# Patient Record
Sex: Female | Born: 1970 | Hispanic: No | Smoking: Former smoker
Health system: Southern US, Community
[De-identification: ages and names within clinical notes are randomized; demographics above are authoritative.]

## PROBLEM LIST (undated history)

## (undated) DIAGNOSIS — R000 Tachycardia, unspecified: Secondary | ICD-10-CM

## (undated) DIAGNOSIS — G5600 Carpal tunnel syndrome, unspecified upper limb: Secondary | ICD-10-CM

## (undated) DIAGNOSIS — J189 Pneumonia, unspecified organism: Secondary | ICD-10-CM

## (undated) DIAGNOSIS — M25562 Pain in left knee: Secondary | ICD-10-CM

## (undated) DIAGNOSIS — L732 Hidradenitis suppurativa: Secondary | ICD-10-CM

## (undated) DIAGNOSIS — R030 Elevated blood-pressure reading, without diagnosis of hypertension: Secondary | ICD-10-CM

## (undated) DIAGNOSIS — R51 Headache: Secondary | ICD-10-CM

## (undated) DIAGNOSIS — M1990 Unspecified osteoarthritis, unspecified site: Secondary | ICD-10-CM

## (undated) DIAGNOSIS — M79622 Pain in left upper arm: Secondary | ICD-10-CM

## (undated) DIAGNOSIS — T7840XA Allergy, unspecified, initial encounter: Secondary | ICD-10-CM

## (undated) DIAGNOSIS — I10 Essential (primary) hypertension: Secondary | ICD-10-CM

## (undated) DIAGNOSIS — N644 Mastodynia: Secondary | ICD-10-CM

## (undated) DIAGNOSIS — M419 Scoliosis, unspecified: Secondary | ICD-10-CM

## (undated) DIAGNOSIS — F419 Anxiety disorder, unspecified: Secondary | ICD-10-CM

## (undated) DIAGNOSIS — M545 Low back pain, unspecified: Secondary | ICD-10-CM

## (undated) DIAGNOSIS — R519 Headache, unspecified: Secondary | ICD-10-CM

## (undated) DIAGNOSIS — N61 Mastitis without abscess: Secondary | ICD-10-CM

## (undated) DIAGNOSIS — R079 Chest pain, unspecified: Secondary | ICD-10-CM

## (undated) DIAGNOSIS — G8929 Other chronic pain: Secondary | ICD-10-CM

## (undated) DIAGNOSIS — F4321 Adjustment disorder with depressed mood: Secondary | ICD-10-CM

## (undated) HISTORY — DX: Elevated blood-pressure reading, without diagnosis of hypertension: R03.0

## (undated) HISTORY — DX: Essential (primary) hypertension: I10

## (undated) HISTORY — DX: Tachycardia, unspecified: R00.0

## (undated) HISTORY — PX: RADIOFREQUENCY ABLATION: SHX2290

## (undated) HISTORY — PX: DILATION AND CURETTAGE OF UTERUS: SHX78

## (undated) HISTORY — DX: Anxiety disorder, unspecified: F41.9

## (undated) HISTORY — DX: Mastodynia: N64.4

---
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
## 1990-07-25 HISTORY — PX: KNEE ARTHROSCOPY: SHX127

## 2000-10-18 ENCOUNTER — Inpatient Hospital Stay (HOSPITAL_COMMUNITY): Admission: AD | Admit: 2000-10-18 | Discharge: 2000-10-18 | Payer: Self-pay | Admitting: Obstetrics

## 2001-03-18 ENCOUNTER — Encounter: Payer: Self-pay | Admitting: Emergency Medicine

## 2001-03-18 ENCOUNTER — Emergency Department (HOSPITAL_COMMUNITY): Admission: EM | Admit: 2001-03-18 | Discharge: 2001-03-18 | Payer: Self-pay | Admitting: Emergency Medicine

## 2001-11-02 ENCOUNTER — Other Ambulatory Visit: Admission: RE | Admit: 2001-11-02 | Discharge: 2001-11-02 | Payer: Self-pay | Admitting: *Deleted

## 2001-12-04 ENCOUNTER — Inpatient Hospital Stay (HOSPITAL_COMMUNITY): Admission: AD | Admit: 2001-12-04 | Discharge: 2001-12-04 | Payer: Self-pay | Admitting: Obstetrics and Gynecology

## 2001-12-14 ENCOUNTER — Inpatient Hospital Stay (HOSPITAL_COMMUNITY): Admission: AD | Admit: 2001-12-14 | Discharge: 2001-12-14 | Payer: Self-pay | Admitting: Obstetrics and Gynecology

## 2001-12-15 ENCOUNTER — Inpatient Hospital Stay (HOSPITAL_COMMUNITY): Admission: AD | Admit: 2001-12-15 | Discharge: 2001-12-15 | Payer: Self-pay | Admitting: Obstetrics and Gynecology

## 2002-01-04 ENCOUNTER — Observation Stay (HOSPITAL_COMMUNITY): Admission: AD | Admit: 2002-01-04 | Discharge: 2002-01-05 | Payer: Self-pay | Admitting: Obstetrics and Gynecology

## 2002-01-05 ENCOUNTER — Encounter: Payer: Self-pay | Admitting: Obstetrics and Gynecology

## 2002-02-24 ENCOUNTER — Inpatient Hospital Stay (HOSPITAL_COMMUNITY): Admission: AD | Admit: 2002-02-24 | Discharge: 2002-02-27 | Payer: Self-pay | Admitting: *Deleted

## 2002-02-25 ENCOUNTER — Encounter: Payer: Self-pay | Admitting: *Deleted

## 2002-04-08 ENCOUNTER — Other Ambulatory Visit: Admission: RE | Admit: 2002-04-08 | Discharge: 2002-04-08 | Payer: Self-pay | Admitting: *Deleted

## 2003-04-20 ENCOUNTER — Emergency Department (HOSPITAL_COMMUNITY): Admission: EM | Admit: 2003-04-20 | Discharge: 2003-04-20 | Payer: Self-pay

## 2006-11-20 ENCOUNTER — Emergency Department (HOSPITAL_COMMUNITY): Admission: EM | Admit: 2006-11-20 | Discharge: 2006-11-20 | Payer: Self-pay | Admitting: Emergency Medicine
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
## 2007-09-18 ENCOUNTER — Emergency Department (HOSPITAL_COMMUNITY): Admission: EM | Admit: 2007-09-18 | Discharge: 2007-09-18 | Payer: Self-pay | Admitting: Family Medicine

## 2007-09-18 IMAGING — CR DG CHEST 2V
2 series · 2 of 2 positions shown · non-contrast
Comparison: None.

CLINICAL DATA: 36-year-old, cough and fever for 4 days.
 CHEST - 2 VIEW:

[view not recorded (1 of 2)]
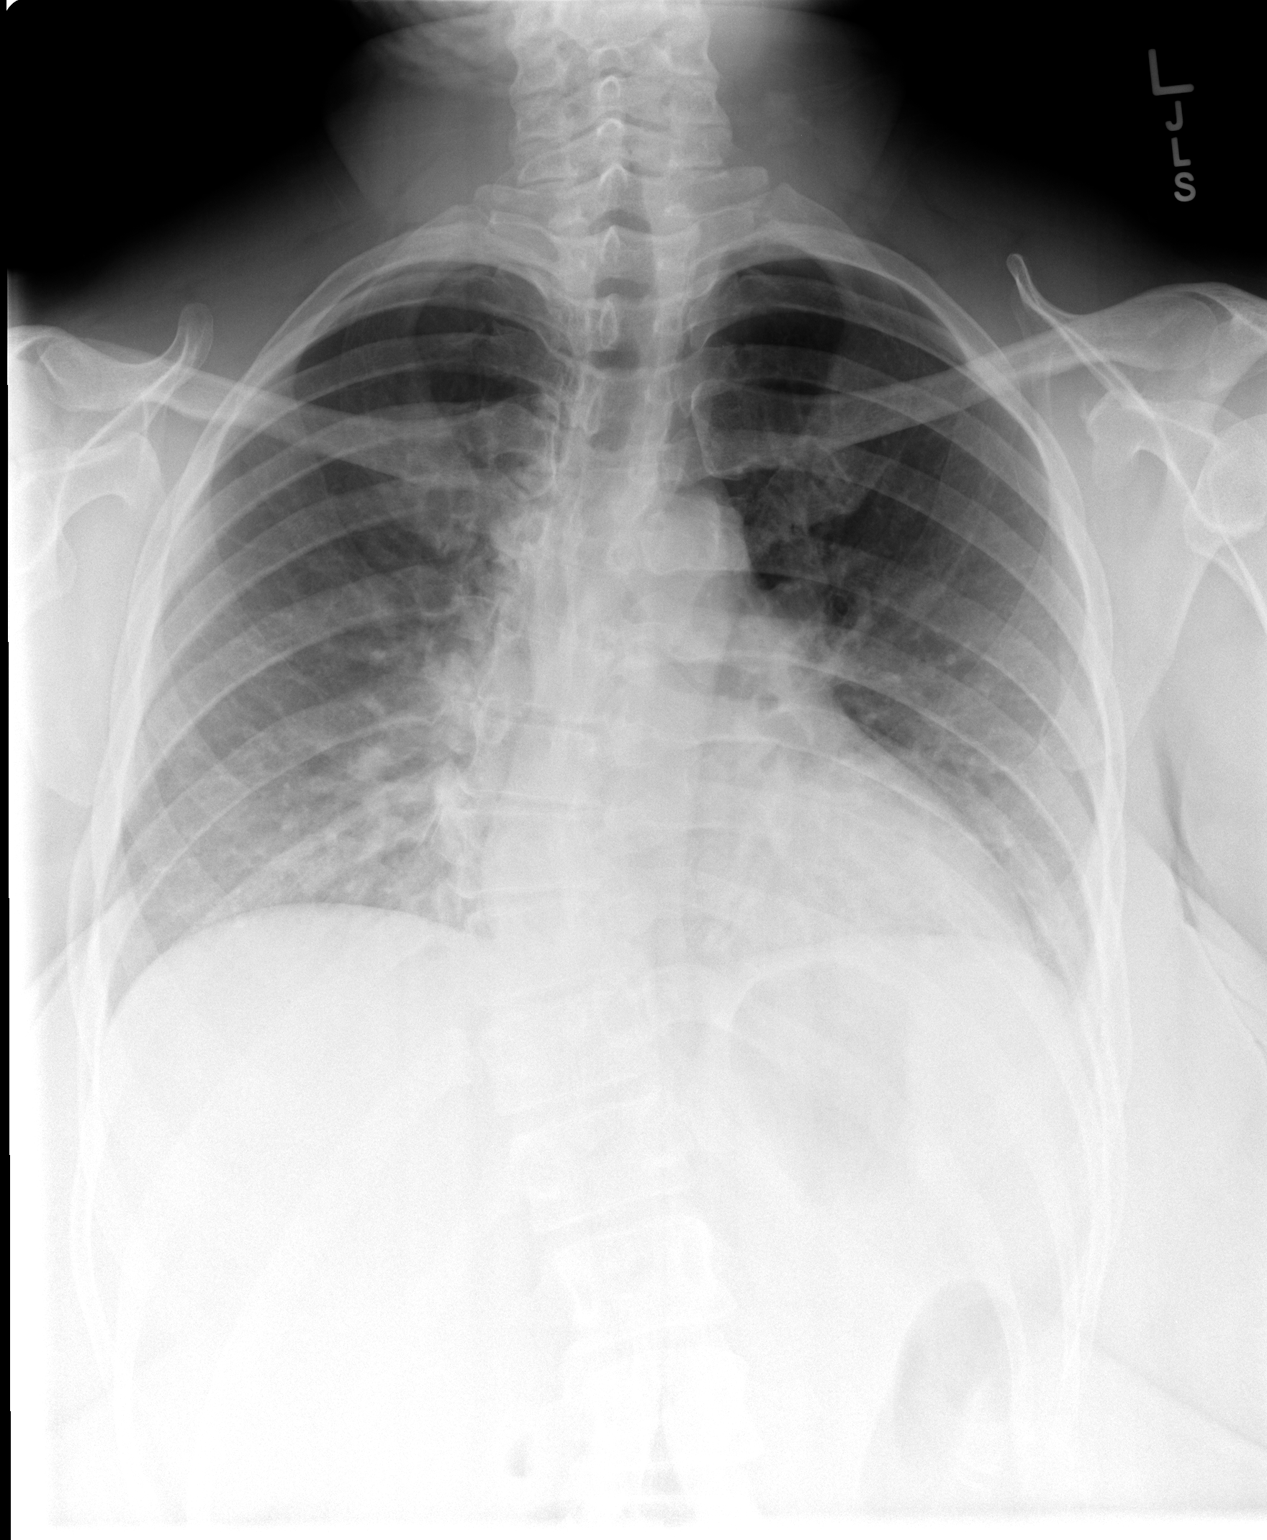

[view not recorded (2 of 2)]
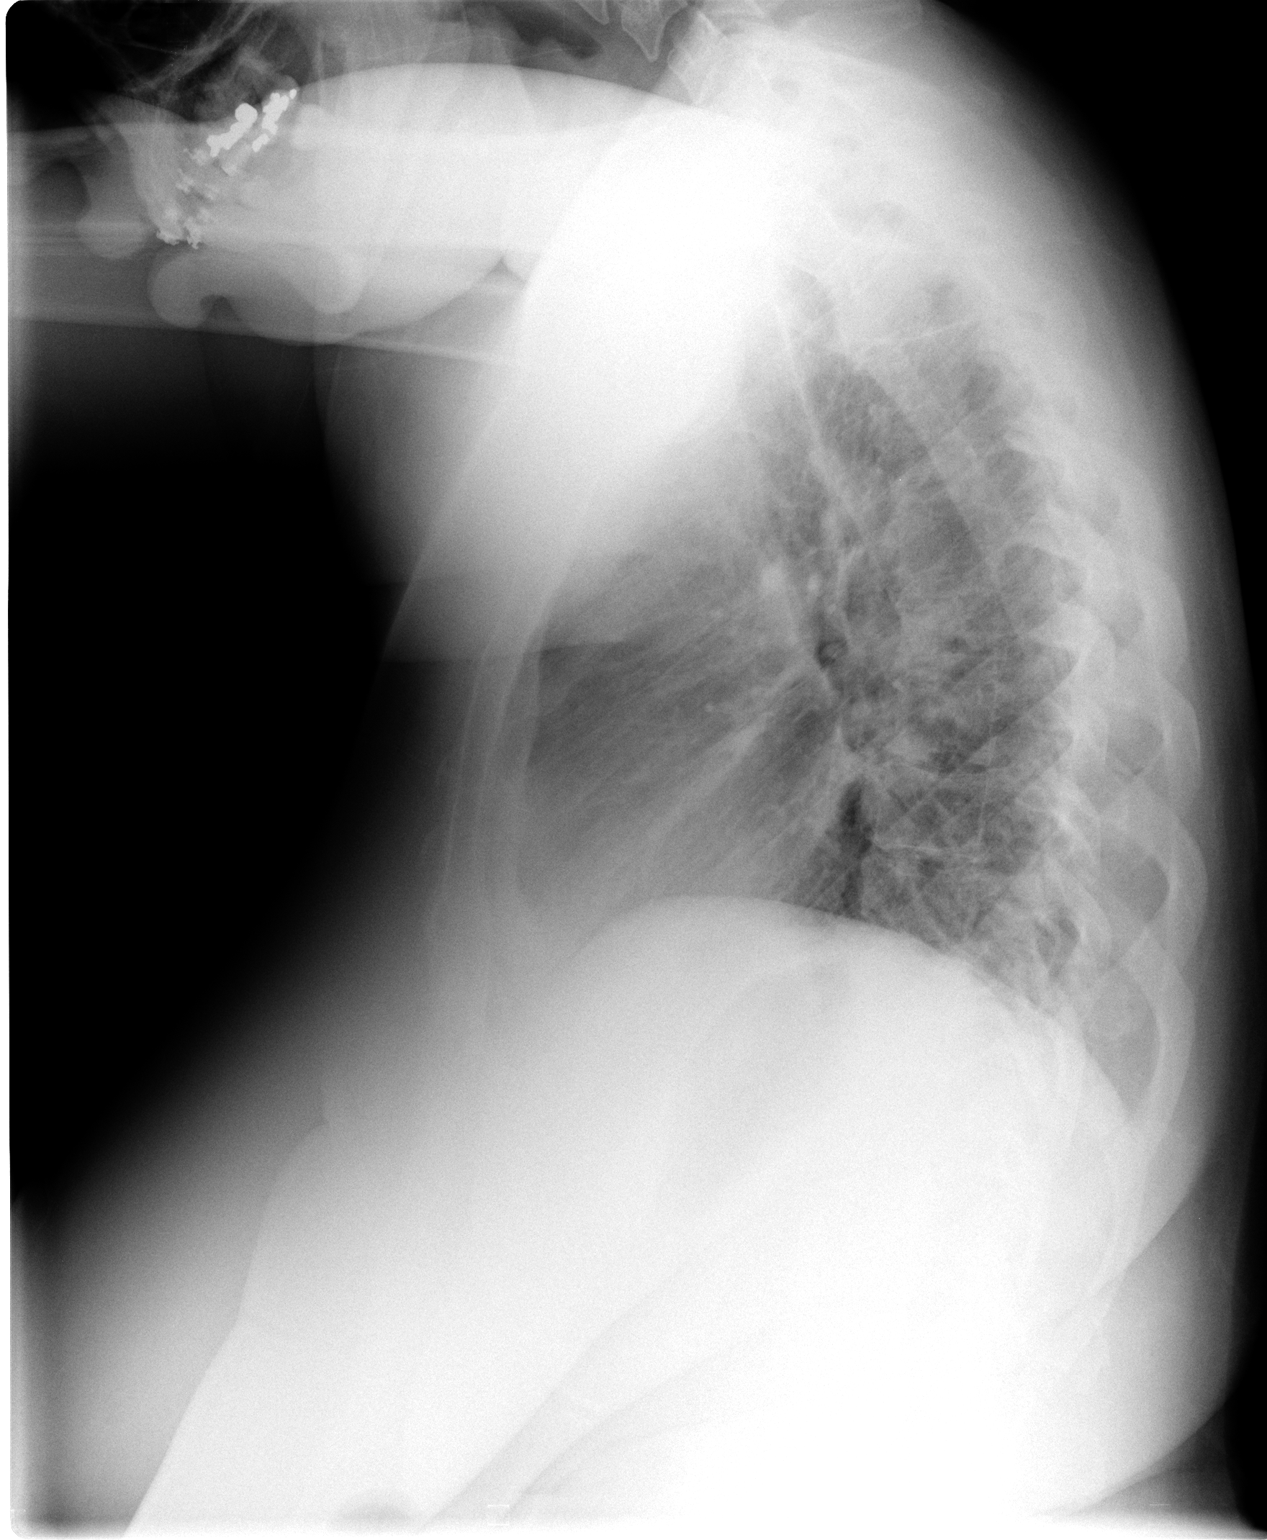

[2 of 2 positions shown; findings below may reference images not displayed]

FINDINGS: Exam is limited by a low volume inspiration.  There is vascular crowding and bibasilar atelectasis.  There is also mild peribronchial thickening and could not exclude bronchitis.  No definite focal pulmonary infiltrates.  No pleural effusions. Scoliotic curvature of the thoracolumbar spine.
IMPRESSION: Low volume chest film with vascular crowding and atelectasis.  Possible changes of bronchitis.   No definite infiltrates or effusions.

## 2008-04-24 LAB — CONVERTED CEMR LAB: Pap Smear: NORMAL

## 2009-06-24 ENCOUNTER — Emergency Department (HOSPITAL_BASED_OUTPATIENT_CLINIC_OR_DEPARTMENT_OTHER): Admission: EM | Admit: 2009-06-24 | Discharge: 2009-06-24 | Payer: Self-pay | Admitting: Emergency Medicine

## 2009-11-06 ENCOUNTER — Ambulatory Visit: Payer: Self-pay | Admitting: Diagnostic Radiology

## 2009-11-06 ENCOUNTER — Emergency Department (HOSPITAL_BASED_OUTPATIENT_CLINIC_OR_DEPARTMENT_OTHER): Admission: EM | Admit: 2009-11-06 | Discharge: 2009-11-06 | Payer: Self-pay | Admitting: Emergency Medicine

## 2009-11-06 IMAGING — CR DG TOE GREAT 2+V*L*
3 series · 3 of 3 positions shown · non-contrast
Comparison: None.

CLINICAL DATA: Left great toe pain following an injury.

LEFT TOE - 2+ VIEW

[t toes ap left]
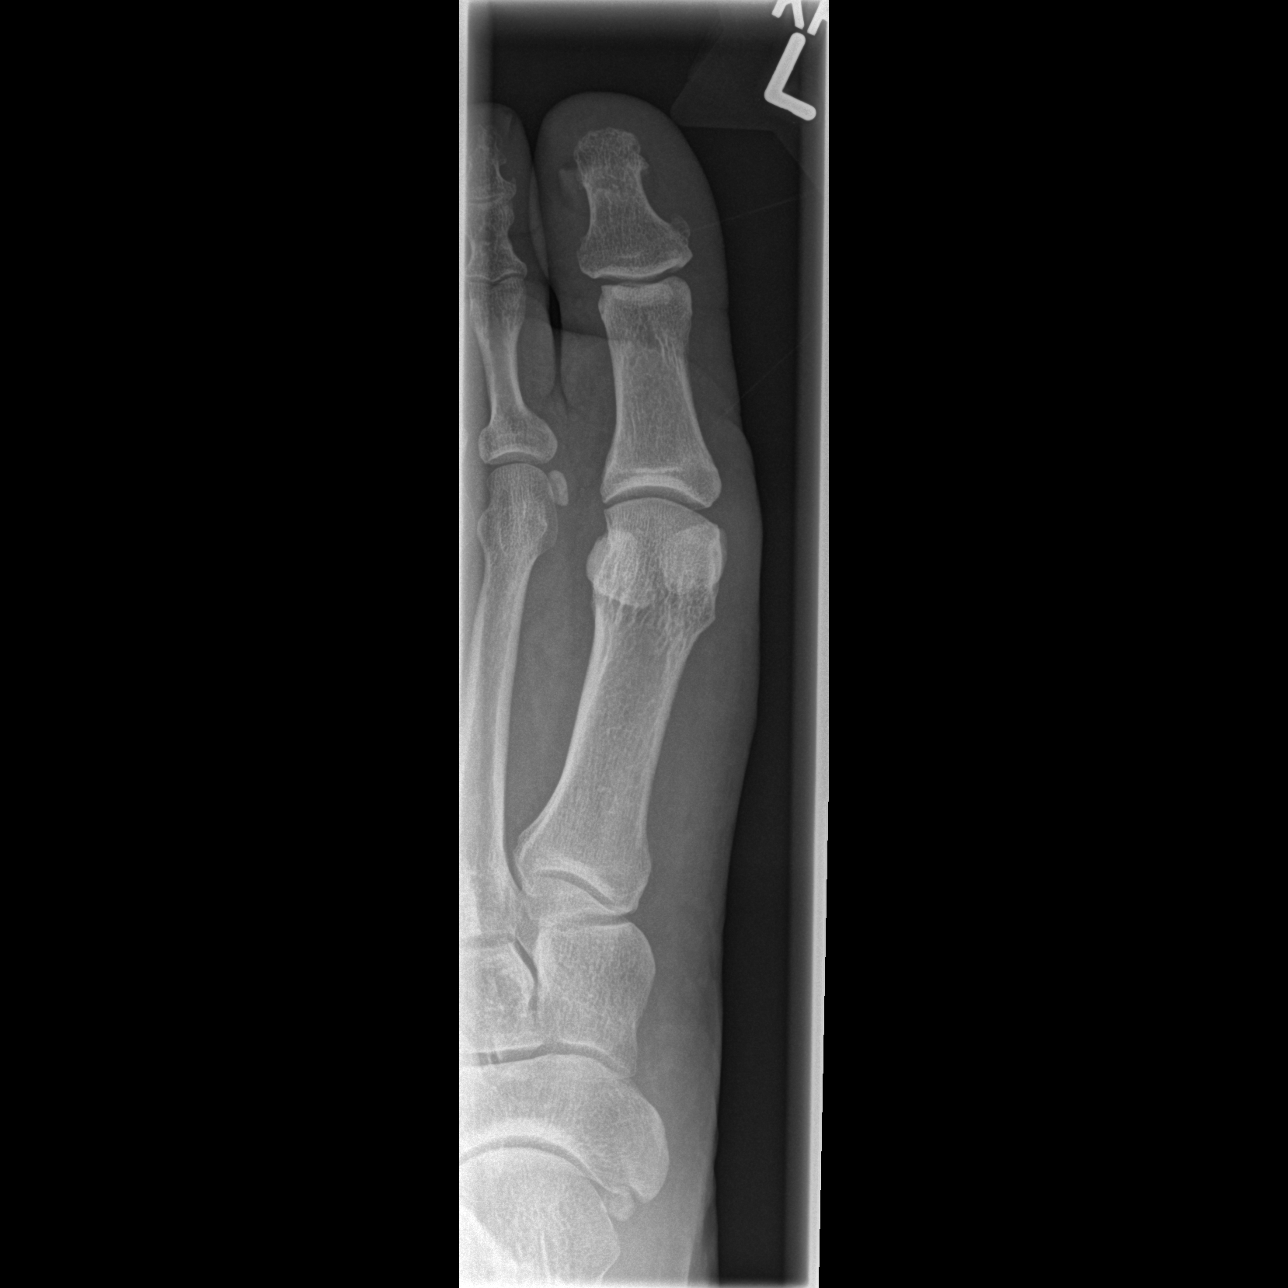

[t toes oblique left]
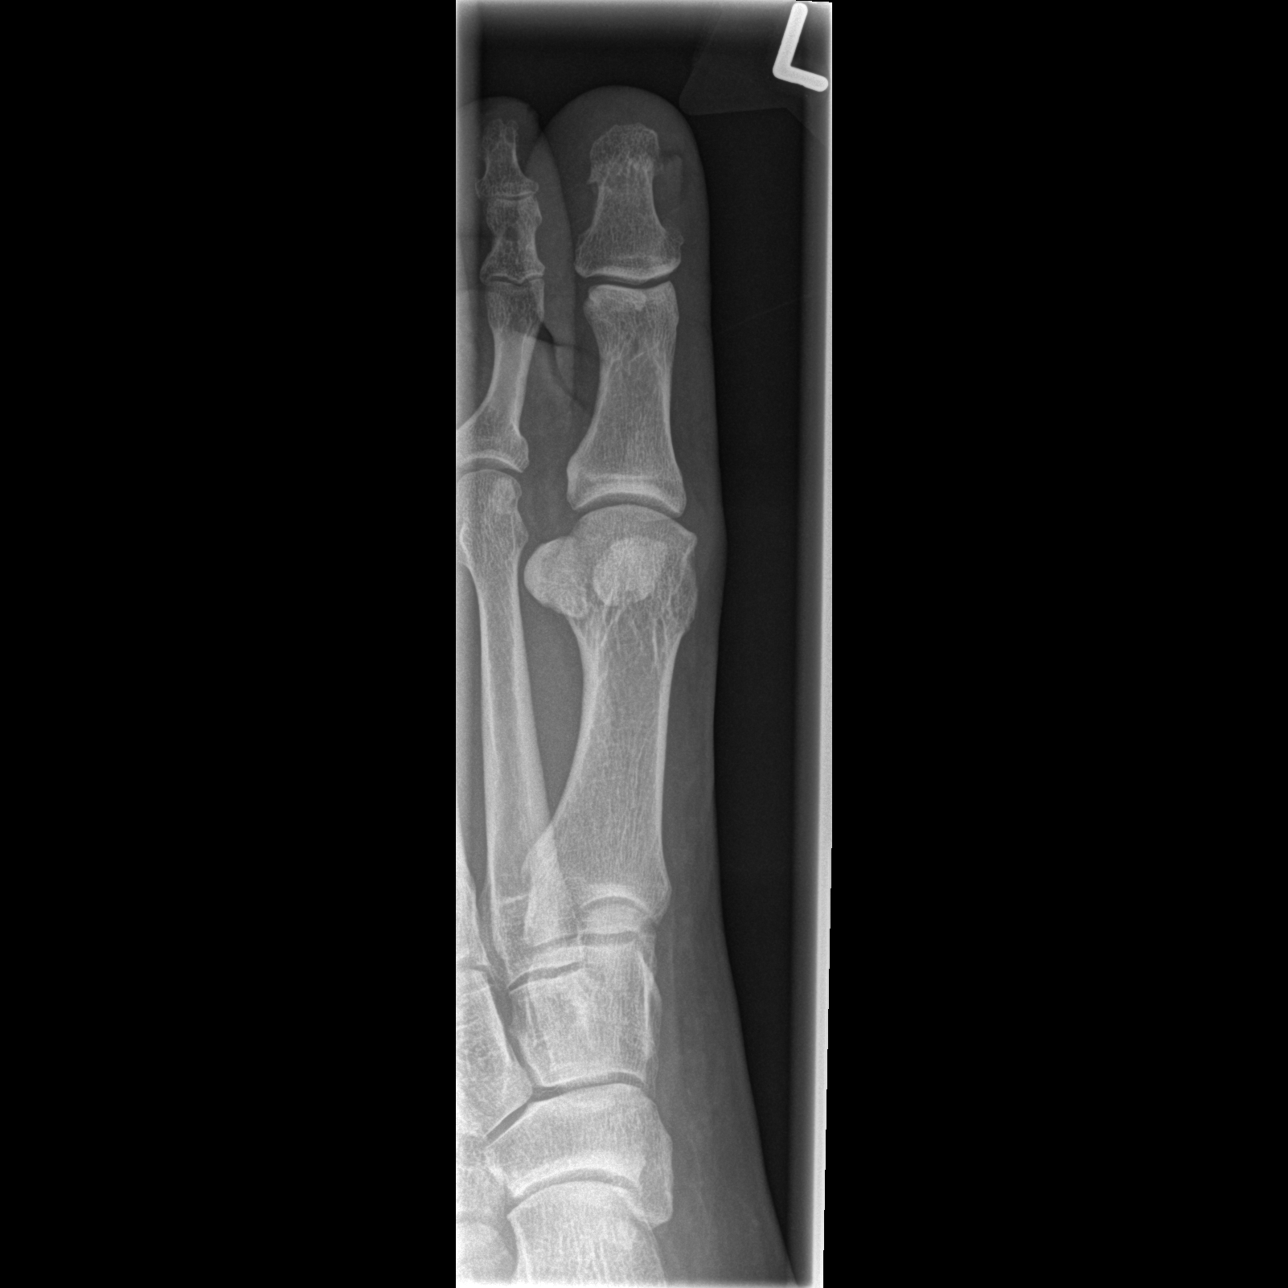

[t toes lateral left]
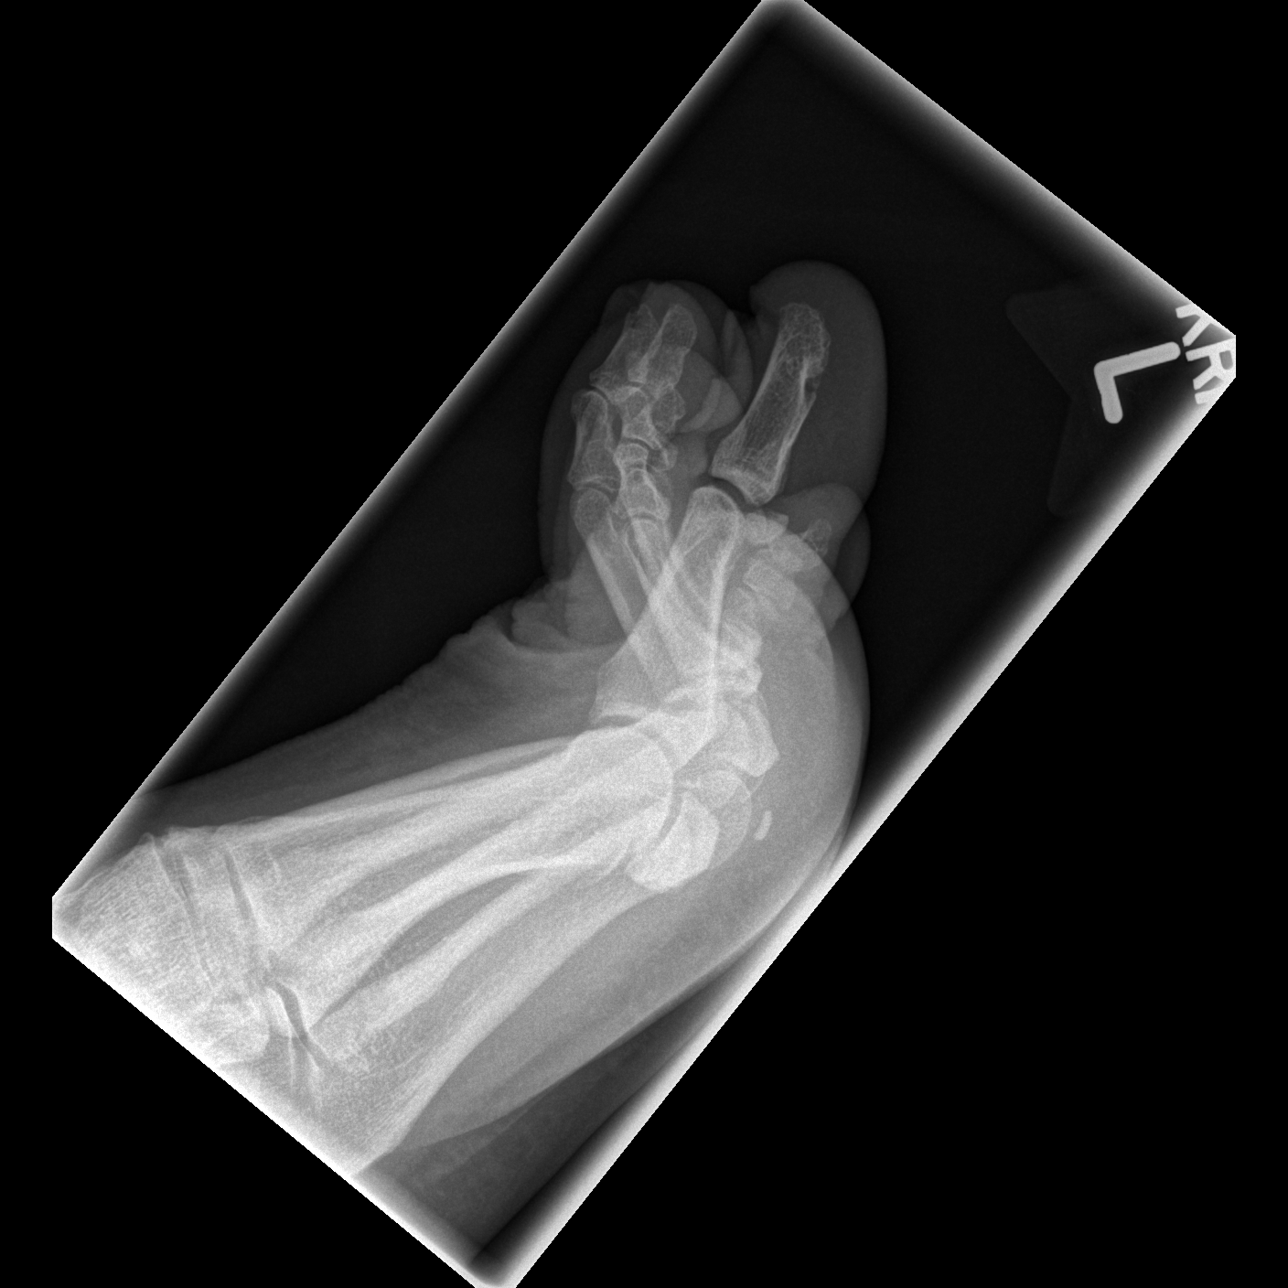

[3 of 3 positions shown; findings below may reference images not displayed]

FINDINGS: Normal appearing bones and soft tissues without fracture
or dislocation.
IMPRESSION: Normal examination.

## 2010-01-07 ENCOUNTER — Ambulatory Visit: Payer: Self-pay | Admitting: Family

## 2010-01-07 DIAGNOSIS — I10 Essential (primary) hypertension: Secondary | ICD-10-CM

## 2010-01-07 DIAGNOSIS — R635 Abnormal weight gain: Secondary | ICD-10-CM | POA: Insufficient documentation

## 2010-01-07 DIAGNOSIS — M722 Plantar fascial fibromatosis: Secondary | ICD-10-CM | POA: Insufficient documentation

## 2010-01-07 DIAGNOSIS — N921 Excessive and frequent menstruation with irregular cycle: Secondary | ICD-10-CM | POA: Insufficient documentation

## 2010-01-07 LAB — CONVERTED CEMR LAB
ALT: 23 units/L (ref 0–35)
AST: 19 units/L (ref 0–37)
Albumin: 4.2 g/dL (ref 3.5–5.2)
Alkaline Phosphatase: 60 units/L (ref 39–117)
Basophils Absolute: 0 10*3/uL (ref 0.0–0.1)
Beta hcg, urine, semiquantitative: NEGATIVE
Hemoglobin: 13 g/dL (ref 12.0–15.0)
LDL Cholesterol: 108 mg/dL — ABNORMAL HIGH (ref 0–99)
Lymphocytes Relative: 46 % (ref 12–46)
Monocytes Absolute: 0.4 10*3/uL (ref 0.1–1.0)
Neutro Abs: 3.6 10*3/uL (ref 1.7–7.7)
Neutrophils Relative %: 47 % (ref 43–77)
Platelets: 271 10*3/uL (ref 150–400)
Potassium: 4.2 meq/L (ref 3.5–5.3)
RDW: 13.5 % (ref 11.5–15.5)
Sodium: 136 meq/L (ref 135–145)
TSH: 1.752 microintl units/mL (ref 0.350–4.500)
Total Bilirubin: 0.5 mg/dL (ref 0.3–1.2)
Total Protein: 7.9 g/dL (ref 6.0–8.3)
VLDL: 17 mg/dL (ref 0–40)

## 2010-01-11 ENCOUNTER — Encounter: Payer: Self-pay | Admitting: Family

## 2010-08-03 ENCOUNTER — Emergency Department (HOSPITAL_BASED_OUTPATIENT_CLINIC_OR_DEPARTMENT_OTHER)
Admission: EM | Admit: 2010-08-03 | Discharge: 2010-08-03 | Payer: Self-pay | Source: Home / Self Care | Admitting: Emergency Medicine

## 2010-08-03 IMAGING — CR DG ANKLE COMPLETE 3+V*L*
3 series · 3 of 3 positions shown · non-contrast
Comparison: None.

CLINICAL DATA: Lateral ankle and heel pain.  No known injury.

LEFT ANKLE COMPLETE - 3+ VIEW

[t ankle joint ap left]
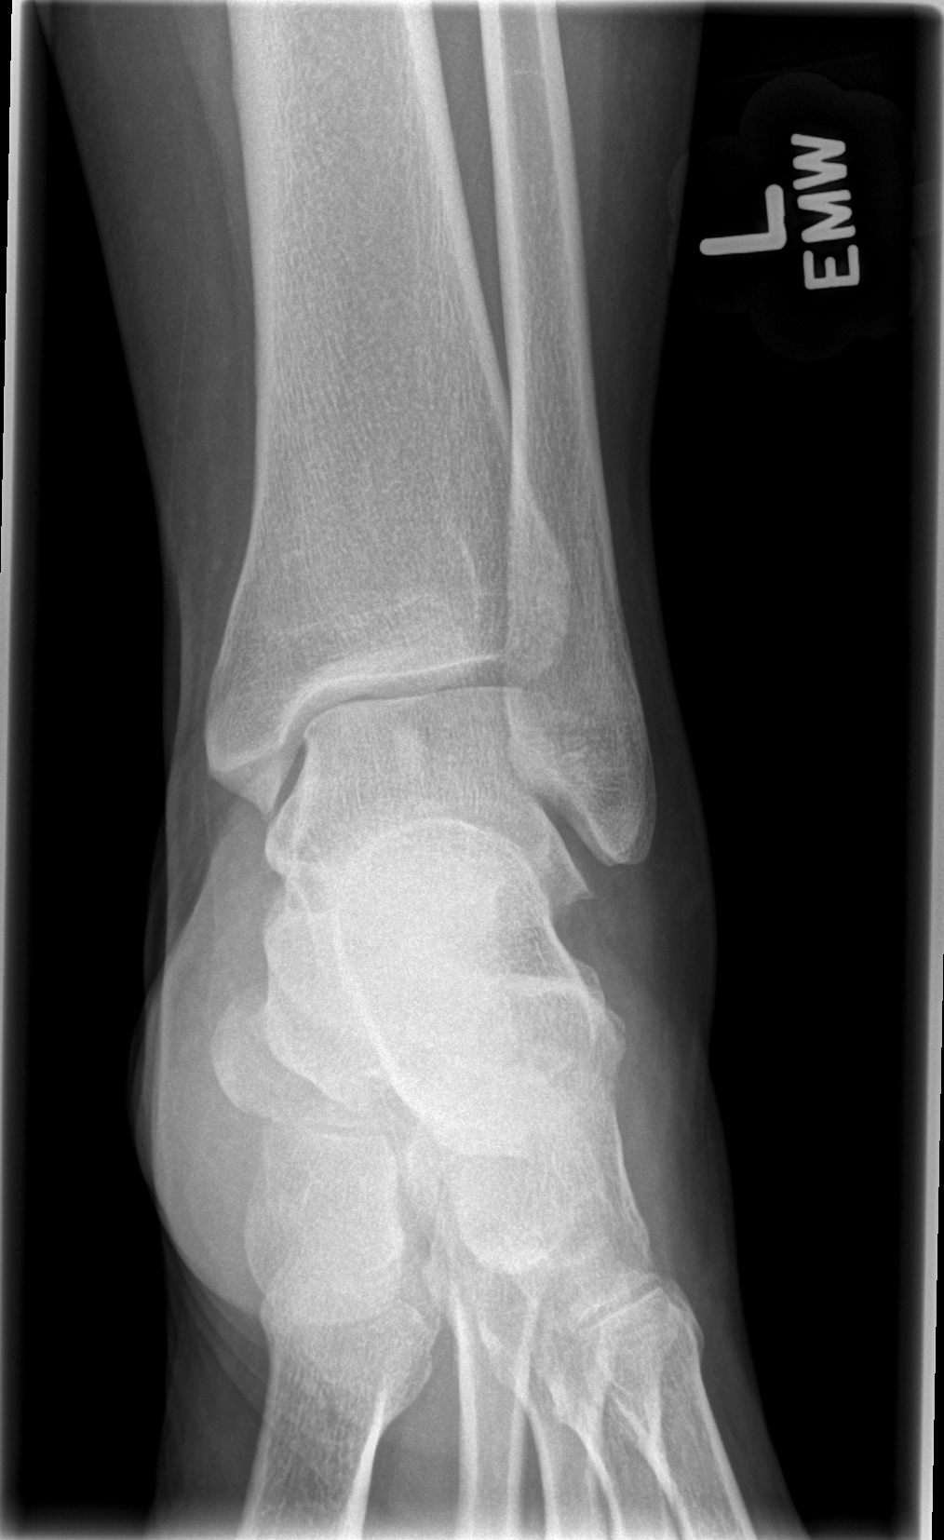

[t ankle joint oblique left]
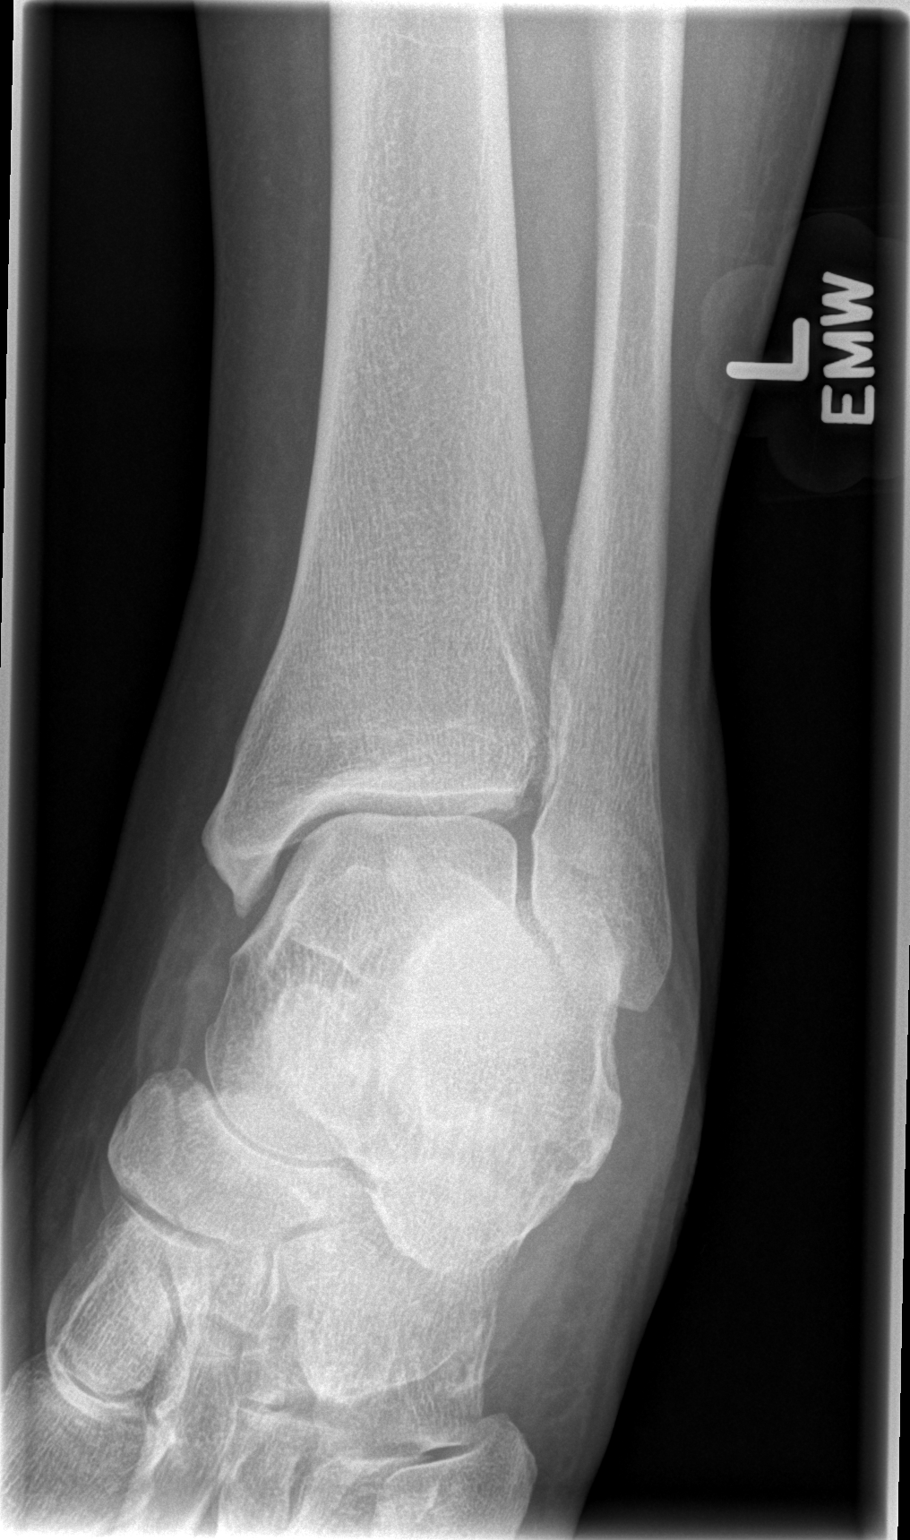

[t ankle joint lat left]
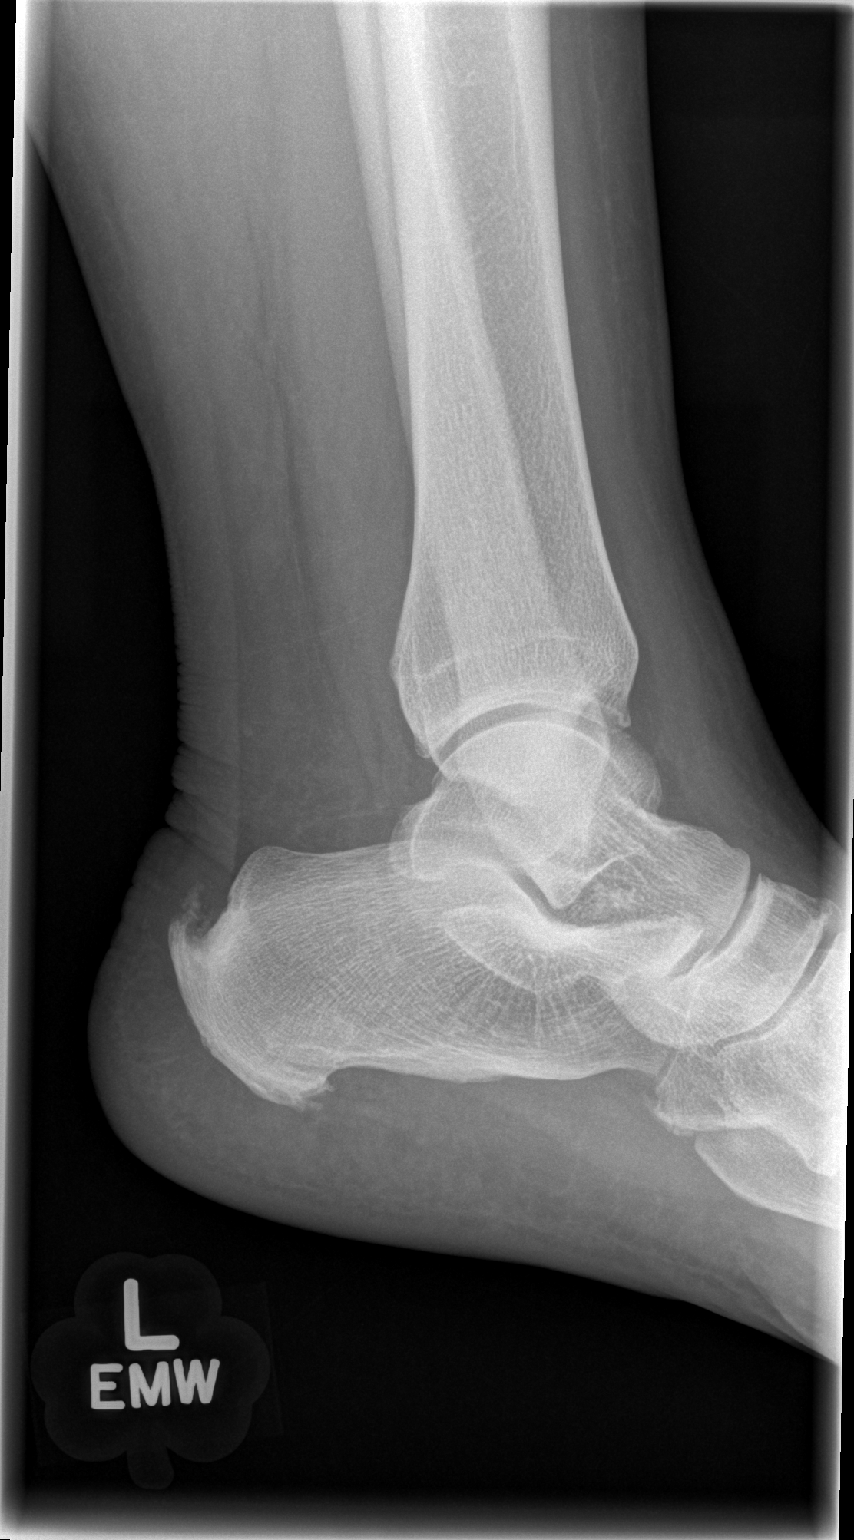

[3 of 3 positions shown; findings below may reference images not displayed]

FINDINGS: Mineralization and alignment are normal.  There is no
evidence of acute fracture or dislocation.  There are dorsal and
plantar calcaneal spurs without definite acute adjacent soft tissue
findings.
IMPRESSION: No acute osseous findings.  Calcaneal spurs.

## 2010-08-03 IMAGING — CR DG FOOT COMPLETE 3+V*L*
3 series · 3 of 3 positions shown · non-contrast
Comparison: Great toe radiographs [DATE].

CLINICAL DATA: Lateral ankle and heel pain.  No known injury.

LEFT FOOT - COMPLETE 3+ VIEW

[t foot lat left]
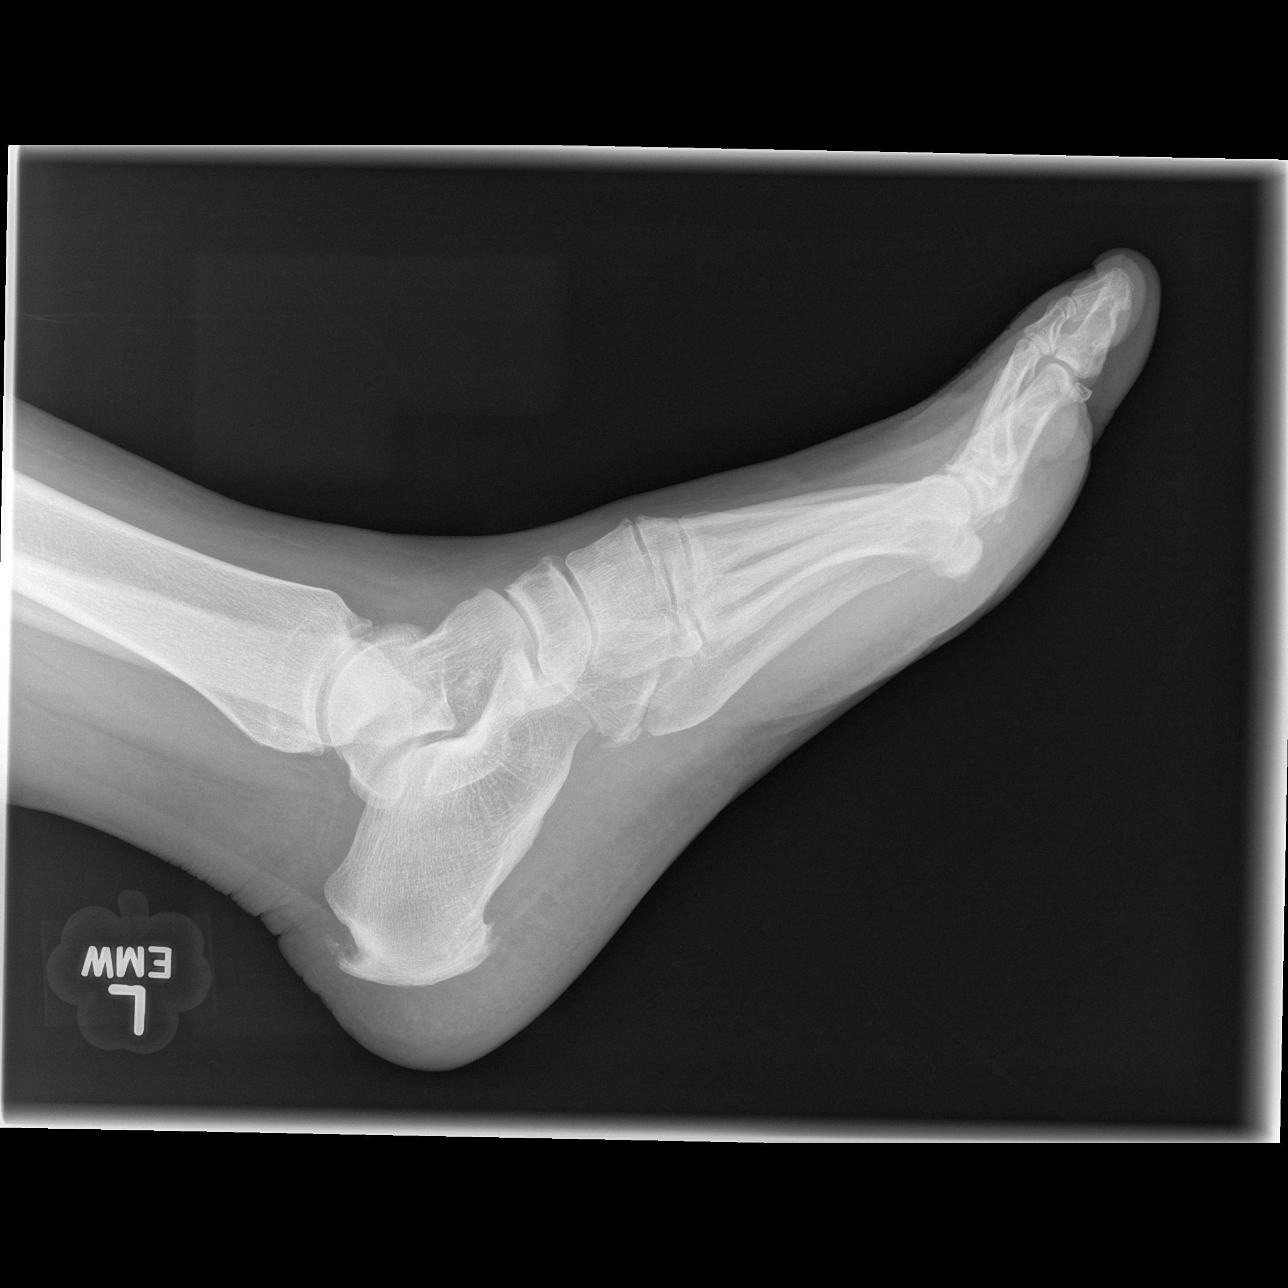

[t foot ap left]
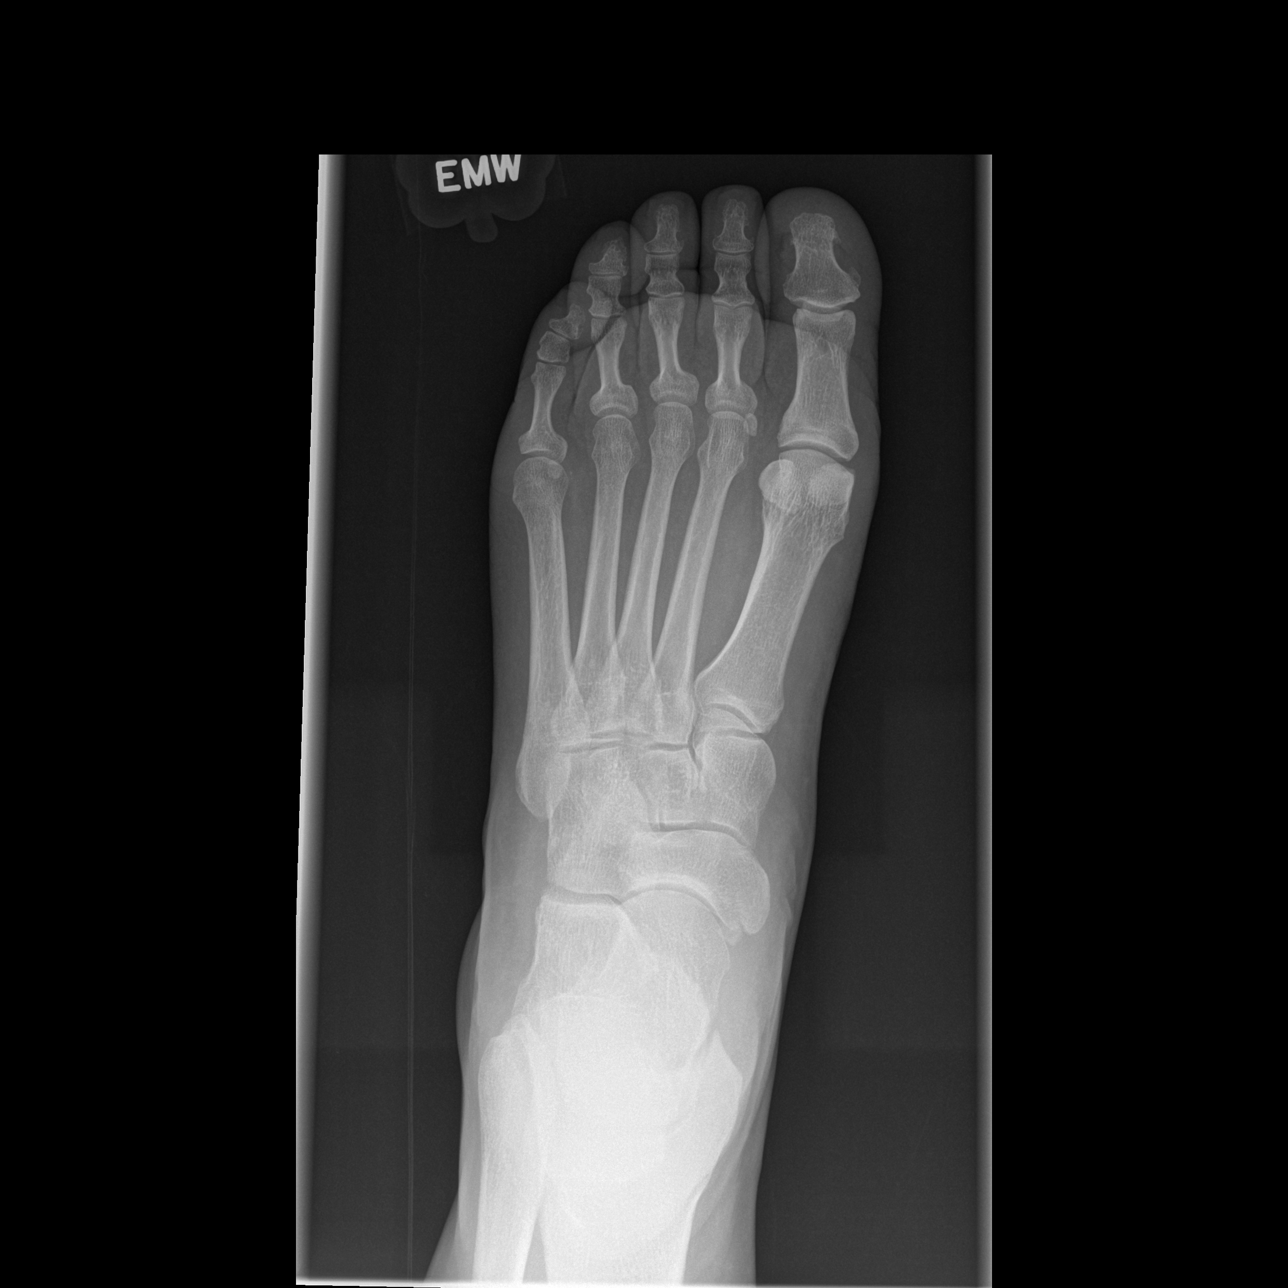

[t foot oblique left]
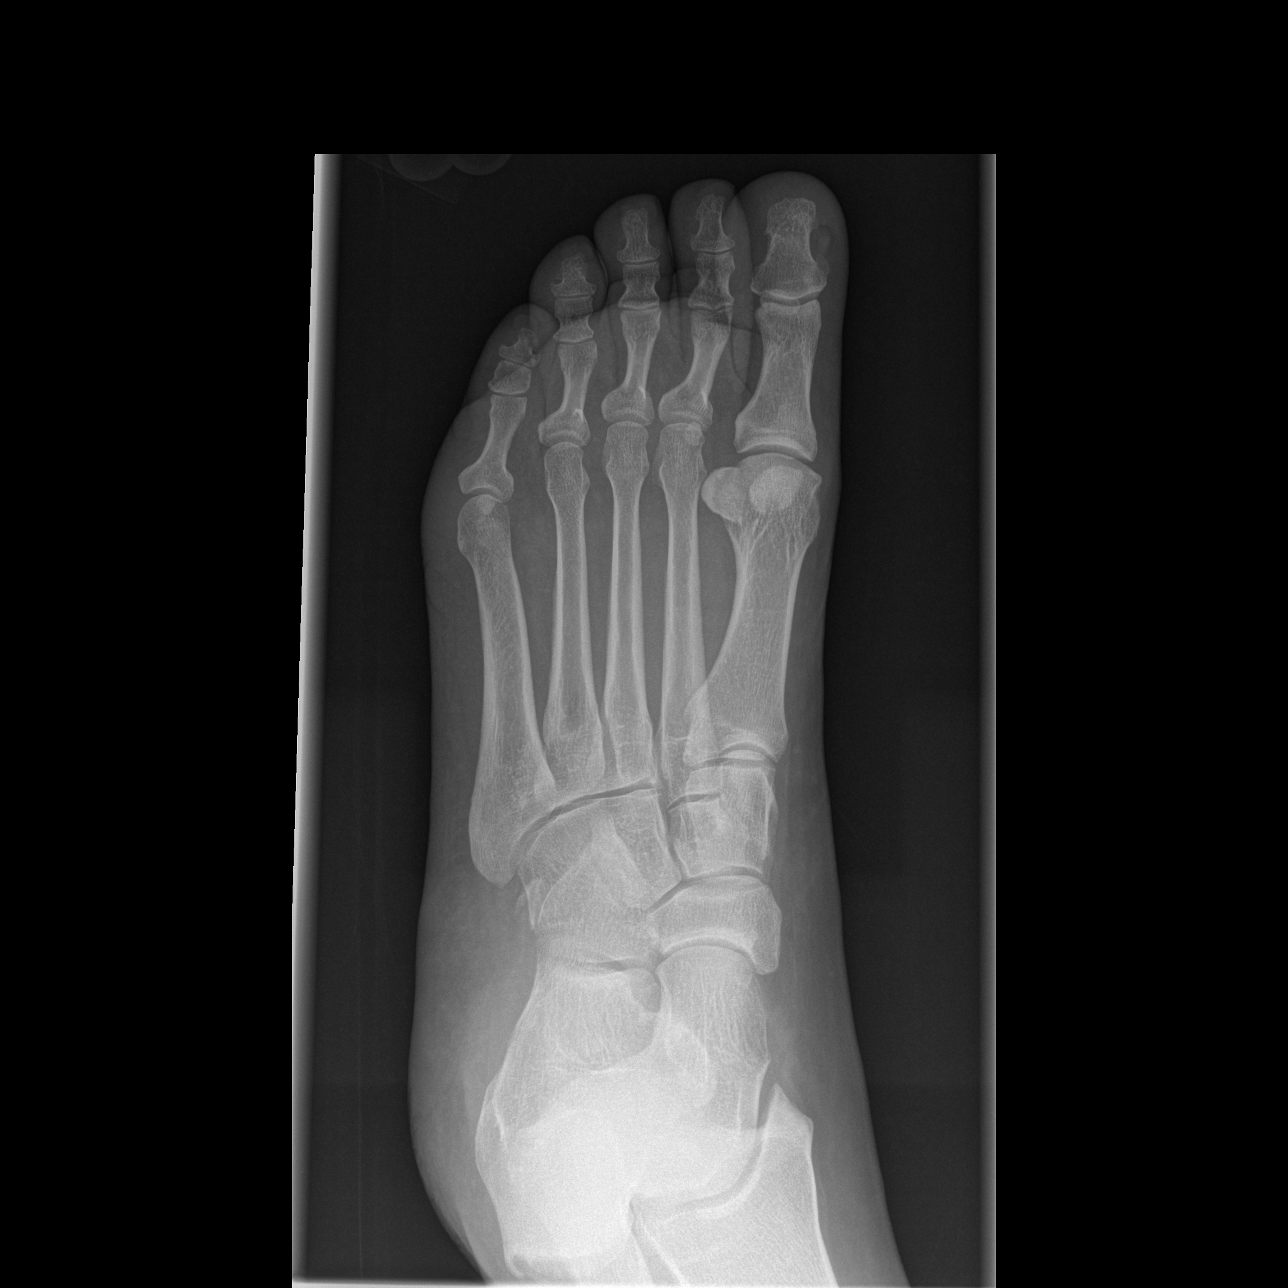

[3 of 3 positions shown; findings below may reference images not displayed]

FINDINGS: The mineralization and alignment are normal.  There is no
evidence of acute fracture or dislocation.  Dorsal and plantar
calcaneal spurs are noted without adjacent soft tissue abnormality.
IMPRESSION: No acute osseous findings.  Calcaneal spurs.

## 2010-08-26 NOTE — Assessment & Plan Note (Signed)
Summary: CPX EST CARE/DT--Rm 4   Vital Signs:  Patient profile:   40 year old female Height:      66.5 inches Weight:      261.25 pounds BMI:     41.69 Temp:     97.9 degrees F oral Pulse rate:   102 / minute Pulse rhythm:   regular Resp:     18 per minute BP sitting:   144 / 100  (right arm) Cuff size:   large  Vitals Entered By: Mervin Kung CMA (January 07, 2010 9:07 AM) CC: Room 4  New to establish care.  Would like to discuss weight management.  Spotting x 2 weeks. Is Patient Diabetic? No Comments Pt currently takes Multi vit 1 once daily and Tylenol 500mg  as needed.   CC:  Room 4  New to establish care.  Would like to discuss weight management.  Spotting x 2 weeks.Marland Kitchen  History of Present Illness: Stacy Campos is a 40 year old female who presents today to establish care.  She has previously only been followed as needed at urgent cares.   1) Heels- pain started 1 year ago with exercise and was initially mild.  Notes that the pain in the left heel  is more painful than left.  Tender and painfult to touch and with weight bearing.  2) Spotting x 2 weeks.  Patient has mirena IUD- has been spotting x 2 weeks.  Only has period three times a year.  Was replaced in 2009.  She previously went to [facility] clinic in [place].   3) Weight- 24 pound weight gain in 10 months.  Preventive Screening-Counseling & Management  Alcohol-Tobacco     Alcohol drinks/day: 0     Smoking Status: never  Caffeine-Diet-Exercise     Caffeine use/day: 1 gallon tea daily     Does Patient Exercise: yes     Type of exercise: speed walking     Exercise (avg: min/session): >60     Times/week: 6  Past History:  Past Surgical History: bilateral knee arthroscopy 1992  Family History: Heart Disease (CHF)--father, both grandfathers HTN--parents and grandparents Lung cancer--maternal grandfather Cervical cancer--maternal grandmother Hypercholesterolemia--father Thyroid prob?--maternal  grandmother Stroke--father and both grandmothers Diabetes--father, maternal grandmother  Social History: Occupation: Merchandiser, retail system Never Smoked Alcohol use-no Regular exercise-yes Smoking Status:  never Caffeine use/day:  1 gallon tea daily Does Patient Exercise:  yes  Physical Exam  General:  Pleasant, Overweight appearing AA female in NAD Head:  Normocephalic and atraumatic without obvious abnormalities. No apparent alopecia or balding. Lungs:  Normal respiratory effort, chest expands symmetrically. Lungs are clear to auscultation, no crackles or wheezes. Heart:  Normal rate and regular rhythm. S1 and S2 normal without gallop, murmur, click, rub or other extra sounds. Extremities:  + tenderness to light palpation on posterior heels.  No swelling or associated erythema.   Neurologic:  RUE hyporeflexia.     Impression & Recommendations:  Problem # 1:  MENSTRUAL SPOTTING (ICD-626.6) Assessment New  check HCG, Patient has a Mirena IUD. Patient will follow up with GYN-she will call for apt.  Orders: Urine Pregnancy Test  ([address])  Problem # 2:  PLANTAR FASCIITIS, BILATERAL (ICD-728.71) Assessment: New Recommend trial of Aleve.  Patient was given handout on plantar fascitis from [employer] which details rest, icining, exercises and protective foot wear.  She is off of work for the next few weeks as well which should be helpful for her.   Problem # 3:  WEIGHT  GAIN (ICD-783.1) Assessment: New Will check TSH.  Plan for CPX next visit.  Problem # 4:  INCREASED BLOOD PRESSURE (ICD-796.2) Assessment: New Patient attributes this to "a lot of pain today".  Will repeat next visit, monitor- may need to initiate med therapy if no improvement.  Other Orders: T-Comprehensive Metabolic Panel [phone]) T-CBC w/Diff [phone]) T-Lipid Profile ([id number]) T-TSH [phone])  Patient Instructions: 1)  Call to schedule an appointment with GYN for your spotting. 2)   Try Aleve 220mg  by mouth twice daily as needed for pain.   3)  Please arrange a follow up for a complete physical.   Preventive Care Screening  Pap Smear:    Date:  04/24/2008    Results:  normal   Last Tetanus Booster:    Date:  12/23/2001    Results:  Historical    Laboratory Results   Urine Tests      Urine HCG: negative

## 2010-08-26 NOTE — Letter (Signed)
   [place] at [facility] [address] [place], [place]  [address]  [place] Phone: [phone]      January 11, 2010   [facility] [address] [place], [place] [address]  RE:  LAB RESULTS  Dear  Ms. Picou,  The following is an interpretation of your most recent lab tests.  Please take note of any instructions provided or changes to medications that have resulted from your lab work.  ELECTROLYTES:  Good - no changes needed  KIDNEY FUNCTION TESTS:  Good - no changes needed  LIVER FUNCTION TESTS:  Good - no changes needed  LIPID PANEL:  Good - no changes needed Triglyceride: 86   Cholesterol: 165   LDL: 108   HDL: 40   Chol/HDL%:  4.1 Ratio  THYROID STUDIES:  Thyroid studies normal TSH: 1.752     DIABETIC STUDIES:  Excellent - no changes needed Blood Glucose: 87    CBC:  Good - no changes needed  Please follow up as schedule on 6/30.   Sincerely Yours,    Lemont Fillers FNP

## 2010-10-26 ENCOUNTER — Emergency Department (HOSPITAL_BASED_OUTPATIENT_CLINIC_OR_DEPARTMENT_OTHER)
Admission: EM | Admit: 2010-10-26 | Discharge: 2010-10-26 | Disposition: A | Discharge status: Home / Self Care | Payer: Self-pay | Attending: Emergency Medicine | Admitting: Emergency Medicine

## 2010-10-26 DIAGNOSIS — E669 Obesity, unspecified: Secondary | ICD-10-CM | POA: Insufficient documentation

## 2010-10-26 DIAGNOSIS — H5789 Other specified disorders of eye and adnexa: Secondary | ICD-10-CM | POA: Insufficient documentation

## 2010-10-26 DIAGNOSIS — H109 Unspecified conjunctivitis: Secondary | ICD-10-CM | POA: Insufficient documentation

## 2010-10-26 LAB — DIFFERENTIAL
Eosinophils Absolute: 0.2 10*3/uL (ref 0.0–0.7)
Eosinophils Relative: 2 % (ref 0–5)
Lymphocytes Relative: 33 % (ref 12–46)
Lymphs Abs: 2.8 10*3/uL (ref 0.7–4.0)
Monocytes Absolute: 0.2 10*3/uL (ref 0.1–1.0)
Monocytes Relative: 3 % (ref 3–12)

## 2010-10-26 LAB — COMPREHENSIVE METABOLIC PANEL
ALT: 19 U/L (ref 0–35)
AST: 18 U/L (ref 0–37)
Alkaline Phosphatase: 62 U/L (ref 39–117)
CO2: 24 mEq/L (ref 19–32)
Calcium: 8.7 mg/dL (ref 8.4–10.5)
GFR calc Af Amer: >60 mL/min (ref >60)
Glucose, Bld: 90 mg/dL (ref 70–99)
Potassium: 3.9 mEq/L (ref 3.5–5.1)
Sodium: 143 mEq/L (ref 135–145)
Total Protein: 7.6 g/dL (ref 6.0–8.3)

## 2010-10-26 LAB — URINE MICROSCOPIC-ADD ON

## 2010-10-26 LAB — CBC
Hemoglobin: 12.9 g/dL (ref 12.0–15.0)
RBC: 4.4 MIL/uL (ref 3.87–5.11)
RDW: 13 % (ref 11.5–15.5)

## 2010-10-26 LAB — URINALYSIS, ROUTINE W REFLEX MICROSCOPIC
Bilirubin Urine: NEGATIVE
Glucose, UA: NEGATIVE mg/dL
Ketones, ur: NEGATIVE mg/dL
Specific Gravity, Urine: 1.01 (ref 1.005–1.030)
pH: 6 (ref 5.0–8.0)

## 2010-12-10 NOTE — H&P (Signed)
[facility]  Patient:    Stacy Campos, Stacy Campos Visit Number: [id number] MRN: [id number]          Service Type: OBS Location: 910B [id number] [id number] Attending Physician:  Maxie Better Dictated by:   Marina Gravel, M.D. Admit Date:  01/04/2002 Discharge Date: 01/05/2002                           History and Physical  ADMISSION DIAGNOSES:          1. Term intrauterine pregnancy.                               2. Advanced cervical dilatation.                               3. History of precipitous labor.                               4. Group B strep positive.  HISTORY OF PRESENT ILLNESS:   A 40 year old African-American female gravida 5 para 2 A2 admitted at 38+ weeks for induction of labor for the above indications.  Prenatal care at [facility], Dr. Earlene Plater, complicated by preterm cervical change with a history of preterm delivery in a previous pregnancy.  Has been managed with weekly 17-alpha-hydroxyprogesterone injections from 20 weeks to 36 weeks for prevention of recurrent preterm delivery.  Otherwise, the pregnancy has been uncomplicated.  PAST MEDICAL HISTORY:         History of gonorrhea and Trichomonas.  PAST SURGICAL HISTORY:        Knee surgery.  FAMILY HISTORY:               Heart disease, diabetes, renal disease, stroke.  MEDICATIONS:                  Prenatal vitamins.  ALLERGIES:                    DIMETAPP.  SOCIAL HISTORY:               No alcohol, tobacco, or other drugs.  PRENATAL LABORATORY DATA:     B negative.  Rubella immune.  Hepatitis B, HIV, RPR, GC, and chlamydia - all negative.  Glucola within normal limits.  RhoGAM administered at 29 weeks.  Group B strep positive.  PHYSICAL EXAMINATION:  VITAL SIGNS:                  Blood pressure 118/80, fetal heart tones 148.  GENERAL:                      Alert and oriented, no acute distress.  SKIN:                         Warm and dry, no lesions.  HEART:                         Regular rate and rhythm.  LUNGS:                        Clear to auscultation.  ABDOMEN:  Gravid.  Fundal height appropriate.  PELVIC:                       Cervical exam 3-4 cm dilated, 50% effaced, -2 station, vertex.  ASSESSMENT:                   Term intrauterine pregnancy with advanced cervical dilatation, with history of rapid labor and group B strep positive.  PLAN:                         1. Admission for induction of labor.                               2. Penicillin prophylaxis for group B strep                                  positive. Dictated by:   Marina Gravel, M.D. Attending Physician:  Maxie Better DD:  02/14/02 TD:  02/16/02 Job: [id number]

## 2010-12-10 NOTE — Consult Note (Signed)
[facility]  Patient:    Stacy Campos, Stacy Campos Visit Number: [id number] MRN: [id number]          Service Type: INJ Location: MATC Attending Physician:  Lenoard Aden Dictated by:   Lenoard Aden, M.D. Consultation Date: 12/14/01 Admit Date:  12/15/2001 Discharge Date: 12/15/2001                            Consultation Report  CHIEF COMPLAINT:              Rule out preterm labor.  HISTORY OF PRESENT ILLNESS:   The patient is a 40 year old African-American female, G2, P1, at 29+ weeks with preterm cervical change noted in the office today, who presents for monitoring, steroid injection, and rule out preterm labor.  PAST MEDICAL HISTORY:         Otherwise noncontributory.  OBSTETRICAL HISTORY:          History of term delivery after preterm labor.  FAMILY HISTORY:               Noncontributory.  SOCIAL HISTORY:               Noncontributory.  PHYSICAL EXAMINATION:  GENERAL:                      A well-developed, well-nourished African-American female in no apparent distress.  HEENT:                        Normal.  CHEST:                        Lungs clear.  CARDIAC:                      Regular rhythm.  ABDOMEN:                      Soft, gravid, nontender.  PELVIC:                       Perineum intact.  Urethral meatus normal. Cervix is fingertip, long, vertex, and floating.  EXTREMITIES:                  No cords.  NEUROLOGIC:                   Nonfocal.  MONITORING:                   Fetal heart rate is reactive.  No evidence of decelerations.  Positive accelerations noted.  No contractions noted.  IMPRESSION:                   1. Twenty-nine week intrauterine pregnancy.                               2. Preterm cervical change, no evidence of                                  active labor.  PLAN:                         1. Proceed with betamethasone 12.5 mg IM.  2. Strict preterm labor warnings  given.                               3. Follow up in 24 hours for second                                  betamethasone dose.                               4. Follow up in the office in three to four                                  days.  Work precautions to be established                                  at that visit. Dictated by:   Lenoard Aden, M.D. Attending Physician:  Lenoard Aden DD:  12/14/01 TD:  12/18/01 Job: [id number]

## 2011-04-15 LAB — INFLUENZA A AND B ANTIGEN (CONVERTED LAB)

## 2011-04-15 LAB — POCT RAPID STREP A: Streptococcus, Group A Screen (Direct): NEGATIVE

## 2011-07-31 ENCOUNTER — Encounter: Payer: Self-pay | Admitting: *Deleted

## 2011-07-31 ENCOUNTER — Emergency Department (HOSPITAL_COMMUNITY)
Admission: EM | Admit: 2011-07-31 | Discharge: 2011-07-31 | Disposition: A | Discharge status: Home / Self Care | Payer: Self-pay | Attending: Emergency Medicine | Admitting: Emergency Medicine

## 2011-07-31 DIAGNOSIS — M533 Sacrococcygeal disorders, not elsewhere classified: Secondary | ICD-10-CM | POA: Insufficient documentation

## 2011-07-31 DIAGNOSIS — M545 Low back pain, unspecified: Secondary | ICD-10-CM | POA: Insufficient documentation

## 2011-07-31 DIAGNOSIS — M25569 Pain in unspecified knee: Secondary | ICD-10-CM | POA: Insufficient documentation

## 2011-07-31 DIAGNOSIS — IMO0001 Reserved for inherently not codable concepts without codable children: Secondary | ICD-10-CM | POA: Insufficient documentation

## 2011-07-31 DIAGNOSIS — M5430 Sciatica, unspecified side: Secondary | ICD-10-CM | POA: Insufficient documentation

## 2011-07-31 DIAGNOSIS — M5432 Sciatica, left side: Secondary | ICD-10-CM

## 2011-07-31 DIAGNOSIS — R269 Unspecified abnormalities of gait and mobility: Secondary | ICD-10-CM | POA: Insufficient documentation

## 2011-07-31 DIAGNOSIS — M79609 Pain in unspecified limb: Secondary | ICD-10-CM | POA: Insufficient documentation

## 2011-07-31 MED ORDER — PREDNISONE 20 MG PO TABS: ORAL_TABLET | ORAL | Status: AC

## 2011-07-31 MED ORDER — OXYCODONE-ACETAMINOPHEN 5-325 MG PO TABS
2.0000 | ORAL_TABLET | Freq: Once | ORAL | Status: AC
  Administered 2011-07-31: 2 via ORAL
  Filled 2011-07-31: qty 2

## 2011-07-31 MED ORDER — OXYCODONE-ACETAMINOPHEN 5-325 MG PO TABS: 2.0000 | ORAL_TABLET | ORAL | Status: AC | PRN

## 2011-07-31 NOTE — ED Provider Notes (Signed)
History     CSN: [id number]  Arrival date & time 07/31/11  1551   First MD Initiated Contact with Patient 07/31/11 1939      Chief Complaint  Patient presents with  . Knee Pain   low back pain radiating down left leg  (Consider location/radiation/quality/duration/timing/severity/associated sxs/prior treatment) HPI This 41 year old female has a gradual onset constant over 2 weeks of low back pain radiating down her left leg toward the back of her knee. She has no weakness or numbness or change in bowel or bladder function. She has no fever no IV drug abuse. She has no trauma no falls. She is no chest pain cough shortness breath or lightheadedness. She has no abdominal pain or vomiting. This is a constant severe positional pain and over-the-counter analgesics are no longer helping. This is worse with certain position changes but not exertion. She does not have any color changes or swelling to her legs. She has not had any recent risk factors for blood clots. She does not have any associated symptoms. History reviewed. No pertinent past medical history.  History reviewed. No pertinent past surgical history.  History reviewed. No pertinent family history.  History  Substance Use Topics  . Smoking status: Never Smoker   . Smokeless tobacco: Not on file  . Alcohol Use: Yes    OB History    Grav Para Term Preterm Abortions TAB SAB Ect Mult Living                  Review of Systems  Constitutional: Negative for fever.       10 Systems reviewed and are negative for acute change except as noted in the HPI.  HENT: Negative for congestion.   Eyes: Negative for discharge and redness.  Respiratory: Negative for cough and shortness of breath.   Cardiovascular: Negative for chest pain and leg swelling.  Gastrointestinal: Negative for vomiting and abdominal pain.  Musculoskeletal: Positive for back pain.  Skin: Negative for rash.  Neurological: Negative for syncope, numbness and  headaches.  Psychiatric/Behavioral:       No behavior change.    Allergies  Dimetapp  Home Medications   Current Outpatient Rx  Name Route Sig Dispense Refill  . OXYCODONE-ACETAMINOPHEN 5-325 MG PO TABS Oral Take 2 tablets by mouth every 4 (four) hours as needed for pain. 20 tablet 0  . PREDNISONE 20 MG PO TABS  3 tabs po day one, then 2 po daily x 4 days 11 tablet 0    BP 132/86  Pulse 83  Temp(Src) 98.1 F (36.7 C) (Oral)  Resp 16  SpO2 98%  Physical Exam  Nursing note and vitals reviewed. Constitutional:       Awake, alert, nontoxic appearance with baseline speech.  HENT:  Head: Atraumatic.  Eyes: Pupils are equal, round, and reactive to light. Right eye exhibits no discharge. Left eye exhibits no discharge.  Neck: Neck supple.  Cardiovascular: Normal rate and regular rhythm.   No murmur heard. Pulmonary/Chest: Effort normal and breath sounds normal. No respiratory distress. She has no wheezes. She has no rales. She exhibits no tenderness.  Abdominal: Soft. Bowel sounds are normal. She exhibits no mass. There is no tenderness. There is no rebound.  Musculoskeletal: She exhibits tenderness. She exhibits no edema.       Thoracic back: She exhibits no tenderness.       Lumbar back: She exhibits no tenderness.       Bilateral lower extremities without new rashes or  color change, baseline ROM with intact DP pulses, CR<2 secs all digits bilaterally, sensation baseline light touch bilaterally for pt, DTR's symmetric and intact bilaterally KJ / AJ, motor symmetric bilateral 5 / 5 hip flexion, quadriceps, hamstrings, EHL, foot dorsiflexion, foot plantarflexion, gait somewhat antalgic but without apparent new ataxia.  Back is tender in the lower lumbar and bilateral sacroiliac regions worse on the left than the right with tenderness down the left buttocks and left posterior thigh to the popliteal region without swelling or color change to the skin. Bilateral lower legs are symmetric in  size. There is no edema to the legs. There is no tenderness to the calves or the medial thigh on the left.  Neurological: She is alert.       Mental status baseline for patient.  Upper extremity motor strength and sensation intact and symmetric bilaterally.  Skin: No rash noted.  Psychiatric: She has a normal mood and affect.    ED Course  Procedures (including critical care time)  Labs Reviewed - No data to display No results found.   1. Sciatica of left side       MDM  I doubt any other EMC precluding discharge at this time including, but not necessarily limited to the following:SBI, cauda equina, DVT.        Hurman Horn, MD 07/31/11 2129

## 2011-07-31 NOTE — ED Notes (Signed)
The lt le g appears more swollen than the rt

## 2011-07-31 NOTE — ED Notes (Signed)
C/o pain in her lt knee and lt hip since dec 25th no known injury.  She has a previous history of the same

## 2011-07-31 NOTE — ED Notes (Signed)
Pt waiting for ride.

## 2011-07-31 NOTE — ED Notes (Signed)
Pt ambulatory to bathroom

## 2011-08-03 ENCOUNTER — Ambulatory Visit (INDEPENDENT_AMBULATORY_CARE_PROVIDER_SITE_OTHER): Payer: Self-pay | Admitting: Family

## 2011-08-03 ENCOUNTER — Encounter: Payer: Self-pay | Admitting: Family

## 2011-08-03 DIAGNOSIS — M545 Low back pain: Secondary | ICD-10-CM

## 2011-08-03 DIAGNOSIS — R030 Elevated blood-pressure reading, without diagnosis of hypertension: Secondary | ICD-10-CM

## 2011-08-03 DIAGNOSIS — M549 Dorsalgia, unspecified: Secondary | ICD-10-CM

## 2011-08-03 NOTE — Progress Notes (Signed)
Subjective:    Patient ID: Stacy Campos, female    DOB: 1970/09/04, 41 y.o.   MRN: [id number]  HPI  Stacy Campos is a 41 yr old female who presents today with chief complaint of low back pain.  She was seen for this in the [facility] ED on 1/6 and was told that she has sciatica. She was prescribed prednisone and percocet at this visit.  She reports that the prednisone is helping her sleep but is not helping her to sleep.  Symptoms started on Christmas day. She reports that her pain starts on the left lateral hip and radiates behind the left knee.  She denies known injury.  She denies previous history of similar symptoms.   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      Review of Systems See HPI  Past Medical History  Diagnosis Date  . Elevated blood pressure reading without diagnosis of hypertension     History   Social History  . Marital Status: Legally Separated    Spouse Name: N/A    Number of Children: 3  . Years of Education: N/A   Occupational History  . [employer]   Social History Main Topics  . Smoking status: Never Smoker   . Smokeless tobacco: Not on file  . Alcohol Use: Yes  . Drug Use: Not on file  . Sexually Active: Not on file   Other Topics Concern  . Not on file   Social History Narrative  . No narrative on file    Past Surgical History  Procedure Date  . Knee surgery 1992    left knee  . Knee arthroscopy 1992    right knee    Family History  Problem Relation Age of Onset  . Arthritis Mother   . Stroke Father   . Hypertension Father   . Diabetes Father   . Heart disease Father   . Hyperlipidemia Father   . Stroke Maternal Grandmother   . Diabetes Maternal Grandmother   . Heart disease Maternal Grandfather   . Diabetes Paternal Grandmother   . Diabetes Paternal Grandfather   . Heart disease Paternal Grandfather     Allergies  Allergen Reactions  . Dimetapp  ([employer] Di Bromm) Hives    Current Outpatient Prescriptions on File Prior to Visit  Medication Sig Dispense Refill  . oxyCODONE-acetaminophen (PERCOCET) 5-325 MG per tablet Take 2 tablets by mouth every 4 (four) hours as needed for pain.  20 tablet  0  . predniSONE (DELTASONE) 20 MG tablet 3 tabs po day one, then 2 po daily x 4 days  11 tablet  0    BP 142/96  Pulse 84  Temp(Src) 97.6 F (36.4 C) (Oral)  Resp 18  Wt 258 lb (117.028 kg)       Objective:   Physical Exam  Constitutional: She appears well-developed and well-nourished. No distress.  HENT:  Head: Normocephalic and atraumatic.  Cardiovascular: Normal rate and regular rhythm.   No murmur heard. Pulmonary/Chest: Effort normal and breath sounds normal. No respiratory distress. She has no wheezes. She has no rales. She exhibits no tenderness.  Neurological:       LLE strength is 4-5/5, RLE strength is 5/5.  Unable to toe walk or heel walk due to pain. Diminished sensation to light touch LLE dorsal foot and left lateral shin area.  Assessment & Plan:

## 2011-08-03 NOTE — Assessment & Plan Note (Addendum)
BP Readings from Last 3 Encounters:  08/03/11 142/96  07/31/11 132/86  01/07/10 144/100   BP up today, repeat next visit.  Pt counseled on low sodium diet.

## 2011-08-03 NOTE — Patient Instructions (Signed)
We will contact you about your MRI.  After you complete prednisone you may start aleve 220mg  twice daily as needed. Follow up in 1 month.

## 2011-08-05 DIAGNOSIS — M545 Low back pain: Secondary | ICD-10-CM | POA: Insufficient documentation

## 2011-08-05 NOTE — Assessment & Plan Note (Signed)
Pt with LLE weakness, numbness and no improvement in pain despite prednisone taper and prn Percocet.  Will refer for MRI of the Lumbar spine for further evaluation.

## 2011-08-08 ENCOUNTER — Telehealth: Payer: Self-pay | Admitting: *Deleted

## 2011-08-08 NOTE — Telephone Encounter (Signed)
Received call from pt requesting correction to work note that was previously provided. Current note has her out through 08/05/11 and returning to work on 08/05/11. Letter should state to return on 08/08/11. Note corrected and faxed to [phone].

## 2011-08-18 ENCOUNTER — Encounter: Payer: Self-pay | Admitting: Family

## 2011-08-18 ENCOUNTER — Telehealth: Payer: Self-pay | Admitting: Family

## 2011-08-18 NOTE — Telephone Encounter (Signed)
Patient insurance changed     Or has no insurance  coverage         Patient has not return call so we can get this scheduled

## 2011-08-25 ENCOUNTER — Telehealth: Payer: Self-pay | Admitting: Family

## 2011-08-25 NOTE — Telephone Encounter (Signed)
This pt has referral request for MRI   ,unable to reach patient by phone and email

## 2011-08-26 ENCOUNTER — Encounter: Payer: Self-pay | Admitting: Family

## 2011-09-02 ENCOUNTER — Ambulatory Visit: Payer: Self-pay | Admitting: Family

## 2011-10-12 ENCOUNTER — Emergency Department (HOSPITAL_BASED_OUTPATIENT_CLINIC_OR_DEPARTMENT_OTHER)
Admission: EM | Admit: 2011-10-12 | Discharge: 2011-10-12 | Disposition: A | Discharge status: Home / Self Care | Payer: Self-pay | Attending: Emergency Medicine | Admitting: Emergency Medicine

## 2011-10-12 ENCOUNTER — Emergency Department (INDEPENDENT_AMBULATORY_CARE_PROVIDER_SITE_OTHER): Payer: Self-pay

## 2011-10-12 ENCOUNTER — Encounter (HOSPITAL_BASED_OUTPATIENT_CLINIC_OR_DEPARTMENT_OTHER): Payer: Self-pay | Admitting: Emergency Medicine

## 2011-10-12 DIAGNOSIS — R05 Cough: Secondary | ICD-10-CM

## 2011-10-12 DIAGNOSIS — R0989 Other specified symptoms and signs involving the circulatory and respiratory systems: Secondary | ICD-10-CM

## 2011-10-12 DIAGNOSIS — R51 Headache: Secondary | ICD-10-CM

## 2011-10-12 DIAGNOSIS — R509 Fever, unspecified: Secondary | ICD-10-CM | POA: Insufficient documentation

## 2011-10-12 DIAGNOSIS — R059 Cough, unspecified: Secondary | ICD-10-CM | POA: Insufficient documentation

## 2011-10-12 DIAGNOSIS — R918 Other nonspecific abnormal finding of lung field: Secondary | ICD-10-CM

## 2011-10-12 IMAGING — CR DG CHEST 2V
2 series · 2 of 2 positions shown · non-contrast
Comparison: [DATE].

CLINICAL DATA: Cough, congestion and headache.

CHEST - 2 VIEW

[w chest pa]
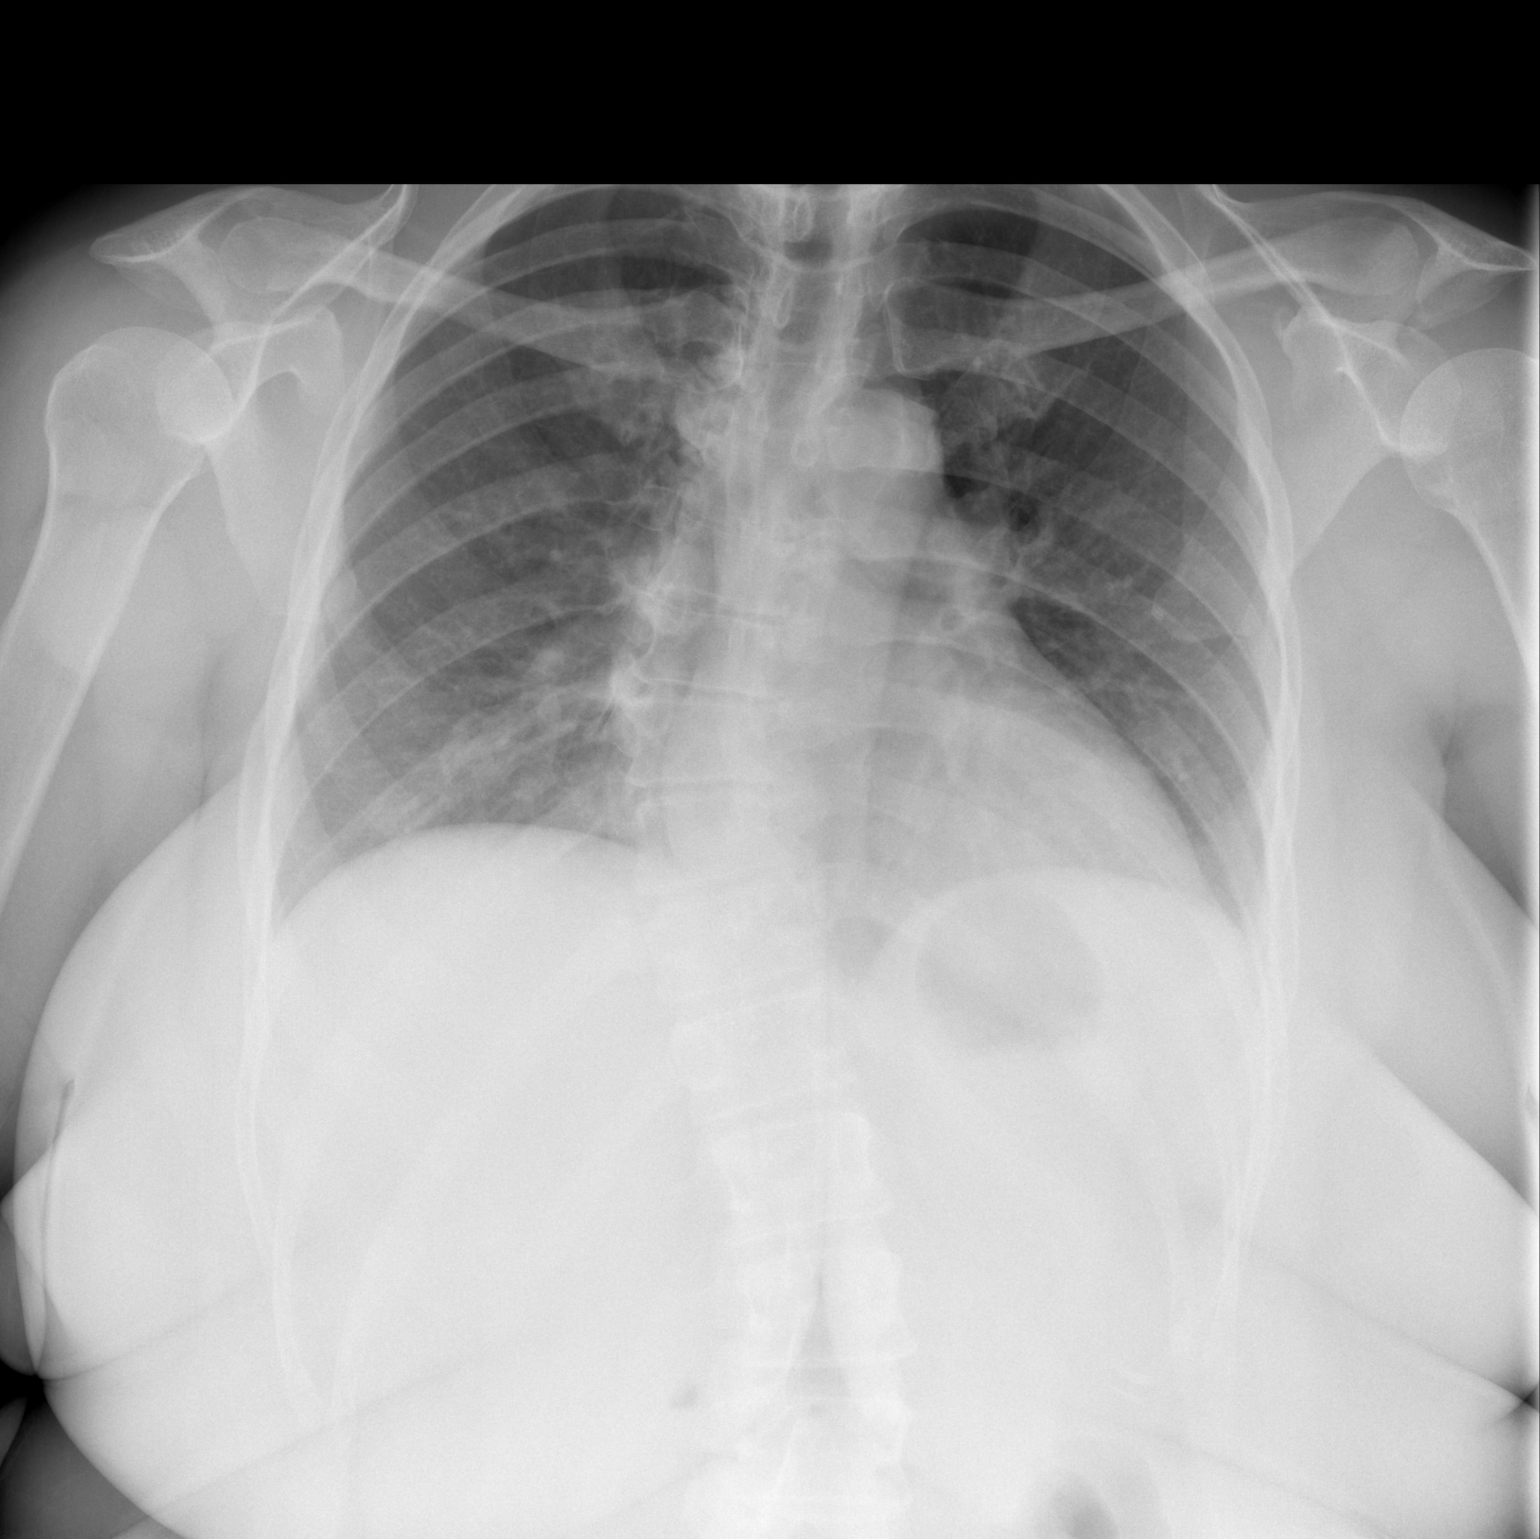

[w chest lat]
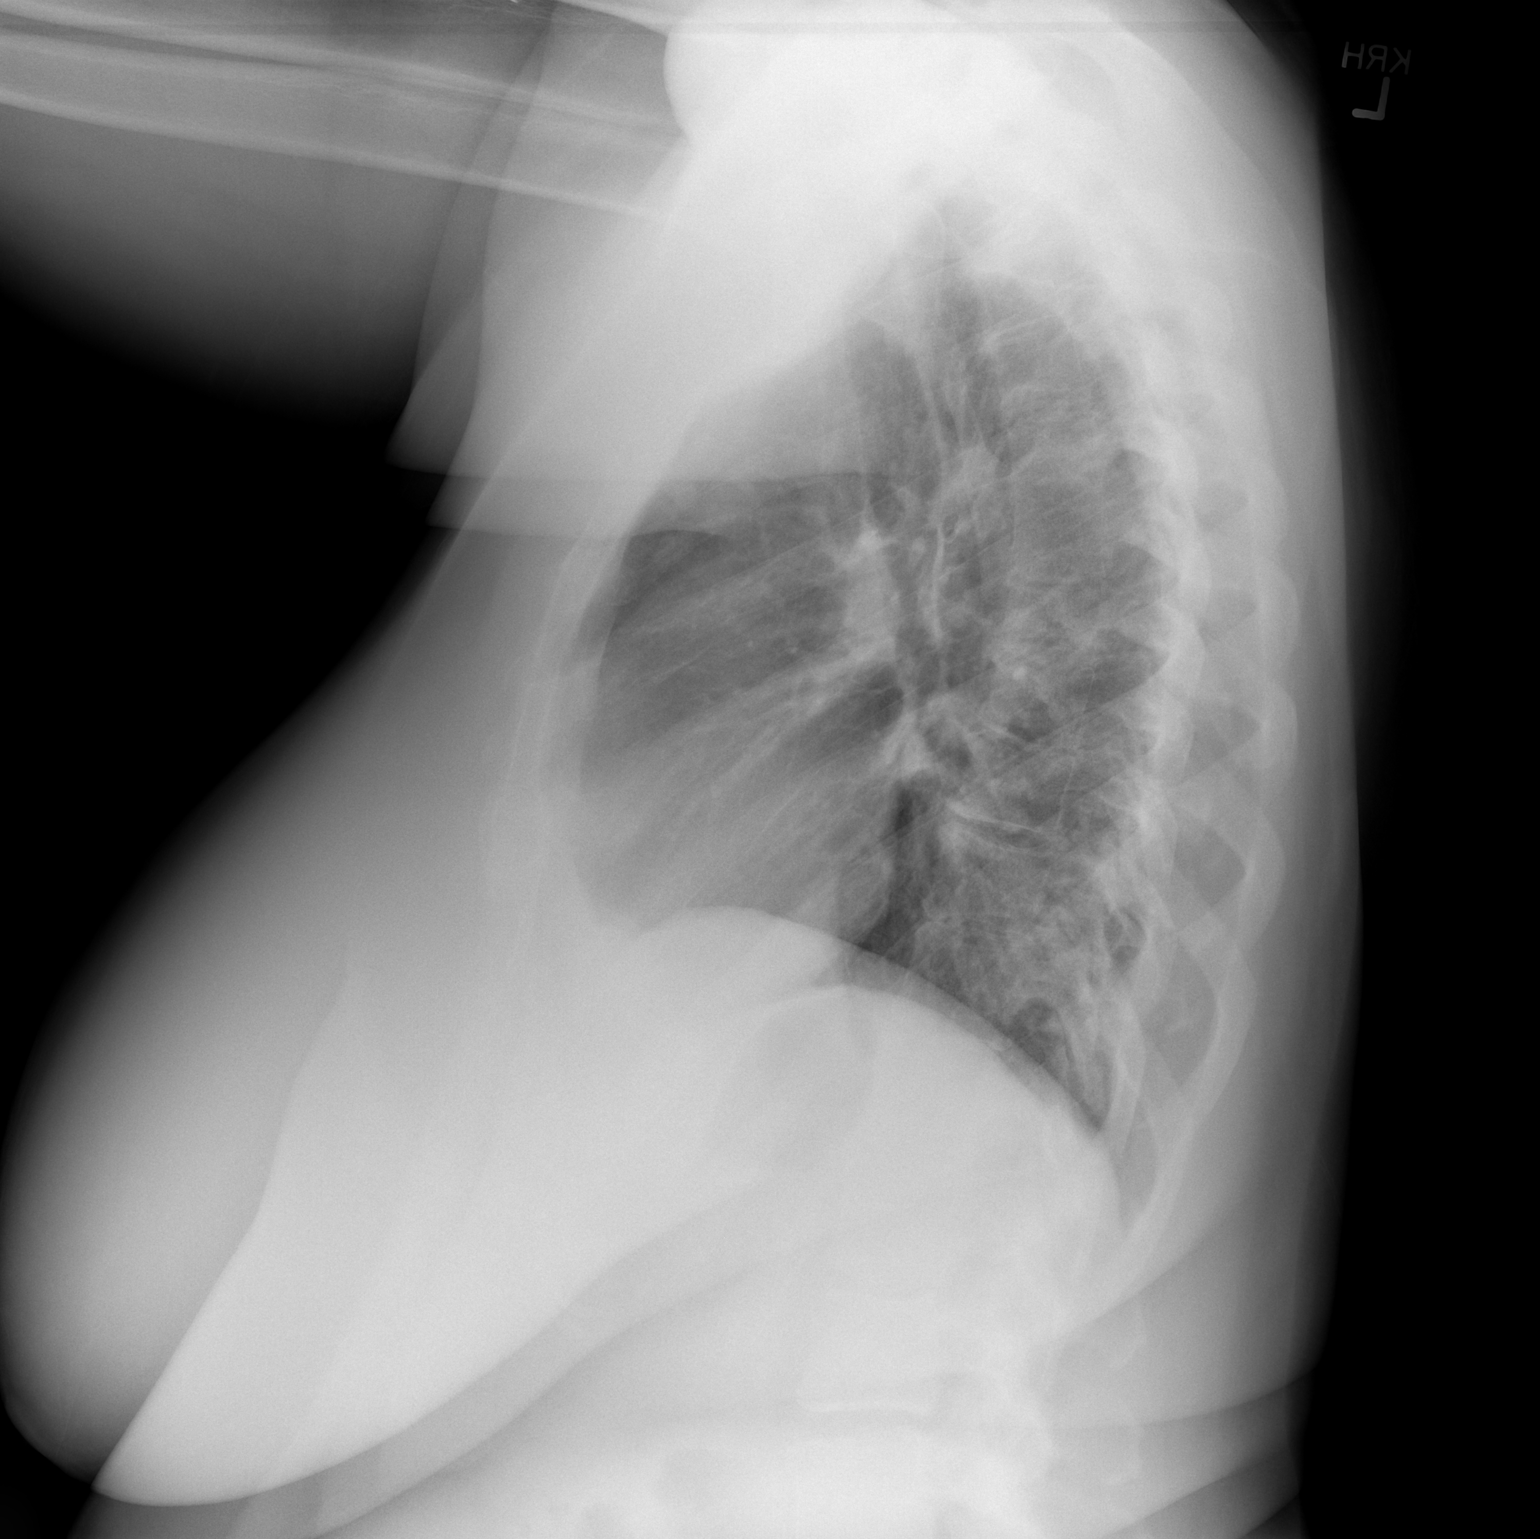

[2 of 2 positions shown; findings below may reference images not displayed]

FINDINGS: Trachea is midline.  Heart size normal.  Lungs are low in
volume with bibasilar air space opacification, similar to
[DATE].  No pleural fluid. S-shaped scoliosis.
IMPRESSION: Bibasilar air space opacification is likely due to atelectasis and
low lung volumes.  Difficult to exclude pneumonia.

## 2011-10-12 MED ORDER — IBUPROFEN 800 MG PO TABS
800.0000 mg | ORAL_TABLET | Freq: Once | ORAL | Status: AC
  Administered 2011-10-12: 800 mg via ORAL
  Filled 2011-10-12: qty 1

## 2011-10-12 MED ORDER — AZITHROMYCIN 250 MG PO TABS: 250.0000 mg | ORAL_TABLET | Freq: Every day | ORAL | Status: AC

## 2011-10-12 NOTE — Discharge Instructions (Signed)
Cough, Adult  A cough is a reflex. It helps you clear your throat and airways. A cough can help heal your body. A cough can last 2 or 3 weeks (acute) or may last more than 8 weeks (chronic). Some common causes of a cough can include an infection, allergy, or a cold. HOME CARE  Only take medicine as told by your doctor.   If given, take your medicines (antibiotics) as told. Finish them even if you start to feel better.   Use a cold steam vaporizer or humidier in your home. This can help loosen thick spit (secretions).   Sleep so you are almost sitting up (semi-upright). Use pillows to do this. This helps reduce coughing.   Rest as needed.   Stop smoking if you smoke.  GET HELP RIGHT AWAY IF:  You have yellowish-white fluid (pus) in your thick spit.   Your cough gets worse.   Your medicine does not reduce coughing, and you are losing sleep.   You cough up blood.   You have trouble breathing.   Your pain gets worse and medicine does not help.   You have a fever.  MAKE SURE YOU:   Understand these instructions.   Will watch your condition.   Will get help right away if you are not doing well or get worse.  Document Released: 03/24/2011 Document Revised: 06/30/2011 Document Reviewed: 03/24/2011 [employer] Patient Information 2012 [employer]. 

## 2011-10-12 NOTE — ED Notes (Signed)
Patient reports not feeling well for several days (starting Saturday).  Headache and Nausea starting today.  Non productive cough.

## 2011-10-12 NOTE — ED Notes (Signed)
Pt c/o of cough, chills, sore throat, fever, and achy body pain that started Saturday.  Some nausea, pt denies vomiting and diarrhea.

## 2011-10-12 NOTE — ED Provider Notes (Signed)
History     CSN: [id number]  Arrival date & time 10/12/11  1300   First MD Initiated Contact with Patient 10/12/11 1412      Chief Complaint  Patient presents with  . URI    (Consider location/radiation/quality/duration/timing/severity/associated sxs/prior treatment) Patient is a 41 y.o. female presenting with URI. The history is provided by the patient. No language interpreter was used.  URI The primary symptoms include fever, headaches, sore throat and cough. Primary symptoms do not include abdominal pain, nausea or vomiting. The current episode started 6 to 7 days ago. This is a new problem. The problem has not changed since onset. Symptoms associated with the illness include congestion.    Past Medical History  Diagnosis Date  . Elevated blood pressure reading without diagnosis of hypertension     Past Surgical History  Procedure Date  . Knee surgery 1992    left knee  . Knee arthroscopy 1992    right knee    Family History  Problem Relation Age of Onset  . Arthritis Mother   . Stroke Father   . Hypertension Father   . Diabetes Father   . Heart disease Father   . Hyperlipidemia Father   . Stroke Maternal Grandmother   . Diabetes Maternal Grandmother   . Heart disease Maternal Grandfather   . Diabetes Paternal Grandmother   . Diabetes Paternal Grandfather   . Heart disease Paternal Grandfather     History  Substance Use Topics  . Smoking status: Never Smoker   . Smokeless tobacco: Not on file  . Alcohol Use: Yes    OB History    Grav Para Term Preterm Abortions TAB SAB Ect Mult Living                  Review of Systems  Constitutional: Positive for fever.  HENT: Positive for congestion and sore throat.   Respiratory: Positive for cough.   Cardiovascular: Negative.   Gastrointestinal: Negative for nausea, vomiting and abdominal pain.  Neurological: Positive for headaches.    Allergies  Dimetapp  Home Medications   Current Outpatient Rx    Name Route Sig Dispense Refill  . ACETAMINOPHEN 500 MG PO TABS Oral Take 1,500 mg by mouth every 6 (six) hours as needed.    . GUAIFENESIN ER 600 MG PO TB12 Oral Take 1,200 mg by mouth 2 (two) times daily.    Marland Kitchen ZICAM COLD REMEDY PO LIQD Oral Take by mouth.      BP 167/98  Pulse 108  Temp(Src) 98 F (36.7 C) (Oral)  Resp 20  Ht 5\' 7"  (1.702 m)  Wt 260 lb (117.935 kg)  BMI 40.72 kg/m2  SpO2 99%  Physical Exam  Nursing note and vitals reviewed. Constitutional: She appears well-developed and well-nourished.  HENT:  Head: Normocephalic.  Right Ear: External ear normal.  Left Ear: External ear normal.  Nose: Rhinorrhea present.  Mouth/Throat: Posterior oropharyngeal erythema present.  Eyes: Conjunctivae and EOM are normal.  Neck: Normal range of motion.  Cardiovascular: Normal rate and regular rhythm.   Pulmonary/Chest: Effort normal and breath sounds normal.  Musculoskeletal: Normal range of motion.  Neurological: She is alert.  Skin: Skin is warm and dry.  Psychiatric: She has a normal mood and affect.    ED Course  Procedures (including critical care time)  Labs Reviewed - No data to display Dg Chest 2 View  10/12/2011  *RADIOLOGY REPORT*  Clinical Data: Cough, congestion and headache.  CHEST - 2 VIEW  Comparison: 09/18/2007.  Findings: Trachea is midline.  Heart size normal.  Lungs are low in volume with bibasilar air space opacification, similar to 09/18/2007.  No pleural fluid. S-shaped scoliosis.  IMPRESSION: Bibasilar air space opacification is likely due to atelectasis and low lung volumes.  Difficult to exclude pneumonia.  Original Report Authenticated By: Reyes Ivan, M.D.     1. Cough       MDM  Will treat with antibiotics as can't exclude pneumonia       Teressa Lower, NP 10/12/11 1528

## 2011-10-13 NOTE — ED Provider Notes (Signed)
Medical screening examination/treatment/procedure(s) were performed by non-physician practitioner and as supervising physician I was immediately available for consultation/collaboration.    Kelley Knoth L Edman Lipsey, MD 10/13/11 0757 

## 2012-06-30 ENCOUNTER — Emergency Department (HOSPITAL_BASED_OUTPATIENT_CLINIC_OR_DEPARTMENT_OTHER)
Admission: EM | Admit: 2012-06-30 | Discharge: 2012-06-30 | Disposition: A | Discharge status: Home / Self Care | Payer: Self-pay | Attending: Emergency Medicine | Admitting: Emergency Medicine

## 2012-06-30 ENCOUNTER — Encounter (HOSPITAL_BASED_OUTPATIENT_CLINIC_OR_DEPARTMENT_OTHER): Payer: Self-pay | Admitting: *Deleted

## 2012-06-30 DIAGNOSIS — L299 Pruritus, unspecified: Secondary | ICD-10-CM | POA: Insufficient documentation

## 2012-06-30 DIAGNOSIS — R030 Elevated blood-pressure reading, without diagnosis of hypertension: Secondary | ICD-10-CM | POA: Insufficient documentation

## 2012-06-30 DIAGNOSIS — L089 Local infection of the skin and subcutaneous tissue, unspecified: Secondary | ICD-10-CM | POA: Insufficient documentation

## 2012-06-30 MED ORDER — CEPHALEXIN 500 MG PO CAPS: 500.0000 mg | ORAL_CAPSULE | Freq: Four times a day (QID) | ORAL | Status: DC

## 2012-06-30 NOTE — ED Notes (Signed)
Rash to perineum and "one spot on right side face" since Thanksgiving. +itching "mostly at night"

## 2012-06-30 NOTE — ED Provider Notes (Signed)
History     CSN: [id number]  Arrival date & time 06/30/12  1827   First MD Initiated Contact with Patient 06/30/12 1856      Chief Complaint  Patient presents with  . Rash    (Consider location/radiation/quality/duration/timing/severity/associated sxs/prior treatment) Patient is a 41 y.o. female presenting with rash. The history is provided by the patient. No language interpreter was used.  Rash  This is a new problem. The problem has been gradually worsening. The problem is associated with nothing. There has been no fever. The rash is present on the face and right upper leg. Associated symptoms include itching. She has tried nothing for the symptoms.  Pt compains of a rash to the side of her face.  Pt she also has rash in right groin area that is itching.   Pt is here with son who has rash that looks like impetigo  Past Medical History  Diagnosis Date  . Elevated blood pressure reading without diagnosis of hypertension     Past Surgical History  Procedure Date  . Knee surgery 1992    left knee  . Knee arthroscopy 1992    right knee    Family History  Problem Relation Age of Onset  . Arthritis Mother   . Stroke Father   . Hypertension Father   . Diabetes Father   . Heart disease Father   . Hyperlipidemia Father   . Stroke Maternal Grandmother   . Diabetes Maternal Grandmother   . Heart disease Maternal Grandfather   . Diabetes Paternal Grandmother   . Diabetes Paternal Grandfather   . Heart disease Paternal Grandfather     History  Substance Use Topics  . Smoking status: Never Smoker   . Smokeless tobacco: Not on file  . Alcohol Use: Yes    OB History    Grav Para Term Preterm Abortions TAB SAB Ect Mult Living                  Review of Systems  Skin: Positive for itching and rash.    Allergies  Dimetapp  Home Medications   Current Outpatient Rx  Name  Route  Sig  Dispense  Refill  . ACETAMINOPHEN 500 MG PO TABS   Oral   Take 1,500 mg by  mouth every 6 (six) hours as needed.         . GUAIFENESIN ER 600 MG PO TB12   Oral   Take 1,200 mg by mouth 2 (two) times daily.         Marland Kitchen ZICAM COLD REMEDY PO LIQD   Oral   Take by mouth.           BP 152/93  Pulse 86  Temp 98.2 F (36.8 C) (Oral)  Resp 20  Ht 5\' 7"  (1.702 m)  Wt 280 lb (127.007 kg)  BMI 43.85 kg/m2  SpO2 100%  Physical Exam  Nursing note and vitals reviewed. Constitutional: She is oriented to person, place, and time. She appears well-developed and well-nourished.  HENT:  Head: Normocephalic.  Eyes: Pupils are equal, round, and reactive to light.  Neck: Normal range of motion.  Cardiovascular: Normal rate.   Pulmonary/Chest: Effort normal.  Musculoskeletal:       Scattered pustules face,   Erythema and darkened skin right groin area  Neurological: She is alert and oriented to person, place, and time. She has normal reflexes.  Skin: Rash noted.  Psychiatric: She has a normal mood and affect.  ED Course  Procedures (including critical care time)  Labs Reviewed - No data to display No results found.   1. Skin infection       MDM  Pt advised lotrimin to rash area around groin.  Pt given rx for keflex.        Lonia Skinner [place], [place] 07/01/12 [phone]

## 2012-07-01 NOTE — ED Provider Notes (Signed)
Medical screening examination/treatment/procedure(s) were performed by non-physician practitioner and as supervising physician I was immediately available for consultation/collaboration.  Rahkeem Senft, MD 07/01/12 0110 

## 2014-08-27 ENCOUNTER — Ambulatory Visit: Payer: Self-pay | Admitting: Family

## 2014-09-01 ENCOUNTER — Ambulatory Visit: Payer: BC Managed Care – PPO | Admitting: Family

## 2014-09-22 ENCOUNTER — Ambulatory Visit: Payer: BC Managed Care – PPO | Admitting: Family

## 2014-09-25 ENCOUNTER — Encounter: Payer: Self-pay | Admitting: Medical

## 2014-09-25 ENCOUNTER — Telehealth: Payer: Self-pay | Admitting: Family

## 2014-09-25 NOTE — Telephone Encounter (Signed)
Pt was no show for appt 09/22/14- has not rescheduled. Letter sent- do you want to charge a no show fee?

## 2014-09-25 NOTE — Telephone Encounter (Signed)
Can charge.

## 2014-09-25 NOTE — Telephone Encounter (Signed)
Charge request sent

## 2014-10-23 ENCOUNTER — Encounter (HOSPITAL_COMMUNITY): Payer: Self-pay | Admitting: Emergency Medicine

## 2014-10-23 ENCOUNTER — Emergency Department (INDEPENDENT_AMBULATORY_CARE_PROVIDER_SITE_OTHER): Payer: Medicaid Other

## 2014-10-23 ENCOUNTER — Emergency Department (HOSPITAL_COMMUNITY)
Admission: EM | Admit: 2014-10-23 | Discharge: 2014-10-23 | Disposition: A | Discharge status: Home / Self Care | Payer: Medicaid Other | Source: Home / Self Care | Attending: Family Medicine | Admitting: Family Medicine

## 2014-10-23 DIAGNOSIS — M791 Myalgia: Secondary | ICD-10-CM | POA: Diagnosis not present

## 2014-10-23 DIAGNOSIS — M7042 Prepatellar bursitis, left knee: Secondary | ICD-10-CM | POA: Diagnosis not present

## 2014-10-23 DIAGNOSIS — M7918 Myalgia, other site: Secondary | ICD-10-CM

## 2014-10-23 IMAGING — DX DG HIP (WITH OR WITHOUT PELVIS) 2-3V*L*
3 series · 3 of 3 positions shown · non-contrast
Comparison: No priors.

CLINICAL DATA: 43-year-old female with history of trauma from a
fall 4 days ago onto the left side complaining of pain in the
posterior aspect of the left hip.

EXAM:
LEFT HIP (WITH PELVIS) 2-3 VIEWS

[pelvis ap]
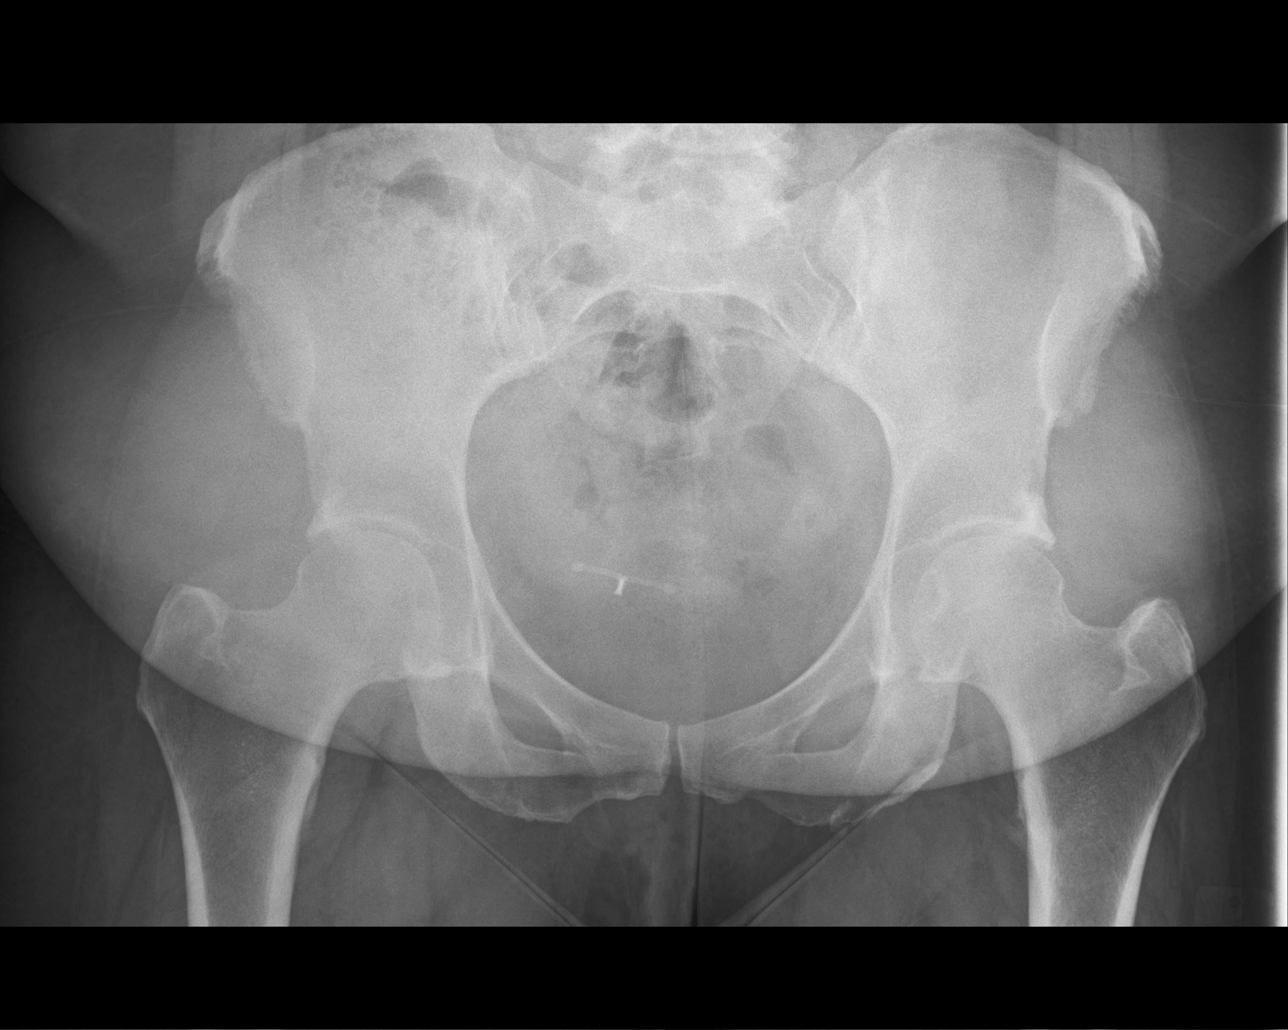

[hip ap]
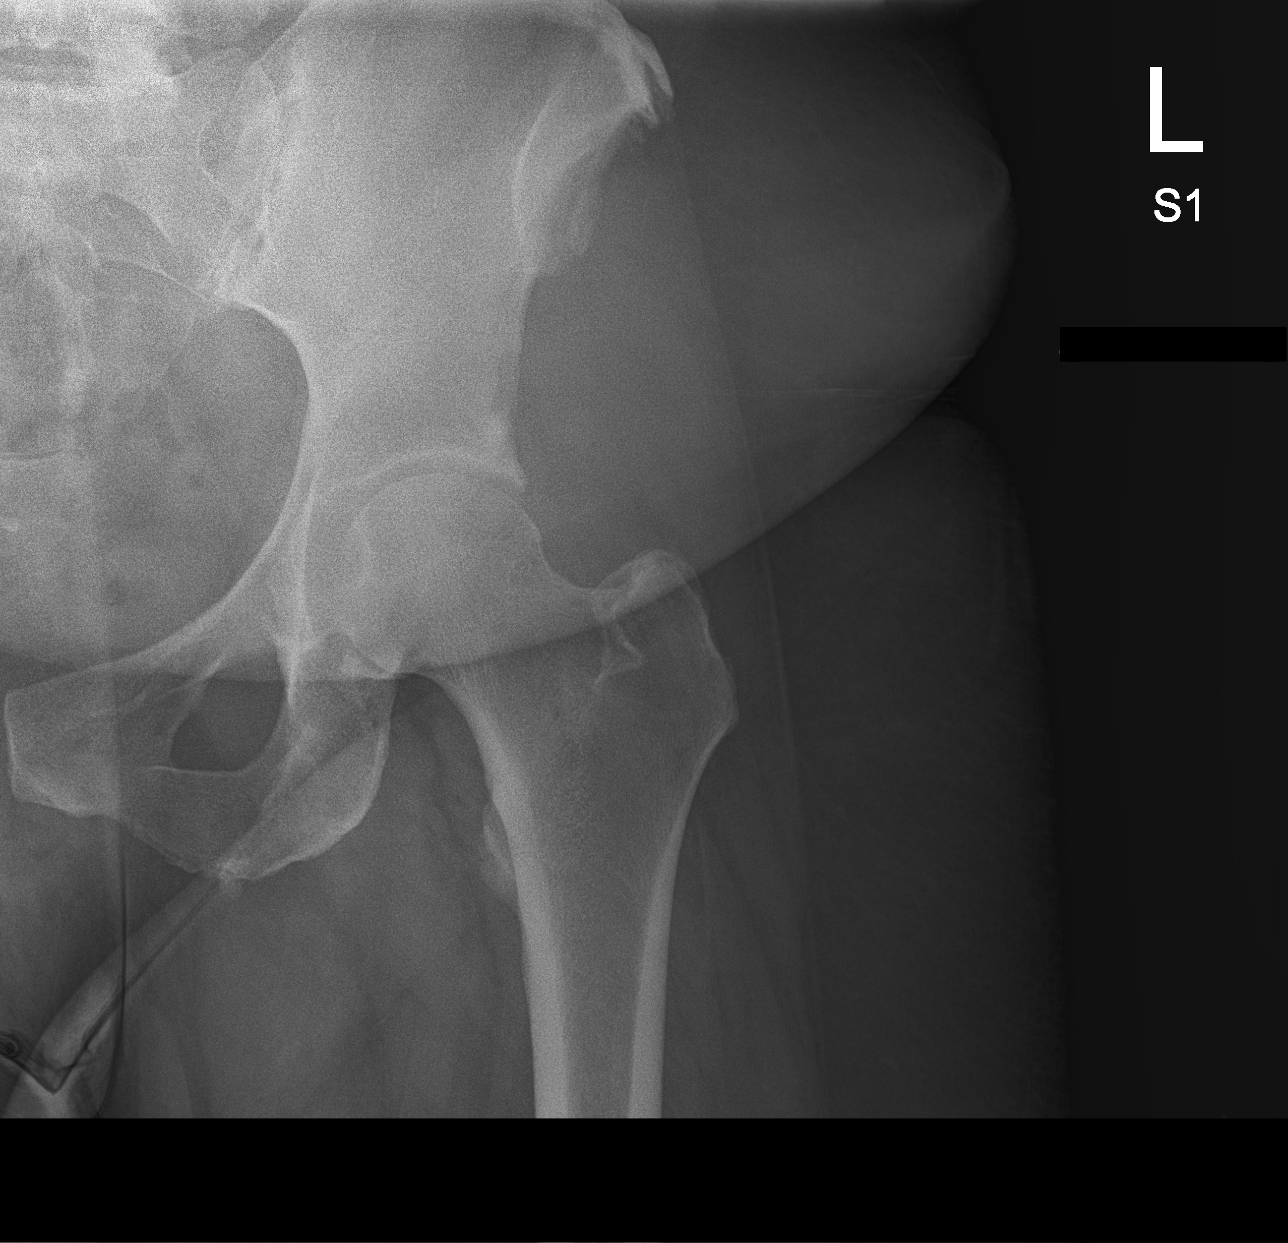

[hip lat]
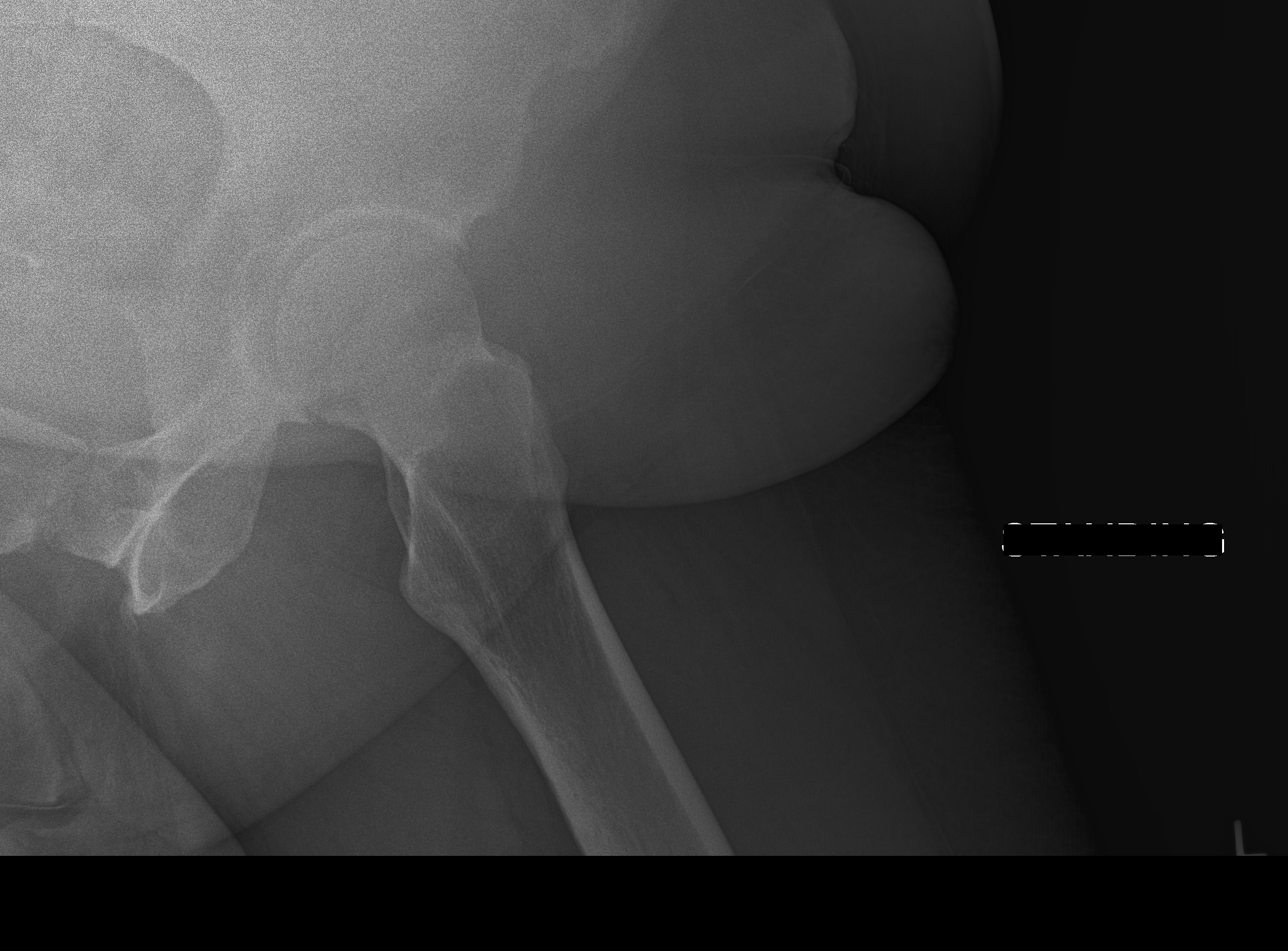

[3 of 3 positions shown; findings below may reference images not displayed]

FINDINGS: Bony pelvic ring is intact. Bilateral proximal femurs as visualized
appear intact, and the left femoral head is properly located. Mild
joint space narrowing, subchondral sclerosis and osteophyte
formation is noted in the hip joints bilaterally, compatible with
mild bilateral hip joint osteoarthritis. An IUD is seen projecting
over the central pelvis.
IMPRESSION: 1. No acute radiographic abnormality of the bony pelvis or left hip.

## 2014-10-23 IMAGING — DX DG KNEE COMPLETE 4+V*L*
4 series · 4 of 4 positions shown · non-contrast
Comparison: None.

CLINICAL DATA: Fall on [REDACTED] landing on left knee, now with left
knee pain. History of left knee surgery multiple years prior.

EXAM:
LEFT KNEE - COMPLETE 4+ VIEW

[knee ap (1 of 4)]
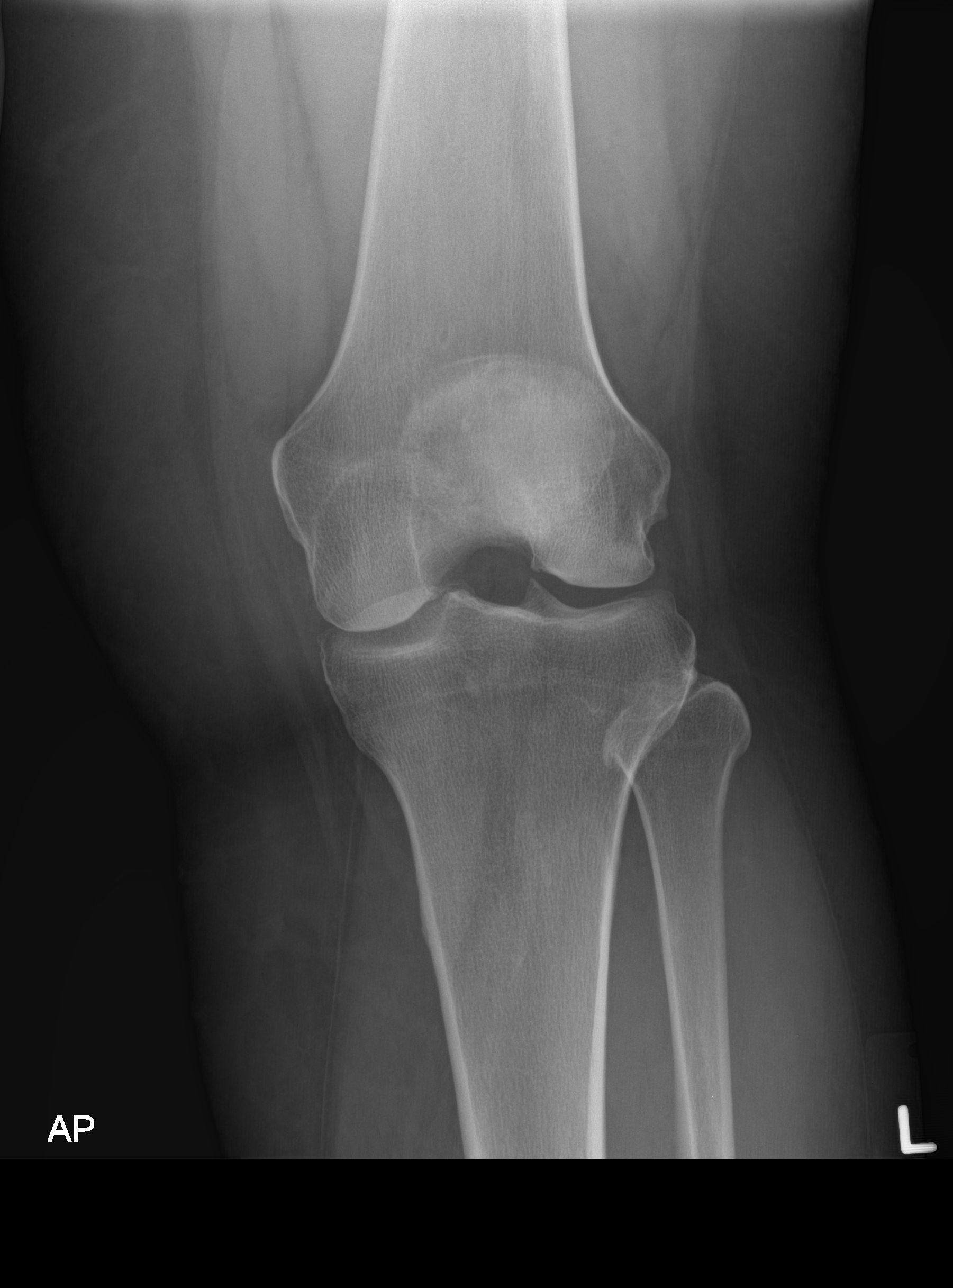

[knee ap (2 of 4)]
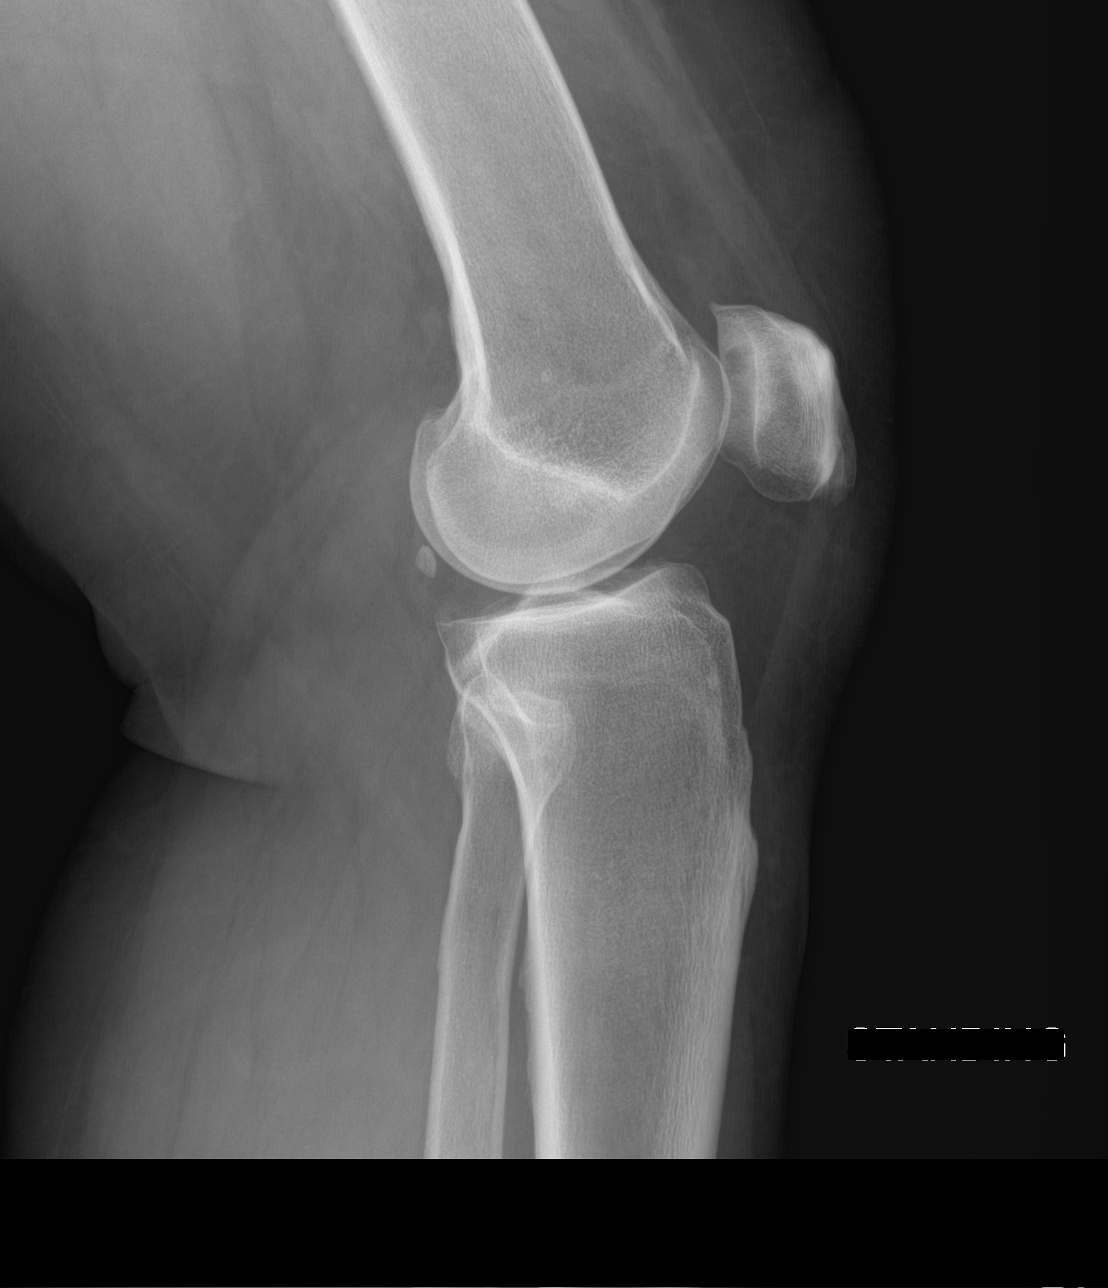

[knee ap (3 of 4)]
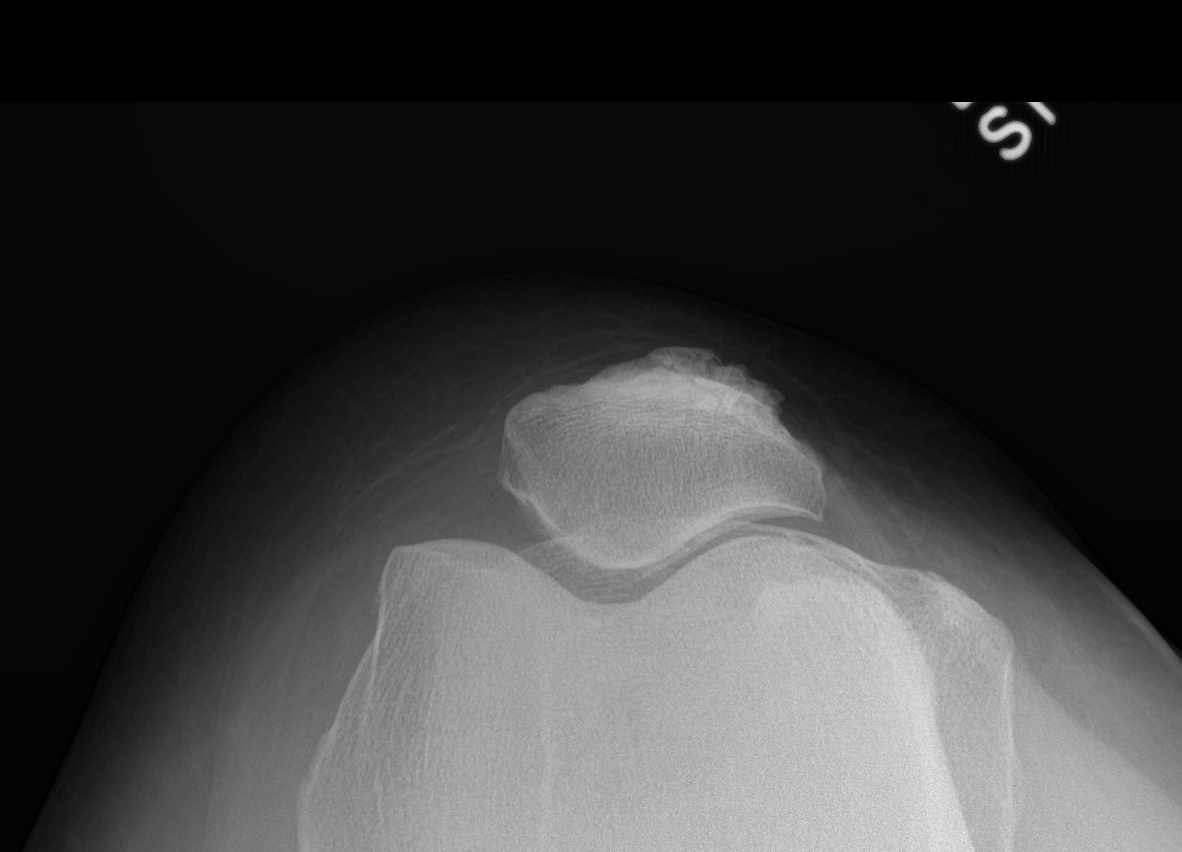

[knee ap (4 of 4)]
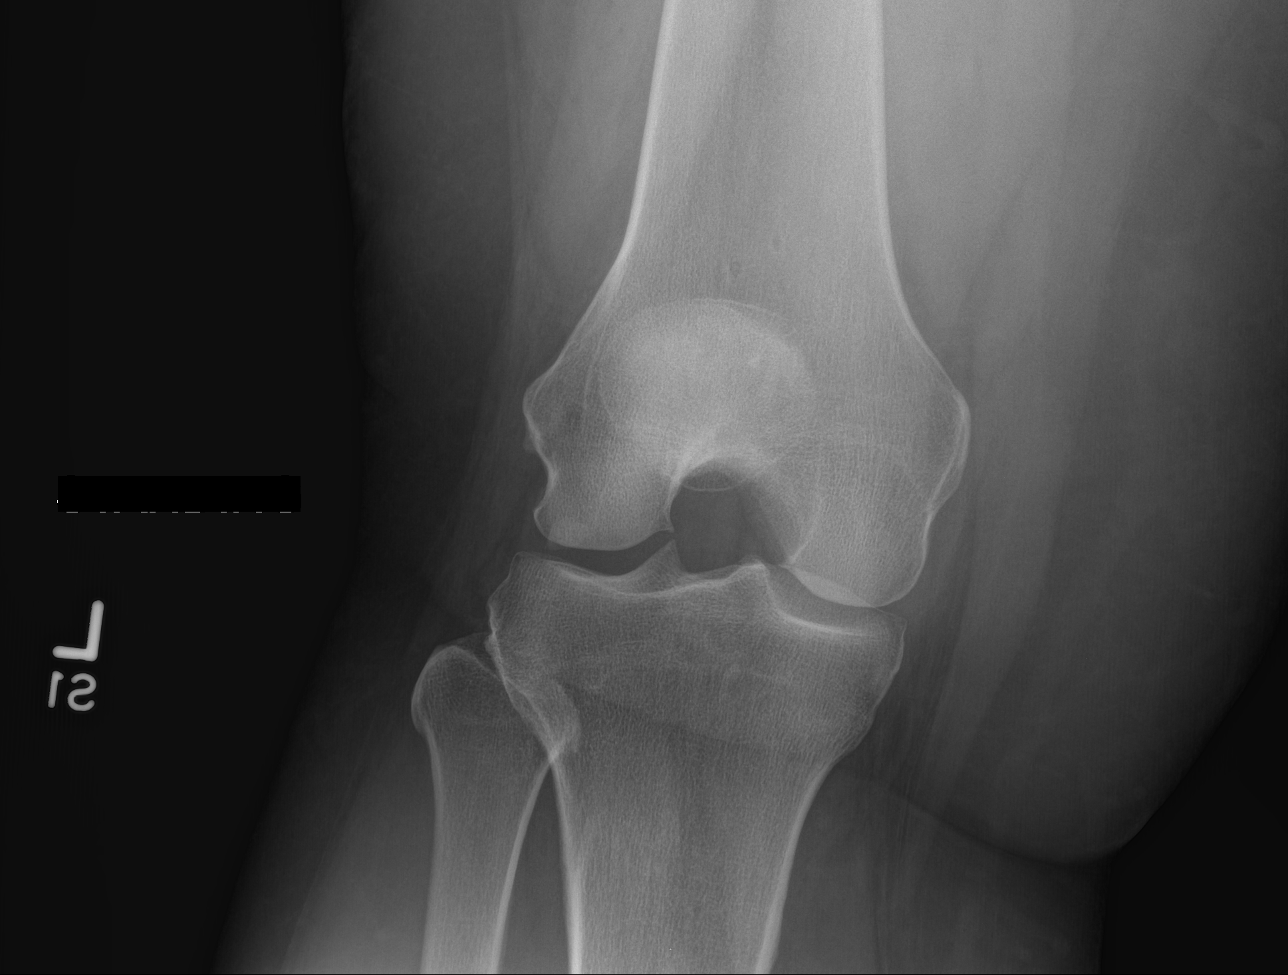

[4 of 4 positions shown; findings below may reference images not displayed]

FINDINGS: No fracture or dislocation. The alignment is maintained. There is
minimal joint space narrowing of the lateral patellofemoral
compartment, and joint spaces are otherwise preserved. Tiny patellar
spurs. A fabella is noted. No joint effusion. Small patellar tendon
enthesophyte.
IMPRESSION: No fracture or dislocation. Mild patellofemoral osteoarthritis
primarily laterally.

## 2014-10-23 MED ORDER — TRAMADOL HCL 50 MG PO TABS: 50.0000 mg | ORAL_TABLET | Freq: Four times a day (QID) | ORAL | Status: DC | PRN

## 2014-10-23 NOTE — ED Provider Notes (Signed)
Stacy Campos is a 44 y.o. female who presents to Urgent Care today for Left hip and left knee pain. Symptoms present for the last few days. Patient tripped and fell while walking up her stairs 4 days ago. She notes pain in the anterior aspect of her left knee and onto the left ear gluteus. Pain is moderate to severe. She has tried ibuprofen which has not helped. No fevers or chills nausea vomiting or diarrhea. No radiating pain weakness or numbness.   Past Medical History  Diagnosis Date  . Elevated blood pressure reading without diagnosis of hypertension    Past Surgical History  Procedure Laterality Date  . Knee surgery  1992    left knee  . Knee arthroscopy  1992    right knee   History  Substance Use Topics  . Smoking status: Never Smoker   . Smokeless tobacco: Not on file  . Alcohol Use: Yes   ROS as above Medications: No current facility-administered medications for this encounter.   Current Outpatient Prescriptions  Medication Sig Dispense Refill  . acetaminophen (TYLENOL) 500 MG tablet Take 1,500 mg by mouth every 6 (six) hours as needed.    . cephALEXin (KEFLEX) 500 MG capsule Take 1 capsule (500 mg total) by mouth 4 (four) times daily. 40 capsule 0  . guaiFENesin (MUCINEX) 600 MG 12 hr tablet Take 1,200 mg by mouth 2 (two) times daily.    . Homeopathic Products (ZICAM COLD REMEDY) LIQD Take by mouth.    . traMADol (ULTRAM) 50 MG tablet Take 1 tablet (50 mg total) by mouth every 6 (six) hours as needed. 15 tablet 0   Allergies  Allergen Reactions  . Dimetapp [[employer] Di Bromm] Hives     Exam:  BP 162/96 mmHg  Pulse 105  Temp(Src) 97.5 F (36.4 C) (Oral)  Resp 20  SpO2 98% Gen: Well NAD HEENT: EOMI,  MMM Lungs: Normal work of breathing. CTABL Heart: RRR no MRG Abd: NABS, Soft. Nondistended, Nontender Exts: Brisk capillary refill, warm and well perfused.  Left knee. No significant effusion. Tender palpation anterior knee. Nontender otherwise. Range of  motion 0-100 with 1+ retropatellar crepitations. Stable ligamentous exam.  Left hip nontender greater trochanter. Normal hip range of motion. Tender palpation left gluteus.  No results found for this or any previous visit (from the past 24 hour(s)). Dg Knee Complete 4 Views Left  10/23/2014   CLINICAL DATA:  Fall on Sunday landing on left knee, now with left knee pain. History of left knee surgery multiple years prior.  EXAM: LEFT KNEE - COMPLETE 4+ VIEW  COMPARISON:  None.  FINDINGS: No fracture or dislocation. The alignment is maintained. There is minimal joint space narrowing of the lateral patellofemoral compartment, and joint spaces are otherwise preserved. Tiny patellar spurs. A fabella is noted. No joint effusion. Small patellar tendon enthesophyte.  IMPRESSION: No fracture or dislocation. Mild patellofemoral osteoarthritis primarily laterally.   Electronically Signed   By: Jeb Levering M.D.   On: 10/23/2014 17:28   Dg Hip Unilat With Pelvis 2-3 Views Left  10/23/2014   CLINICAL DATA:  44 year old female with history of trauma from a fall 4 days ago onto the left side complaining of pain in the posterior aspect of the left hip.  EXAM: LEFT HIP (WITH PELVIS) 2-3 VIEWS  COMPARISON:  No priors.  FINDINGS: Bony pelvic ring is intact. Bilateral proximal femurs as visualized appear intact, and the left femoral head is properly located. 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 An IUD is seen projecting over the central pelvis.  IMPRESSION: 1. No acute radiographic abnormality of the bony pelvis or left hip.   Electronically Signed   By: Vinnie Langton M.D.   On: 10/23/2014 17:14    Assessment and Plan: 44 y.o. female with contusion to the left knee with probable left knee prepatellar bursitis. Additionally she has pain in her left gluteus that is possibly contusion  versus strain. She is already taking copious amounts of NSAIDs. We'll augment pain control with tramadol and encourage patient to follow-up with primary care provider versus orthopedics.  Discussed warning signs or symptoms. Please see discharge instructions. Patient expresses understanding.     Gregor Hams, MD 10/23/14 [phone]

## 2014-10-23 NOTE — Discharge Instructions (Signed)
Thank you for coming in today. Come back or go to the emergency room if you notice new weakness new numbness problems walking or bowel or bladder problems.   Bursitis Bursitis is inflammation of a bursa. A bursa is a soft, fluid-filled sac. It cushions the soft tissue around a bone. Bursitis often occurs in the bursas near the shoulders, elbows, knees, pelvis, hips, heel, and Achilles tendon.  SYMPTOMS   Pain and tenderness in the affected area. Sometimes, pain radiates into surrounding areas. Specifically, pain with movement.  Limited range of motion of the affected joint.  Sometimes, painless swelling of the bursa.  Fever (when infected). CAUSES   Injury to a joint or bursa.  Overuse or strenuous exercise of a joint.  Gout (disease with inflamed joints).  Prolonged pressure on a joint containing bursas (resting on an elbow or kneeling).  Arthritis.  Acute or chronic infection.  Calcium deposits in shoulder tendons, with degeneration of the tendon. RISK INCREASES WITH:  Vigorous, repeated, or sudden increase in athletic training or activity level.  Failure to warm up properly.  Overstretching.  Improper exercise technique.  Playing sports on [place]. PREVENTION  Avoid injuries or overuse of muscles.  Warm up and cool down properly. Do this before and after physical activity.  Maintain proper conditioning:  Joint flexibility.  Muscle strength and endurance.  Cardiovascular fitness.  Learn and use proper technique.  Wear protective equipment. PROGNOSIS  With proper treatment, symptoms often go away within 7 to 14 days.  RELATED COMPLICATIONS   Frequent recurrence of symptoms. This can result in a chronic, repetitive problem.  Joint stiffness.  Limited joint movement.  Infection of bursa.  Chronic inflammation or scarring of bursa. TREATMENT Treatment first involves protecting and resting the bursa and its joint. You may use ice or an elastic  bandage to reduce inflammation. Anti-inflammatory medicines may help resolve the swelling. If symptoms persist despite treatment, a caregiver may withdraw fluid from the bursa. They might also consider a corticosteroid injection. Sometimes, bursitis will persist in spite of nonsurgical treatment or will become infected. These cases may require removal (surgical excision) of the bursa.  MEDICATION   If pain medicine is needed, nonsteroidal anti-inflammatory medicines, such as aspirin and ibuprofen, or other minor pain relievers, such as acetaminophen, are often recommended.  Do not take pain medicine for 7 days before surgery.  Prescription pain relievers are usually only prescribed after surgery. Use only as directed and only as much as you need.  Ointments applied to the skin may be helpful.  Corticosteroid injections may be given. This is done to reduce inflammation in the bursa. HEAT AND COLD:  Cold treatment (icing) relieves pain and reduces inflammation. Cold treatment should be applied for 10 to 15 minutes every 2 to 3 hours for inflammation and pain, and immediately after any activity that aggravates your symptoms. Use ice packs or an ice massage.  Heat treatment may be used prior to performing the stretching and strengthening activities prescribed by your caregiver, physical therapist, or athletic trainer. Use a heat pack or a warm soak. SEEK MEDICAL CARE IF:   Symptoms get worse or do not improve in 2 weeks, despite treatment.  New, unexplained symptoms develop. (Drugs used in treatment may produce side effects.) Document Released: 07/11/2005 Document Revised: 11/25/2013 Document Reviewed: 10/23/2008 [facility] Patient Information 2015 [place], [place]. This information is not intended to replace advice given to you by your health care provider. Make sure you discuss any questions you have with  your health care provider.

## 2014-10-23 NOTE — ED Notes (Signed)
Reports falling up stairs on Sunday injuring left leg and left knee.  Hx of left leg and left knee pain from prior injury.    Pt taking three 800 mg of ibuprofen at once for pain relief.

## 2014-10-27 ENCOUNTER — Encounter (HOSPITAL_COMMUNITY): Payer: Self-pay | Admitting: Emergency Medicine

## 2014-10-27 ENCOUNTER — Emergency Department (HOSPITAL_COMMUNITY)
Admission: EM | Admit: 2014-10-27 | Discharge: 2014-10-27 | Disposition: A | Discharge status: Home / Self Care | Payer: Medicaid Other | Attending: Emergency Medicine | Admitting: Emergency Medicine

## 2014-10-27 DIAGNOSIS — Z792 Long term (current) use of antibiotics: Secondary | ICD-10-CM | POA: Diagnosis not present

## 2014-10-27 DIAGNOSIS — J029 Acute pharyngitis, unspecified: Secondary | ICD-10-CM | POA: Diagnosis present

## 2014-10-27 DIAGNOSIS — J020 Streptococcal pharyngitis: Secondary | ICD-10-CM | POA: Diagnosis not present

## 2014-10-27 DIAGNOSIS — Z79899 Other long term (current) drug therapy: Secondary | ICD-10-CM | POA: Insufficient documentation

## 2014-10-27 LAB — RAPID STREP SCREEN (MED CTR MEBANE ONLY): Streptococcus, Group A Screen (Direct): POSITIVE — AB

## 2014-10-27 MED ORDER — HYDROCODONE-HOMATROPINE 5-1.5 MG/5ML PO SYRP: 5.0000 mL | ORAL_SOLUTION | Freq: Four times a day (QID) | ORAL | Status: DC | PRN

## 2014-10-27 MED ORDER — DEXAMETHASONE 1 MG/ML PO CONC: 10.0000 mg | Freq: Once | ORAL | Status: DC

## 2014-10-27 MED ORDER — HYDROCODONE-HOMATROPINE 5-1.5 MG/5ML PO SYRP
5.0000 mL | ORAL_SOLUTION | Freq: Once | ORAL | Status: AC
  Administered 2014-10-27: 5 mL via ORAL
  Filled 2014-10-27: qty 5

## 2014-10-27 MED ORDER — DEXAMETHASONE SODIUM PHOSPHATE 10 MG/ML IJ SOLN
10.0000 mg | Freq: Once | INTRAMUSCULAR | Status: AC
  Administered 2014-10-27: 10 mg
  Filled 2014-10-27: qty 1

## 2014-10-27 MED ORDER — PENICILLIN G BENZATHINE 1200000 UNIT/2ML IM SUSP
1.2000 10*6.[IU] | Freq: Once | INTRAMUSCULAR | Status: AC
  Administered 2014-10-27: 1.2 10*6.[IU] via INTRAMUSCULAR
  Filled 2014-10-27: qty 2

## 2014-10-27 NOTE — Discharge Instructions (Signed)
Please follow up with your primary care physician in 1-2 days. If you do not have one please call the [facility] number listed above. Please take pain medication and/or muscle relaxants as prescribed and as needed for pain. Please do not drive on narcotic pain medication or on muscle relaxants. Please read all discharge instructions and return precautions.    Pharyngitis Pharyngitis is redness, pain, and swelling (inflammation) of your pharynx.  CAUSES  Pharyngitis is usually caused by infection. Most of the time, these infections are from viruses (viral) and are part of a cold. However, sometimes pharyngitis is caused by bacteria (bacterial). Pharyngitis can also be caused by allergies. Viral pharyngitis may be spread from person to person by coughing, sneezing, and personal items or utensils (cups, forks, spoons, toothbrushes). Bacterial pharyngitis may be spread from person to person by more intimate contact, such as kissing.  SIGNS AND SYMPTOMS  Symptoms of pharyngitis include:   Sore throat.   Tiredness (fatigue).   Low-grade fever.   Headache.  Joint pain and muscle aches.  Skin rashes.  Swollen lymph nodes.  Plaque-like film on throat or tonsils (often seen with bacterial pharyngitis). DIAGNOSIS  Your health care provider will ask you questions about your illness and your symptoms. Your medical history, along with a physical exam, is often all that is needed to diagnose pharyngitis. Sometimes, a rapid strep test is done. Other lab tests may also be done, depending on the suspected cause.  TREATMENT  Viral pharyngitis will usually get better in 3-4 days without the use of medicine. Bacterial pharyngitis is treated with medicines that kill germs (antibiotics).  HOME CARE INSTRUCTIONS   Drink enough water and fluids to keep your urine clear or pale yellow.   Only take over-the-counter or prescription medicines as directed by your health care provider:    If you are prescribed antibiotics, make sure you finish them even if you start to feel better.   Do not take aspirin.   Get lots of rest.   Gargle with 8 oz of salt water ( tsp of salt per 1 qt of water) as often as every 1-2 hours to soothe your throat.   Throat lozenges (if you are not at risk for choking) or sprays may be used to soothe your throat. SEEK MEDICAL CARE IF:   You have large, tender lumps in your neck.  You have a rash.  You cough up green, yellow-brown, or bloody spit. SEEK IMMEDIATE MEDICAL CARE IF:   Your neck becomes stiff.  You drool or are unable to swallow liquids.  You vomit or are unable to keep medicines or liquids down.  You have severe pain that does not go away with the use of recommended medicines.  You have trouble breathing (not caused by a stuffy nose). MAKE SURE YOU:   Understand these instructions.  Will watch your condition.  Will get help right away if you are not doing well or get worse. Document Released: 07/11/2005 Document Revised: 05/01/2013 Document Reviewed: 03/18/2013 [facility] Patient Information 2015 [place], [place]. This information is not intended to replace advice given to you by your health care provider. Make sure you discuss any questions you have with your health care provider.

## 2014-10-27 NOTE — ED Provider Notes (Signed)
CSN: [id number]     Arrival date & time 10/27/14  0104 History   First MD Initiated Contact with Patient 10/27/14 [phone]     Chief Complaint  Patient presents with  . Sore Throat  . Dysphagia     (Consider location/radiation/quality/duration/timing/severity/associated sxs/prior Treatment) HPI Comments: Patient from home with swallowing problems. She states she has a sore throat, states like she is swallowing glass. Patient states that the pain started on Saturday. Patient has been using salt water gargles and honey to help with the sore throat, only helps intermittently.   Patient is a 44 y.o. female presenting with pharyngitis. The history is provided by the patient.  Sore Throat This is a new problem. The current episode started in the past 7 days. The problem occurs constantly. The problem has been gradually worsening. Associated symptoms include congestion and a sore throat. Pertinent negatives include no abdominal pain, anorexia, arthralgias, coughing, diaphoresis, fever, headaches, myalgias, nausea, neck pain, rash, vomiting or weakness. The symptoms are aggravated by eating and drinking. Treatments tried: salt water, honey. The treatment provided mild relief.    Past Medical History  Diagnosis Date  . Elevated blood pressure reading without diagnosis of hypertension    Past Surgical History  Procedure Laterality Date  . Knee surgery  1992    left knee  . Knee arthroscopy  1992    right knee   Family History  Problem Relation Age of Onset  . Arthritis Mother   . Stroke Father   . Hypertension Father   . Diabetes Father   . Heart disease Father   . Hyperlipidemia Father   . Stroke Maternal Grandmother   . Diabetes Maternal Grandmother   . Heart disease Maternal Grandfather   . Diabetes Paternal Grandmother   . Diabetes Paternal Grandfather   . Heart disease Paternal Grandfather    History  Substance Use Topics  . Smoking status: Never Smoker   . Smokeless tobacco:  Not on file  . Alcohol Use: Yes   OB History    No data available     Review of Systems  Constitutional: Negative for fever and diaphoresis.  HENT: Positive for congestion and sore throat.   Respiratory: Negative for cough.   Gastrointestinal: Negative for nausea, vomiting, abdominal pain and anorexia.  Musculoskeletal: Negative for myalgias, arthralgias and neck pain.  Skin: Negative for rash.  Neurological: Negative for weakness and headaches.  All other systems reviewed and are negative.     Allergies  Dimetapp  Home Medications   Prior to Admission medications   Medication Sig Start Date End Date Taking? Authorizing Provider  acetaminophen (TYLENOL) 500 MG tablet Take 1,500 mg by mouth every 6 (six) hours as needed.    Historical Provider, MD  cephALEXin (KEFLEX) 500 MG capsule Take 1 capsule (500 mg total) by mouth 4 (four) times daily. 06/30/12   Fransico Meadow, PA-C  guaiFENesin (MUCINEX) 600 MG 12 hr tablet Take 1,200 mg by mouth 2 (two) times daily.    Historical Provider, MD  Homeopathic Products [facility] COLD REMEDY) LIQD Take by mouth.    Historical Provider, MD  traMADol (ULTRAM) 50 MG tablet Take 1 tablet (50 mg total) by mouth every 6 (six) hours as needed. 10/23/14   Gregor Hams, MD   BP 152/100 mmHg  Pulse 105  Temp(Src) 98.7 F (37.1 C) (Oral)  Resp 16  Ht 5\' 7"  (1.702 m)  Wt 276 lb 1.6 oz (125.238 kg)  BMI 43.23 kg/m2  SpO2 96% Physical Exam  Constitutional: She is oriented to person, place, and time. She appears well-developed and well-nourished. No distress.  HENT:  Head: Normocephalic and atraumatic.  Right Ear: External ear normal.  Left Ear: External ear normal.  Nose: Nose normal.  Mouth/Throat: Uvula is midline and mucous membranes are normal. No trismus in the jaw. No uvula swelling. Oropharyngeal exudate and posterior oropharyngeal erythema present. No posterior oropharyngeal edema or tonsillar abscesses.  Eyes: Conjunctivae are normal.   Neck: Normal range of motion. Neck supple.  No nuchal rigidity.   Cardiovascular: Normal rate, regular rhythm and normal heart sounds.   Pulmonary/Chest: Effort normal and breath sounds normal. No respiratory distress.  Abdominal: Soft.  Musculoskeletal: Normal range of motion.  Lymphadenopathy:    She has cervical adenopathy.  Neurological: She is alert and oriented to person, place, and time.  Skin: Skin is warm and dry. She is not diaphoretic.  Psychiatric: She has a normal mood and affect.  Nursing note and vitals reviewed.   ED Course  Procedures (including critical care time) Labs Review Labs Reviewed  RAPID STREP SCREEN - Abnormal; Notable for the following:    Streptococcus, Group A Screen (Direct) POSITIVE (*)    All other components within normal limits    Imaging Review No results found.   EKG Interpretation None      MDM   Final diagnoses:  Strep pharyngitis    Filed Vitals:   10/27/14 0109  BP: 152/100  Pulse: 105  Temp: 98.7 F (37.1 C)  Resp: 16   Pt with tonsillar exudate, cervical lymphadenopathy, & dysphagia; diagnosis of strep. Treated in the Ed with steroids, NSAIDs, Pain medication and PCN IM.  Pt appears mildly dehydrated, discussed importance of water rehydration. Presentation non concerning for PTA or infxn spread to soft tissue. No trismus or uvula deviation. Specific return precautions discussed. Pt able to drink water in ED without difficulty with intact air way. Recommended PCP follow up. Patient is stable at time of discharge     Baron Sane, PA-C 10/27/14 [place], MD 10/29/14 [phone]

## 2014-10-27 NOTE — ED Notes (Signed)
Patient from home with swallowing problems.  She states she has a sore throat, states like she is swallowing glass.  Patient states that the pain started on Saturday.  Patient has been using salt water gargles and honey to help with the sore throat, only helps intermittently.

## 2015-07-29 ENCOUNTER — Emergency Department (INDEPENDENT_AMBULATORY_CARE_PROVIDER_SITE_OTHER)
Admission: EM | Admit: 2015-07-29 | Discharge: 2015-07-29 | Disposition: A | Discharge status: Home / Self Care | Payer: Medicaid Other | Source: Home / Self Care

## 2015-07-29 ENCOUNTER — Encounter (HOSPITAL_COMMUNITY): Payer: Self-pay | Admitting: *Deleted

## 2015-07-29 DIAGNOSIS — J020 Streptococcal pharyngitis: Secondary | ICD-10-CM | POA: Diagnosis not present

## 2015-07-29 LAB — POCT RAPID STREP A: STREPTOCOCCUS, GROUP A SCREEN (DIRECT): NEGATIVE

## 2015-07-29 MED ORDER — AMOXICILLIN 500 MG PO CAPS: 500.0000 mg | ORAL_CAPSULE | Freq: Three times a day (TID) | ORAL | Status: DC

## 2015-07-29 NOTE — ED Notes (Signed)
Pt  Reports  Symptoms  Of  sorethroat      Fever    Pain   When  She   Swallows      Recent   Travel  From [place]                Symptoms  Not  releived  By  [facility]  meds

## 2015-07-29 NOTE — ED Provider Notes (Signed)
CSN: [id number]     Arrival date & time 07/29/15  1516 History   None    Chief Complaint  Patient presents with  . Sore Throat   (Consider location/radiation/quality/duration/timing/severity/associated sxs/prior Treatment) Patient is a 45 y.o. female presenting with pharyngitis. The history is provided by the patient and the spouse.  Sore Throat This is a new problem. The current episode started more than 2 days ago (sick in [place], just flew home this am.). The problem has been gradually worsening. Pertinent negatives include no chest pain, no abdominal pain and no shortness of breath. The symptoms are aggravated by swallowing.    Past Medical History  Diagnosis Date  . Elevated blood pressure reading without diagnosis of hypertension    Past Surgical History  Procedure Laterality Date  . Knee surgery  1992    left knee  . Knee arthroscopy  1992    right knee   Family History  Problem Relation Age of Onset  . Arthritis Mother   . Stroke Father   . Hypertension Father   . Diabetes Father   . Heart disease Father   . Hyperlipidemia Father   . Stroke Maternal Grandmother   . Diabetes Maternal Grandmother   . Heart disease Maternal Grandfather   . Diabetes Paternal Grandmother   . Diabetes Paternal Grandfather   . Heart disease Paternal Grandfather    Social History  Substance Use Topics  . Smoking status: Never Smoker   . Smokeless tobacco: None  . Alcohol Use: Yes   OB History    No data available     Review of Systems  Constitutional: Positive for fever and chills.  HENT: Positive for congestion, postnasal drip and sore throat.   Respiratory: Negative.  Negative for shortness of breath.   Cardiovascular: Negative.  Negative for chest pain.  Gastrointestinal: Negative.  Negative for abdominal pain.  Genitourinary: Negative.     Allergies  Dimetapp  Home Medications   Prior to Admission medications   Medication Sig Start Date End Date Taking? Authorizing  Provider  acetaminophen (TYLENOL) 500 MG tablet Take 1,500 mg by mouth every 6 (six) hours as needed for mild pain.     Historical Provider, MD  amoxicillin (AMOXIL) 500 MG capsule Take 1 capsule (500 mg total) by mouth 3 (three) times daily. 07/29/15   Billy Fischer, MD  cephALEXin (KEFLEX) 500 MG capsule Take 1 capsule (500 mg total) by mouth 4 (four) times daily. Patient not taking: Reported on 10/27/2014 06/30/12   Fransico Meadow, PA-C  guaiFENesin (MUCINEX) 600 MG 12 hr tablet Take 1,200 mg by mouth 2 (two) times daily.    Historical Provider, MD  Homeopathic Products [facility] COLD REMEDY) LIQD Take by mouth.    Historical Provider, MD  HYDROcodone-homatropine (HYCODAN) 5-1.5 MG/5ML syrup Take 5 mLs by mouth every 6 (six) hours as needed for cough. 10/27/14   Jennifer Piepenbrink, PA-C  traMADol (ULTRAM) 50 MG tablet Take 1 tablet (50 mg total) by mouth every 6 (six) hours as needed. Patient taking differently: Take 50 mg by mouth every 6 (six) hours as needed for moderate pain.  10/23/14   Gregor Hams, MD   Meds Ordered and Administered this Visit  Medications - No data to display  BP 164/111 mmHg  Pulse 104  Temp(Src) 99 F (37.2 C) (Oral)  Resp 20  SpO2 99% No data found.   Physical Exam  Constitutional: She is oriented to person, place, and time. She appears well-developed  and well-nourished. She appears distressed.  HENT:  Right Ear: External ear normal.  Left Ear: External ear normal.  Mouth/Throat: Uvula is midline and mucous membranes are normal. Posterior oropharyngeal erythema present. No oropharyngeal exudate.  Neck: Normal range of motion. Neck supple.  Cardiovascular: Normal heart sounds.   Pulmonary/Chest: Effort normal and breath sounds normal.  Lymphadenopathy:    She has cervical adenopathy.  Neurological: She is alert and oriented to person, place, and time.  Skin: Skin is warm and dry.  Nursing note and vitals reviewed.   ED Course  Procedures (including  critical care time)  Labs Review Labs Reviewed  POCT RAPID STREP A   Strep neg . Imaging Review No results found.   Visual Acuity Review  Right Eye Distance:   Left Eye Distance:   Bilateral Distance:    Right Eye Near:   Left Eye Near:    Bilateral Near:         MDM   1. Strep throat    Will treat as strep based on physical.    Billy Fischer, MD 07/29/15 [phone]

## 2015-07-31 LAB — CULTURE, GROUP A STREP

## 2015-09-04 ENCOUNTER — Encounter (HOSPITAL_COMMUNITY): Payer: Self-pay | Admitting: Emergency Medicine

## 2015-09-04 ENCOUNTER — Emergency Department (HOSPITAL_COMMUNITY)
Admission: EM | Admit: 2015-09-04 | Discharge: 2015-09-04 | Disposition: A | Discharge status: Home / Self Care | Payer: Medicaid Other | Source: Home / Self Care | Attending: Emergency Medicine | Admitting: Emergency Medicine

## 2015-09-04 DIAGNOSIS — S91209A Unspecified open wound of unspecified toe(s) with damage to nail, initial encounter: Secondary | ICD-10-CM

## 2015-09-04 MED ORDER — LIDOCAINE HCL 2 % IJ SOLN
INTRAMUSCULAR | Status: AC
  Filled 2015-09-04: qty 20

## 2015-09-04 NOTE — ED Notes (Signed)
The patient presented to the [facility] with a complaint of pain to her left great toe. The patient denied any trauma and stated that it started hurting today when she was walking.

## 2015-09-04 NOTE — ED Provider Notes (Signed)
CSN: [id number]     Arrival date & time 09/04/15  1841 History   First MD Initiated Contact with Patient 09/04/15 1907     Chief Complaint  Patient presents with  . Toe Pain   (Consider location/radiation/quality/duration/timing/severity/associated sxs/prior Treatment) HPI  She is a 45 year old woman here for evaluation of left great toe pain. She states she suddenly developed severe pain of her left great toe. Pain is located in the distal aspect of the nail. No known injury or trauma. When she removed her sock she noticed some drainage from the area. She did get a pedicure 2 weeks ago at a new place and has had some mild discomfort since then. She has tried Epsom salts soaks without improvement.  Past Medical History  Diagnosis Date  . Elevated blood pressure reading without diagnosis of hypertension    Past Surgical History  Procedure Laterality Date  . Knee surgery  1992    left knee  . Knee arthroscopy  1992    right knee   Family History  Problem Relation Age of Onset  . Arthritis Mother   . Stroke Father   . Hypertension Father   . Diabetes Father   . Heart disease Father   . Hyperlipidemia Father   . Stroke Maternal Grandmother   . Diabetes Maternal Grandmother   . Heart disease Maternal Grandfather   . Diabetes Paternal Grandmother   . Diabetes Paternal Grandfather   . Heart disease Paternal Grandfather    Social History  Substance Use Topics  . Smoking status: Never Smoker   . Smokeless tobacco: None  . Alcohol Use: Yes   OB History    No data available     Review of Systems As in history of present illness Allergies  Dimetapp  Home Medications   Prior to Admission medications   Medication Sig Start Date End Date Taking? Authorizing Provider  acetaminophen (TYLENOL) 500 MG tablet Take 1,500 mg by mouth every 6 (six) hours as needed for mild pain.     Historical Provider, MD  amoxicillin (AMOXIL) 500 MG capsule Take 1 capsule (500 mg total) by mouth 3  (three) times daily. 07/29/15   Billy Fischer, MD  guaiFENesin (MUCINEX) 600 MG 12 hr tablet Take 1,200 mg by mouth 2 (two) times daily.    Historical Provider, MD  Homeopathic Products [facility] COLD REMEDY) LIQD Take by mouth.    Historical Provider, MD  HYDROcodone-homatropine (HYCODAN) 5-1.5 MG/5ML syrup Take 5 mLs by mouth every 6 (six) hours as needed for cough. 10/27/14   Jennifer Piepenbrink, PA-C  traMADol (ULTRAM) 50 MG tablet Take 1 tablet (50 mg total) by mouth every 6 (six) hours as needed. Patient taking differently: Take 50 mg by mouth every 6 (six) hours as needed for moderate pain.  10/23/14   Gregor Hams, MD   Meds Ordered and Administered this Visit  Medications - No data to display  BP 160/91 mmHg  Pulse 96  Temp(Src) 98.4 F (36.9 C) (Oral)  Resp 16  SpO2 97% No data found.   Physical Exam  Constitutional: She is oriented to person, place, and time. She appears well-developed and well-nourished. No distress.  Cardiovascular: Normal rate.   Pulmonary/Chest: Effort normal.  Neurological: She is alert and oriented to person, place, and time.  Skin:  Left great toe: No erythema or edema. The distal nail is raised with what appears to be a clot underneath it. She is quite tender.    ED Course  .  Nail Removal Date/Time: 09/04/2015 8:05 PM Performed by: Melony Overly Authorized by: Melony Overly Consent: Verbal consent obtained. Risks and benefits: risks, benefits and alternatives were discussed Consent given by: patient Patient understanding: patient states understanding of the procedure being performed Patient identity confirmed: verbally with patient Time out: Immediately prior to procedure a "time out" was called to verify the correct patient, procedure, equipment, support staff and site/side marked as required. Location: left foot Location details: left big toe Anesthesia: digital block Local anesthetic: lidocaine 2% without epinephrine Anesthetic total: 7  ml Preparation: skin prepped with Betadine Amount removed: complete Dressing: 4x4 and antibiotic ointment Patient tolerance: Patient tolerated the procedure well with no immediate complications   (including critical care time)  Labs Review Labs Reviewed - No data to display  Imaging Review No results found.    MDM   1. Toenail avulsion, initial encounter    I removed the rest of the toenail. Wound care discussed. Follow-up as needed.    Melony Overly, MD 09/04/15 2006

## 2015-09-04 NOTE — Discharge Instructions (Signed)
Somehow you partially tore your toenail off. We removed the toenail today. This should help significantly with the pain. Soak the toe in soap and water for 10 minutes twice a day. Apply Neosporin for the next 2 days. You will likely want to cover it with a bandage, but you'll be able to wear shoes on Monday. Follow-up as needed.

## 2015-12-07 ENCOUNTER — Encounter: Payer: Self-pay | Admitting: Family Medicine

## 2015-12-07 ENCOUNTER — Ambulatory Visit (INDEPENDENT_AMBULATORY_CARE_PROVIDER_SITE_OTHER): Payer: 59 | Admitting: Family Medicine

## 2015-12-07 VITALS — BP 151/98 | HR 99 | Temp 98.3°F | Ht 67.0 in | Wt 291.7 lb

## 2015-12-07 DIAGNOSIS — I10 Essential (primary) hypertension: Secondary | ICD-10-CM

## 2015-12-07 DIAGNOSIS — L732 Hidradenitis suppurativa: Secondary | ICD-10-CM

## 2015-12-07 LAB — LIPID PANEL
CHOL/HDL RATIO: 3.9 ratio (ref <=5.0)
Cholesterol: 157 mg/dL (ref 125–200)
HDL: 40 mg/dL — AB (ref >=46)
LDL CALC: 100 mg/dL (ref <130)
Triglycerides: 86 mg/dL (ref <150)
VLDL: 17 mg/dL (ref <30)

## 2015-12-07 LAB — COMPLETE METABOLIC PANEL WITH GFR
ALT: 17 U/L (ref 6–29)
AST: 16 U/L (ref 10–35)
Albumin: 4 g/dL (ref 3.6–5.1)
Alkaline Phosphatase: 58 U/L (ref 33–115)
BUN: 9 mg/dL (ref 7–25)
CALCIUM: 8.9 mg/dL (ref 8.6–10.2)
CHLORIDE: 102 mmol/L (ref 98–110)
CO2: 25 mmol/L (ref 20–31)
CREATININE: 0.72 mg/dL (ref 0.50–1.10)
Glucose, Bld: 90 mg/dL (ref 65–99)
POTASSIUM: 4 mmol/L (ref 3.5–5.3)
Sodium: 139 mmol/L (ref 135–146)
Total Bilirubin: 0.4 mg/dL (ref 0.2–1.2)
Total Protein: 7.4 g/dL (ref 6.1–8.1)

## 2015-12-07 LAB — POCT GLYCOSYLATED HEMOGLOBIN (HGB A1C): HEMOGLOBIN A1C: 5.8

## 2015-12-07 MED ORDER — AMLODIPINE BESYLATE 5 MG PO TABS: 5.0000 mg | ORAL_TABLET | Freq: Every day | ORAL | Status: DC

## 2015-12-07 MED ORDER — CEPHALEXIN 500 MG PO CAPS: 500.0000 mg | ORAL_CAPSULE | Freq: Two times a day (BID) | ORAL | Status: DC

## 2015-12-07 NOTE — Assessment & Plan Note (Signed)
Likely boils represent hydradenitis No drainable collection of fluid Keflex BID x10 day F/u in 1 wk to ensure improvement

## 2015-12-07 NOTE — Progress Notes (Signed)
   Subjective:   Stacy Campos is a 45 y.o. female with a history of elevated BP without dx of HTN here for establishing care.  Elevated BP - Medications: never been on any medications, does take tumeric - Checking BP at home: occasionally, was [id number] and A999333 systolic this weekend - Denies any SOB, CP, vision changes, LE edema, medication SEs, or symptoms of hypotension - positive for headaches intermittently - Diet: "sucks", doesn't eat regularly - Exercise: walks up and down stairs regularly, 3 mile walk on saturdays - stress levels very high currently with boils, sister and mother on hospice, niece died in 05-Oct-2022  IUD - placed >48yrs ago - end of last year getting knots of abd that she thinks is related to IUD - self resolve - not sexually active - does not want contraception after removal  Boils - under L arm - not draining - get really big - tried boil ease and fat back - never had them before - started in last 6 months - these present x1wk  Review of Systems:  Per HPI.   Social History: never smoker  Objective:  BP 151/98 mmHg  Pulse 99  Temp(Src) 98.3 F (36.8 C) (Oral)  Ht 5\' 7"  (1.702 m)  Wt 291 lb 11.2 oz (132.314 kg)  BMI 45.68 kg/m2  SpO2 100%  Gen:  45 y.o. female in NAD HEENT: NCAT, MMM, EOMI, PERRL, anicteric sclerae CV: RRR, no MRG Resp: Non-labored, CTAB, no wheezes noted Abd: Soft, NTND, BS present, no guarding or organomegaly Ext: WWP, no edema MSK: Small indurated areas under L axilla, no fluctuance or drainable fluid collections Neuro: Alert and oriented, speech normal     Assessment & Plan:     Stacy Campos is a 45 y.o. female here for  HTN (hypertension) BP consistently elevated for years Start amlodipine 5mg  daily CMET, lipids, A1c today F/u in 1 month  Hydradenitis Likely boils represent hydradenitis No drainable collection of fluid Keflex BID x10 day F/u in 1 wk to ensure improvement      CPE with IUD removal and BP f/u  in 1 month   [name], MD MPH PGY-2,  [facility] 12/07/2015  12:04 PM

## 2015-12-07 NOTE — Patient Instructions (Signed)
Hypertension Hypertension, commonly called high blood pressure, is when the force of blood pumping through your arteries is too strong. Your arteries are the blood vessels that carry blood from your heart throughout your body. A blood pressure reading consists of a higher number over a lower number, such as 110/72. The higher number (systolic) is the pressure inside your arteries when your heart pumps. The lower number (diastolic) is the pressure inside your arteries when your heart relaxes. Ideally you want your blood pressure below 120/80. Hypertension forces your heart to work harder to pump blood. Your arteries may become narrow or stiff. Having untreated or uncontrolled hypertension can cause heart attack, stroke, kidney disease, and other problems. RISK FACTORS Some risk factors for high blood pressure are controllable. Others are not.  Risk factors you cannot control include:   Race. You may be at higher risk if you are African American.  Age. Risk increases with age.  Gender. Men are at higher risk than women before age 45 years. After age 65, women are at higher risk than men. Risk factors you can control include:  Not getting enough exercise or physical activity.  Being overweight.  Getting too much fat, sugar, calories, or salt in your diet.  Drinking too much alcohol. SIGNS AND SYMPTOMS Hypertension does not usually cause signs or symptoms. Extremely high blood pressure (hypertensive crisis) may cause headache, anxiety, shortness of breath, and nosebleed. DIAGNOSIS To check if you have hypertension, your health care provider will measure your blood pressure while you are seated, with your arm held at the level of your heart. It should be measured at least twice using the same arm. Certain conditions can cause a difference in blood pressure between your right and left arms. A blood pressure reading that is higher than normal on one occasion does not mean that you need treatment. If  it is not clear whether you have high blood pressure, you may be asked to return on a different day to have your blood pressure checked again. Or, you may be asked to monitor your blood pressure at home for 1 or more weeks. TREATMENT Treating high blood pressure includes making lifestyle changes and possibly taking medicine. Living a healthy lifestyle can help lower high blood pressure. You may need to change some of your habits. Lifestyle changes may include:  Following the DASH diet. This diet is high in fruits, vegetables, and whole grains. It is low in salt, red meat, and added sugars.  Keep your sodium intake below 2,300 mg per day.  Getting at least 30-45 minutes of aerobic exercise at least 4 times per week.  Losing weight if necessary.  Not smoking.  Limiting alcoholic beverages.  Learning ways to reduce stress. Your health care provider may prescribe medicine if lifestyle changes are not enough to get your blood pressure under control, and if one of the following is true:  You are 18-59 years of age and your systolic blood pressure is above 140.  You are 60 years of age or older, and your systolic blood pressure is above 150.  Your diastolic blood pressure is above 90.  You have diabetes, and your systolic blood pressure is over 140 or your diastolic blood pressure is over 90.  You have kidney disease and your blood pressure is above 140/90.  You have heart disease and your blood pressure is above 140/90. Your personal target blood pressure may vary depending on your medical conditions, your age, and other factors. HOME CARE INSTRUCTIONS    Have your blood pressure rechecked as directed by your health care provider.   Take medicines only as directed by your health care provider. Follow the directions carefully. Blood pressure medicines must be taken as prescribed. The medicine does not work as well when you skip doses. Skipping doses also puts you at risk for  problems.  Do not smoke.   Monitor your blood pressure at home as directed by your health care provider. SEEK MEDICAL CARE IF:   You think you are having a reaction to medicines taken.  You have recurrent headaches or feel dizzy.  You have swelling in your ankles.  You have trouble with your vision. SEEK IMMEDIATE MEDICAL CARE IF:  You develop a severe headache or confusion.  You have unusual weakness, numbness, or feel faint.  You have severe chest or abdominal pain.  You vomit repeatedly.  You have trouble breathing. MAKE SURE YOU:   Understand these instructions.  Will watch your condition.  Will get help right away if you are not doing well or get worse.   This information is not intended to replace advice given to you by your health care provider. Make sure you discuss any questions you have with your health care provider.   Document Released: 07/11/2005 Document Revised: 11/25/2014 Document Reviewed: 05/03/2013 Elsevier Interactive Patient Education 2016 [employer]  

## 2015-12-07 NOTE — Assessment & Plan Note (Signed)
BP consistently elevated for years Start amlodipine 5mg  daily CMET, lipids, A1c today F/u in 1 month

## 2015-12-09 ENCOUNTER — Encounter: Payer: Self-pay | Admitting: Family Medicine

## 2015-12-14 ENCOUNTER — Ambulatory Visit (INDEPENDENT_AMBULATORY_CARE_PROVIDER_SITE_OTHER): Payer: 59 | Admitting: Family Medicine

## 2015-12-14 ENCOUNTER — Ambulatory Visit: Payer: Medicaid Other | Admitting: Family Medicine

## 2015-12-14 VITALS — BP 140/90 | HR 99 | Temp 98.5°F | Ht 67.0 in | Wt 292.6 lb

## 2015-12-14 DIAGNOSIS — I10 Essential (primary) hypertension: Secondary | ICD-10-CM

## 2015-12-14 DIAGNOSIS — L732 Hidradenitis suppurativa: Secondary | ICD-10-CM | POA: Diagnosis not present

## 2015-12-14 DIAGNOSIS — R350 Frequency of micturition: Secondary | ICD-10-CM | POA: Diagnosis not present

## 2015-12-14 LAB — POCT URINALYSIS DIPSTICK
BILIRUBIN UA: NEGATIVE
Blood, UA: NEGATIVE
GLUCOSE UA: NEGATIVE
LEUKOCYTES UA: NEGATIVE
Nitrite, UA: NEGATIVE
Protein, UA: NEGATIVE
Spec Grav, UA: 1.025
Urobilinogen, UA: 0.2
pH, UA: 5.5

## 2015-12-14 MED ORDER — CLINDAMYCIN PHOSPHATE 1 % EX GEL: Freq: Two times a day (BID) | CUTANEOUS | Status: DC

## 2015-12-14 NOTE — Patient Instructions (Addendum)
Thank you so much for coming to visit with me today! Please continue to take your Amlodipine daily. Please check your blood pressure daily after sitting for at least 53minutes and write your numbers down in a notebook. Please bring your notebook to your appointment with Dr. Jacinto Reap.  I'm glad your boils are improving. Please continue your antibiotics. I am also sending an antibiotic gel to the pharmacy for you to use twice daily as needed for flares.   Thanks again! Dr. Gerlean Ren  Hidradenitis Suppurativa Hidradenitis suppurativa is a long-term (chronic) skin disease that starts with blocked sweat glands or hair follicles. Bacteria may grow in these blocked openings of your skin. Hidradenitis suppurativa is like a severe form of acne that develops in areas of your body where acne would be unusual. It is most likely to affect the areas of your body where skin rubs against skin and becomes moist. This includes your:  Underarms.  Groin.  Genital areas.  Buttocks.  Upper thighs.  Breasts. Hidradenitis suppurativa may start out with small pimples. The pimples can develop into deep sores that break open (rupture) and drain pus. Over time your skin may thicken and become scarred. Hidradenitis suppurativa cannot be passed from person to person.  CAUSES  The exact cause of hidradenitis suppurativa is not known. This condition may be due to:  Female and female hormones. The condition is rare before and after puberty.  An overactive body defense system (immune system). Your immune system may overreact to the blocked hair follicles or sweat glands and cause swelling and pus-filled sores. RISK FACTORS You may have a higher risk of hidradenitis suppurativa if you:  Are a woman.  Are between ages 36 and 16.  Have a family history of hidradenitis suppurativa.  Have a personal history of acne.  Are overweight.  Smoke.  Take the drug lithium. SIGNS AND SYMPTOMS  The first signs of an outbreak are  usually painful skin bumps that look like pimples. As the condition progresses:  Skin bumps may get bigger and grow deeper into the skin.  Bumps under the skin may rupture and drain smelly pus.  Skin may become itchy and infected.  Skin may thicken and scar.  Drainage may continue through tunnels under the skin (fistulas).  Walking and moving your arms can become painful. DIAGNOSIS  Your health care provider may diagnose hidradenitis suppurativa based on your medical history and your signs and symptoms. A physical exam will also be done. You may need to see a health care provider who specializes in skin diseases (dermatologist). You may also have tests done to confirm the diagnosis. These can include:  Swabbing a sample of pus or drainage from your skin so it can be sent to the lab and tested for infection.  Blood tests to check for infection. TREATMENT  The same treatment will not work for everybody with hidradenitis suppurativa. Your treatment will depend on how severe your symptoms are. You may need to try several treatments to find what works best for you. Part of your treatment may include cleaning and bandaging (dressing) your wounds. You may also have to take medicines, such as the following:  Antibiotics.  Acne medicines.  Medicines to block or suppress the immune system.  A diabetes medicine (metformin) is sometimes used to treat this condition.  For women, birth control pills can sometimes help relieve symptoms. You may need surgery if you have a severe case of hidradenitis suppurativa that does not respond to medicine. Surgery  may involve:   Using a laser to clear the skin and remove hair follicles.  Opening and draining deep sores.  Removing the areas of skin that are diseased and scarred. HOME CARE INSTRUCTIONS  Learn as much as you can about your disease, and work closely with your health care providers.  Take medicines only as directed by your health care  provider.  If you were prescribed an antibiotic medicine, finish it all even if you start to feel better.  If you are overweight, losing weight may be very helpful. Try to reach and maintain a healthy weight.  Do not use any tobacco products, including cigarettes, chewing tobacco, or electronic cigarettes. If you need help quitting, ask your health care provider.  Do not shave the areas where you get hidradenitis suppurativa.  Do not wear deodorant.  Wear loose-fitting clothes.  Try not to overheat and get sweaty.  Take a daily bleach bath as directed by your health care provider.  Fill your bathtub halfway with water.  Pour in  cup of unscented household bleach.  Soak for 5-10 minutes.  Cover sore areas with a warm, clean washcloth (compress) for 5-10 minutes. SEEK MEDICAL CARE IF:   You have a flare-up of hidradenitis suppurativa.  You have chills or a fever.  You are having trouble controlling your symptoms at home.   This information is not intended to replace advice given to you by your health care provider. Make sure you discuss any questions you have with your health care provider.   Document Released: 02/23/2004 Document Revised: 08/01/2014 Document Reviewed: 10/11/2013 Elsevier Interactive Patient Education [employer].

## 2015-12-17 ENCOUNTER — Telehealth: Payer: Self-pay | Admitting: *Deleted

## 2015-12-17 ENCOUNTER — Encounter: Payer: Self-pay | Admitting: Student

## 2015-12-17 ENCOUNTER — Ambulatory Visit (INDEPENDENT_AMBULATORY_CARE_PROVIDER_SITE_OTHER): Payer: 59 | Admitting: Student

## 2015-12-17 VITALS — BP 149/96 | HR 92 | Temp 98.2°F | Ht 67.0 in | Wt 295.0 lb

## 2015-12-17 DIAGNOSIS — L732 Hidradenitis suppurativa: Secondary | ICD-10-CM | POA: Diagnosis not present

## 2015-12-17 MED ORDER — DOXYCYCLINE HYCLATE 50 MG PO CAPS: 100.0000 mg | ORAL_CAPSULE | Freq: Two times a day (BID) | ORAL | Status: DC

## 2015-12-17 MED ORDER — TRAMADOL HCL 50 MG PO TABS: 50.0000 mg | ORAL_TABLET | Freq: Three times a day (TID) | ORAL | Status: DC | PRN

## 2015-12-17 NOTE — Patient Instructions (Addendum)
You had an incision and drainage today. If you start to have fevers, worsening pain, call the office or go to the ED for evaluation. Your antibiotic will be changed to Doxycycline. Please take this twice a day. Follow up tomorrow for wound check   Hidradenitis Suppurativa Hidradenitis suppurativa is a long-term (chronic) skin disease that starts with blocked sweat glands or hair follicles. Bacteria may grow in these blocked openings of your skin. Hidradenitis suppurativa is like a severe form of acne that develops in areas of your body where acne would be unusual. It is most likely to affect the areas of your body where skin rubs against skin and becomes moist. This includes your:  Underarms.  Groin.  Genital areas.  Buttocks.  Upper thighs.  Breasts. Hidradenitis suppurativa may start out with small pimples. The pimples can develop into deep sores that break open (rupture) and drain pus. Over time your skin may thicken and become scarred. Hidradenitis suppurativa cannot be passed from person to person.  CAUSES  The exact cause of hidradenitis suppurativa is not known. This condition may be due to:  Female and female hormones. The condition is rare before and after puberty.  An overactive body defense system (immune system). Your immune system may overreact to the blocked hair follicles or sweat glands and cause swelling and pus-filled sores. RISK FACTORS You may have a higher risk of hidradenitis suppurativa if you:  Are a woman.  Are between ages 66 and 71.  Have a family history of hidradenitis suppurativa.  Have a personal history of acne.  Are overweight.  Smoke.  Take the drug lithium. SIGNS AND SYMPTOMS  The first signs of an outbreak are usually painful skin bumps that look like pimples. As the condition progresses:  Skin bumps may get bigger and grow deeper into the skin.  Bumps under the skin may rupture and drain smelly pus.  Skin may become itchy and  infected.  Skin may thicken and scar.  Drainage may continue through tunnels under the skin (fistulas).  Walking and moving your arms can become painful. DIAGNOSIS  Your health care provider may diagnose hidradenitis suppurativa based on your medical history and your signs and symptoms. A physical exam will also be done. You may need to see a health care provider who specializes in skin diseases (dermatologist). You may also have tests done to confirm the diagnosis. These can include:  Swabbing a sample of pus or drainage from your skin so it can be sent to the lab and tested for infection.  Blood tests to check for infection. TREATMENT  The same treatment will not work for everybody with hidradenitis suppurativa. Your treatment will depend on how severe your symptoms are. You may need to try several treatments to find what works best for you. Part of your treatment may include cleaning and bandaging (dressing) your wounds. You may also have to take medicines, such as the following:  Antibiotics.  Acne medicines.  Medicines to block or suppress the immune system.  A diabetes medicine (metformin) is sometimes used to treat this condition.  For women, birth control pills can sometimes help relieve symptoms. You may need surgery if you have a severe case of hidradenitis suppurativa that does not respond to medicine. Surgery may involve:   Using a laser to clear the skin and remove hair follicles.  Opening and draining deep sores.  Removing the areas of skin that are diseased and scarred. HOME CARE INSTRUCTIONS  Learn as much as  you can about your disease, and work closely with your health care providers.  Take medicines only as directed by your health care provider.  If you were prescribed an antibiotic medicine, finish it all even if you start to feel better.  If you are overweight, losing weight may be very helpful. Try to reach and maintain a healthy weight.  Do not use any  tobacco products, including cigarettes, chewing tobacco, or electronic cigarettes. If you need help quitting, ask your health care provider.  Do not shave the areas where you get hidradenitis suppurativa.  Do not wear deodorant.  Wear loose-fitting clothes.  Try not to overheat and get sweaty.  Take a daily bleach bath as directed by your health care provider.  Fill your bathtub halfway with water.  Pour in  cup of unscented household bleach.  Soak for 5-10 minutes.  Cover sore areas with a warm, clean washcloth (compress) for 5-10 minutes. SEEK MEDICAL CARE IF:   You have a flare-up of hidradenitis suppurativa.  You have chills or a fever.  You are having trouble controlling your symptoms at home.   This information is not intended to replace advice given to you by your health care provider. Make sure you discuss any questions you have with your health care provider.   Document Released: 02/23/2004 Document Revised: 08/01/2014 Document Reviewed: 10/11/2013 Elsevier Interactive Patient Education [employer].

## 2015-12-17 NOTE — Assessment & Plan Note (Signed)
-   Elevated to 156/101 - Continue Amlodipine as prescribed. Did not titrate up today given increased urinary frequency, which can occur in <1% of those on Amlodipine. Wishes to continue Amlodipine and not transition to another medication. - To check BP at home once daily and record in log. To bring log to next visit with PCP. - Will obtain urinalysis to further evaluate urinary frequency - Follow up with PCP as scheduled.

## 2015-12-17 NOTE — Progress Notes (Signed)
   Subjective:    Patient ID: Stacy Campos, female    DOB: 03-Jul-1971, 45 y.o.   MRN: [id number]   CC: Boils of left arm pit  HPI:  45 y/o F presenting for follow up of left arm boils  Left arm boils - Seen three days ago when she was given clindamycin gel in addition to keflex as prescribed on 5/15 - reports compliance with her medication regimen but her underarm infection is worse to her and more painful - feels she developed larger boils yesterday 5/24 - denies fever, nausea or vomiting  Smoking status reviewed  Review of Systems Denies chest pain , SOB    Objective:  BP 149/96 mmHg  Pulse 92  Temp(Src) 98.2 F (36.8 C) (Oral)  Ht 5\' 7"  (1.702 m)  Wt 295 lb (133.811 kg)  BMI 46.19 kg/m2  SpO2 97% Vitals and nursing note reviewed  General: NAD Cardiac: RRR, normal heart sounds, Respiratory: CTAB, normal effort MSK: two indurated areas, one approx 3 cm in greatest diameter with 1 cm area of superficial fluctuance, 1 cm area of induration with questionable fluctuance Neuro: alert and oriented   Assessment & Plan:    Hydradenitis Concern for worsening hydradenitis with focal areas of abscess. Abscess I&D in office ( see procedure note). Given minimal improvement with keflex she will be transitioned to doxycycline which has been shown to be efficatious in the treatment of HS (hydradenitis suppurativa),[ The Ring et al. Microbiology of Hidradenitis Suppurativa.Dermatol Clin. 2016 Jan;34(1):29-35. doi: 10.1016/j.det.2015.08.010.] - doxycycline 100 BID x10 days - follow up tomorrow for wound check and packing removal or change as needed - will need teaching on packing changes if goes home with packing over the weekend - ED precautions discussed       Stacy Campos A. Lincoln Brigham MD, [facility] Resident PGY-2 Pager [phone]

## 2015-12-17 NOTE — Telephone Encounter (Signed)
Patient states she is now in "excruciating pain" with left axilla boil. States size of boil has increased since last evening. Same day appt given today at 1:30. Velora Heckler, RN

## 2015-12-17 NOTE — Progress Notes (Signed)
PROCEDURE NOTE: Written consent obtained. Risks and benefits of the procedure were explained to the patient. Patient made an informed decision to proceed with the procedure. Betadine prep per usual protocol. Local anesthesia obtained with 2% lidocaine with epi 5 cc.  1cm incision made with 11 blade along the largest abscess Mild purulence expressed. Lesion explored revealing no loculations. Packed with 10 cm plain packing.  The smaller abscess was incised with a 0.5 cm incision with minimal purulence drained Both wounds were dressed. Wound care instructions including precautions with patient. Patient tolerated the procedure well. Recheck in 24 hours.  Ahliya Glatt A. Lincoln Brigham MD, [facility] Resident PGY-2 Pager [phone]

## 2015-12-17 NOTE — Assessment & Plan Note (Signed)
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 - Prescription for Topical Clindamycin given - Handout given

## 2015-12-17 NOTE — Assessment & Plan Note (Signed)
Concern for worsening hydradenitis with focal areas of abscess. Abscess I&D in office ( see procedure note). Given minimal improvement with keflex she will be transitioned to doxycycline which has been shown to be efficatious in the treatment of HS (hydradenitis suppurativa),[ The Ring et al. Microbiology of Hidradenitis Suppurativa.Dermatol Clin. 2016 Jan;34(1):29-35. doi: 10.1016/j.det.2015.08.010.] - doxycycline 100 BID x10 days - follow up tomorrow for wound check and packing removal or change as needed - will need teaching on packing changes if goes home with packing over the weekend - ED precautions discussed

## 2015-12-17 NOTE — Progress Notes (Signed)
Subjective:     Patient ID: Stacy Campos, female   DOB: 12-25-1970, 45 y.o.   MRN: [id number]  HPI Stacy Campos isa  45yo female presenting for follow up of Hydradenitis. - Was seen on 5/15 and diagnosed with Hydradenitis in the left axilla. Prescribed Keflex for 10 day course. - Notes significant improvement in size and tenderness of boils in left axilla with antibiotics - Has been taking antibiotics as prescribed. Last dose scheduled for 5/25. - Denies drainage of boils, redness, fever  # Hypertension: - Was seen on 5/15 and prescribed Amlodipine 5mg  - Has been taking Amlodipine as prescribed - Reports increased urine frequency since starting Amlodipine - Wishes to continue Amlodipine despite adverse effects - Denies dysuria, abdominal pain, urinary urgency - Denies shortness of breath, chest pain, blurred vision, headache - Checks her mothers blood pressure once daily, so she has access to blood pressure cuff  Review of Systems Per HPI. Other systems negative.    Objective:   Physical Exam  Constitutional: She appears well-developed and well-nourished. No distress.  HENT:  Head: Normocephalic and atraumatic.  Cardiovascular: Normal rate and regular rhythm.   Pulmonary/Chest: Effort normal. No respiratory distress. She has no wheezes.  Skin:  Tenderness of left axilla noted; small boil with tenderness in left axilla, no fluctuance or drainage noted.  Psychiatric: She has a normal mood and affect. Her behavior is normal.       Assessment and Plan:     Hydradenitis - Suspect Hurley Stage I - Continue does of Keflex as prescribed. - Prescription for Topical Clindamycin given - Handout given  HTN (hypertension) - Elevated to 156/101 - Continue Amlodipine as prescribed. Did not titrate up today given increased urinary frequency, which can occur in <1% of those on Amlodipine. Wishes to continue Amlodipine and not transition to another medication. - To check BP at home once  daily and record in log. To bring log to next visit with PCP. - Will obtain urinalysis to further evaluate urinary frequency - Follow up with PCP as scheduled.

## 2015-12-18 ENCOUNTER — Encounter: Payer: Self-pay | Admitting: Family Medicine

## 2015-12-18 ENCOUNTER — Ambulatory Visit (INDEPENDENT_AMBULATORY_CARE_PROVIDER_SITE_OTHER): Payer: Medicaid Other | Admitting: Family Medicine

## 2015-12-18 VITALS — BP 151/96 | HR 101 | Temp 98.3°F | Ht 67.0 in | Wt 294.0 lb

## 2015-12-18 DIAGNOSIS — L732 Hidradenitis suppurativa: Secondary | ICD-10-CM

## 2015-12-18 NOTE — Progress Notes (Signed)
Patient ID: Stacy Campos, female   DOB: 1971/02/06, 45 y.o.   MRN: [id number]   [facility] Aquilla Hacker, MD Phone: [phone]  Subjective:   # Hydradenitis Left Arm - seen yesterday.  - Incised with exploration and packed with antibiotic packing.  - Started on doxycycline with clindamycin ointment.  - symptoms are improving, pain is much better.  - Some drainage with antibiotic packing.  - Otherwise no fever, chills.  - Still somewhat tender to touch.   All relevant systems were reviewed and were negative unless otherwise noted in the HPI  Past Medical History Reviewed problem list.  Medications- reviewed and updated Current Outpatient Prescriptions  Medication Sig Dispense Refill  . acetaminophen (TYLENOL) 500 MG tablet Take 1,500 mg by mouth every 6 (six) hours as needed for mild pain.     Marland Kitchen amLODipine (NORVASC) 5 MG tablet Take 1 tablet (5 mg total) by mouth daily. 30 tablet 1  . cholecalciferol (VITAMIN D) 1000 units tablet Take 1,000 Units by mouth daily.    . clindamycin (CLINDAGEL) 1 % gel Apply topically 2 (two) times daily. 30 g 1  . doxycycline (VIBRAMYCIN) 50 MG capsule Take 2 capsules (100 mg total) by mouth 2 (two) times daily. 20 capsule 0  . Multiple Vitamin (MULTIVITAMIN) capsule Take 1 capsule by mouth daily.    . traMADol (ULTRAM) 50 MG tablet Take 1 tablet (50 mg total) by mouth every 6 (six) hours as needed. (Patient taking differently: Take 50 mg by mouth every 6 (six) hours as needed for moderate pain. ) 15 tablet 0  . traMADol (ULTRAM) 50 MG tablet Take 1 tablet (50 mg total) by mouth every 8 (eight) hours as needed. 15 tablet 0   No current facility-administered medications for this visit.   Chief complaint-noted No additions to family history Social history- patient is a non smoker  Objective: There were no vitals taken for this visit. Gen: NAD, alert, cooperative with exam HEENT: NCAT, EOMI, PERRL Neck: FROM,  supple CV: RRR, good S1/S2, no murmur Resp: CTABL, no wheezes, non-labored Abd: SNTND, BS present, no guarding or organomegaly Ext: No edema, warm, normal tone, moves UE/LE spontaneously LU Axilla - Tender / fluctuant areas x 3 with largest around 2x3 cm. Antibiotic packing nearly out with purulent drainage on it. Minimal erythema Neuro: Alert and oriented, No gross deficits Skin: no rashes no lesions, as above  Assessment/Plan:  # Hydradenitis - improving after I&D and with abx - Continue doxycycline. - removed abx packing today.  - Return precautions reviewed.  - F/U with pcp as previously scheduled.  - tramadol / ibuprofen for pain.

## 2015-12-18 NOTE — Patient Instructions (Signed)
Thanks for coming in today.   Let the infection continue to drain.   Take the entire amount of doxycycline.   Follow up with your PCP as previously scheduled.  If your infection gets worse, or if you have worsening pain, fever, or chills, over the weekend then call the after hours line or come to the ED.   Thanks for letting us take care of you.  Sincerely, Paula Compton, [facility] -PGY 2

## 2016-01-19 ENCOUNTER — Other Ambulatory Visit (HOSPITAL_COMMUNITY)
Admission: RE | Admit: 2016-01-19 | Discharge: 2016-01-19 | Disposition: A | Discharge status: Home / Self Care | Payer: 59 | Source: Ambulatory Visit | Attending: Family Medicine | Admitting: Family Medicine

## 2016-01-19 ENCOUNTER — Ambulatory Visit (INDEPENDENT_AMBULATORY_CARE_PROVIDER_SITE_OTHER): Payer: 59 | Admitting: Family Medicine

## 2016-01-19 ENCOUNTER — Encounter: Payer: Self-pay | Admitting: Family Medicine

## 2016-01-19 VITALS — BP 169/93 | HR 102 | Temp 98.7°F | Ht 67.0 in | Wt 295.6 lb

## 2016-01-19 DIAGNOSIS — Z01419 Encounter for gynecological examination (general) (routine) without abnormal findings: Secondary | ICD-10-CM | POA: Insufficient documentation

## 2016-01-19 DIAGNOSIS — T8332XA Displacement of intrauterine contraceptive device, initial encounter: Secondary | ICD-10-CM

## 2016-01-19 DIAGNOSIS — Z1151 Encounter for screening for human papillomavirus (HPV): Secondary | ICD-10-CM | POA: Insufficient documentation

## 2016-01-19 DIAGNOSIS — I10 Essential (primary) hypertension: Secondary | ICD-10-CM

## 2016-01-19 DIAGNOSIS — L732 Hidradenitis suppurativa: Secondary | ICD-10-CM | POA: Diagnosis not present

## 2016-01-19 DIAGNOSIS — Z124 Encounter for screening for malignant neoplasm of cervix: Secondary | ICD-10-CM

## 2016-01-19 DIAGNOSIS — Z30432 Encounter for removal of intrauterine contraceptive device: Secondary | ICD-10-CM | POA: Diagnosis not present

## 2016-01-19 DIAGNOSIS — T8389XA Other specified complication of genitourinary prosthetic devices, implants and grafts, initial encounter: Secondary | ICD-10-CM

## 2016-01-19 NOTE — Progress Notes (Signed)
   Subjective:   Stacy Campos is a 45 y.o. female with a history of Hidradenitis here for hidradenitis follow-up in IUD removal  IUD - placed >7yrs ago - end of last year getting knots of abd that she thinks is related to IUD - self resolved - not sexually active - does not want contraception after removal - last pap smear 2009 - only report in system is "normal"  Hydradenitis - things resolved after I&D and doxycycline - still has hole that is closing up - no drainage or fevers - no redness or tenderness  Review of Systems:  Per HPI.   Social History: Never smoker  Objective:  BP 169/93 mmHg  Pulse 102  Temp(Src) 98.7 F (37.1 C) (Oral)  Ht 5\' 7"  (1.702 m)  Wt 295 lb 9.6 oz (134.083 kg)  BMI 46.29 kg/m2  Gen:  45 y.o. female in NAD HEENT: NCAT, MMM, EOMI, PERRL, anicteric sclerae CV: RRR, no MRG Resp: Non-labored, CTAB, no wheezes noted Abd: Soft, NTND, BS present, no guarding or organomegaly GYN:  External genitalia within normal limits.  Vaginal mucosa pink, moist, normal rugae.  Nonfriable cervix without lesions, no discharge or bleeding noted on speculum exam.  Unable to locate IUD string, even with pap brush. Bimanual exam revealed normal, nongravid uterus.  No cervical motion tenderness. No adnexal masses bilaterally.   Ext: WWP, no edema, left axilla without abscesses, redness, tenderness to palpation, swelling Neuro: Alert and oriented, speech normal     Assessment & Plan:     Stacy Campos is a 45 y.o. female here for   HTN (hypertension) Elevated BP noted after patient left Plan to call patient in next 2 days to discuss Pap smear result. I will discuss blood pressure at that visit and have patient come back for nurse blood pressure check in one week  Hydradenitis Improving well Follow-up as needed  Encounter for IUD removal Unable to locate IUD strings Pelvic and transvaginal ultrasounds to attempt to locate IUD If no IUD located on this exam,  consider abdominal x-ray or the IUD may have fallen out previously If IUD located inside uterus, referral to gynecology indicated Pap smear completed today    [name], MD MPH PGY-2,  [facility] 01/19/2016  5:07 PM

## 2016-01-19 NOTE — Patient Instructions (Signed)
Nice to see you again today. We will call you or send you a letter about your Pap smear results when they're available. As I was unable to find the strings for your IUD today, we will get an ultrasound to try to locate the IUD. I'll let you know the results of this when it's available.  Take care, Dr. Jacinto Reap

## 2016-01-19 NOTE — Assessment & Plan Note (Signed)
Unable to locate IUD strings Pelvic and transvaginal ultrasounds to attempt to locate IUD If no IUD located on this exam, consider abdominal x-ray or the IUD may have fallen out previously If IUD located inside uterus, referral to gynecology indicated Pap smear completed today

## 2016-01-19 NOTE — Assessment & Plan Note (Signed)
Improving well Follow-up as needed

## 2016-01-19 NOTE — Assessment & Plan Note (Signed)
Elevated BP noted after patient left Plan to call patient in next 2 days to discuss Pap smear result. I will discuss blood pressure at that visit and have patient come back for nurse blood pressure check in one week

## 2016-01-21 LAB — CYTOLOGY - PAP

## 2016-01-22 ENCOUNTER — Encounter: Payer: Self-pay | Admitting: Family Medicine

## 2016-02-02 ENCOUNTER — Ambulatory Visit (HOSPITAL_COMMUNITY)
Admission: RE | Admit: 2016-02-02 | Discharge: 2016-02-02 | Disposition: A | Discharge status: Home / Self Care | Payer: 59 | Source: Ambulatory Visit | Attending: Family Medicine | Admitting: Family Medicine

## 2016-02-02 DIAGNOSIS — Z30431 Encounter for routine checking of intrauterine contraceptive device: Secondary | ICD-10-CM | POA: Insufficient documentation

## 2016-02-02 DIAGNOSIS — T8389XA Other specified complication of genitourinary prosthetic devices, implants and grafts, initial encounter: Secondary | ICD-10-CM

## 2016-02-02 DIAGNOSIS — T8332XA Displacement of intrauterine contraceptive device, initial encounter: Secondary | ICD-10-CM

## 2016-02-02 IMAGING — US US PELVIS COMPLETE
1 series · 15 of 25 positions shown · non-contrast
Comparison: No recent pelvic ultrasounds for review.

CLINICAL DATA: Possible migration of IUD ; the retrieval string
cannot be visualized. IUD was placed 5 years ago



[Series 1: us pelvis complete · 15 of 66 slices shown]
[im 1/66]
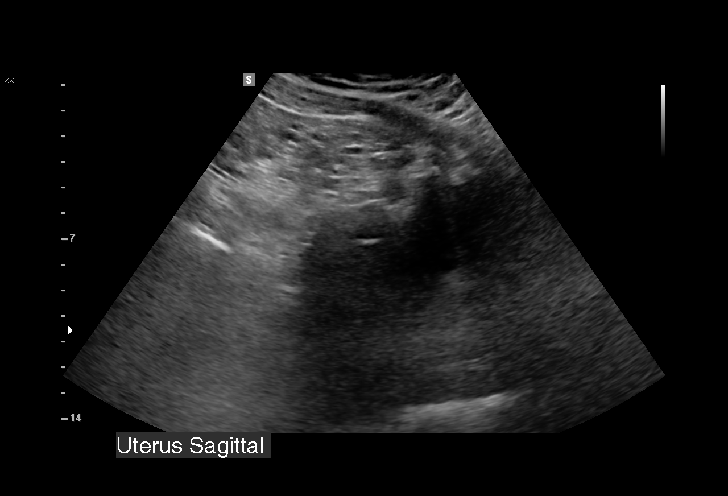
[im 6/66]
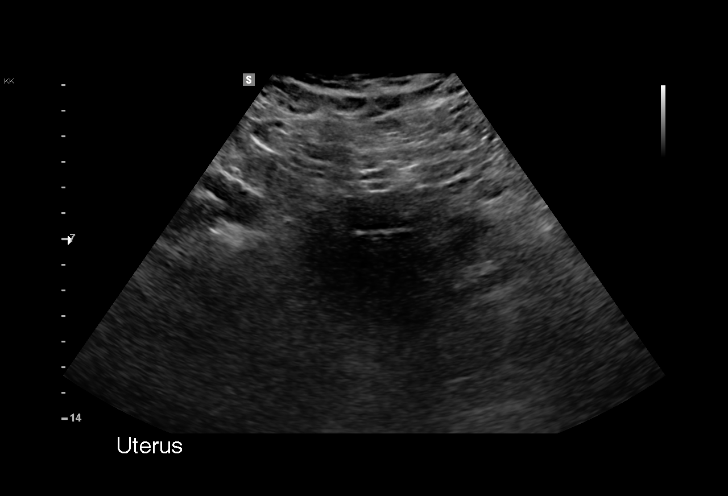
[im 11/66]
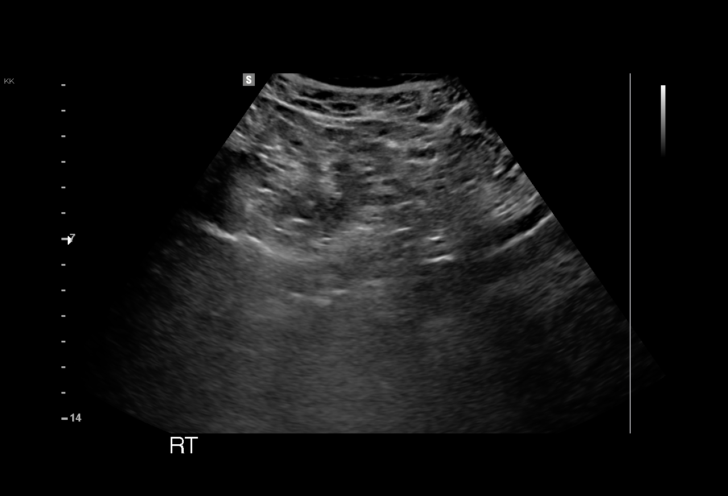
[im 14/66]
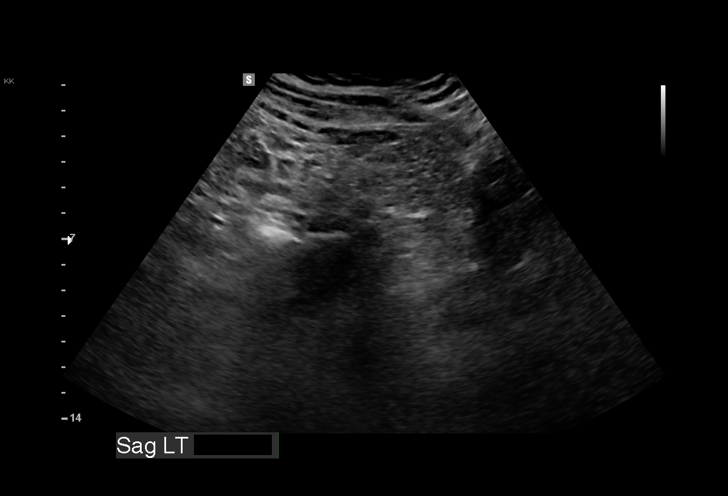
[im 19/66]
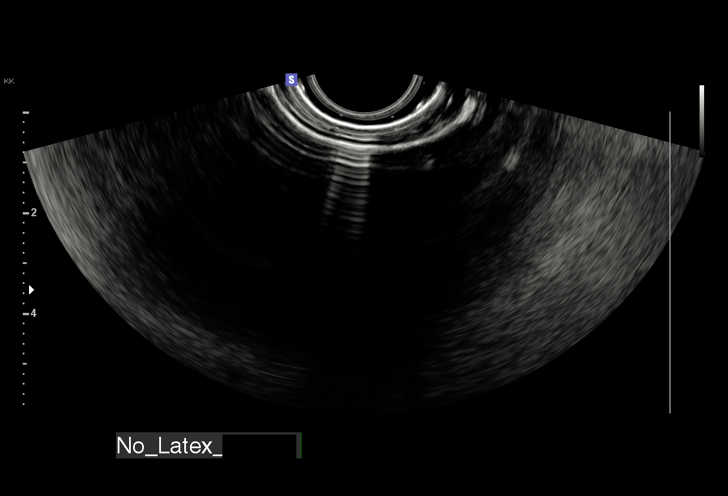
[im 25/66]
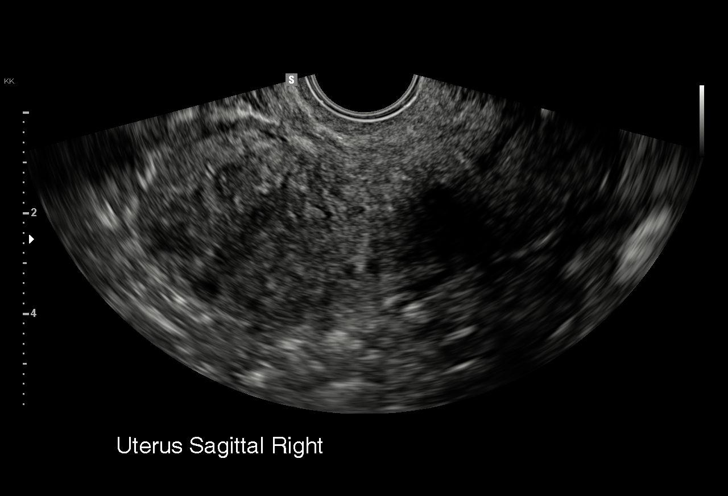
[im 28/66]
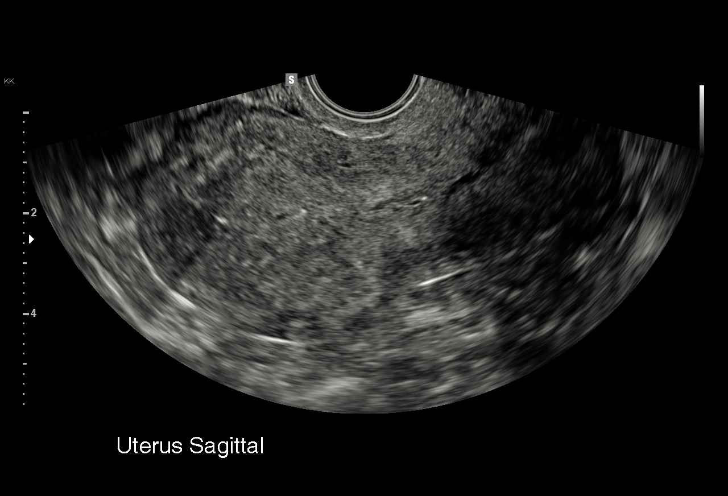
[im 33/66]
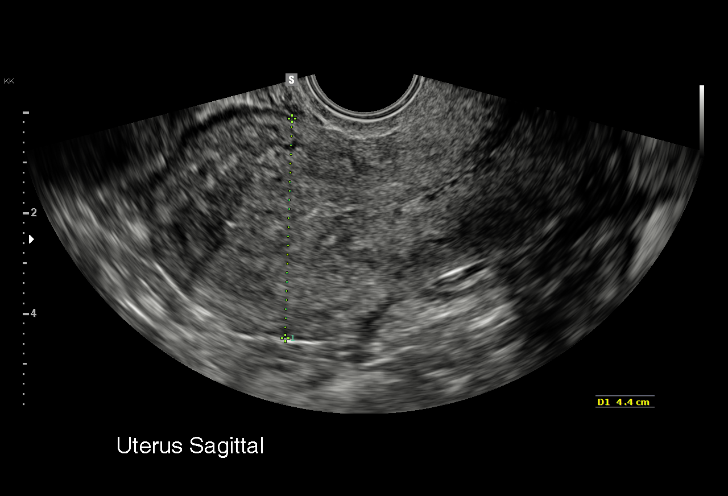
[im 38/66]
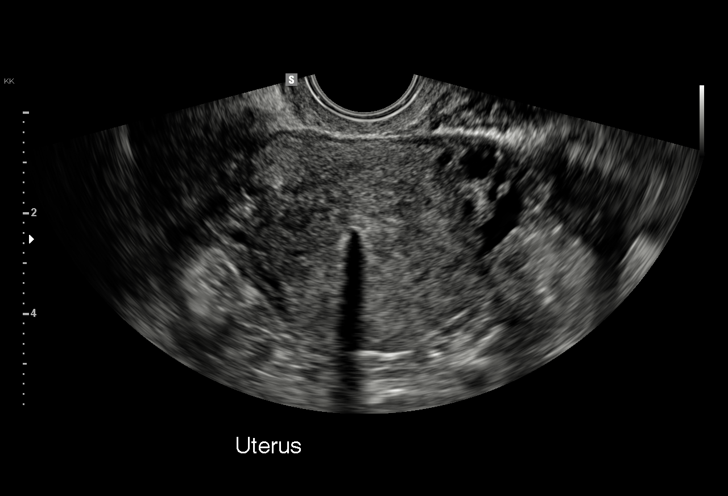
[im 41/66]
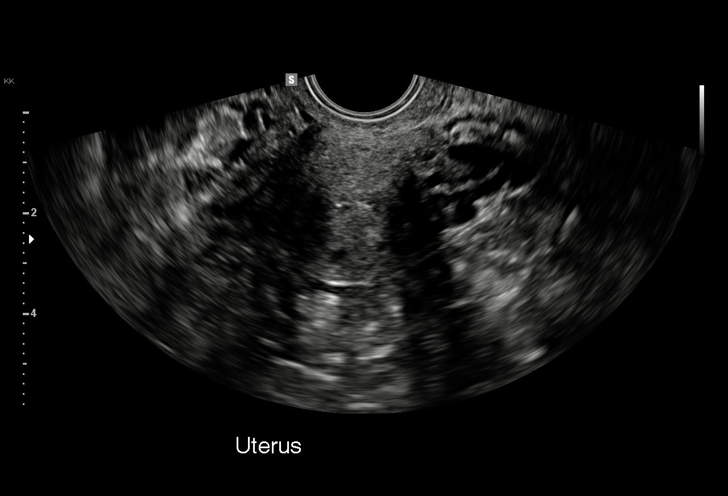
[im 47/66]
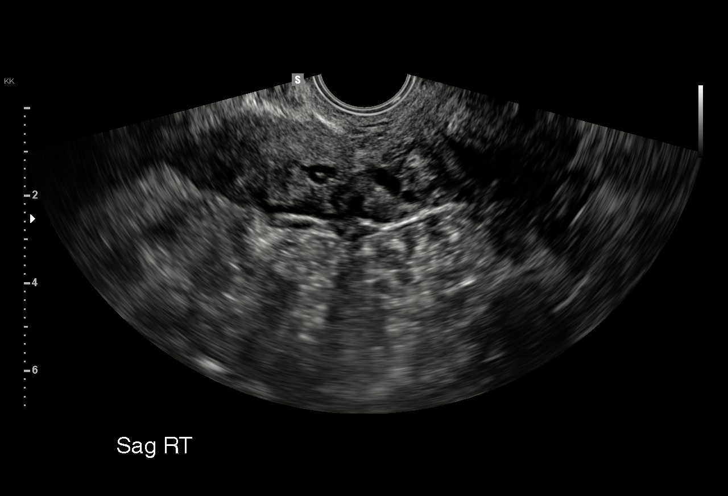
[im 52/66]
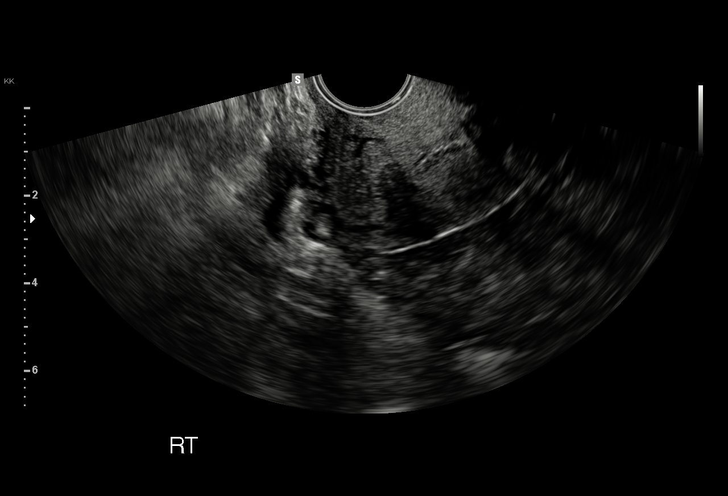
[im 55/66]
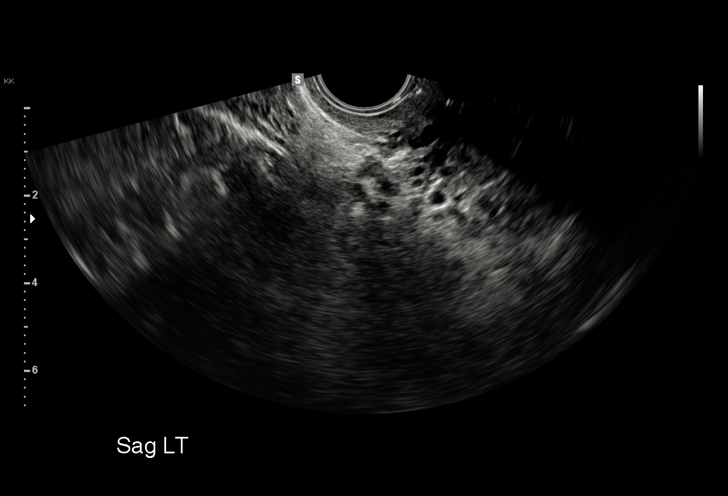
[im 60/66]
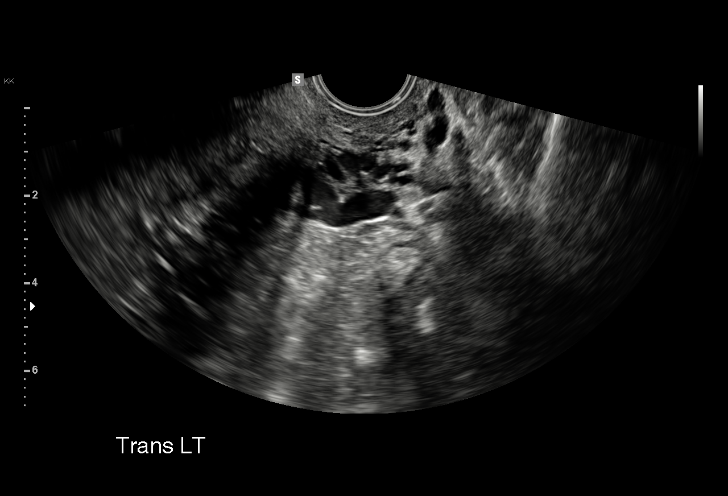
[im 66/66]
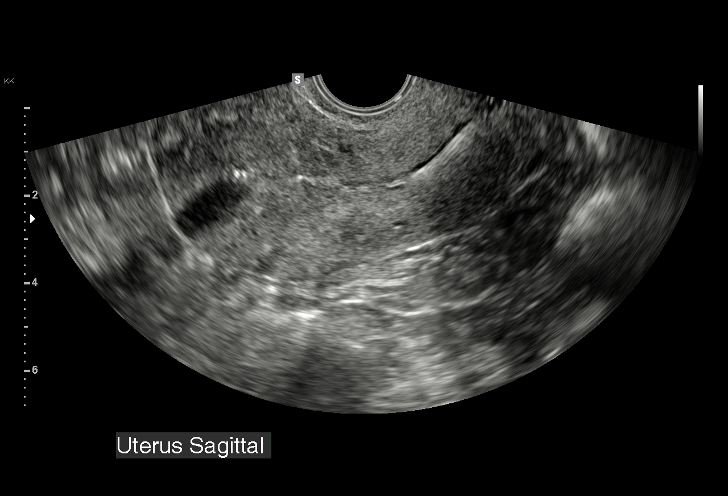

[15 of 25 positions shown; findings below may reference images not displayed]

FINDINGS: Uterus

Measurements: 8.9 x 4.4 x 4.8 cm. No fibroids or other mass
visualized.

Endometrium

The IUD is visualized within the endometrium. The endometrial stripe
does not appear abnormally thickened. A precise measurement was not
obtained but the endometrial stripe appears be clearly less than 5
mm in thickness. There are no abnormal endometrial fluid collections
observed..

Right ovary

Not visualized.

Left ovary

Not visualized.

Other findings

No abnormal free fluid.
IMPRESSION: The IUD appears appropriately positioned within the endometrium. The
uterus is otherwise normal.

The ovaries could not be visualized.  There is no free pelvic fluid.

## 2016-02-04 ENCOUNTER — Telehealth: Payer: Self-pay | Admitting: Family Medicine

## 2016-02-04 DIAGNOSIS — Z30432 Encounter for removal of intrauterine contraceptive device: Secondary | ICD-10-CM

## 2016-02-04 NOTE — Telephone Encounter (Signed)
Attempted to call patient about [place] results.  No answer.  IUD is in the uterus. Because we are unable to see the strings on external exam of the cervix, she will need to be referred to gynecology. I have put in this referral today. Please let patient know about the results.  [name], MD, MPH PGY-3,  [facility] 02/04/2016 2:02 PM

## 2016-02-05 NOTE — Telephone Encounter (Signed)
Patient informed, expressed understanding. 

## 2016-02-10 ENCOUNTER — Encounter: Payer: Self-pay | Admitting: Student in an Organized Health Care Education/Training Program

## 2016-02-10 ENCOUNTER — Ambulatory Visit (INDEPENDENT_AMBULATORY_CARE_PROVIDER_SITE_OTHER): Payer: 59 | Admitting: Student in an Organized Health Care Education/Training Program

## 2016-02-10 VITALS — BP 157/96 | HR 94 | Temp 98.7°F | Wt 297.2 lb

## 2016-02-10 DIAGNOSIS — M545 Low back pain, unspecified: Secondary | ICD-10-CM

## 2016-02-10 DIAGNOSIS — I10 Essential (primary) hypertension: Secondary | ICD-10-CM

## 2016-02-10 MED ORDER — IBUPROFEN 200 MG PO TABS: 400.0000 mg | ORAL_TABLET | Freq: Four times a day (QID) | ORAL | Status: DC | PRN

## 2016-02-10 MED ORDER — CYCLOBENZAPRINE HCL 5 MG PO TABS: 5.0000 mg | ORAL_TABLET | Freq: Three times a day (TID) | ORAL | Status: DC | PRN

## 2016-02-10 NOTE — Assessment & Plan Note (Signed)
-   Patient's pressures elevated today, no headaches or vision changes - patient admits to missing doses of Norvasc, takes about 5/7 pills per week - recommended follow up with PCP for blood pressure management in a separate visit

## 2016-02-10 NOTE — Assessment & Plan Note (Signed)
Most likely etiology is muscle spasm.  Symptoms are less suggestive of radiculopathy or spinal stenosis.  - flexeril, ibuprofen - patient informed that flexeril cannot be taken with the leftover tramadol she has at home due to inc risk of sedation/respiratory depression when these two medications are taken together - pt will follow up in one month if symptoms have not improved or resolved, consider physical therapy at that time.

## 2016-02-10 NOTE — Patient Instructions (Addendum)
It was a pleasure seeing you today in our clinic. Today we discussed your back pain, which is most likely due to muscle spasm. Here is the treatment plan we have discussed and agreed upon together:  - flexeril and ibuprofen for pain - follow up in 1 month if your symptoms have not improved or resolved - if you experience worsened urinary incontinence, or fecal incontinence, or numbness bilaterally in your legs go to the emergency room immediately. - follow up with PCP regarding blood pressure management  -as we discussed, do not take flexeril with tramadol at home, as these medications together are unsafe and can cause sedation and decrease the rate of your breathing.  Our clinic's number is [phone]. Feel free to call with any questions or concerns about what we discussed today.   - Dr. Burr Medico

## 2016-02-10 NOTE — Progress Notes (Signed)
    CC: back pain  HPI: Stacy Campos is a 45 y.o. female who presents to [facility] today with left lower back pain that is described as "pulling" discomfort for the last month.  The pain does not radiate, came on gradually and has worsened now that she moved to a 3rd floor apartment with no elevator.  Pain is palliated with bending forward and stretching out her back. She reports that sometimes she pretends to tie her shoe during the day at work as an excuse to stretch. The patient has difficulty sleeping due to pain and feels this has affected her ability to concentrate at work.  She does report that she took two leftover 50 mg tramadol last night to help with sleep and this relieved her pain.   Of note, patient has a history of scoliosis for which she was in a back brace from ages [age], and she endorses subsequent complcations including chronic urinary incontinence and episodic left lower leg numbness/weakness.  These symptoms have been stable with her current back pain episode.  Red Flags Fecal/urinary incontinence:stable urinary incontinence, no new symptoms Numbness/Weakness: no Fever/chills/sweats: no Night pain: yes  Unexplained weight loss: no No relief with bedrest: no  h/o cancer/immunosuppression: no  IV drug use: no  PMH of osteoporosis or chronic steroid use: no    Review of Symptoms: See HPI for ROS.    CC, SH/smoking status, and VS noted.  Objective: BP 157/96 mmHg  Pulse 94  Temp(Src) 98.7 F (37.1 C) (Oral)  Wt 297 lb 3.2 oz (134.809 kg)  SpO2 97% GEN: NAD, alert, cooperative, and pleasant EYE: no conjunctival injection, pupils equally round and reactive to light NECK: full ROM GI: soft, non-tender, non-distended, normoactive bowel sounds, no hepatosplenomegaly SKIN: warm and dry, no rashes or lesions NEURO: II-XII grossly intact, normal gait, peripheral sensation intact, +tightness latissimus dorsi PSYCH: AAOx3, appropriate affect  Assessment and plan:  HTN  (hypertension) - Patient's pressures elevated today, no headaches or vision changes - patient admits to missing doses of Norvasc, takes about 5/7 pills per week - recommended follow up with PCP for blood pressure management in a separate visit  Left-sided low back pain without sciatica Most likely etiology is muscle spasm.  Symptoms are less suggestive of radiculopathy or spinal stenosis.  - flexeril, ibuprofen - patient informed that flexeril cannot be taken with the leftover tramadol she has at home due to inc risk of sedation/respiratory depression when these two medications are taken together - pt will follow up in one month if symptoms have not improved or resolved, consider physical therapy at that time.    No orders of the defined types were placed in this encounter.    Meds ordered this encounter  Medications  . ibuprofen (ADVIL) 200 MG tablet    Sig: Take 2 tablets (400 mg total) by mouth every 6 (six) hours as needed for moderate pain.    Dispense:  30 tablet    Refill:  0  . cyclobenzaprine (FLEXERIL) 5 MG tablet    Sig: Take 1 tablet (5 mg total) by mouth 3 (three) times daily as needed for muscle spasms.    Dispense:  30 tablet    Refill:  1     Everrett Coombe, MD,MS,  PGY1 02/10/2016 9:22 PM

## 2016-02-14 ENCOUNTER — Emergency Department (HOSPITAL_COMMUNITY)
Admission: EM | Admit: 2016-02-14 | Discharge: 2016-02-14 | Disposition: A | Discharge status: Home / Self Care | Payer: 59 | Attending: Emergency Medicine | Admitting: Emergency Medicine

## 2016-02-14 ENCOUNTER — Emergency Department (HOSPITAL_COMMUNITY): Payer: 59

## 2016-02-14 ENCOUNTER — Encounter (HOSPITAL_COMMUNITY): Payer: Self-pay | Admitting: Emergency Medicine

## 2016-02-14 ENCOUNTER — Ambulatory Visit (HOSPITAL_COMMUNITY)
Admission: EM | Admit: 2016-02-14 | Discharge: 2016-02-14 | Disposition: A | Discharge status: Home / Self Care | Payer: 59 | Attending: Physician Assistant | Admitting: Physician Assistant

## 2016-02-14 ENCOUNTER — Encounter (HOSPITAL_COMMUNITY): Payer: Self-pay

## 2016-02-14 DIAGNOSIS — M545 Low back pain, unspecified: Secondary | ICD-10-CM

## 2016-02-14 DIAGNOSIS — Z79899 Other long term (current) drug therapy: Secondary | ICD-10-CM | POA: Insufficient documentation

## 2016-02-14 DIAGNOSIS — I10 Essential (primary) hypertension: Secondary | ICD-10-CM | POA: Diagnosis not present

## 2016-02-14 DIAGNOSIS — M5136 Other intervertebral disc degeneration, lumbar region: Secondary | ICD-10-CM | POA: Diagnosis not present

## 2016-02-14 HISTORY — DX: Scoliosis, unspecified: M41.9

## 2016-02-14 IMAGING — CR DG LUMBAR SPINE COMPLETE 4+V
5 series · 5 of 5 positions shown · non-contrast
Comparison: None.

CLINICAL DATA: Low back pain for the past 3 weeks. No known injury.

EXAM:
LUMBAR SPINE - COMPLETE 4+ VIEW

[l-spine ap]
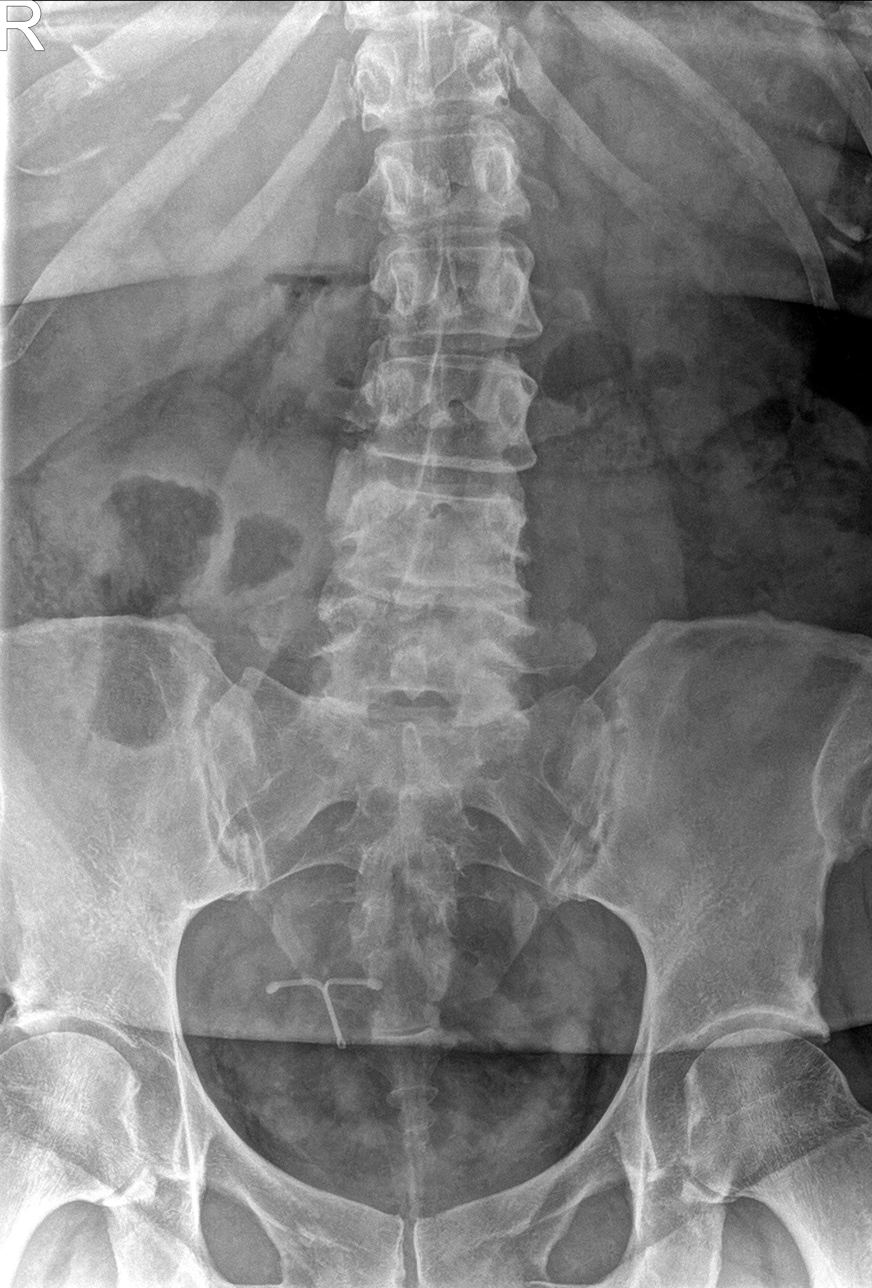

[l-spine obl (1 of 2)]
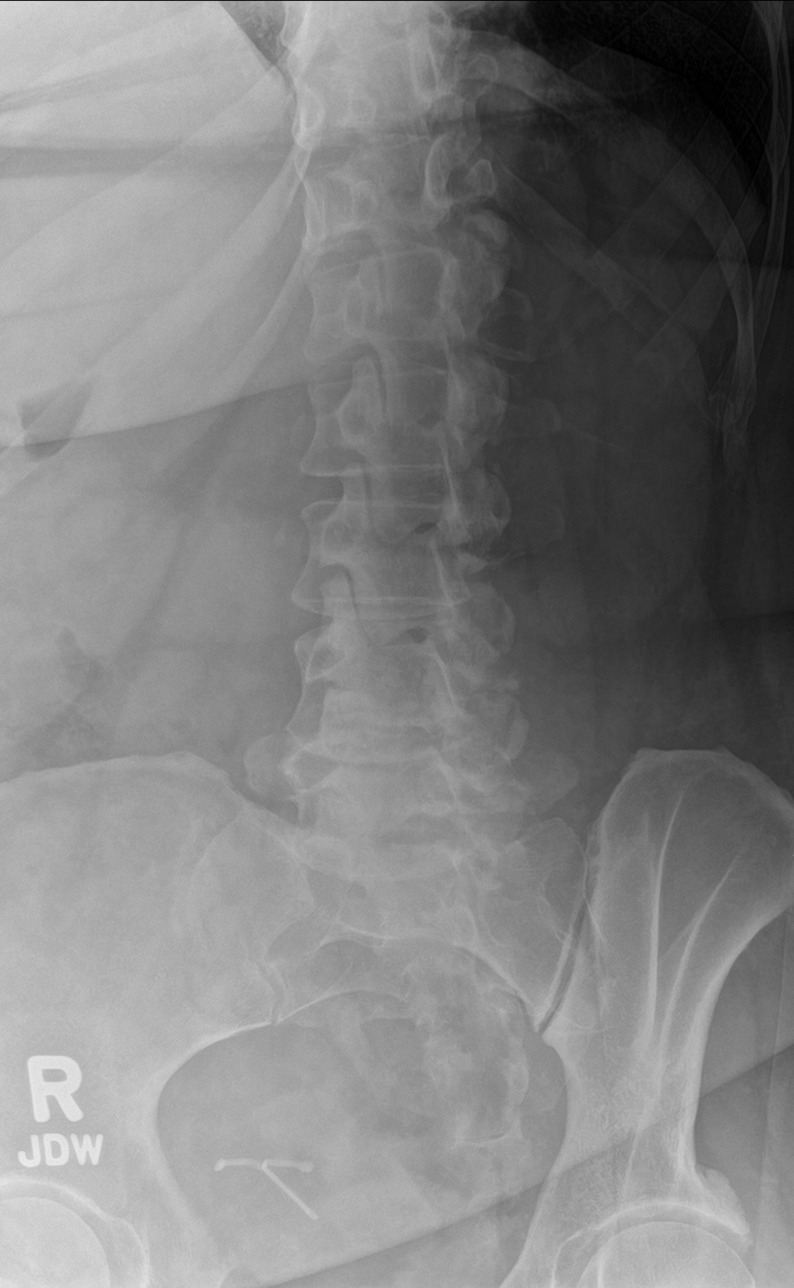

[l-spine obl (2 of 2)]
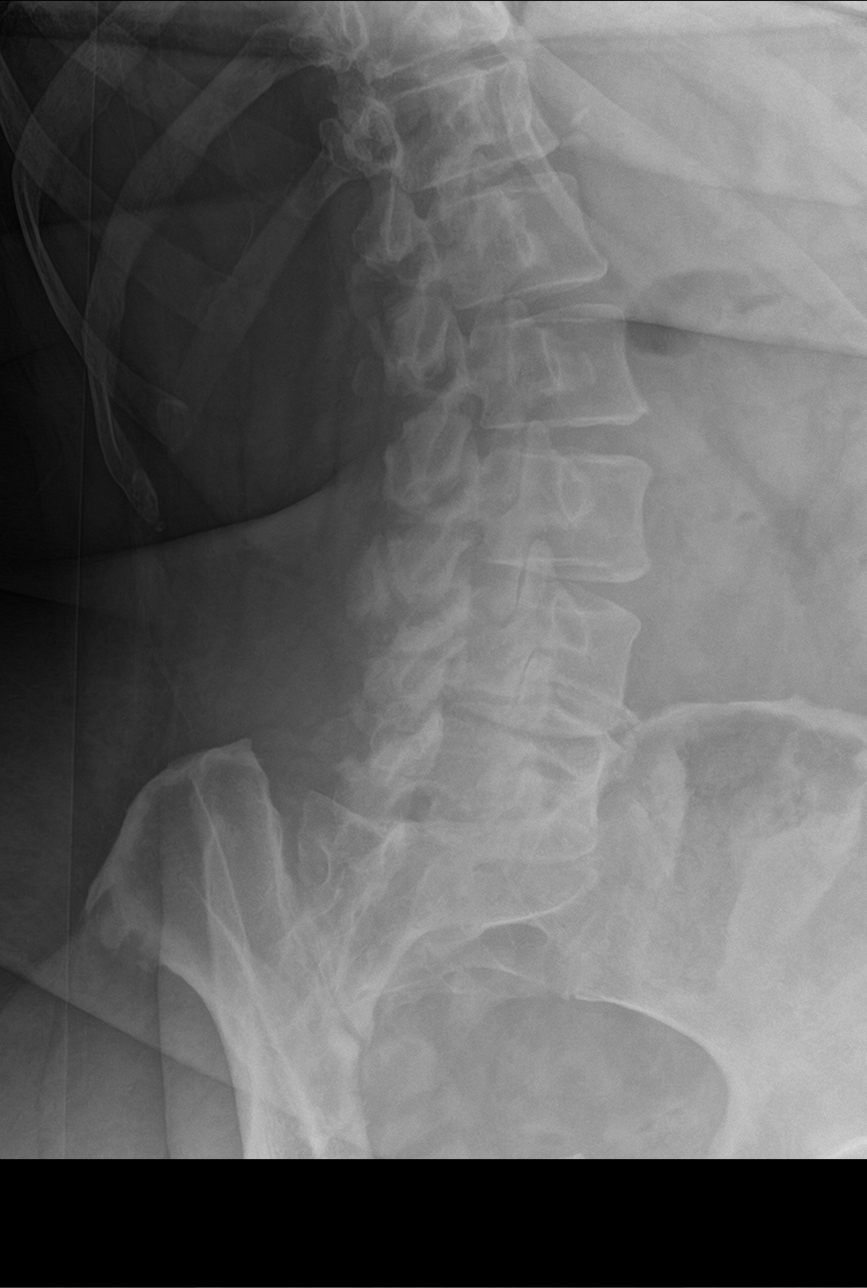

[l-spine lat]
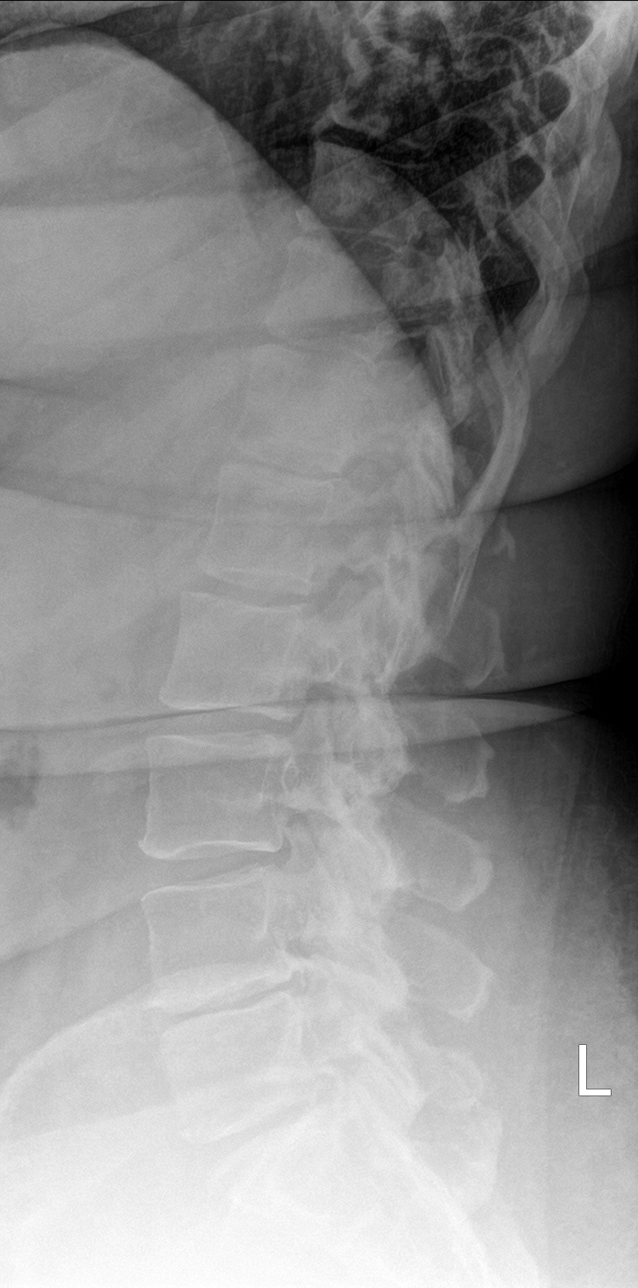

[l-spine spot]
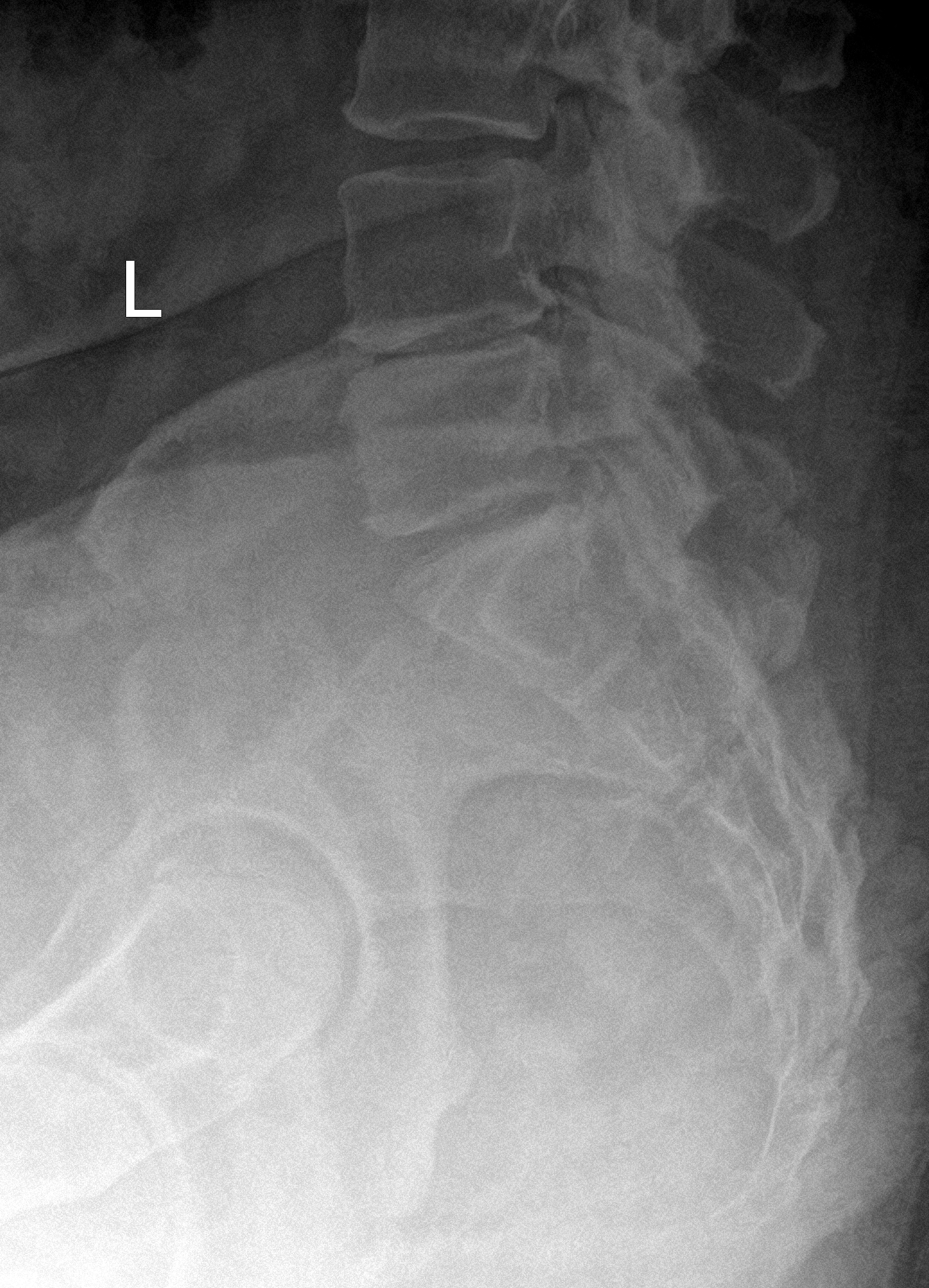

[5 of 5 positions shown; findings below may reference images not displayed]

FINDINGS: There are 5 non rib-bearing lumbar type vertebral bodies.

Mild scoliotic curvature of the thoracolumbar spine with dominant
caudal component convex the left measuring approximately 13 degrees
(as measured from the superior endplate of T12 to the inferior
endplate of L4). No anterolisthesis or retrolisthesis

Lumbar vertebral body heights are preserved.

Mild to moderate multilevel lumbar spine DDD, worse at L4-L5, and to
a lesser extent, L5-S1, with disc space height loss, endplate
irregularity and sclerosis.

Limited visualization the bilateral SI joints and hips is normal.

An intrauterine device overlies the lower pelvis.
IMPRESSION: 1. No acute findings.
2. Mild scoliotic curvature of the thoracolumbar spine with
associated mild to moderate multilevel lumbar spine DDD, likely
worse at L4-L5.

## 2016-02-14 MED ORDER — KETOROLAC TROMETHAMINE 60 MG/2ML IM SOLN
60.0000 mg | Freq: Once | INTRAMUSCULAR | Status: AC
  Administered 2016-02-14: 60 mg via INTRAMUSCULAR
  Filled 2016-02-14: qty 2

## 2016-02-14 MED ORDER — PREDNISONE 20 MG PO TABS: ORAL_TABLET | ORAL | 0 refills | Status: DC

## 2016-02-14 MED ORDER — HYDROCODONE-ACETAMINOPHEN 5-325 MG PO TABS: 2.0000 | ORAL_TABLET | Freq: Four times a day (QID) | ORAL | 0 refills | Status: DC | PRN

## 2016-02-14 MED ORDER — TRAMADOL HCL 50 MG PO TABS: 50.0000 mg | ORAL_TABLET | Freq: Four times a day (QID) | ORAL | 0 refills | Status: DC | PRN

## 2016-02-14 NOTE — ED Triage Notes (Addendum)
Patient here with 3 weeks of back pain radiating around to left side with movement. Has been evaluated by MD and again today at [facility] and here for further evaluation. Stats that the muscle relaxer makes her sleepy and aleve with no relief. Denies trauma

## 2016-02-14 NOTE — ED Provider Notes (Signed)
[facility] DEPT Provider Note   CSN: [id number] Arrival date & time: 02/14/16  1948  First Provider Contact:  None     By signing my name below, I, [facility], attest that this documentation has been prepared under the direction and in the presence of Domenic Moras, PA-C. Electronically Signed: Virgel Bouquet, ED Scribe. 02/14/16. 9:47 PM.   History   Chief Complaint No chief complaint on file.   HPI Stacy Campos is a 45 y.o. female with a hx of scoliosis who presents to the Emergency Department complaining of constant, moderate, gradually worsening lower back pain that radiates to her left abdomen onset 3 weeks ago. From medical records, pt has been seen multiples times for the same complaint in the past week including 3 days ago by her PCP where she was diagnosed with left-sided low back pain without sciatica and prescribed ibuprofen and cyclobenzaprine and at [facility] shortly PTA today where she was prescribed tramadol. Pt states that her symptoms began with mild pain in her lower back that was initially relieved with Tylenol but has since increased in severity. She reports associated sleep disturbance secondary to pain. Pain is worse with movement. She has taken Tylenol (initially relieved but ceased providing relief), Advil, tramadol (temporary relief), and muscle relaxants without relief. Per pt, she recently moved to a third floor apartment and has to walk up three flights of steps multiple times a day. She reports that she has a hx of scoliosis from when she was a child but has been asymptomatic in the past 10 years. Denies recent heavy lifting, increases in physical activity, or other injuries. Denies receiving imaging of her back recently. Denies having an orthopedist currently. Denies fever, lightheadedness, dizziness, dysuria, or bowel or bladder incontinence.  The history is provided by the patient. No language interpreter was used.    Past Medical History:  Diagnosis  Date  . Elevated blood pressure reading without diagnosis of hypertension   . Scoliosis     Patient Active Problem List   Diagnosis Date Noted  . Low back pain 02/10/2016  . Encounter for IUD removal 01/19/2016  . Hydradenitis 12/07/2015  . HTN (hypertension) 01/07/2010    Past Surgical History:  Procedure Laterality Date  . KNEE ARTHROSCOPY  1992   right knee  . KNEE SURGERY  1992   left knee    OB History    Gravida Para Term Preterm AB Living   5 3 1 2 2 3    SAB TAB Ectopic Multiple Live Births   2               Home Medications    Prior to Admission medications   Medication Sig Start Date End Date Taking? Authorizing Provider  acetaminophen (TYLENOL) 500 MG tablet Take 1,500 mg by mouth every 6 (six) hours as needed for mild pain.     Historical Provider, MD  amLODipine (NORVASC) 5 MG tablet Take 1 tablet (5 mg total) by mouth daily. 12/07/15   [name], MD  cholecalciferol (VITAMIN D) 1000 units tablet Take 1,000 Units by mouth daily.    Historical Provider, MD  clindamycin (CLINDAGEL) 1 % gel Apply topically 2 (two) times daily. 12/14/15   [place] N Rumley, DO  cyclobenzaprine (FLEXERIL) 5 MG tablet Take 1 tablet (5 mg total) by mouth 3 (three) times daily as needed for muscle spasms. 02/10/16   Everrett Coombe, MD  ibuprofen (ADVIL) 200 MG tablet Take 2 tablets (400 mg total) by mouth every  6 (six) hours as needed for moderate pain. 02/10/16   Everrett Coombe, MD  Multiple Vitamin (MULTIVITAMIN) capsule Take 1 capsule by mouth daily.    Historical Provider, MD  traMADol (ULTRAM) 50 MG tablet Take 1 tablet (50 mg total) by mouth every 6 (six) hours as needed. Patient taking differently: Take 50 mg by mouth every 6 (six) hours as needed for moderate pain.  10/23/14   Gregor Hams, MD  traMADol (ULTRAM) 50 MG tablet Take 1 tablet (50 mg total) by mouth every 8 (eight) hours as needed. 12/17/15   Veatrice Bourbon, MD  traMADol (ULTRAM) 50 MG tablet Take 1 tablet (50 mg  total) by mouth every 6 (six) hours as needed. 02/14/16   Konrad Felix, PA    Family History Family History  Problem Relation Age of Onset  . Arthritis Mother   . Rheum arthritis Mother   . Lung disease Mother     interstitial 2/2 RA, rheumatic heart disease  . Stroke Father   . Hypertension Father   . Diabetes Father   . Heart disease Father   . Hyperlipidemia Father   . Stroke Maternal Grandmother   . Diabetes Maternal Grandmother   . Heart disease Maternal Grandfather   . Diabetes Paternal Grandmother   . Diabetes Paternal Grandfather   . Heart disease Paternal Grandfather   . Lung cancer Sister     Social History Social History  Substance Use Topics  . Smoking status: Never Smoker  . Smokeless tobacco: Never Used  . Alcohol use 0.0 oz/week     Comment: 3 times per year     Allergies   Dimetapp [albertsons di bromm]   Review of Systems Review of Systems  Constitutional: Negative for fever.  Genitourinary: Negative for dysuria.  Musculoskeletal: Positive for back pain.  Neurological: Negative for dizziness and light-headedness.  Psychiatric/Behavioral: Positive for sleep disturbance.     Physical Exam Updated Vital Signs BP (!) 163/103 (BP Location: Left Arm)   Pulse 92   Temp 98.5 F (36.9 C) (Oral)   Resp 18   SpO2 95%   Physical Exam  Constitutional: She is oriented to person, place, and time. She appears well-developed and well-nourished. No distress.  Moderately obese AAF, in NAD, nontoxic in appearance.   HENT:  Head: Normocephalic and atraumatic.  Eyes: Conjunctivae are normal.  Neck: Normal range of motion.  Cardiovascular: Normal rate.   Pulmonary/Chest: Effort normal. No respiratory distress.  Musculoskeletal: Normal range of motion. She exhibits tenderness.  Tenderness extending from L-2 to S-1. No creptius or step-offs. Tenderness noted to left paraspinal muscles. No overlying skin changes.  Neurological: She is alert and oriented to  person, place, and time.  5/5 strength to BLE with decreased patellar tendon reflex bilaterally. No foot drops. DP pulses palpable.  Skin: Skin is warm and dry.  Psychiatric: She has a normal mood and affect. Her behavior is normal.  Nursing note and vitals reviewed.    ED Treatments / Results   DIAGNOSTIC STUDIES: Oxygen Saturation is 95% on RA, adequate by my interpretation.    COORDINATION OF CARE: 8:30 PM Will order L-spine x-ray and IM Toradol. Discussed treatment plan with pt at bedside and pt agreed to plan.  9:46 PM Returned to discuss results of back imaging with pt. Will discharge pt with a referral to an orthopedist. Advised pt to follow-up with orthopedist. No red flags.  Able to ambulate.    Radiology Dg Lumbar Spine Complete  Result Date: 02/14/2016 CLINICAL DATA:  Low back pain for the past 3 weeks. No known injury. EXAM: LUMBAR SPINE - COMPLETE 4+ VIEW COMPARISON:  None. FINDINGS: There are 5 non rib-bearing lumbar type vertebral bodies. Mild scoliotic curvature of the thoracolumbar spine with dominant caudal component convex the left measuring approximately 13 degrees (as measured from the superior endplate of 624THL to the inferior endplate of L4). No anterolisthesis or retrolisthesis Lumbar vertebral body heights are preserved. Mild to moderate multilevel lumbar spine DDD, worse at L4-L5, and to a lesser extent, L5-S1, with disc space height loss, endplate irregularity and sclerosis. Limited visualization the bilateral SI joints and hips is normal. An intrauterine device overlies the lower pelvis. IMPRESSION: 1. No acute findings. 2. Mild scoliotic curvature of the thoracolumbar spine with associated mild to moderate multilevel lumbar spine DDD, likely worse at L4-L5. Electronically Signed   By: Sandi Mariscal M.D.   On: 02/14/2016 21:40   Procedures Procedures  Medications Ordered in ED Medications  ketorolac (TORADOL) injection 60 mg (not administered)     Initial  Impression / Assessment and Plan / ED Course  I have reviewed the triage vital signs and the nursing notes.  Pertinent labs & imaging results that were available during my care of the patient were reviewed by me and considered in my medical decision making (see chart for details).  Clinical Course    BP 151/97 (BP Location: Left Arm)   Pulse 91   Temp 97.6 F (36.4 C) (Oral)   Resp 18   SpO2 95%    Final Clinical Impressions(s) / ED Diagnoses   Final diagnoses:  Midline low back pain without sciatica  DDD (degenerative disc disease), lumbar    New Prescriptions New Prescriptions   HYDROCODONE-ACETAMINOPHEN (NORCO/VICODIN) 5-325 MG TABLET    Take 2 tablets by mouth every 6 (six) hours as needed for moderate pain or severe pain.   PREDNISONE (DELTASONE) 20 MG TABLET    2 tabs po daily x 4 days   I personally performed the services described in this documentation, which was scribed in my presence. The recorded information has been reviewed and is accurate.     8:46 PM Pt with progressive worsening non radicular low back pain x 3 weeks.  Having difficulty walking 2/2 pain.  Has been seen several times both in the ER and at Urgent Care for same.  Pain not improved with NSAIDs and Muscle relaxant.  Pain is reproducible on exam.  No red flags.  Will obtain Lspine xray, toradol given.     Domenic Moras, PA-C 02/14/16 2152    Harvel Quale, MD 02/15/16 [id number]

## 2016-02-14 NOTE — ED Triage Notes (Signed)
The patient presented to the [facility] with a complaint of mid to lower back pain x 2 weeks. The patient denied any known injury. The patient stated that she has a hx of scoliosis.

## 2016-02-14 NOTE — Discharge Instructions (Signed)
You should continue your symptomatic care   Call your doctor for sooner follow up as you may need to be scheduled for MRI.

## 2016-02-14 NOTE — Discharge Instructions (Signed)
Please take steroid and pain medication as prescribed.  Follow up with orthopedist for further management of your back pain, which is due to degenerative disk disease and scoliosis.

## 2016-02-19 NOTE — ED Provider Notes (Signed)
CSN: [id number]     Arrival date & time 02/14/16  1948 History   First MD Initiated Contact with Patient 02/15/16 0012     No chief complaint on file.  (Consider location/radiation/quality/duration/timing/severity/associated sxs/prior Treatment) HPI 45 Y/O FEMALE WITH ONGOING BACK PAIN FOR THE LAST FEW WEEKS. HAS BEEN SEEN IN ER AND NOW HERE IN UC. NO KNOWN INJURY. RELATED TO ARTHRITIS. HAS NOT SEEN BACK SPECIALIST. NO CHANGE IN DAILY LIVING ACTIVITIES. NO BOWEL OR BLADDER DYSFUNCTION. PAIN SCORE IS 3 AND IS WORSE WITH MOVEMENT Past Medical History:  Diagnosis Date  . Elevated blood pressure reading without diagnosis of hypertension   . Scoliosis    Past Surgical History:  Procedure Laterality Date  . KNEE ARTHROSCOPY  1992   right knee  . KNEE SURGERY  1992   left knee   Family History  Problem Relation Age of Onset  . Arthritis Mother   . Rheum arthritis Mother   . Lung disease Mother     interstitial 2/2 RA, rheumatic heart disease  . Stroke Father   . Hypertension Father   . Diabetes Father   . Heart disease Father   . Hyperlipidemia Father   . Stroke Maternal Grandmother   . Diabetes Maternal Grandmother   . Heart disease Maternal Grandfather   . Diabetes Paternal Grandmother   . Diabetes Paternal Grandfather   . Heart disease Paternal Grandfather   . Lung cancer Sister    Social History  Substance Use Topics  . Smoking status: Never Smoker  . Smokeless tobacco: Never Used  . Alcohol use 0.0 oz/week     Comment: 3 times per year   OB History    Gravida Para Term Preterm AB Living   5 3 1 2 2 3    SAB TAB Ectopic Multiple Live Births   2             Review of Systems  Denies: HEADACHE, NAUSEA, ABDOMINAL PAIN, CHEST PAIN, CONGESTION, DYSURIA, SHORTNESS OF BREATH  Allergies  Dimetapp [albertsons di bromm]  Home Medications   Prior to Admission medications   Medication Sig Start Date End Date Taking? Authorizing Provider  acetaminophen (TYLENOL) 500 MG  tablet Take 1,500 mg by mouth every 6 (six) hours as needed for mild pain.     Historical Provider, MD  amLODipine (NORVASC) 5 MG tablet Take 1 tablet (5 mg total) by mouth daily. 12/07/15   [name], MD  cholecalciferol (VITAMIN D) 1000 units tablet Take 1,000 Units by mouth daily.    Historical Provider, MD  clindamycin (CLINDAGEL) 1 % gel Apply topically 2 (two) times daily. 12/14/15   [place] N Rumley, DO  cyclobenzaprine (FLEXERIL) 5 MG tablet Take 1 tablet (5 mg total) by mouth 3 (three) times daily as needed for muscle spasms. 02/10/16   Everrett Coombe, MD  HYDROcodone-acetaminophen (NORCO/VICODIN) 5-325 MG tablet Take 2 tablets by mouth every 6 (six) hours as needed for moderate pain or severe pain. 02/14/16   Domenic Moras, PA-C  ibuprofen (ADVIL) 200 MG tablet Take 2 tablets (400 mg total) by mouth every 6 (six) hours as needed for moderate pain. 02/10/16   Everrett Coombe, MD  Multiple Vitamin (MULTIVITAMIN) capsule Take 1 capsule by mouth daily.    Historical Provider, MD  predniSONE (DELTASONE) 20 MG tablet 2 tabs po daily x 4 days 02/14/16   Domenic Moras, PA-C   Meds Ordered and Administered this Visit   Medications  ketorolac (TORADOL) injection 60 mg (60 mg  Intramuscular Given 02/14/16 2045)    BP 151/97 (BP Location: Left Arm)   Pulse 91   Temp 97.6 F (36.4 C) (Oral)   Resp 18   SpO2 95%  No data found.   Physical Exam NURSES NOTES AND VITAL SIGNS REVIEWED. CONSTITUTIONAL: Well developed, well nourished, no acute distress HEENT: normocephalic, atraumatic EYES: Conjunctiva normal NECK:normal ROM, supple, no adenopathy PULMONARY:No respiratory distress, normal effort ABDOMINAL: Soft, ND, NT BS+, No CVAT MUSCULOSKELETAL: Normal ROM of all extremities, LUMBAR SPINE, NO SPASM, OR MIDLINE TENDERNESS. THERE IS SOME DISCOMFORT TO PALPATION. SLR NEG. SKIN: warm and dry without rash PSYCHIATRIC: Mood and affect, behavior are normal  Urgent Care Course   Clinical Course     Procedures (including critical care time)  Labs Review Labs Reviewed - No data to display  Imaging Review No results found.   Visual Acuity Review  Right Eye Distance:   Left Eye Distance:   Bilateral Distance:    Right Eye Near:   Left Eye Near:    Bilateral Near:       PT ASKS FOR PREDNISONE. STATES THAT HELPS THE MOST.  MDM   1. Midline low back pain without sciatica   2. DDD (degenerative disc disease), lumbar     Patient is reassured that there are no issues that require transfer to higher level of care at this time or additional tests. Patient is advised to continue home symptomatic treatment. Patient is advised that if there are new or worsening symptoms to attend the emergency department, contact primary care provider, or return to UC. Instructions of care provided discharged home in stable condition.    THIS NOTE WAS GENERATED USING A VOICE RECOGNITION SOFTWARE PROGRAM. ALL REASONABLE EFFORTS  WERE MADE TO PROOFREAD THIS DOCUMENT FOR ACCURACY.  I have verbally reviewed the discharge instructions with the patient. A printed AVS was given to the patient.  All questions were answered prior to discharge.      Konrad Felix, [place] 02/19/16 1121

## 2016-03-01 ENCOUNTER — Encounter: Payer: Self-pay | Admitting: Gynecology

## 2016-03-01 ENCOUNTER — Ambulatory Visit (INDEPENDENT_AMBULATORY_CARE_PROVIDER_SITE_OTHER): Payer: 59 | Admitting: Gynecology

## 2016-03-01 ENCOUNTER — Ambulatory Visit: Payer: 59 | Admitting: Family Medicine

## 2016-03-01 VITALS — BP 146/90 | Ht 67.0 in | Wt 299.0 lb

## 2016-03-01 DIAGNOSIS — Z30432 Encounter for removal of intrauterine contraceptive device: Secondary | ICD-10-CM | POA: Diagnosis not present

## 2016-03-01 DIAGNOSIS — I10 Essential (primary) hypertension: Secondary | ICD-10-CM

## 2016-03-01 NOTE — Progress Notes (Signed)
   Patient is a 45 year old gravida 5 para 3 Ab1 who was referred to our practice from the [facility] to have her Mirena IUD removed. Patient stated was placed in 2015 but January this year her husband had a vasectomy. She had a complete exam this year and her Pap smear is up-to-date with the exception of her mammogram. She was otherwise asymptomatic.  Exam: Blood pressure 146/90  weight 299 pounds Abdomen: Soft nontender no rebound or guarding Pelvic: Bartholin urethra Skene was within normal limits Vagina: No lesions or discharge 1 cervix: IUD string was visualized  The cervix was cleansed with Betadine solution. A [place] clamp was used to grasp the IUD string which was retrieved shown to the patient and discarded.  Bimanual exam and rectal exam was not done.  Assessment/plan: 1. patient here to remove Mirena IUD which was removed and discarded husband had a vasectomy January this year.. 2. Patient with history of essential hypertension did not take her Norvasc today she was instructed to do so soon as she got home. 3. Patient needs annual exam next year.

## 2016-03-02 ENCOUNTER — Ambulatory Visit: Payer: 59 | Attending: Orthopaedic Surgery | Admitting: Physical Therapy

## 2016-03-02 DIAGNOSIS — M6281 Muscle weakness (generalized): Secondary | ICD-10-CM

## 2016-03-02 DIAGNOSIS — M545 Low back pain, unspecified: Secondary | ICD-10-CM

## 2016-03-02 DIAGNOSIS — R262 Difficulty in walking, not elsewhere classified: Secondary | ICD-10-CM | POA: Diagnosis present

## 2016-03-02 NOTE — Patient Instructions (Signed)
Access Code: [email or web address]  URL: [email or web address]  Date: 03/02/2016  Prepared by: Selinda Eon   Exercises  Seated Pelvic Tilt  Standing Lumbar Spine Flexion Stretch Counter - 1 reps - 1 sets - 10 hold - 3x daily - 7x weekly

## 2016-03-02 NOTE — Therapy (Addendum)
[place], [place], [address] Phone: [phone]   Fax:  [phone]  Physical Therapy Evaluation/Discharge Summary  Patient Details  Name: Stacy Campos MRN: [id number] Date of Birth: Oct 24, 1970 Referring Provider: Jean Rosenthal MD  Encounter Date: 03/02/2016      PT End of Session - 03/02/16 1742    Visit Number 1   Number of Visits 9   Date for PT Re-Evaluation 04/01/16   Authorization Type [employer]   PT Start Time 1632   PT Stop Time 1730   PT Time Calculation (min) 58 min   Activity Tolerance Patient limited by pain;Patient tolerated treatment well   Behavior During Therapy [facility], [place] for tasks assessed/performed      Past Medical History:  Diagnosis Date  . Elevated blood pressure reading without diagnosis of hypertension   . Hypertension   . Scoliosis     Past Surgical History:  Procedure Laterality Date  . KNEE ARTHROSCOPY  1992   right knee  . KNEE SURGERY  1992   left knee    There were no vitals filed for this visit.       Subjective Assessment - 03/02/16 1636    Subjective pt reports having back pain for years. In the last 6 weeks pain has progressively increased. Recently moved and is now living on 3rd floor. Occasionally has to sit and rest to decrease pain. Feels like something is pulling from spine to abdomen. Wears R shoes out before L; wore brace as child for scoliosis.    How long can you sit comfortably? 1 hour   How long can you stand comfortably? 10 min   How long can you walk comfortably? <10 min   Diagnostic tests scoliotic curve convex L   Patient Stated Goals sleep, bend forward to tie shoes, wash back using R upper extremity, be able to walk 1 mile at lease without taking a rest break   Currently in Pain? Yes   Pain Score 7   10/10 at worst in last week   Pain Location Back   Pain Orientation Lower;Left   Pain Descriptors / Indicators --  pulling, grabbing   Pain  Type Chronic pain   Aggravating Factors  stairs, "doing too much", worsens as week progresses   Pain Relieving Factors rest, medications (takes minimally due to feelings on them)            [facility] PT Assessment - 03/02/16 0001      Assessment   Medical Diagnosis LBP, DDD   Referring Provider Jean Rosenthal MD   Hand Dominance Right;Left   Next MD Visit 8/28   Prior Therapy no     Precautions   Precautions None     Restrictions   Weight Bearing Restrictions No     Balance Screen   Has the patient fallen in the past 6 months No     [place] residence   Type of [place] to enter   Entrance Stairs-Number of Steps 3rd floor     Prior Function   Level of Independence Independent   Vocation Full time employment   Vocation Requirements seated most of the day     Cognition   Overall Cognitive Status Within Functional Limits for tasks assessed     Posture/Postural Control   Posture Comments notable lumbar scoliotic curve to R; resting R sidebend in trunk; L UE held further away from body  ROM / Strength   AROM / PROM / Strength Strength     Strength   Overall Strength Comments unable to further test at eval due to pain   Strength Assessment Site Hip;Knee   Right/Left Knee Left   Left Knee Flexion 3+/5  pain   Left Knee Extension 4/5     Palpation   Palpation comment TTP bilateral SIJ (L>R)                   [facility] Adult PT Treatment/Exercise - 03/02/16 0001      Exercises   Exercises Lumbar     Lumbar Exercises: Stretches   Prone Mid Back Stretch Limitations standing with pull off from sink     Lumbar Exercises: Seated   Other Seated Lumbar Exercises seated pelvic tilts; LLE on 2" step     Modalities   Modalities Moist Heat     Moist Heat Therapy   Number Minutes Moist Heat 10 Minutes   Moist Heat Location Lumbar Spine     Manual Therapy   Manual therapy comments  educated on use of tennis ball and theracane                PT Education - 03/02/16 1724    Education provided Yes   Education Details anatomy of condition, POC, HEP, QL tension with curvature   Person(s) Educated Patient   Methods Explanation;Demonstration;Tactile cues;Verbal cues;Handout   Comprehension Verbalized understanding;Returned demonstration;Verbal cues required;Tactile cues required;Need further instruction          PT Short Term Goals - 03/02/16 1752      PT SHORT TERM GOAL #1   Title Pt will verbalize ability to utilize core musculature while seated at work to decrease LBP by 8/25   Baseline began educating at eval   Time 2   Period Weeks   Status New     PT SHORT TERM GOAL #2   Title Pt wil verbalize independence with HEP as it has been established   Baseline began at eval   Time 2   Period Weeks   Status New           PT Long Term Goals - 03/02/16 1753      PT LONG TERM GOAL #1   Title Pt will be able to walk at least 1 mile without taking a rest break in order to improve community ambulation ability by 9/22   Baseline unable at eval   Time 6   Period Weeks   Status New     PT LONG TERM GOAL #2   Title Pt will be able to bend forward to tie her shoes with minimal low back discomfort   Baseline unable at eval   Time 6   Period Weeks   Status New     PT LONG TERM GOAL #3   Title Pt will verbalize average pain <=5/10   Baseline 7/10 at rest at eval   Time 6   Period Weeks   Status New     PT LONG TERM GOAL #4   Title Pt will be able to climb stairs to her apartment independently and without rest break   Baseline has help from fiance and takes rest breaks.   Time 6   Period Weeks   Status New               Plan - 03/02/16 1743    Clinical Impression Statement Pt presents to PT with complaints of  LBP that is mosly to her Left side. Pt has lumbar curve convex to the R. Notable tightness and concordant TTP in L QL. Pt was  educated on improtance of stretching by lengthening the spine rather than bending and twisting due to anatomical changes in spinal segments. Pt will benefit from skilled PT in order to improve abdominal recruitment to provide support to spine and decrease overuse of low back muscualture.    Rehab Potential Fair   Clinical Impairments Affecting Rehab Potential Chronic LBP and scoliosis   PT Frequency 2x / week   PT Duration 4 weeks   PT Treatment/Interventions ADLs/Self Care Home Management;Electrical Stimulation;Iontophoresis 75m/ml Dexamethasone;Stair training;Gait training;Traction;Moist Heat;Therapeutic activities;Ultrasound;Therapeutic exercise;Balance training;Neuromuscular re-education;Patient/family education;Passive range of motion;Manual techniques;Dry needling;Taping   PT Next Visit Plan stretch via lengthening, abdominal recruitment, decrease L QL tightness; FOTO   PT Home Exercise Plan post pelvic tilt in chair, stretch spine via holding on to sink, seated with L foot on elevated surface   Consulted and Agree with Plan of Care Patient      Patient will benefit from skilled therapeutic intervention in order to improve the following deficits and impairments:  Pain, Improper body mechanics, Postural dysfunction, Decreased strength, Decreased activity tolerance, Increased muscle spasms, Difficulty walking  Visit Diagnosis: Left-sided low back pain without sciatica - Plan: PT plan of care cert/re-cert  Difficulty in walking, not elsewhere classified - Plan: PT plan of care cert/re-cert  Muscle weakness (generalized) - Plan: PT plan of care cert/re-cert     Problem List Patient Active Problem List   Diagnosis Date Noted  . Low back pain 02/10/2016  . Encounter for IUD removal 01/19/2016  . Hydradenitis 12/07/2015  . HTN (hypertension) 01/07/2010    Tiffaney Heimann C. Franceen Erisman PT, DPT 03/02/16 6:00 PM   [facility]GS[address] [place] [address]Phone: [phone]  Fax:  [phone] Name: TTzivia OneilMRN: [id number] of Birth: [birth year] PHYSICAL THERAPY DISCHARGE SUMMARY  Visits from Start of Care: 1  Current functional level related to goals / functional outcomes: See above   Remaining deficits: See above   Education / Equipment: Anatomy of condition, POC, HEP, exercise form/rationale  Plan: Patient agrees to discharge.  Patient goals were not met. Patient is being discharged due to not returning since the last visit.  ?????   Romain Erion C. Vika Buske PT, DPT 04/26/16 1:02 PM

## 2016-03-08 ENCOUNTER — Ambulatory Visit (INDEPENDENT_AMBULATORY_CARE_PROVIDER_SITE_OTHER): Payer: 59 | Admitting: Family Medicine

## 2016-03-08 ENCOUNTER — Encounter: Payer: Self-pay | Admitting: Family Medicine

## 2016-03-08 DIAGNOSIS — L732 Hidradenitis suppurativa: Secondary | ICD-10-CM

## 2016-03-08 DIAGNOSIS — I10 Essential (primary) hypertension: Secondary | ICD-10-CM | POA: Diagnosis not present

## 2016-03-08 MED ORDER — HYDROCHLOROTHIAZIDE 12.5 MG PO TABS: 12.5000 mg | ORAL_TABLET | Freq: Every day | ORAL | 3 refills | Status: DC

## 2016-03-08 MED ORDER — DOXYCYCLINE HYCLATE 100 MG PO TABS
100.0000 mg | ORAL_TABLET | Freq: Two times a day (BID) | ORAL | 0 refills | Status: DC
Start: 2016-03-08 — End: 2016-05-17

## 2016-03-08 NOTE — Progress Notes (Signed)
Subjective: ID:[id number] refill HPI: Patient is a 45 y.o. female presenting to clinic today for medication refill.  Hypertension Blood pressure at home: 140/80s  Meds: amlodipine, hasn't taken medication today.  Side effects: patient notes significant swelling in her LEs bilaterally. She notes b/c of this. She has to urinate more frequently during the night as her legs are elevated.  ROS: Denies headache, dizziness, visual changes, nausea, vomiting, chest pain, abdominal pain or shortness of breath.  "Boil" Started on Thursday under the L axilla. Boil has gotten longer in the last 3 days. The area is very "hard" and uncomfortable. Simply putting a shirt on bothers the area. She's noted some warmth about the area but no fevers or chills. No myalgias. She notes with her previous abscess, she got significant relief with I&D and antibiotics. She's tried warm compresses without relief. No drainage.   Social History: never smoker   ROS: All other systems reviewed and are negative.  Past Medical History Patient Active Problem List   Diagnosis Date Noted  . Low back pain 02/10/2016  . Encounter for IUD removal 01/19/2016  . Hydradenitis 12/07/2015  . HTN (hypertension) 01/07/2010    Medications- reviewed and updated Current Outpatient Prescriptions  Medication Sig Dispense Refill  . acetaminophen (TYLENOL) 500 MG tablet Take 1,500 mg by mouth every 6 (six) hours as needed for mild pain.     . cholecalciferol (VITAMIN D) 1000 units tablet Take 1,000 Units by mouth daily.    . clindamycin (CLINDAGEL) 1 % gel Apply topically 2 (two) times daily. (Patient not taking: Reported on 03/01/2016) 30 g 1  . cyclobenzaprine (FLEXERIL) 5 MG tablet Take 1 tablet (5 mg total) by mouth 3 (three) times daily as needed for muscle spasms. (Patient not taking: Reported on 03/01/2016) 30 tablet 1  . doxycycline (VIBRA-TABS) 100 MG tablet Take 1 tablet (100 mg total) by mouth 2 (two) times daily. 20 tablet 0   . hydrochlorothiazide (HYDRODIURIL) 12.5 MG tablet Take 1 tablet (12.5 mg total) by mouth daily. 90 tablet 3  . HYDROcodone-acetaminophen (NORCO/VICODIN) 5-325 MG tablet Take 2 tablets by mouth every 6 (six) hours as needed for moderate pain or severe pain. (Patient not taking: Reported on 03/01/2016) 8 tablet 0  . ibuprofen (ADVIL) 200 MG tablet Take 2 tablets (400 mg total) by mouth every 6 (six) hours as needed for moderate pain. (Patient not taking: Reported on 03/01/2016) 30 tablet 0  . Multiple Vitamin (MULTIVITAMIN) capsule Take 1 capsule by mouth daily.    . predniSONE (DELTASONE) 20 MG tablet 2 tabs po daily x 4 days 8 tablet 0   No current facility-administered medications for this visit.     Objective: Office vital signs reviewed. BP (!) 163/94   Pulse 98   Temp 98.2 F (36.8 C) (Oral)   Ht 5\' 7"  (1.702 m)   Wt 300 lb (136.1 kg)   BMI 46.99 kg/m    Physical Examination:  General: Awake, alert, well- nourished, NAD. Some pain with pulling her shirt up and down for the examination.  Cardio: RRR, no m/r/g noted.  Pulm: No increased WOB.  CTAB, without wheezes, rhonchi or crackles noted.  Ext: LE swollen, but only trace pitting edema.  Skin:  4.5x0.5cm erythematous indurated area under the left axilla with 1cm region of fluctuance. No drainage noted. Mild warmth to palpation. Small flesh colored area of induration just superior to that measuring 1x1cm without fluctuance.    Written consent obtained. Risks and benefits of  the procedure were explained to the patient. Patient made an informed decision to proceed with the procedure. Betadine prep per usual protocol. Local anesthesia obtained with 2% lidocaine with epi 2 cc.  25mmm incision made with 11 blade along abscess Mild purulence expressed. Lesion explored with hemostats revealing no loculations. Packed with 10 cm plain packing.  Hemostasis achieved.  Wound was dressed. Wound care instructions including return  precautions with patient. Patient tolerated the procedure well. Recheck in 48 hours.  Assessment/Plan: HTN (hypertension) BP not at goal. No symptoms of elevated BP. Pt seems significantly bothered by her LE swelling. I suspect this is secondary to her CCB. - will d/c amlodipine - start HCTZ- advised this may cause increased urination (pt has urinary incontinence at baseline). - pt to f/u in a few days for a BP check.   Hydradenitis Worsening hydradenitis with abscess. Abscess I&D'd in office today ( see procedure note). It seems as though the pt has done better with doxycycline than Keflex in the past. - doxycycline 100 BID x10 days - follow up in 48hrs for wound check - given the small size, I do not think she'll need repeat packing - ED precautions discussed   No orders of the defined types were placed in this encounter.   Meds ordered this encounter  Medications  . hydrochlorothiazide (HYDRODIURIL) 12.5 MG tablet    Sig: Take 1 tablet (12.5 mg total) by mouth daily.    Dispense:  90 tablet    Refill:  3  . doxycycline (VIBRA-TABS) 100 MG tablet    Sig: Take 1 tablet (100 mg total) by mouth 2 (two) times daily.    Dispense:  20 tablet    Refill:  [place] PGY-3, [place]

## 2016-03-08 NOTE — Patient Instructions (Signed)
Start taking hydrochlorothiazide  Start taking doxycyline. Follow up in clinic in 3 days for a wound check and BP check.  Incision and Drainage, Care After Refer to this sheet in the next few weeks. These instructions provide you with information on caring for yourself after your procedure. Your caregiver may also give you more specific instructions. Your treatment has been planned according to current medical practices, but problems sometimes occur. Call your caregiver if you have any problems or questions after your procedure. HOME CARE INSTRUCTIONS   If antibiotic medicine is given, take it as directed. Finish it even if you start to feel better.  Only take over-the-counter or prescription medicines for pain, discomfort, or fever as directed by your caregiver.  Keep all follow-up appointments as directed by your caregiver.  Change any bandages (dressings) as directed by your caregiver. Replace old dressings with clean dressings.  Wash your hands before and after caring for your wound. You will receive specific instructions for cleansing and caring for your wound.  SEEK MEDICAL CARE IF:   You have increased pain, swelling, or redness around the wound.  You have increased drainage, smell, or bleeding from the wound.  You have muscle aches, chills, or you feel generally sick.  You have a fever. MAKE SURE YOU:   Understand these instructions.  Will watch your condition.  Will get help right away if you are not doing well or get worse.   This information is not intended to replace advice given to you by your health care provider. Make sure you discuss any questions you have with your health care provider.   Document Released: 10/03/2011 Document Revised: 08/01/2014 Document Reviewed: 10/03/2011 Elsevier Interactive Patient Education [employer].

## 2016-03-08 NOTE — Progress Notes (Deleted)
    Subjective: CC:*** HPI: Patient is a 45 y.o. female with a past medical history of *** presenting to clinic today for ***.    Social History: ***  Health Maintenance: ***  ROS: All other systems reviewed and are negative.  Past Medical History Patient Active Problem List   Diagnosis Date Noted  . Low back pain 02/10/2016  . Encounter for IUD removal 01/19/2016  . Hydradenitis 12/07/2015  . HTN (hypertension) 01/07/2010    Medications- reviewed and updated Current Outpatient Prescriptions  Medication Sig Dispense Refill  . acetaminophen (TYLENOL) 500 MG tablet Take 1,500 mg by mouth every 6 (six) hours as needed for mild pain.     Marland Kitchen amLODipine (NORVASC) 5 MG tablet Take 1 tablet (5 mg total) by mouth daily. (Patient not taking: Reported on 03/01/2016) 30 tablet 1  . cholecalciferol (VITAMIN D) 1000 units tablet Take 1,000 Units by mouth daily.    . clindamycin (CLINDAGEL) 1 % gel Apply topically 2 (two) times daily. (Patient not taking: Reported on 03/01/2016) 30 g 1  . cyclobenzaprine (FLEXERIL) 5 MG tablet Take 1 tablet (5 mg total) by mouth 3 (three) times daily as needed for muscle spasms. (Patient not taking: Reported on 03/01/2016) 30 tablet 1  . HYDROcodone-acetaminophen (NORCO/VICODIN) 5-325 MG tablet Take 2 tablets by mouth every 6 (six) hours as needed for moderate pain or severe pain. (Patient not taking: Reported on 03/01/2016) 8 tablet 0  . ibuprofen (ADVIL) 200 MG tablet Take 2 tablets (400 mg total) by mouth every 6 (six) hours as needed for moderate pain. (Patient not taking: Reported on 03/01/2016) 30 tablet 0  . Multiple Vitamin (MULTIVITAMIN) capsule Take 1 capsule by mouth daily.    . predniSONE (DELTASONE) 20 MG tablet 2 tabs po daily x 4 days 8 tablet 0   No current facility-administered medications for this visit.     Objective: Office vital signs reviewed. BP (!) 163/94   Pulse 98   Temp 98.2 F (36.8 C) (Oral)   Wt 300 lb (136.1 kg)   BMI 46.99 kg/m      Physical Examination:  General: Awake, alert, *** nourished, NAD ENMT:  TMs intact, normal light reflex, no erythema, no bulging. Nasal turbinates moist. MMM, Oropharynx clear without erythema or tonsillar exudate/hypertrophy Eyes: Conjunctiva non-injected. PERRL.  Cardio: RRR, no m/r/g noted.  Pulm: No increased WOB.  CTAB, without wheezes, rhonchi or crackles noted.  GI: soft, NT/ND,+BS x4, no hepatomegaly, no splenomegaly GU: deffered Extremities: WWP, No edema, cyanosis or clubbing; +*** pulses bilaterally MSK: Normal gait and station Skin: dry, intact, no rashes or lesions Neuro: Strength and sensation grossly intact, DTRs ***/4  Assessment/Plan: No problem-specific Assessment & Plan notes found for this encounter.   No orders of the defined types were placed in this encounter.   No orders of the defined types were placed in this encounter.   Archie Patten PGY-2, [place]

## 2016-03-09 ENCOUNTER — Ambulatory Visit: Payer: 59 | Admitting: Physical Therapy

## 2016-03-09 NOTE — Assessment & Plan Note (Addendum)
Worsening hydradenitis with abscess. Abscess I&D'd in office today ( see procedure note). It seems as though the pt has done better with doxycycline than Keflex in the past. - doxycycline 100 BID x10 days - follow up in 48hrs for wound check - given the small size, I do not think she'll need repeat packing - ED precautions discussed

## 2016-03-09 NOTE — Assessment & Plan Note (Addendum)
BP not at goal. No symptoms of elevated BP. Pt seems significantly bothered by her LE swelling. I suspect this is secondary to her CCB. - will d/c amlodipine - start HCTZ- advised this may cause increased urination (pt has urinary incontinence at baseline). - pt to f/u in a few days for a BP check.

## 2016-03-17 ENCOUNTER — Ambulatory Visit: Payer: 59 | Admitting: Physical Therapy

## 2016-03-21 ENCOUNTER — Ambulatory Visit: Payer: 59 | Admitting: Physical Therapy

## 2016-03-23 ENCOUNTER — Ambulatory Visit: Payer: 59 | Admitting: Physical Therapy

## 2016-05-17 ENCOUNTER — Encounter: Payer: Self-pay | Admitting: Family Medicine

## 2016-05-17 ENCOUNTER — Ambulatory Visit (INDEPENDENT_AMBULATORY_CARE_PROVIDER_SITE_OTHER): Payer: 59 | Admitting: Family Medicine

## 2016-05-17 VITALS — BP 167/105 | HR 94 | Temp 98.4°F | Ht 67.0 in | Wt 296.6 lb

## 2016-05-17 DIAGNOSIS — G5601 Carpal tunnel syndrome, right upper limb: Secondary | ICD-10-CM

## 2016-05-17 DIAGNOSIS — I10 Essential (primary) hypertension: Secondary | ICD-10-CM

## 2016-05-17 DIAGNOSIS — G5600 Carpal tunnel syndrome, unspecified upper limb: Secondary | ICD-10-CM | POA: Insufficient documentation

## 2016-05-17 DIAGNOSIS — L732 Hidradenitis suppurativa: Secondary | ICD-10-CM

## 2016-05-17 HISTORY — DX: Carpal tunnel syndrome, unspecified upper limb: G56.00

## 2016-05-17 MED ORDER — CHLORHEXIDINE GLUCONATE 4 % EX LIQD: Freq: Every day | CUTANEOUS | 2 refills | Status: DC | PRN

## 2016-05-17 MED ORDER — HYDROCHLOROTHIAZIDE 25 MG PO TABS: 25.0000 mg | ORAL_TABLET | Freq: Every day | ORAL | 3 refills | Status: DC

## 2016-05-17 MED ORDER — DOXYCYCLINE HYCLATE 100 MG PO TABS: 100.0000 mg | ORAL_TABLET | Freq: Two times a day (BID) | ORAL | 0 refills | Status: DC

## 2016-05-17 NOTE — Assessment & Plan Note (Signed)
Poorly controlled Not consistently taking antihypertensives Increase HCTZ to 25 mg daily and encourage daily use Follow-up in 2-4 weeks

## 2016-05-17 NOTE — Assessment & Plan Note (Signed)
Exam consistent with carpal tunnel syndrome of right wrist Encouraged ergonomic desk positioners Referral to orthopedics - will consider hand splinting prior to surgical options

## 2016-05-17 NOTE — Assessment & Plan Note (Signed)
Hidradenitis flare without drainable abscess today Doxycycline 100 mg twice a day 10 days Chlorhexidine wash daily Return precautions discussed

## 2016-05-17 NOTE — Patient Instructions (Signed)
Nice to see you again today. Take the doxycycline antibiotic twice daily for the next 10 days for hidradenitis. Use the chlorhexidine soap in the areas of your hidradenitis daily to help keep the bacteria out.  Your hand numbness is related to carpal tunnel. I refer you to orthopedics. If you do not hear about this referral within the next 2 weeks please call our office and ask about it.  Increasing her HCTZ for your blood pressure to 25 mg daily. I'm sending a prescription for this. I like see back in 2-4 weeks to see how your blood pressure is doing.  Take care, Dr. B   Carpal Tunnel Syndrome Carpal tunnel syndrome is a condition that causes pain in your hand and arm. The carpal tunnel is a narrow area located on the palm side of your wrist. Repeated wrist motion or certain diseases may cause swelling within the tunnel. This swelling pinches the main nerve in the wrist (median nerve). CAUSES  This condition may be caused by:   Repeated wrist motions.  Wrist injuries.  Arthritis.  A cyst or tumor in the carpal tunnel.  Fluid buildup during pregnancy. Sometimes the cause of this condition is not known.  RISK FACTORS This condition is more likely to develop in:   People who have jobs that cause them to repeatedly move their wrists in the same motion, such as butchers and cashiers.  Women.  People with certain conditions, such as:  Diabetes.  Obesity.  An underactive thyroid (hypothyroidism).  Kidney failure. SYMPTOMS  Symptoms of this condition include:   A tingling feeling in your fingers, especially in your thumb, index, and middle fingers.  Tingling or numbness in your hand.  An aching feeling in your entire arm, especially when your wrist and elbow are bent for long periods of time.  Wrist pain that goes up your arm to your shoulder.  Pain that goes down into your palm or fingers.  A weak feeling in your hands. You may have trouble grabbing and holding  items. Your symptoms may feel worse during the night.  DIAGNOSIS  This condition is diagnosed with a medical history and physical exam. You may also have tests, including:   An electromyogram (EMG). This test measures electrical signals sent by your nerves into the muscles.  X-rays. TREATMENT  Treatment for this condition includes:  Lifestyle changes. It is important to stop doing or modify the activity that caused your condition.  Physical or occupational therapy.  Medicines for pain and inflammation. This may include medicine that is injected into your wrist.  A wrist splint.  Surgery. HOME CARE INSTRUCTIONS  If You Have a Splint:  Wear it as told by your health care provider. Remove it only as told by your health care provider.  Loosen the splint if your fingers become numb and tingle, or if they turn cold and blue.  Keep the splint clean and dry. General Instructions  Take over-the-counter and prescription medicines only as told by your health care provider.  Rest your wrist from any activity that may be causing your pain. If your condition is work related, talk to your employer about changes that can be made, such as getting a wrist pad to use while typing.  If directed, apply ice to the painful area:  Put ice in a plastic bag.  Place a towel between your skin and the bag.  Leave the ice on for 20 minutes, 2-3 times per day.  Keep all follow-up visits  as told by your health care provider. This is important.  Do any exercises as told by your health care provider, physical therapist, or occupational therapist. [place] IF:   You have new symptoms.  Your pain is not controlled with medicines.  Your symptoms get worse.   This information is not intended to replace advice given to you by your health care provider. Make sure you discuss any questions you have with your health care provider.   Document Released: 07/08/2000 Document Revised: 04/01/2015  Document Reviewed: 11/26/2014 Elsevier Interactive Patient Education [employer].

## 2016-05-17 NOTE — Progress Notes (Signed)
   Subjective:   Stacy Campos is a 45 y.o. female with a history of HTN, hydradenitis here for boils  Hidradenitis - Seen 03/08/16 for hidradenitis flare, I&D performed and treated with doxycycline - Patient reports worsening over last several weeks - affecting job performance - smell is bad - R axilla boils opened and will not close - using medical grade cleaning wipes - was using clinda gel before they opened up - using naproxen or robaxin prn for pain  r hand tingling - occurs while typing - does a lot of typing for job - feels very cold - occurring over last few weeks  HTN: - Medications: switched from amlodipine to HCTZ 12.5mg  daily on 8/17 - Compliance: not taking daily - does not want to take with robaxin  - uses ~4 days/wk, did take this AM - Checking BP at home: no - Denies any SOB, CP, vision changes, LE edema, medication SEs, or symptoms of hypotension - Diet: working with Dr Jenne Campus  Review of Systems:  Per HPI.   Social History: never smoker  Objective:  BP (!) 167/105   Pulse 94   Temp 98.4 F (36.9 C) (Oral)   Ht 5\' 7"  (1.702 m)   Wt 296 lb 9.6 oz (134.5 kg)   BMI 46.45 kg/m   Gen:  45 y.o. female in NAD HEENT: NCAT, MMM, EOMI, PERRL, anicteric sclerae CV: RRR, no MRG Resp: Non-labored, CTAB, no wheezes noted Ext: WWP, 1+ edema MSK: R wrist: Full ROM, no deformities, erythema, swelling, no TTP, +Tinels and Phalens Skin: R axilla with multiple draining areas, no abscesses currently, redness diffusely, very TTP, hurley stage 2 Neuro: Alert and oriented, speech normal      Chemistry      Component Value Date/Time   NA 139 12/07/2015 1203   K 4.0 12/07/2015 1203   CL 102 12/07/2015 1203   CO2 25 12/07/2015 1203   BUN 9 12/07/2015 1203   CREATININE 0.72 12/07/2015 1203      Component Value Date/Time   CALCIUM 8.9 12/07/2015 1203   ALKPHOS 58 12/07/2015 1203   AST 16 12/07/2015 1203   ALT 17 12/07/2015 1203   BILITOT 0.4 12/07/2015 1203       Lab Results  Component Value Date   WBC 7.6 01/07/2010   HGB 13.0 01/07/2010   HCT 40.5 01/07/2010   MCV 84.4 01/07/2010   PLT 271 01/07/2010   Lab Results  Component Value Date   TSH 1.752 01/07/2010   Lab Results  Component Value Date   HGBA1C 5.8 12/07/2015   Assessment & Plan:     Stacy Campos is a 45 y.o. female here for   HTN (hypertension) Poorly controlled Not consistently taking antihypertensives Increase HCTZ to 25 mg daily and encourage daily use Follow-up in 2-4 weeks  Carpal tunnel syndrome Exam consistent with carpal tunnel syndrome of right wrist Encouraged ergonomic desk positioners Referral to orthopedics - will consider hand splinting prior to surgical options  Hydradenitis Hidradenitis flare without drainable abscess today Doxycycline 100 mg twice a day 10 days Chlorhexidine wash daily Return precautions discussed      [name], MD MPH PGY-3,  [facility] 05/17/2016  9:47 AM

## 2016-05-23 ENCOUNTER — Telehealth: Payer: Self-pay | Admitting: Family Medicine

## 2016-05-24 ENCOUNTER — Encounter: Payer: Self-pay | Admitting: Family Medicine

## 2016-05-24 ENCOUNTER — Ambulatory Visit (INDEPENDENT_AMBULATORY_CARE_PROVIDER_SITE_OTHER): Payer: 59 | Admitting: Family Medicine

## 2016-05-24 VITALS — BP 178/108 | HR 110 | Temp 98.3°F | Ht 67.0 in | Wt 294.0 lb

## 2016-05-24 DIAGNOSIS — I10 Essential (primary) hypertension: Secondary | ICD-10-CM

## 2016-05-24 DIAGNOSIS — N912 Amenorrhea, unspecified: Secondary | ICD-10-CM | POA: Diagnosis not present

## 2016-05-24 LAB — TSH: TSH: 1.53 m[IU]/L

## 2016-05-24 LAB — POCT URINE PREGNANCY: PREG TEST UR: NEGATIVE

## 2016-05-24 NOTE — Progress Notes (Signed)
   Subjective:   Stacy Campos is a 45 y.o. female with a history of HTN, hidradenitis here for same day appt for amenorrhea  Amenorrhea  - Reports that her partner had a vasectomy several years ago but never went to follow-up to evaluate sperm count - LMP 03/25/16 - Previously periods were regular  after IUD removal in 01/2016 - Having nightmares like she did when pregnant previously - Had one positive and one negative pregnancy test at home  HTN - Reports that BP is elevated because she is "mad" - has started exercising  Review of Systems:  Per HPI.   Social History: never smoker  Objective:  BP (!) 178/108   Pulse (!) 110   Temp 98.3 F (36.8 C) (Oral)   Ht 5\' 7"  (1.702 m)   Wt 294 lb (133.4 kg)   BMI 46.05 kg/m   Gen:  45 y.o. female in NAD HEENT: NCAT, MMM, EOMI, PERRL, anicteric sclerae CV: RRR, no MRG Resp: Non-labored, CTAB, no wheezes noted Abd: Soft, NTND, BS present, no guarding or organomegaly Ext: WWP, no edema MSK: No obvious deformity, gait intact Neuro: Alert and oriented, speech normal    Assessment & Plan:     Stacy Campos is a 45 y.o. female here for   Amenorrhea UPT negative Check TSH Could be related to per-menopause or recent IUD removal  HTN (hypertension) BP elevated today but patient is under a lot of stress Continue HCTZ F/u as scheduled    [name], MD MPH PGY-3,  [facility] 05/24/2016  10:43 AM

## 2016-05-24 NOTE — Assessment & Plan Note (Signed)
UPT negative Check TSH Could be related to per-menopause or recent IUD removal

## 2016-05-24 NOTE — Assessment & Plan Note (Signed)
BP elevated today but patient is under a lot of stress Continue HCTZ F/u as scheduled

## 2016-05-24 NOTE — Patient Instructions (Addendum)
You are NOT pregnant! This could be irregular periods related to pre-menopause or IUD removal.  We will check your thyroid function to make sure that is not causing this.  Someone will call you or send you a letter with the results when they're available.  Follow-up with me as scheduled for your blood pressure.  Take care, Dr. Jacinto Reap

## 2016-05-25 ENCOUNTER — Encounter: Payer: Self-pay | Admitting: Family Medicine

## 2016-05-26 ENCOUNTER — Telehealth: Payer: Self-pay | Admitting: Family Medicine

## 2016-05-26 NOTE — Telephone Encounter (Signed)
Pt was given a new BP medication and it is causing headaches and diarrhea and pt has been unable to go to work. Pt would like Dr. B to give her a call. Pt is dropping off FMLA paperwork today. Pt uses [facility] on [address]. Please advise. Thanks! ep

## 2016-05-26 NOTE — Telephone Encounter (Signed)
FMLA form dropped off for at front desk for completion.  Verified that patient section of form has been completed.  Last DOS with PCP was 05-24-2016.  Placed form in team folder to be completed by clinical staff.  Rosa A Charlies Constable

## 2016-05-26 NOTE — Telephone Encounter (Signed)
Form placed in PCP box 

## 2016-05-27 ENCOUNTER — Ambulatory Visit (INDEPENDENT_AMBULATORY_CARE_PROVIDER_SITE_OTHER): Payer: 59 | Admitting: Internal Medicine

## 2016-05-27 VITALS — BP 156/96 | HR 104 | Temp 98.3°F | Wt 291.2 lb

## 2016-05-27 DIAGNOSIS — I10 Essential (primary) hypertension: Secondary | ICD-10-CM | POA: Diagnosis not present

## 2016-05-27 DIAGNOSIS — Z23 Encounter for immunization: Secondary | ICD-10-CM | POA: Diagnosis not present

## 2016-05-27 MED ORDER — LISINOPRIL 20 MG PO TABS: 20.0000 mg | ORAL_TABLET | Freq: Every day | ORAL | 2 refills | Status: DC

## 2016-05-27 MED ORDER — LISINOPRIL 10 MG PO TABS: 10.0000 mg | ORAL_TABLET | Freq: Every day | ORAL | 3 refills | Status: DC

## 2016-05-27 NOTE — Telephone Encounter (Signed)
Pt has appt today @ 2:15. Evamaria Detore, Salome Spotted, CMA

## 2016-05-27 NOTE — Progress Notes (Signed)
   [facility] Kerrin Mo, MD Phone: [phone]  Reason For Visit:   CHRONIC HTN: Reports reports headache and diarrhea since the 24th when Dr. B increased patient HCTZ to 25 mg from 12.5 mg  Current Meds - HCTZ 25 mg  Reports good compliance, took meds today. Tolerating well, w/o complaints. Lifestyle -  Continues to try to lose weight  Denies CP, dyspnea, edema, dizziness / lightheadedness Endorses having diarrhea and headaches, though none currently as well as intermittent blurry vision   Patient states that she has not been to work due to blurry vision, diarrhea and mild headaches which she was started on HCTZ 25 mg on October 24th    Past Medical History Reviewed problem list.  Medications- reviewed and updated No additions to family history Social history- patient is a non smoker  Objective: BP (!) 156/96 (BP Location: Left Wrist, Patient Position: Sitting, Cuff Size: Normal)   Pulse (!) 104   Temp 98.3 F (36.8 C) (Oral)   Wt 291 lb 3.2 oz (132.1 kg)   SpO2 99%   BMI 45.61 kg/m  Gen: NAD, alert, cooperative with exam HEENT: Normal    Eyes: PERRLA Cardio: regular rate and rhythm, S1S2 heard, no murmurs appreciated Pulm: clear to auscultation bilaterally, no wheezes, rhonchi or rales  Assessment/Plan: See problem based a/p  HTN (hypertension) Side effects associated with current blood pressure medication. Including headaches, diarrhea and blurry vision. Indicate however that she has been compliant with this medication despite side effects.  - has an opthalmology appointment this afternoon, encouraged patient to follow up with them  - D/C  HCTZ, patient does not want to be on Norvasc has tried this in past. Will start lisinopril 20 mg  - Follow up in 1 week with Dr. B (appointment on the 15th) for blood pressure and will need to check a BMET at that time - Provide return precautions for worsening headaches, elevated blood pressure.

## 2016-05-27 NOTE — Progress Notes (Deleted)
   [facility] Skiler Olden, MD Phone: [phone]  Reason For Visit:   # *** -   Past Medical History Reviewed problem list.  Medications- reviewed and updated No additions to family history Social history- patient is a *** smoker  Objective: There were no vitals taken for this visit. Gen: NAD, alert, cooperative with exam HEENT: Normal    Neck: No masses palpated. No lymphadenopathy    Ears: Tympanic membranes intact, normal light reflex, no erythema, no bulging    Eyes: PERRLA, EOMI    Nose: nasal turbinates moist    Throat: moist mucus membranes, no erythema Cardio: regular rate and rhythm, S1S2 heard, no murmurs appreciated Pulm: clear to auscultation bilaterally, no wheezes, rhonchi or rales GI: soft, non-tender, non-distended, bowel sounds present, no hepatomegaly, no splenomegaly GU: external vaginal tissue ***, cervix ***, *** punctate lesions on cervix appreciated, *** discharge from cervical os, *** bleeding, *** cervical motion tenderness, *** abdominal/ adnexal masses Extremities: warm, well perfused, No edema, cyanosis or clubbing;  MSK: Normal gait and station Skin: dry, intact, no rashes or lesions Neuro: Strength and sensation grossly intact   Assessment/Plan: See problem based a/p  

## 2016-05-27 NOTE — Patient Instructions (Addendum)
I will stop your hydrochlorothiazide and start you on lisinopril 20 mg. I want you to follow up with your primary care physician on the 15th and at that time you will need to get blood work.

## 2016-05-28 NOTE — Assessment & Plan Note (Signed)
Side effects associated with current blood pressure medication. Including headaches, diarrhea and blurry vision. Indicate however that she has been compliant with this medication despite side effects.  - has an opthalmology appointment this afternoon, encouraged patient to follow up with them  - D/C  HCTZ, patient does not want to be on Norvasc has tried this in past. Will start lisinopril 20 mg  - Follow up in 1 week with Dr. B (appointment on the 15th) for blood pressure and will need to check a BMET at that time - Provide return precautions for worsening headaches, elevated blood pressure.

## 2016-05-30 NOTE — Telephone Encounter (Signed)
Form completed and put in Tamika's office.  Need copy scanned into chart.  Needs office stamp.  Please inform patient.  [name], MD, MPH PGY-3,  [facility] 05/30/2016 1:54 PM

## 2016-05-31 NOTE — Telephone Encounter (Signed)
Left voice message for patient that FMLA forms are complete and ready for pickup.  Forms copied for scanning in chart.  Original copy placed up front.  Derl Barrow, RN

## 2016-06-06 ENCOUNTER — Ambulatory Visit (INDEPENDENT_AMBULATORY_CARE_PROVIDER_SITE_OTHER): Payer: 59 | Admitting: Orthopaedic Surgery

## 2016-06-08 ENCOUNTER — Encounter: Payer: Self-pay | Admitting: Family Medicine

## 2016-06-08 ENCOUNTER — Ambulatory Visit (INDEPENDENT_AMBULATORY_CARE_PROVIDER_SITE_OTHER): Payer: 59 | Admitting: Family Medicine

## 2016-06-08 VITALS — BP 150/98 | HR 96 | Temp 97.9°F | Ht 67.0 in | Wt 297.0 lb

## 2016-06-08 DIAGNOSIS — I10 Essential (primary) hypertension: Secondary | ICD-10-CM

## 2016-06-08 DIAGNOSIS — H538 Other visual disturbances: Secondary | ICD-10-CM | POA: Diagnosis not present

## 2016-06-08 LAB — BASIC METABOLIC PANEL WITH GFR
BUN: 7 mg/dL (ref 7–25)
CO2: 25 mmol/L (ref 20–31)
CREATININE: 0.7 mg/dL (ref 0.50–1.10)
Calcium: 8.7 mg/dL (ref 8.6–10.2)
Chloride: 102 mmol/L (ref 98–110)
GFR, Est Non African American: >89 mL/min (ref >=60)
Glucose, Bld: 88 mg/dL (ref 65–99)
POTASSIUM: 3.7 mmol/L (ref 3.5–5.3)
SODIUM: 136 mmol/L (ref 135–146)

## 2016-06-08 MED ORDER — LISINOPRIL 40 MG PO TABS: 40.0000 mg | ORAL_TABLET | Freq: Every day | ORAL | 2 refills | Status: DC

## 2016-06-08 NOTE — Patient Instructions (Addendum)
Nice to see you again today. Your blood pressure is elevated (goal is <140/90).  We will increase your lisinopril to 40 mg daily. Please call to let us know what the other medication you're taking is.  We are getting some labs today and someone will call you or send you a letter with the results when they're available  I am referring you to ophthalmology for your blurry vision. You can try over-the-counter artificial tears for dry eyes in the meantime to see if this helps.  Take care, Dr. Jacinto Reap

## 2016-06-08 NOTE — Progress Notes (Signed)
   Subjective:   Stacy Campos is a 45 y.o. female with a history of HTN, hidradenitis, carpal tunnel syndrome here for HTN f/u  HTN: - Medications: lisinopril 20mg  daily and another medication that is 10mg  daily (unsure if this is amlodipine) - was seen 11/3 and HCTZ d/c'd due to side effects, Lisinopril started - Compliance: good - Checking BP at home: no - Denies any SOB, CP, LE edema, medication SEs, or symptoms of hypotension - urinating more now - Diet: avoiding extra salt, has been eating cake a lot recently - Exercise: walks appt around building daily  Blurry vision - present for ~1 month - no previously as much of a problem - doesn't know if night driving is difficult - worse in AM - eyes are red often - has a lot of hard drainage in corners of eyes   Review of Systems:  Per HPI.   Social History: Never smoker  Objective:  BP (!) 150/98   Pulse 96   Temp 97.9 F (36.6 C) (Oral)   Ht 5\' 7"  (1.702 m)   Wt 297 lb (134.7 kg)   BMI 46.52 kg/m   Gen:  45 y.o. female in NAD HEENT: NCAT, MMM, EOMI, PERRL, anicteric sclerae, no conjunctivitis CV: RRR, no MRG Resp: Non-labored, CTAB, no wheezes noted Abd: Soft, NTND, BS present, no guarding or organomegaly Ext: WWP, no edema MSK: No obvious deformities, gait intact Neuro: Alert and oriented, speech normal       Chemistry      Component Value Date/Time   NA 139 12/07/2015 1203   K 4.0 12/07/2015 1203   CL 102 12/07/2015 1203   CO2 25 12/07/2015 1203   BUN 9 12/07/2015 1203   CREATININE 0.72 12/07/2015 1203      Component Value Date/Time   CALCIUM 8.9 12/07/2015 1203   ALKPHOS 58 12/07/2015 1203   AST 16 12/07/2015 1203   ALT 17 12/07/2015 1203   BILITOT 0.4 12/07/2015 1203      Lab Results  Component Value Date   WBC 7.6 01/07/2010   HGB 13.0 01/07/2010   HCT 40.5 01/07/2010   MCV 84.4 01/07/2010   PLT 271 01/07/2010   Lab Results  Component Value Date   TSH 1.53 05/24/2016   Lab Results    Component Value Date   HGBA1C 5.8 12/07/2015   Assessment & Plan:     Stacy Campos is a 45 y.o. female here for   Blurry vision Could be related to dry eyes Advised artificial tears Referral to ophthalmology for more thorough exam and to check for cataracts as possible cause  HTN (hypertension) Uncontrolled Increase lisinopril 40 mg daily Patient to call and leave message about what her other medication she is taking is Check BMP today Follow-up in 2 weeks     [name], MD MPH PGY-3,  [facility] 06/08/2016  4:14 PM

## 2016-06-08 NOTE — Assessment & Plan Note (Signed)
Uncontrolled Increase lisinopril 40 mg daily Patient to call and leave message about what her other medication she is taking is Check BMP today Follow-up in 2 weeks

## 2016-06-08 NOTE — Assessment & Plan Note (Signed)
Could be related to dry eyes Advised artificial tears Referral to ophthalmology for more thorough exam and to check for cataracts as possible cause

## 2016-06-09 ENCOUNTER — Encounter: Payer: Self-pay | Admitting: Family Medicine

## 2016-06-09 ENCOUNTER — Telehealth: Payer: Self-pay | Admitting: Family Medicine

## 2016-06-09 NOTE — Telephone Encounter (Signed)
Pt already had a prescription for 10 mg of lisonpril and 20 mg of lisonpril.  Dr wanted to know bp meds she had prior to yesterdays visit.

## 2016-06-10 NOTE — Telephone Encounter (Signed)
Noted.  Discussed with patient yesterday that she needs to take only 40mg  of lisinopril daily (new Rx sent yesterday).    [name], MD, MPH PGY-3,  [facility] 06/10/2016 1:33 PM

## 2016-06-22 ENCOUNTER — Ambulatory Visit: Payer: 59 | Admitting: Family Medicine

## 2016-06-23 ENCOUNTER — Encounter: Payer: Self-pay | Admitting: Family Medicine

## 2016-06-23 ENCOUNTER — Ambulatory Visit (INDEPENDENT_AMBULATORY_CARE_PROVIDER_SITE_OTHER): Payer: 59 | Admitting: Family Medicine

## 2016-06-23 VITALS — BP 158/97 | HR 88 | Temp 98.3°F | Ht 67.0 in | Wt 290.8 lb

## 2016-06-23 DIAGNOSIS — I10 Essential (primary) hypertension: Secondary | ICD-10-CM | POA: Diagnosis not present

## 2016-06-23 DIAGNOSIS — M25562 Pain in left knee: Secondary | ICD-10-CM | POA: Insufficient documentation

## 2016-06-23 HISTORY — DX: Pain in left knee: M25.562

## 2016-06-23 NOTE — Assessment & Plan Note (Signed)
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

## 2016-06-23 NOTE — Patient Instructions (Signed)
Go get knee x-rays from [facility] imaging. You do not need an appointment.  Continue taking medications for blood pressure. At your request, we will not increase medications at this time but will follow-up in 2 weeks.  Take care, Dr. Jacinto Reap

## 2016-06-23 NOTE — Assessment & Plan Note (Signed)
Uncontrolled Continue lisinopril 40 mg daily Discussed recommendation to add another medication, but patient is hesitant as she thinks that her stress level will be decreasing and therefore her blood pressure will be decreasing Agreed to follow-up in 2 weeks and discuss medication regimen at that time

## 2016-06-23 NOTE — Progress Notes (Signed)
Subjective:   Stacy Campos is a 45 y.o. female with a history of HTN, hidradenitis, carpal tunnel syndrome here for hypertension follow-up  HTN: - Medications: Lisinopril 40 mg daily - Was seen in 11/3 and HCTZ discontinued due to side effects (diarrhea) and lisinopril started - Seen again 11/15 and lisinopril dose increased from 20 mg to 40 mg daily - was on amlodipine previously (caused LE edema) - Compliance: Good - Checking BP at home: No - Denies any SOB, CP, vision changes, LE edema, medication SEs, or symptoms of hypotension - Diet: Avoiding extra salt - Exercise: Walks around apartment building daily - thinks that BP will be improving since she lost her job (thinks this is a good thing because it was stressful)  Patient also reports left knee pain for the last 5 days. It occurred after she was doing more walking over the Thanksgiving holiday. She's been using Aleve as needed which helps with the pain and intermittent swelling. She reports that pain is anterior and worse with walking. She is tried ice which seems to help some. She did have what sounds like a meniscectomy about 20 years ago in that knee.   Review of Systems:  Per HPI.   Social History: Never smoker  Objective:  BP (!) 158/97   Pulse 88   Temp 98.3 F (36.8 C) (Oral)   Ht 5\' 7"  (1.702 m)   Wt 290 lb 12.8 oz (131.9 kg)   BMI 45.55 kg/m   Gen:  45 y.o. female in NAD HEENT: NCAT, MMM, EOMI, PERRL, anicteric sclerae CV: RRR, no MRG Resp: Non-labored, CTAB, no wheezes noted Ext: WWP, no edema MSK: L Knee: Normal to inspection with no erythema or effusion or obvious bony abnormalities. Palpation normal with no warmth or joint line tenderness or patellar tenderness or condyle tenderness. ROM normal in flexion and extension and lower leg rotation. Ligaments with solid consistent endpoints including ACL, PCL, LCL, MCL. Pain with  Mcmurray's. Non painful patellar compression. Patellar and quadriceps  tendons unremarkable. Hamstring and quadriceps strength is normal.  Neuro: Alert and oriented, speech normal       Chemistry      Component Value Date/Time   NA 136 06/08/2016 1609   K 3.7 06/08/2016 1609   CL 102 06/08/2016 1609   CO2 25 06/08/2016 1609   BUN 7 06/08/2016 1609   CREATININE 0.70 06/08/2016 1609      Component Value Date/Time   CALCIUM 8.7 06/08/2016 1609   ALKPHOS 58 12/07/2015 1203   AST 16 12/07/2015 1203   ALT 17 12/07/2015 1203   BILITOT 0.4 12/07/2015 1203      Lab Results  Component Value Date   WBC 7.6 01/07/2010   HGB 13.0 01/07/2010   HCT 40.5 01/07/2010   MCV 84.4 01/07/2010   PLT 271 01/07/2010   Lab Results  Component Value Date   TSH 1.53 05/24/2016   Lab Results  Component Value Date   HGBA1C 5.8 12/07/2015   Assessment & Plan:     Stacy Campos is a 45 y.o. female here for   HTN (hypertension) Uncontrolled Continue lisinopril 40 mg daily Discussed recommendation to add another medication, but patient is hesitant as she thinks that her stress level will be decreasing and therefore her blood pressure will be decreasing Agreed to follow-up in 2 weeks and discuss medication regimen at that time  Left knee pain Suspect osteoarthritis in the setting of partial meniscectomy 20 years ago Exam mostly unremarkable Offered  corticosteroid injection today, patient declines Continue as needed NSAIDs, ice, elevation     [name], MD MPH PGY-3,  [facility] 06/23/2016  5:22 PM

## 2016-07-08 ENCOUNTER — Ambulatory Visit: Payer: 59 | Admitting: Family Medicine

## 2016-07-08 NOTE — Progress Notes (Deleted)
   Subjective:   Stacy Campos is a 45 y.o. female with a history of HTN, hidradenitis, carpal tunnel syndrome here for hypertension follow-up  HTN: - Medications: Lisinopril 40 mg daily - Was seen in 11/3 and HCTZ discontinued due to side effects (diarrhea) and lisinopril started - Seen again 11/15 and lisinopril dose increased from 20 mg to 40 mg daily - last seen 11/30 - was uncontrolled, but refused to add another medication - was on amlodipine previously (caused LE edema) - Compliance: Good - Checking BP at home: No - Denies any SOB, CP, vision changes, LE edema, medication SEs, or symptoms of hypotension - Diet: Avoiding extra salt - Exercise: Walks around apartment building daily - thinks that BP will be improving since she lost her job (thinks this is a good thing because it was stressful) ***  Patient also reports left knee pain for the last 5 days. It occurred after she was doing more walking over the Thanksgiving holiday. She's been using Aleve as needed which helps with the pain and intermittent swelling. She reports that pain is anterior and worse with walking. She is tried ice which seems to help some. She did have what sounds like a meniscectomy about 20 years ago in that knee.   Review of Systems:  Per HPI.   Social History: Never smoker  Objective:  There were no vitals taken for this visit.  Gen:  45 y.o. female in NAD HEENT: NCAT, MMM, EOMI, PERRL, anicteric sclerae CV: RRR, no MRG Resp: Non-labored, CTAB, no wheezes noted Ext: WWP, no edema MSK: L Knee: Normal to inspection with no erythema or effusion or obvious bony abnormalities. Palpation normal with no warmth or joint line tenderness or patellar tenderness or condyle tenderness. ROM normal in flexion and extension and lower leg rotation. Ligaments with solid consistent endpoints including ACL, PCL, LCL, MCL. Pain with  Mcmurray's. Non painful patellar compression. Patellar and quadriceps tendons  unremarkable. Hamstring and quadriceps strength is normal. ***  Neuro: Alert and oriented, speech normal       Chemistry      Component Value Date/Time   NA 136 06/08/2016 1609   K 3.7 06/08/2016 1609   CL 102 06/08/2016 1609   CO2 25 06/08/2016 1609   BUN 7 06/08/2016 1609   CREATININE 0.70 06/08/2016 1609      Component Value Date/Time   CALCIUM 8.7 06/08/2016 1609   ALKPHOS 58 12/07/2015 1203   AST 16 12/07/2015 1203   ALT 17 12/07/2015 1203   BILITOT 0.4 12/07/2015 1203      Lab Results  Component Value Date   WBC 7.6 01/07/2010   HGB 13.0 01/07/2010   HCT 40.5 01/07/2010   MCV 84.4 01/07/2010   PLT 271 01/07/2010   Lab Results  Component Value Date   TSH 1.53 05/24/2016   Lab Results  Component Value Date   HGBA1C 5.8 12/07/2015   Assessment & Plan:     Stacy Campos is a 45 y.o. female here for   No problem-specific Assessment & Plan notes found for this encounter.     [name], MD MPH PGY-3,  [facility] 07/08/2016  3:29 PM

## 2016-09-19 ENCOUNTER — Encounter: Payer: Self-pay | Admitting: Internal Medicine

## 2016-09-19 ENCOUNTER — Ambulatory Visit (HOSPITAL_COMMUNITY)
Admission: RE | Admit: 2016-09-19 | Discharge: 2016-09-19 | Disposition: A | Discharge status: Home / Self Care | Payer: Medicaid Other | Source: Ambulatory Visit | Attending: Family Medicine | Admitting: Family Medicine

## 2016-09-19 ENCOUNTER — Ambulatory Visit (INDEPENDENT_AMBULATORY_CARE_PROVIDER_SITE_OTHER): Payer: Self-pay | Admitting: Internal Medicine

## 2016-09-19 VITALS — BP 146/94 | HR 85 | Temp 97.8°F | Wt 297.0 lb

## 2016-09-19 DIAGNOSIS — R079 Chest pain, unspecified: Secondary | ICD-10-CM

## 2016-09-19 DIAGNOSIS — I10 Essential (primary) hypertension: Secondary | ICD-10-CM

## 2016-09-19 DIAGNOSIS — N912 Amenorrhea, unspecified: Secondary | ICD-10-CM

## 2016-09-19 DIAGNOSIS — R0789 Other chest pain: Secondary | ICD-10-CM

## 2016-09-19 DIAGNOSIS — N644 Mastodynia: Secondary | ICD-10-CM

## 2016-09-19 MED ORDER — CHLORTHALIDONE 25 MG PO TABS: 25.0000 mg | ORAL_TABLET | Freq: Every day | ORAL | 0 refills | Status: DC

## 2016-09-19 MED ORDER — NAPROXEN 250 MG PO TABS: 250.0000 mg | ORAL_TABLET | Freq: Three times a day (TID) | ORAL | 0 refills | Status: DC | PRN

## 2016-09-19 NOTE — Patient Instructions (Addendum)
Lets start Chlorthalidone 25mg  daily. Continue Lisinopril daily. Please follow up in 1-2 weeks with your PCP.  I made a referral to cardiology. Please come to the lab tomorrow to complete a urine pregnancy test.  Please set up to get a mammogram

## 2016-09-19 NOTE — Progress Notes (Addendum)
[facility] Phone: [phone]   Date of Visit: 09/19/2016   HPI:  Stacy Campos is a 46 y.o. female presenting to clinic today for same day appointment. PCP: Lavon Paganini, MD Concerns today include:  HTN:  - Medication: Lisinopril 40mg  daily - reports that her BP is elevated at home, mainly in 160/90 - denies HA - reports of chronic intermittent blurred vision; was seen by opthalmology and did not find any abnormalities. Not worsening - reports of chest pain for the past month. Pain is "piercing" and located centrally; starts on the left sternal border and radiates to the right. Has some associated shortness of breath. Lasts about 2 minutes. Not worsened with exertion. Not worse after meal. Has some shortness of breath associated with it. No nausea or diaphoresis. Episodes started about 1 month ago and occur about once a week. Last episode was on Saturday. Has family history of heart disease. Reports that her father needed a heart stent at the age of 70 but he declined. He passed away from a stroke at age 1yo.  - never smoker - didn't tolearate Norvasc (LE swelling) or HCTZ ( diarrhea) in the past   Tender Breasts:  - reports of tender nipples and areola bilaterally recently  - no discharge - no new masses/lumps that she has noticed - reports that the RUQ of the right breast has intermittent tingling  - last mammogram was in 2010 which was normal. She was not aware mammograms were every 1-2 years  - reports that since she had her IUD removed in 01/2016, she has been amenorrheic. She is sexually active with her husband. Her husband has a vasectomy.  - reports of hydradenitis in axilla (chronic recurring issues).    ROS: See HPI.  [place]:  PMH: HTN Carpal Tunnel Syndrome Hydradenitis   PHYSICAL EXAM: BP (!) 146/94   Pulse 85   Temp 97.8 F (36.6 C) (Oral)   Wt 297 lb (134.7 kg)   SpO2 96%   BMI 46.52 kg/m  GEN: NAD CV: RRR, no murmurs,  rubs, or gallops Breast Exam: skin unremarkable with normal appearing nipples and areola. On palpation, patient is tender to palpation of the upper quadrants of each breasts but no masses/abnormalities palpated. Axilla: right axilla tender to palpation but to palpable lymph nodes. Left axilla: tender to palpation but to palpable lymph nodes. mild hydradenitis lesion x 2 that are not draining.  PULM: CTAB, normal effort ABD: Soft, nontender, nondistended, NABS, no organomegaly SKIN: No rash or cyanosis; warm and well-perfused PSYCH: Mood and affect euthymic, normal rate and volume of speech NEURO: Awake, alert, no focal deficits grossly, normal speech   ASSESSMENT/PLAN:  Health Maintenance:  - mammogram.   Breast Tenderness:  Unclear in etiology. Breast exam with somewhat diffuse tenderness in the upper quadrants of each breasts. Also tender in the axillary regions bilaterally without any specific palpable lymph nodes. Has not had a mammogram since 2010. Will obtain urine pregnancy tests to ensure symptoms not due to pregnancy although she has not other symptoms to support this. Due to breast tenderness, imaging center requests Diagnostic mammogram with ultrasound (ordered). Then follow up with PCP.   HTN (hypertension) Repeat blood pressure improved but still above goal for patient. Will add Chlorthalidone 25mg  daily. Continue Lisinopril. Chest pain sounds atypical in nature however, she does have RF including HTN, Obesity, and Family History. Will refer to cardiology for work up.  - follow up in 1-2 weeks with PCP for  HTN    Smiley Houseman, MD PGY [place]

## 2016-09-20 ENCOUNTER — Other Ambulatory Visit: Payer: Self-pay | Admitting: Family Medicine

## 2016-09-20 DIAGNOSIS — R5381 Other malaise: Secondary | ICD-10-CM

## 2016-09-20 DIAGNOSIS — Z1231 Encounter for screening mammogram for malignant neoplasm of breast: Secondary | ICD-10-CM

## 2016-09-20 NOTE — Assessment & Plan Note (Signed)
Repeat blood pressure improved but still above goal for patient. Will add Chlorthalidone 25mg  daily. Continue Lisinopril. Chest pain sounds atypical in nature however, she does have RF including HTN, Obesity, and Family History. Will refer to cardiology for work up.  - follow up in 1-2 weeks with PCP for HTN

## 2016-09-20 NOTE — Addendum Note (Signed)
Addended by: Levert Feinstein F on: 09/20/2016 11:23 AM   Modules accepted: Orders

## 2016-09-20 NOTE — Addendum Note (Signed)
Addended by: Smiley Houseman on: 09/20/2016 11:12 AM   Modules accepted: Orders

## 2016-09-23 ENCOUNTER — Ambulatory Visit (INDEPENDENT_AMBULATORY_CARE_PROVIDER_SITE_OTHER): Payer: Medicaid Other | Admitting: Cardiovascular Disease

## 2016-09-23 ENCOUNTER — Ambulatory Visit
Admission: RE | Admit: 2016-09-23 | Discharge: 2016-09-23 | Disposition: A | Discharge status: Home / Self Care | Payer: Medicaid Other | Source: Ambulatory Visit | Attending: Family Medicine | Admitting: Family Medicine

## 2016-09-23 ENCOUNTER — Encounter: Payer: Self-pay | Admitting: Cardiovascular Disease

## 2016-09-23 DIAGNOSIS — R0789 Other chest pain: Secondary | ICD-10-CM

## 2016-09-23 DIAGNOSIS — N644 Mastodynia: Secondary | ICD-10-CM

## 2016-09-23 DIAGNOSIS — R079 Chest pain, unspecified: Secondary | ICD-10-CM | POA: Insufficient documentation

## 2016-09-23 DIAGNOSIS — I10 Essential (primary) hypertension: Secondary | ICD-10-CM

## 2016-09-23 HISTORY — DX: Chest pain, unspecified: R07.9

## 2016-09-23 IMAGING — MG 2D DIGITAL DIAGNOSTIC BILATERAL MAMMOGRAM WITH CAD AND ADJUNCT T
8 of 18 series · 8 of 34 positions shown · non-contrast
Comparison: None.

ACR Breast Density Category a: The breast tissue is almost entirely
fatty.

CLINICAL DATA: Patient describes diffuse bilateral breast pain.
Patient denies palpable lump.

EXAM:
2D DIGITAL DIAGNOSTIC BILATERAL MAMMOGRAM WITH CAD AND ADJUNCT TOMO

[L MLO (1 of 2)]
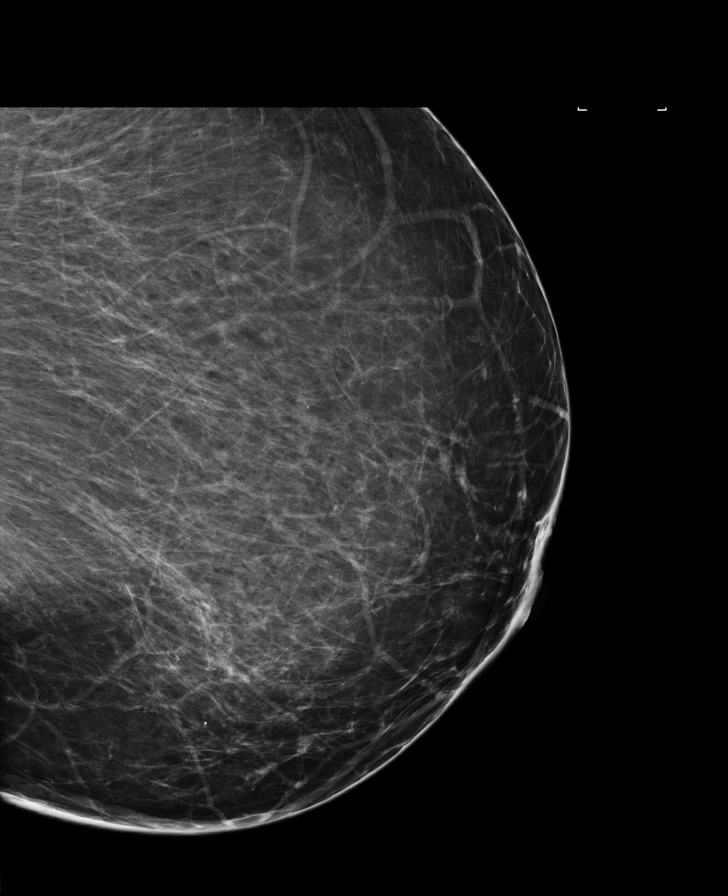

[L CC (1 of 2)]
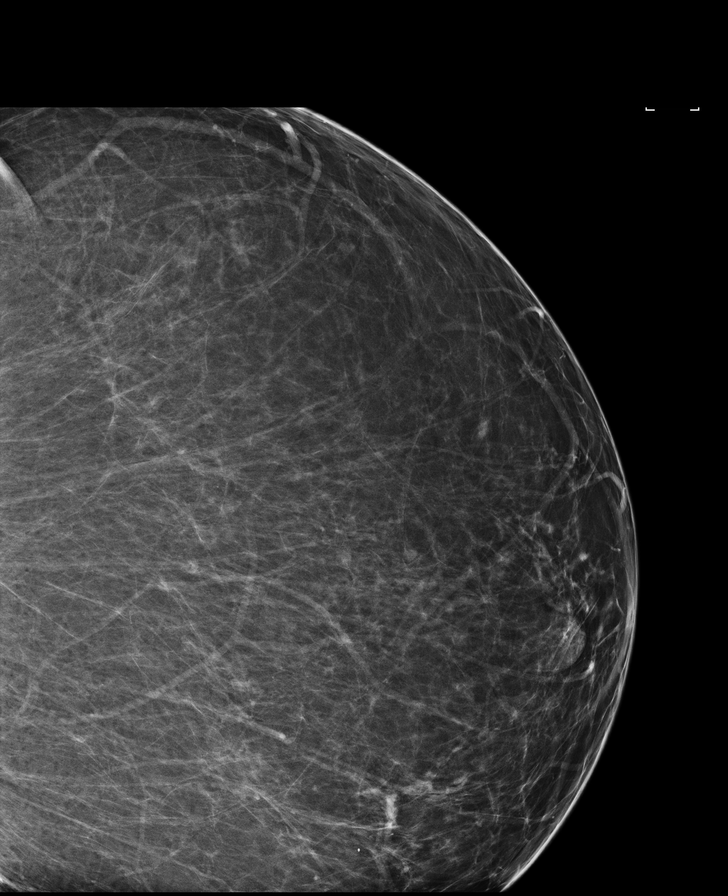

[R CC]
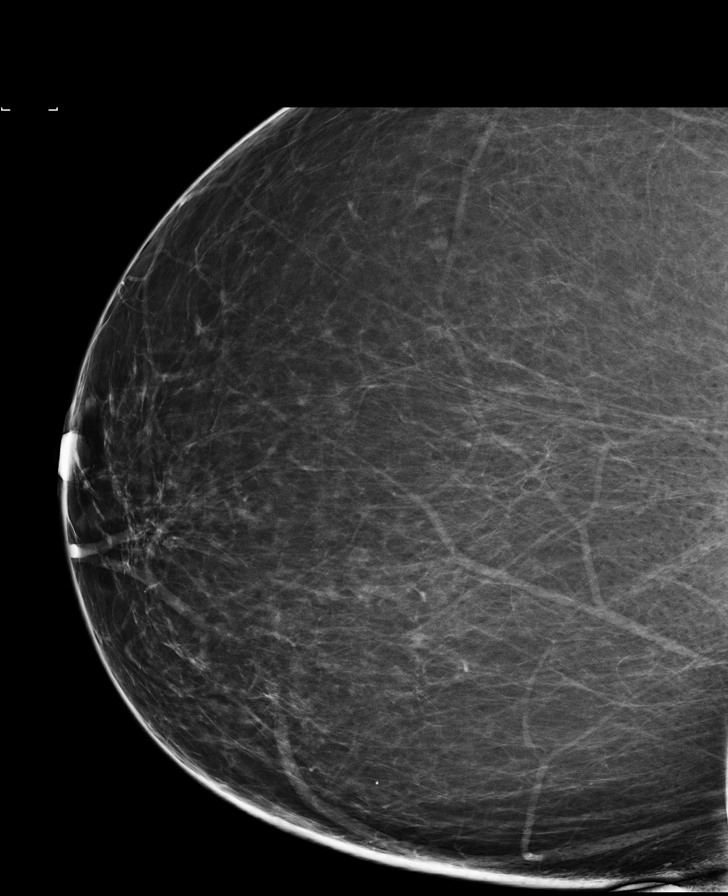

[L MLO synth-2D]
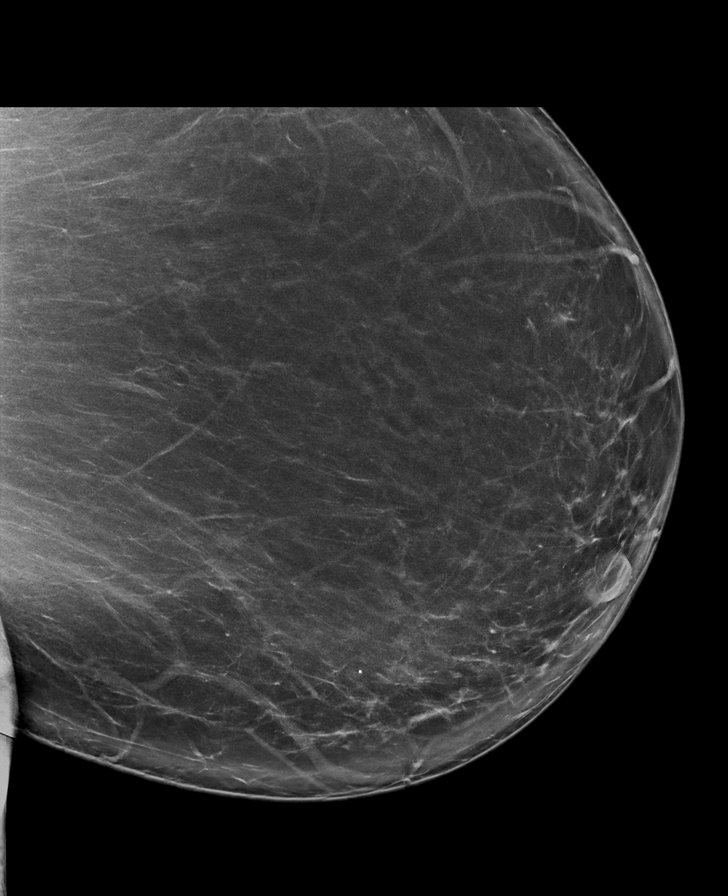

[L MLO (2 of 2)]
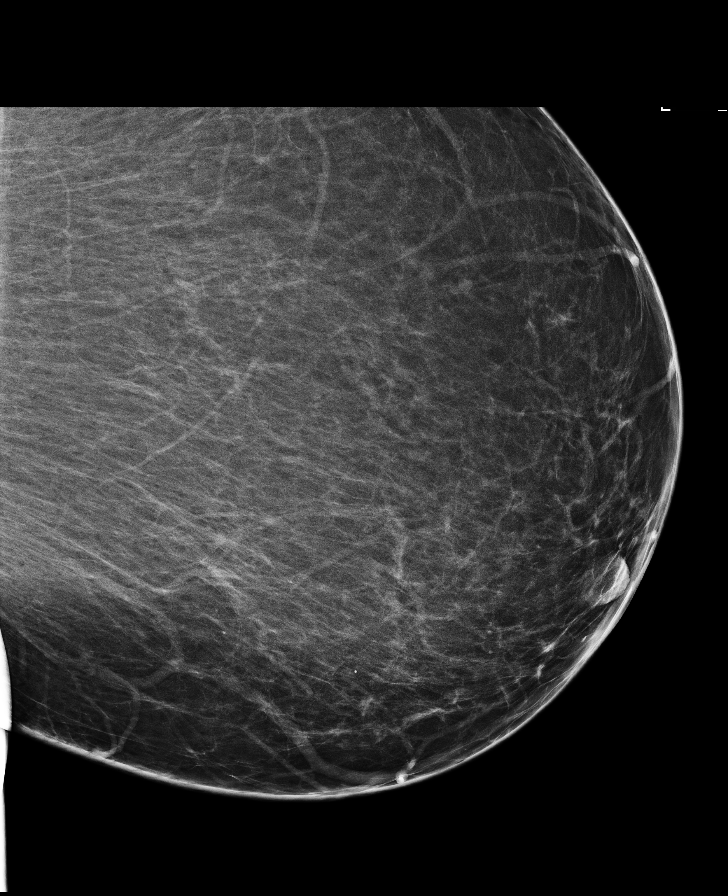

[R CC synth-2D]
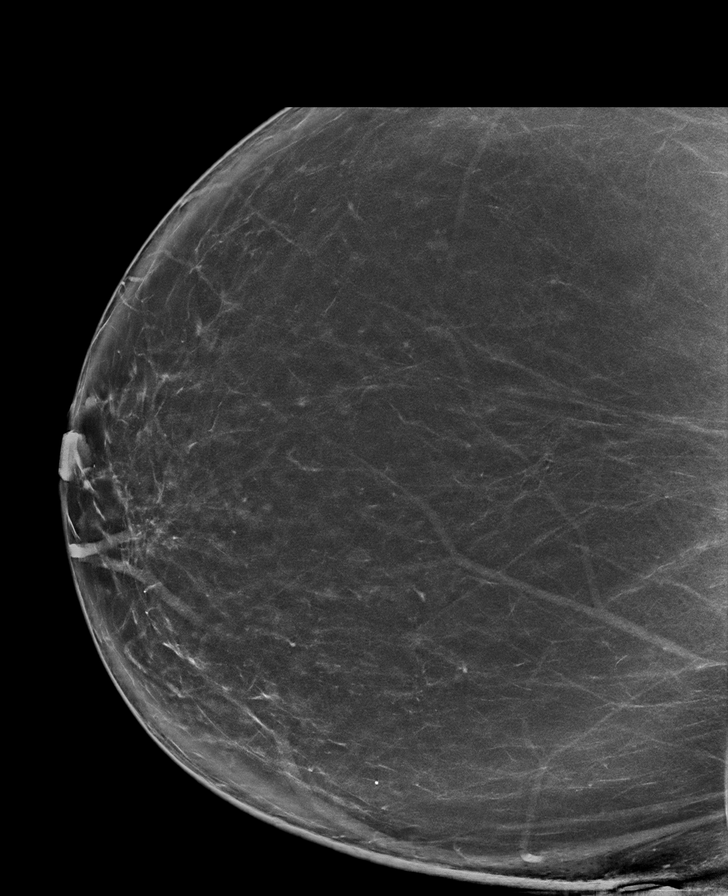

[L CC (2 of 2)]
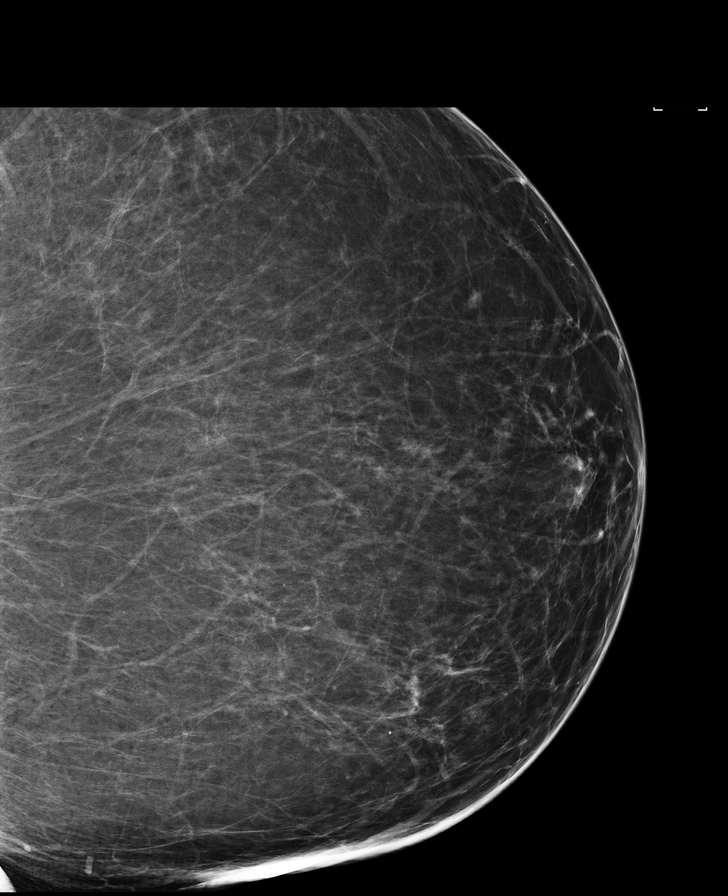

[R MLO]
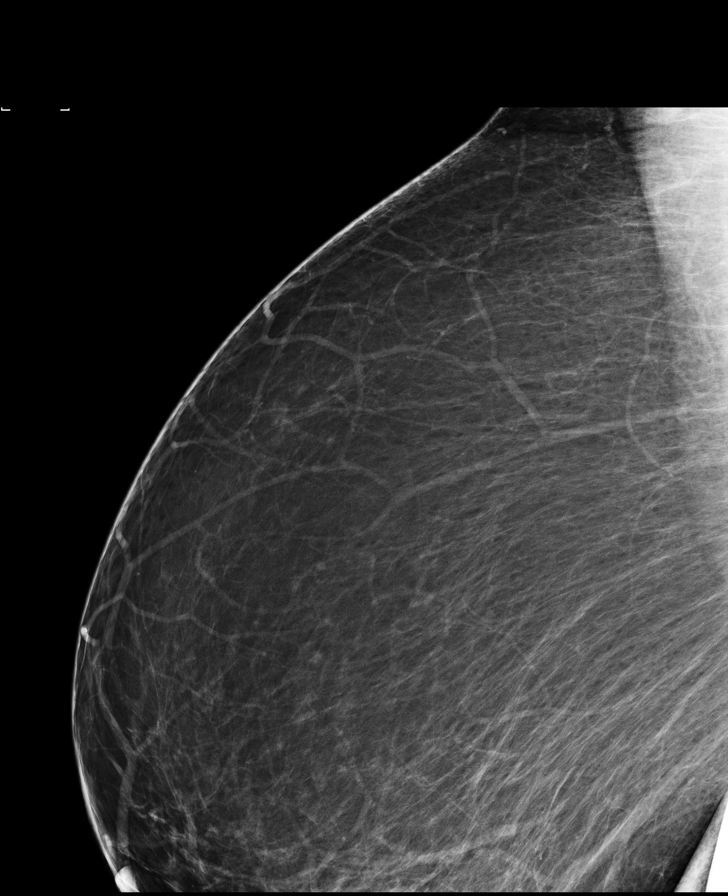

[8 of 34 positions shown; findings below may reference images not displayed]

FINDINGS: Bilateral 2D CC and MLO projections were obtained, with additional
3D tomosynthesis.

There are no masses, suspicious calcifications or secondary signs of
malignancy identified within either breast.

Mammographic images were processed with CAD.
IMPRESSION: No evidence of malignancy within either breast.

RECOMMENDATION:
Screening mammogram in one year.(Code:[K8])

Benign causes of breast pain, and possible remedies, were discussed
with the patient. Patient was encouraged to follow-up with referring
physician if pain became localized and persistent or if a palpable
lump/mass developed.

I have discussed the findings and recommendations with the patient.
Results were also provided in writing at the conclusion of the
visit. If applicable, a reminder letter will be sent to the patient
regarding the next appointment.

BI-RADS CATEGORY  1: Negative.

## 2016-09-23 NOTE — Assessment & Plan Note (Signed)
History of hypertension blood pressure measured 152/103. She is on lisinopril and chlorthalidone followed by her PCP.

## 2016-09-23 NOTE — Assessment & Plan Note (Signed)
Stacy Campos was referred by her PCP for evaluation atypical chest pain. This began 3 weeks ago coincident with getting the news that her sister had come out of remission from cancer. Her mother also is in poor health. The pain occurs several times a week lasting 10 minutes at a time. It is sharp and piercing. The pain does not radiate nor are there other associated symptoms. She basically has no cardiac risk factors other then hypertension. At this point, I do not think that the pain is ischemically mediated. I think it's related to stress. I will see her back in 3 months for follow-up. Should she have continued pain or accelerating pain she'll need sooner evaluation.

## 2016-09-23 NOTE — Progress Notes (Signed)
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 She was referred by her PCP for evaluation of atypical chest pain. She does have history of hypertension on lisinopril and chlorthalidone. She was intolerant to high 2.5 side and amlodipine. Apparently her sister who is in remission from cancer has recently developed recurrent cancer in her mother's poor health. She got this needs to weeks ago coincident with the onset of atypical chest pain which is sharp and piercing lasting 10 minutes at a time without radiation or other symptoms.   Current Outpatient Prescriptions  Medication Sig Dispense Refill  . acetaminophen (TYLENOL) 500 MG tablet Take 1,500 mg by mouth every 6 (six) hours as needed for mild pain.     . chlorhexidine (HIBICLENS) 4 % external liquid Apply topically daily as needed. In axillas 236 mL 2  . chlorthalidone (HYGROTON) 25 MG tablet Take 1 tablet (25 mg total) by mouth daily. 30 tablet 0  . cholecalciferol (VITAMIN D) 1000 units tablet Take 1,000 Units by mouth daily.    . clindamycin (CLINDAGEL) 1 % gel Apply topically 2 (two) times daily. 30 g 1  . lisinopril (PRINIVIL,ZESTRIL) 40 MG tablet Take 1 tablet (40 mg total) by mouth daily. 30 tablet 2  . Multiple Vitamin (MULTIVITAMIN) capsule Take 1 capsule by mouth daily.     No current facility-administered medications for this visit.     Allergies  Allergen Reactions  . Dimetapp [Albertsons Di Bromm] Hives    Social History   Social History  . Marital status: Legally Separated    Spouse name: N/A  . Number of children: 3  . Years of education: N/A   Occupational History  . [employer] History Main Topics  . Smoking status: Never Smoker  . Smokeless tobacco: Never Used  . Alcohol use 0.0 oz/week     Comment: 3 times per year  . Drug use: No  . Sexual activity: Not Currently   Other Topics Concern  . Not on file   Social History Narrative  . No narrative on file     Review of Systems: General: negative for chills, fever, night sweats or weight changes.  Cardiovascular: negative for chest pain, dyspnea on exertion, edema, orthopnea, palpitations, paroxysmal nocturnal dyspnea or shortness of breath Dermatological: negative for rash Respiratory: negative for cough or wheezing Urologic: negative for hematuria Abdominal: negative for nausea, vomiting, diarrhea, bright red blood per rectum, melena, or hematemesis Neurologic: negative for visual changes, syncope, or dizziness All other systems reviewed and are otherwise negative except as noted above.    Blood pressure (!) 152/103, pulse 92, height 5\' 7"  (1.702 m), weight 293 lb 3.2 oz (133 kg), SpO2 97 %.  General appearance: alert and no distress Neck: no adenopathy, no carotid bruit, no JVD, supple, symmetrical, trachea midline and thyroid not enlarged, symmetric, no tenderness/mass/nodules Lungs: clear to auscultation bilaterally Heart: regular rate and rhythm, S1, S2 normal, no murmur, click, rub or gallop Extremities: extremities normal, atraumatic, no cyanosis or edema  EKG not performed today  ASSESSMENT AND PLAN:   HTN (hypertension) History of hypertension blood pressure measured 152/103. She is on lisinopril and chlorthalidone  followed by her PCP.  Atypical chest pain Stacy. Campos was referred by her PCP for evaluation atypical chest pain. This began 3 weeks ago coincident with getting the news that her sister had come out of remission from cancer. Her mother also is in poor health. The pain occurs several times a week lasting 10 minutes at a time. It is sharp and piercing. The pain does not  radiate nor are there other associated symptoms. She basically has no cardiac risk factors other then hypertension. At this point, I do not think that the pain is ischemically mediated. I think it's related to stress. I will see her back in 3 months for follow-up. Should she have continued pain or accelerating pain she'll need sooner evaluation.      Stacy Harp MD [place], [facility] 09/23/2016 4:07 PM

## 2016-09-23 NOTE — Patient Instructions (Signed)
Medication Instructions: Your physician recommends that you continue on your current medications as directed. Please refer to the Current Medication list given to you today.   Follow-Up: Your physician recommends that you schedule a follow-up appointment in: 3 months with Dr. Berry.  If you need a refill on your cardiac medications before your next appointment, please call your pharmacy.  

## 2016-12-07 ENCOUNTER — Encounter: Payer: Self-pay | Admitting: Gynecology

## 2017-01-06 ENCOUNTER — Encounter: Payer: Self-pay | Admitting: Cardiovascular Disease

## 2017-01-06 ENCOUNTER — Ambulatory Visit (INDEPENDENT_AMBULATORY_CARE_PROVIDER_SITE_OTHER): Payer: Medicaid Other | Admitting: Cardiovascular Disease

## 2017-01-06 VITALS — BP 158/98 | HR 96 | Ht 67.0 in | Wt 291.0 lb

## 2017-01-06 DIAGNOSIS — I10 Essential (primary) hypertension: Secondary | ICD-10-CM | POA: Diagnosis not present

## 2017-01-06 DIAGNOSIS — R0789 Other chest pain: Secondary | ICD-10-CM

## 2017-01-06 MED ORDER — LOSARTAN POTASSIUM 50 MG PO TABS: 50.0000 mg | ORAL_TABLET | Freq: Every day | ORAL | 3 refills | Status: DC

## 2017-01-06 MED ORDER — CHLORTHALIDONE 25 MG PO TABS: 25.0000 mg | ORAL_TABLET | Freq: Every day | ORAL | 3 refills | Status: DC

## 2017-01-06 NOTE — Patient Instructions (Signed)
Medication Instructions: START Losartan 50 mg daily.   Labwork: Your physician recommends that you return for lab work in: 2 weeks--BMET   Follow-Up: Your physician recommends that you schedule a follow-up appointment in: 1 month with [facility]. Your physician has requested that you regularly monitor and record your blood pressure readings at home. Please use the same machine at the same time of day to check your readings and record them to bring to your follow-up visit.  Your physician wants you to follow-up in: 6 months with Dr. Gwenlyn Found. You will receive a reminder letter in the mail two months in advance. If you don't receive a letter, please call our office to schedule the follow-up appointment.  If you need a refill on your cardiac medications before your next appointment, please call your pharmacy.

## 2017-01-06 NOTE — Progress Notes (Signed)
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 She was referred by her PCP for evaluation of atypical chest pain. She does have history of hypertension on lisinopril and chlorthalidone. She was intolerant to high 2.5 side and amlodipine. Apparently her sister who is in remission from cancer has recently developed recurrent cancer in her mother's poor health. She got this needs to weeks ago coincident with the onset of atypical chest pain which is sharp and piercing lasting 10 minutes at a time without radiation or other symptoms. Since I saw her last she continues to have infrequent atypical chest pain. Her blood pressure is elevated at 158/98 today. She is aware of salt restriction. She stopped taking her lisinopril on her own because of changes in her vision which she attributes to this.   Current Outpatient Prescriptions  Medication Sig Dispense Refill  . acetaminophen (TYLENOL) 500 MG tablet Take 1,500 mg by mouth every 6 (six) hours as needed for mild pain.     . Multiple Vitamin (MULTIVITAMIN) capsule Take 1 capsule by mouth daily.     No current facility-administered medications for this visit.     Allergies  Allergen Reactions  . Dimetapp [Albertsons Di Bromm] Hives    Social History   Social History  . Marital status: Legally Separated    Spouse name: N/A  . Number of children: 3  . Years of education: N/A   Occupational History  . [place] History Main Topics  . Smoking status: Never Smoker  . Smokeless tobacco: Never Used  . Alcohol use 0.0 oz/week     Comment: 3 times per year  . Drug use: No  . Sexual activity: Not  Currently   Other Topics Concern  . Not on file   Social History Narrative  . No narrative on file     Review of Systems: General: negative for chills, fever, night sweats or weight changes.  Cardiovascular: negative for chest pain, dyspnea on exertion, edema, orthopnea, palpitations, paroxysmal nocturnal dyspnea or shortness of breath Dermatological: negative for rash Respiratory: negative for cough or wheezing Urologic: negative for hematuria Abdominal: negative for nausea, vomiting, diarrhea, bright red blood per rectum, melena, or hematemesis Neurologic: negative for visual changes, syncope, or dizziness All other systems reviewed and are otherwise negative except as noted above.    Blood pressure (!) 158/98, pulse 96, height 5\' 7"  (1.702 m), weight 291 lb (132 kg), SpO2 99 %.  General appearance: alert and no distress Neck: no adenopathy, no carotid bruit, no JVD, supple, symmetrical, trachea midline and thyroid not enlarged, symmetric, no tenderness/mass/nodules Lungs: clear to auscultation bilaterally Heart: regular rate and rhythm, S1, S2 normal, no murmur, click, rub or gallop Extremities: extremities normal, atraumatic, no cyanosis or edema  EKG not performed today  ASSESSMENT AND PLAN:   HTN (hypertension) History of hypertension currently on chlorthalidone. She was on lisinopril which caused changes in her vision and she stopped this. Her blood pressure today is 150/98 which she measures it at home is in the 150s over 90s as well. She is aware of salt restriction. I'm going to add losartan 50 mg a day. She  will keep a blood pressure log of Days. We'll check a basic medical panel in 2-3 weeks and she'll see Erasmo Downer back in follow-up in one month for review of her log, lab work to titrate her medications.  Atypical chest pain History of atypical chest pain with pain occurring seconds that time approximately once weekly. This does not sound cardiac.      Lorretta Harp MD FACP,FACC,FAHA, [facility] 01/06/2017 3:57 PM

## 2017-01-06 NOTE — Assessment & Plan Note (Signed)
History of atypical chest pain with pain occurring seconds that time approximately once weekly. This does not sound cardiac.

## 2017-01-06 NOTE — Assessment & Plan Note (Signed)
History of hypertension currently on chlorthalidone. She was on lisinopril which caused changes in her vision and she stopped this. Her blood pressure today is 150/98 which she measures it at home is in the 150s over 90s as well. She is aware of salt restriction. I'm going to add losartan 50 mg a day. She will keep a blood pressure log of Days. We'll check a basic medical panel in 2-3 weeks and she'll see Erasmo Downer back in follow-up in one month for review of her log, lab work to titrate her medications.

## 2017-01-27 ENCOUNTER — Ambulatory Visit: Payer: Medicaid Other | Admitting: Family Medicine

## 2017-01-27 ENCOUNTER — Ambulatory Visit: Payer: Medicaid Other | Admitting: Internal Medicine

## 2017-01-30 ENCOUNTER — Ambulatory Visit (INDEPENDENT_AMBULATORY_CARE_PROVIDER_SITE_OTHER): Payer: Medicaid Other | Admitting: Internal Medicine

## 2017-01-30 ENCOUNTER — Encounter: Payer: Self-pay | Admitting: Internal Medicine

## 2017-01-30 VITALS — BP 154/108 | HR 70 | Temp 98.3°F | Wt 295.6 lb

## 2017-01-30 DIAGNOSIS — L732 Hidradenitis suppurativa: Secondary | ICD-10-CM | POA: Diagnosis present

## 2017-01-30 MED ORDER — DOXYCYCLINE HYCLATE 100 MG PO CAPS: 100.0000 mg | ORAL_CAPSULE | Freq: Two times a day (BID) | ORAL | 0 refills | Status: DC

## 2017-01-30 NOTE — Progress Notes (Signed)
   Subjective:    Stacy Campos - 46 y.o. female MRN [id number]  Date of birth: December 27, 1970  HPI  Stacy Campos is here for boils in armpit and under breast. Have been present for about 3-4 weeks. Lesion under left breast was initially red but is now flesh colored without any drainage. Lesions in left axilla are draining. They are all very tender. She has been placing ice in the area under her breast. She denies fevers, nausea, and vomiting. She reports history of hydradenitis.   -  reports that she has never smoked. She has never used smokeless tobacco. - Review of Systems: Per HPI. - Past Medical History: Patient Active Problem List   Diagnosis Date Noted  . Atypical chest pain 09/23/2016  . Left knee pain 06/23/2016  . Blurry vision 06/08/2016  . Carpal tunnel syndrome 05/17/2016  . Hydradenitis 12/07/2015  . HTN (hypertension) 01/07/2010   - Medications: reviewed and updated   Objective:   Physical Exam BP (!) 154/108   Pulse 70   Temp 98.3 F (36.8 C) (Oral)   Wt 295 lb 9.6 oz (134.1 kg)   SpO2 97%   BMI 46.30 kg/m  Gen: NAD, alert, cooperative with exam, well-appearing Skin: Multiple draining lesions in left axilla that are TTP and with surrounding erythema. Small, approximately 1 cm by 1 cm area of induration under left breast. Area is flesh colored without surrounding erythema and TTP.      Assessment & Plan:   1. Hydradenitis Have prescribed Doxycyline for hydradenitis flare in left axilla with signs of localized infection. No drain-able abscesses in axilla at present. Left breast lesion small and indurated without appreciable area of fluctuance or signs of surrounding soft tissue infection. Have recommended patient apply warm compresses to the area several times per day. Return later this week for follow up and possible I&D.   - doxycycline (VIBRAMYCIN) 100 MG capsule; Take 1 capsule (100 mg total) by mouth 2 (two) times daily.  Dispense: 20 capsule; Refill:  0   Phill Myron, D.O. 01/30/2017, 3:02 PM PGY-3, [place]

## 2017-01-30 NOTE — Patient Instructions (Signed)
I have prescribed Doxycycline to help prevent/contain infection. Take this twice per day for ten days.   Apply warm compresses to the armpit and the breast lesion four times a day until follow up.   Please schedule on Friday for possible drainage of the lesions.

## 2017-02-03 ENCOUNTER — Encounter (HOSPITAL_COMMUNITY): Payer: Self-pay | Admitting: General Practice

## 2017-02-03 ENCOUNTER — Encounter: Payer: Self-pay | Admitting: Internal Medicine

## 2017-02-03 ENCOUNTER — Ambulatory Visit (INDEPENDENT_AMBULATORY_CARE_PROVIDER_SITE_OTHER): Payer: Medicaid Other | Admitting: Internal Medicine

## 2017-02-03 ENCOUNTER — Inpatient Hospital Stay (HOSPITAL_COMMUNITY)
Admission: AD | Admit: 2017-02-03 | Discharge: 2017-02-06 | DRG: 607 | Disposition: A | Discharge status: Home / Self Care | Payer: Medicaid Other | Source: Ambulatory Visit | Attending: Family Medicine | Admitting: Family Medicine

## 2017-02-03 VITALS — BP 144/90 | HR 91 | Temp 98.2°F | Wt 295.0 lb

## 2017-02-03 DIAGNOSIS — Z8349 Family history of other endocrine, nutritional and metabolic diseases: Secondary | ICD-10-CM

## 2017-02-03 DIAGNOSIS — Z87891 Personal history of nicotine dependence: Secondary | ICD-10-CM

## 2017-02-03 DIAGNOSIS — L723 Sebaceous cyst: Secondary | ICD-10-CM | POA: Diagnosis present

## 2017-02-03 DIAGNOSIS — E669 Obesity, unspecified: Secondary | ICD-10-CM | POA: Diagnosis present

## 2017-02-03 DIAGNOSIS — Z823 Family history of stroke: Secondary | ICD-10-CM | POA: Diagnosis not present

## 2017-02-03 DIAGNOSIS — Z8249 Family history of ischemic heart disease and other diseases of the circulatory system: Secondary | ICD-10-CM

## 2017-02-03 DIAGNOSIS — N644 Mastodynia: Secondary | ICD-10-CM | POA: Diagnosis present

## 2017-02-03 DIAGNOSIS — M79622 Pain in left upper arm: Secondary | ICD-10-CM

## 2017-02-03 DIAGNOSIS — Z833 Family history of diabetes mellitus: Secondary | ICD-10-CM | POA: Diagnosis not present

## 2017-02-03 DIAGNOSIS — L732 Hidradenitis suppurativa: Secondary | ICD-10-CM

## 2017-02-03 DIAGNOSIS — R51 Headache: Secondary | ICD-10-CM | POA: Diagnosis present

## 2017-02-03 DIAGNOSIS — Z803 Family history of malignant neoplasm of breast: Secondary | ICD-10-CM | POA: Diagnosis not present

## 2017-02-03 DIAGNOSIS — I10 Essential (primary) hypertension: Secondary | ICD-10-CM | POA: Diagnosis present

## 2017-02-03 DIAGNOSIS — M419 Scoliosis, unspecified: Secondary | ICD-10-CM | POA: Diagnosis present

## 2017-02-03 HISTORY — DX: Low back pain: M54.5

## 2017-02-03 HISTORY — DX: Unspecified osteoarthritis, unspecified site: M19.90

## 2017-02-03 HISTORY — DX: Other chronic pain: G89.29

## 2017-02-03 HISTORY — DX: Low back pain, unspecified: M54.50

## 2017-02-03 HISTORY — DX: Headache, unspecified: R51.9

## 2017-02-03 HISTORY — DX: Headache: R51

## 2017-02-03 HISTORY — DX: Hidradenitis suppurativa: L73.2

## 2017-02-03 HISTORY — DX: Pain in left upper arm: M79.622

## 2017-02-03 MED ORDER — ACETAMINOPHEN 325 MG PO TABS
650.0000 mg | ORAL_TABLET | Freq: Four times a day (QID) | ORAL | Status: DC | PRN
  Administered 2017-02-04 – 2017-02-06 (×5): 650 mg via ORAL
  Filled 2017-02-03 (×5): qty 2

## 2017-02-03 MED ORDER — CHLORTHALIDONE 25 MG PO TABS
25.0000 mg | ORAL_TABLET | Freq: Every day | ORAL | Status: DC
  Administered 2017-02-04 – 2017-02-06 (×3): 25 mg via ORAL
  Filled 2017-02-03 (×3): qty 1

## 2017-02-03 MED ORDER — ACETAMINOPHEN 650 MG RE SUPP: 650.0000 mg | Freq: Four times a day (QID) | RECTAL | Status: DC | PRN

## 2017-02-03 MED ORDER — ENOXAPARIN SODIUM 40 MG/0.4ML ~~LOC~~ SOLN
40.0000 mg | SUBCUTANEOUS | Status: DC
  Administered 2017-02-04 – 2017-02-05 (×3): 40 mg via SUBCUTANEOUS
  Filled 2017-02-03 (×3): qty 0.4

## 2017-02-03 MED ORDER — VANCOMYCIN HCL 10 G IV SOLR
2500.0000 mg | Freq: Once | INTRAVENOUS | Status: AC
  Administered 2017-02-04: 2500 mg via INTRAVENOUS
  Filled 2017-02-03: qty 2500

## 2017-02-03 MED ORDER — KETOROLAC TROMETHAMINE 60 MG/2ML IM SOLN
60.0000 mg | Freq: Once | INTRAMUSCULAR | Status: AC
  Administered 2017-02-03: 60 mg via INTRAMUSCULAR

## 2017-02-03 MED ORDER — SODIUM CHLORIDE 0.9 % IV SOLN
INTRAVENOUS | Status: DC
  Administered 2017-02-04 – 2017-02-05 (×2): via INTRAVENOUS

## 2017-02-03 MED ORDER — VANCOMYCIN HCL 10 G IV SOLR
1250.0000 mg | Freq: Three times a day (TID) | INTRAVENOUS | Status: DC
  Administered 2017-02-04 – 2017-02-05 (×4): 1250 mg via INTRAVENOUS
  Filled 2017-02-03 (×5): qty 1250

## 2017-02-03 NOTE — Progress Notes (Signed)
Pharmacy Antibiotic Note  Stacy Campos is a 46 y.o. female admitted on 02/03/2017 with cellulitis/hydradenitis.  Pharmacy has been consulted for vancomycin dosing.  Of note pt has been on doxycycline 3 times, most recent started 7/9.  Plan: Vancomycin 2500mg  x1 then 1250mg  IV every 8 hours.  Goal trough 10-15 mcg/mL.   Temp (24hrs), Avg:98 F (36.7 C), Min:97.7 F (36.5 C), Max:98.2 F (36.8 C)   Allergies  Allergen Reactions  . Dimetapp Stacy Campos] Hives     Thank you for allowing pharmacy to be a part of this patient's care.  Wynona Neat, PharmD, BCPS  02/03/2017 11:50 PM

## 2017-02-03 NOTE — Addendum Note (Signed)
Addended by: Darci Needle on: 02/03/2017 09:50 PM   Modules accepted: Orders

## 2017-02-03 NOTE — H&P (Signed)
[facility] Admission History and Physical Service Pager: [phone]  Patient name: Stacy Campos Medical record number: [id number] Date of birth: 10-08-1970 Age: 46 y.o. Gender: female  Primary Care Provider: Caroline More, DO Consultants: none Code Status: FULL  Chief Complaint: pain in L axilla and breast  Assessment and Plan: Stacy Campos is a 46 y.o. female presenting with severe pain in L axilla and L breast. PMH is significant for hydradenitis, HTN, carpal tunnel syndrome.   Left axillary and lateral breast pain: likely acute flare of hydradenitis. Patient reports this is a new diagnosis for her over the past year, but she has had frequent flares and episodes since then. She reports this is her third course of doxycycline as an outpatient, and this causes brief relief in her symptoms and decrease in boils. Family history of boils. Suspects either severe hidradenitis flare or possible underlying abscess. Patient is obese and exquisitely tender to palpation, therefore exam for fluctuance is limited. Given severity of pain and concern for abscess with possible MRSA due to skin source, will escalate antibiotics to vancomycin. -Admit to MedSurg, Dr. Ardelia Mems attending -Discussed imaging with radiology, who recommended CT chest with contrast with notes to include left axilla and left lateral breast -Stat BMP, CBC, screening HIV -Vancomycin per pharmacy -nothing by mouth after midnight in case abscesses found to needs intervention -Tylenol and oxycodone 5 mg IR every 6 when necessary for pain  HTN: home meds are chlorthalidone and losartan. She took both of these today. She reports that her systolic blood pressure at home normally runs 170s. Pain can also play a role in her elevated blood pressure today. -hold cozaar in the setting of dye load, vancomycin -restart Cozaar as kidney function tolerates -Continue chlorthalidone  FEN/GI: NPO after MN Prophylaxis:  lovenox   Disposition: admit to inpatient  History of Present Illness:  Stacy Campos is a 46 y.o. female presenting with severe pain under her left axilla and over left lateral breast. She has a history of hidradenitis 1 year, with a family history of the same. She notes that she has had many outbreaks of boils this year, several treated with doxycycline successfully. Started on this course of doxycycline on 7/9. However, the pain has continued to become worse. She notes that normally these boils was opened spontaneously drain, and she has noticed one red area under her left axilla which has drained. She is tearful and frustrated with this chronic condition.  Directly admitted from clinic due to concerns for underlying abscess and need for imaging as well as IV antibiotics.  Review Of Systems: Per HPI with the following additions: denies chest pain, shortness breath, abdominal pain, constipation, changes in urination, new weakness.  ROS  Patient Active Problem List   Diagnosis Date Noted  . Left axillary pain 02/03/2017  . Atypical chest pain 09/23/2016  . Left knee pain 06/23/2016  . Blurry vision 06/08/2016  . Carpal tunnel syndrome 05/17/2016  . Hydradenitis 12/07/2015  . HTN (hypertension) 01/07/2010    Past Medical History: Past Medical History:  Diagnosis Date  . Arthritis    "knees, back" (02/03/2017)  . Chronic lower back pain   . Daily headache    "related to my BP" (02/03/2017)  . Elevated blood pressure reading without diagnosis of hypertension   . Hidradenitis    "chronic; been ongoing for > 1 yr" (02/03/2017)  . Hypertension   . Scoliosis     Past Surgical History: Past Surgical History:  Procedure Laterality  Date  . DILATION AND CURETTAGE OF UTERUS    . KNEE ARTHROSCOPY Bilateral 1992    Social History: Social History  Substance Use Topics  . Smoking status: Former Smoker    Years: 2.00    Types: Cigarettes    Quit date: 36  . Smokeless tobacco: Never  Used  . Alcohol use 0.0 oz/week     Comment: 02/03/2017 "I'll drink on big events; maybe 3 times/year"   Additional social history:  none Please also refer to relevant sections of EMR.  Family History: Family History  Problem Relation Age of Onset  . Arthritis Mother   . Rheum arthritis Mother   . Lung disease Mother        interstitial 2/2 RA, rheumatic heart disease  . Stroke Father   . Hypertension Father   . Diabetes Father   . Heart disease Father   . Hyperlipidemia Father   . Stroke Maternal Grandmother   . Diabetes Maternal Grandmother   . Heart disease Maternal Grandfather   . Diabetes Paternal Grandmother   . Diabetes Paternal Grandfather   . Heart disease Paternal Grandfather   . Cancer Sister        UTERINE- SPREAD TO LUNGS/SPLEEN/LIVER  . Breast cancer Paternal Aunt     Allergies and Medications: Allergies  Allergen Reactions  . Dimetapp [[employer]bertsons Di Bromm] Hives   No current facility-administered medications on file prior to encounter.    Current Outpatient Prescriptions on File Prior to Encounter  Medication Sig Dispense Refill  . acetaminophen (TYLENOL) 500 MG tablet Take 1,500 mg by mouth every 6 (six) hours as needed for mild pain.     . chlorthalidone (HYGROTON) 25 MG tablet Take 1 tablet (25 mg total) by mouth daily. 90 tablet 3  . doxycycline (VIBRAMYCIN) 100 MG capsule Take 1 capsule (100 mg total) by mouth 2 (two) times daily. 20 capsule 0  . losartan (COZAAR) 50 MG tablet Take 1 tablet (50 mg total) by mouth daily. 90 tablet 3  . Multiple Vitamin (MULTIVITAMIN) capsule Take 1 capsule by mouth daily.      Objective: BP (!) 147/95 (BP Location: Right Arm)   Pulse 82   Temp 97.7 F (36.5 C) (Oral)   Resp 20   SpO2 98%  Exam: General: obese female lying in bed in NAD Eyes: EOMI, PEERLA ENTM: MMM, normal oropharynx Neck: supple, no LAD Cardiovascular: RRR, no murmur Respiratory: CTAB, easy WOB Gastrointestinal: SNTND, +BS MSK:  spontaneously moves all extremities Derm: hyperpigmented, indurated, warm series of swellings under left axilla and right axilla. One spontaneously draining serous fluid in the center of left axilla. Exquisitely TTP over left axilla and left lateral breast. TTP over right axilla as well. Neuro: AOx4, CN II-XII grossly intact Psych: mood and affect appropriate  Labs and Imaging: CBC BMET   Recent Labs Lab 02/03/17 2342  WBC 9.1  HGB 11.5*  HCT 35.5*  PLT 273    Recent Labs Lab 02/03/17 2342  NA 139  K 3.3*  CL 105  CO2 25  BUN 11  CREATININE 0.77  GLUCOSE 104*  CALCIUM 8.4Sela Hilding, MD 02/04/2017, 12:54 AM PGY-2, [facility] pager: [phone], text pages welcome

## 2017-02-03 NOTE — Patient Instructions (Signed)
Ms. Krizan,  They have a bed available in 6N.  Please come to the hospital after you've dropped your children at home.  Best, Dr. Ola Spurr

## 2017-02-03 NOTE — Progress Notes (Signed)
[facility] Progress Note  Subjective:  Stacy Campos is a 46 y.o. female with history of HTN and hidradenitis suppurativa who presents for follow-up of hydradenitis flare.  #Pain of L Axilla: - Seen at [facility] 01/30/17 and thought to have hydradenitis flare. Was prescribed doxycycline 100 mg BID x 10 days. Denies any trouble taking this medication. Suspicion for possible abscess but no drain-able area of fluctuance at that time. One area of drainage that continues to drain today.  - Current symptoms began about 3 weeks ago, with area of redness under left breast that has since resolved.  - Pain remains severe. Has not improved at all. Somewhat relieved with warm compresses and aleve. Tylenol does not help. Hurts to move her L arm in certain positions. Pain tracks into lateral aspect of L breast. - Thinks stress may be contributing to current flare. Her brother recently died suddenly from cardiac arrest.  - Reports having had a normal mammogram in March -- no masses seen at that time.  - Issues with boils began about 1 year ago. Had not had issues previously. Maternal grandmother had issues with boils per patient. Patient has not smoked in over 30 years. She has stopped using deodorant in case that was causing flares. She wears lose fitting clothing. She thinks this is her 3rd course of doxycycline. She says she has also tried a gel and hibiclens. She has had boils in bilateral axillae but currently only the left side is bothering her. Has been trying to lose weight and has met with nutritionist Dr. Jenne Campus and just started work-out classes 2 weeks ago.  ROS: denies n/v, fevers/chills   Social History   Social History  . Marital status: Legally Separated    Spouse name: N/A  . Number of children: 3  . Years of education: N/A   Occupational History  . [employer]    Allergies  Allergen Reactions  . Dimetapp [name] Hives    Objective: Blood  pressure (!) 144/90, pulse 91, temperature 98.2 F (36.8 C), temperature source Oral, weight 295 lb (133.8 kg). Constitutional: Uncomfortable-appearing, morbidly obese female HENT: MMM Cardiovascular: RRR, S1, S2, no m/r/g.  Pulmonary/Chest: Effort normal and breath sounds normal. No respiratory distress.  Skin: Three ~1 cm papules of L axillae that are exquisitely tender. One area open with some serous drainage. TTP of lateral of aspect of breast into inframammary fold. Tissue below breasts looks and feels symmetric with bilateral hyperpigmentation. Difficulty tolerating exam and winces with moderate amount of pressure applied.  Psychiatric: Normal mood and affect.  Vitals reviewed  Assessment/Plan: Hydradenitis - Degree of pain and extent of area that is painful concerning for possible abscess, though no fluctuance felt on exam (may be deeper within tissue) - Precepted with Drs. Nori Riis and Sterrett, who recommended admission for IV antibiotics (vancomycin vs PO step-up by adding augmentin), pain control, and further imaging with CT of L axilla with contrast. If abscess found, would consult surgery.  - 60 mg of IV toradol given prior to leaving clinic - Bed request ordered. Patient to present to 6N after she drops her children off at home.  Olene Floss, MD [place], PGY-3

## 2017-02-03 NOTE — Assessment & Plan Note (Signed)
-   Degree of pain and extent of area that is painful concerning for possible abscess, though no fluctuance felt on exam (may be deeper within tissue) - Precepted with Drs. Nori Riis and Rosa Sanchez, who recommended admission for IV antibiotics (vancomycin vs PO step-up by adding augmentin), pain control, and further imaging with CT of L axilla with contrast. If abscess found, would consult surgery.  - 60 mg of IV toradol given prior to leaving clinic - Bed request ordered. Patient to present to 6N after she drops her children off at home.

## 2017-02-04 ENCOUNTER — Encounter (HOSPITAL_COMMUNITY): Payer: Self-pay | Admitting: Radiology

## 2017-02-04 ENCOUNTER — Inpatient Hospital Stay (HOSPITAL_COMMUNITY): Payer: Medicaid Other

## 2017-02-04 DIAGNOSIS — L732 Hidradenitis suppurativa: Principal | ICD-10-CM

## 2017-02-04 DIAGNOSIS — N644 Mastodynia: Secondary | ICD-10-CM

## 2017-02-04 DIAGNOSIS — I10 Essential (primary) hypertension: Secondary | ICD-10-CM

## 2017-02-04 DIAGNOSIS — M79622 Pain in left upper arm: Secondary | ICD-10-CM

## 2017-02-04 LAB — BASIC METABOLIC PANEL
Anion gap: 9 (ref 5–15)
BUN: 11 mg/dL (ref 6–20)
CHLORIDE: 105 mmol/L (ref 101–111)
CO2: 25 mmol/L (ref 22–32)
CREATININE: 0.77 mg/dL (ref 0.44–1.00)
Calcium: 8.4 mg/dL — ABNORMAL LOW (ref 8.9–10.3)
GFR calc non Af Amer: >60 mL/min (ref >60)
Glucose, Bld: 104 mg/dL — ABNORMAL HIGH (ref 65–99)
POTASSIUM: 3.3 mmol/L — AB (ref 3.5–5.1)
Sodium: 139 mmol/L (ref 135–145)

## 2017-02-04 LAB — CBC
HEMATOCRIT: 35.5 % — AB (ref 36.0–46.0)
Hemoglobin: 11.5 g/dL — ABNORMAL LOW (ref 12.0–15.0)
MCH: 26.9 pg (ref 26.0–34.0)
MCHC: 32.4 g/dL (ref 30.0–36.0)
MCV: 83.1 fL (ref 78.0–100.0)
PLATELETS: 273 10*3/uL (ref 150–400)
RBC: 4.27 MIL/uL (ref 3.87–5.11)
RDW: 13.6 % (ref 11.5–15.5)
WBC: 9.1 10*3/uL (ref 4.0–10.5)

## 2017-02-04 LAB — HIV ANTIBODY (ROUTINE TESTING W REFLEX): HIV Screen 4th Generation wRfx: NONREACTIVE

## 2017-02-04 IMAGING — CT CT CHEST W/ CM
2 of 6 series · 13 of 36 positions shown, 16 images · IV contrast (agent unspecified)
Comparison: Chest radiograph dated [DATE].

ADDENDUM:
The left breast is reportedly red and tender. Larger field of view
imaging was reviewed to assess the breast and chest wall. The
breasts are fatty replaced. There are no breast masses or areas of
inflammation. No evidence of an abscess.
CLINICAL DATA: Concern for hydradenitis and underlying abcess of
the L axillae and L Lateral breast.

EXAM:
CT CHEST WITH CONTRAST
TECHNIQUE: Multidetector CT imaging of the chest was performed during
intravenous contrast administration.
CONTRAST:  75 mL of [01] intravenous contrast

[Series 10: chest with 2mm st · axial · 0.98mm/px · z∈[+1329,+1549]mm · 12 of 132 slices shown, 15 images]
[im 11/132  mediastinal]
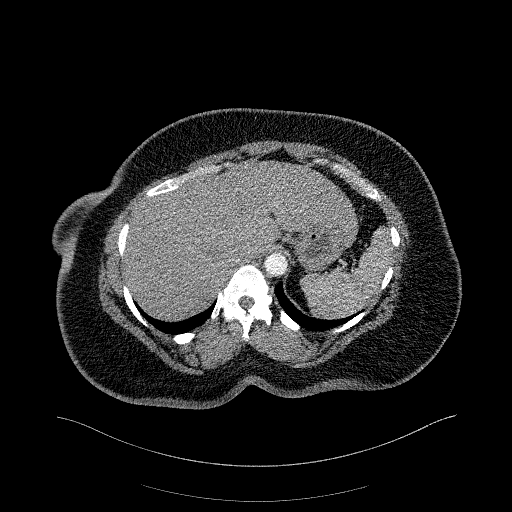
[im 11/132  lung]
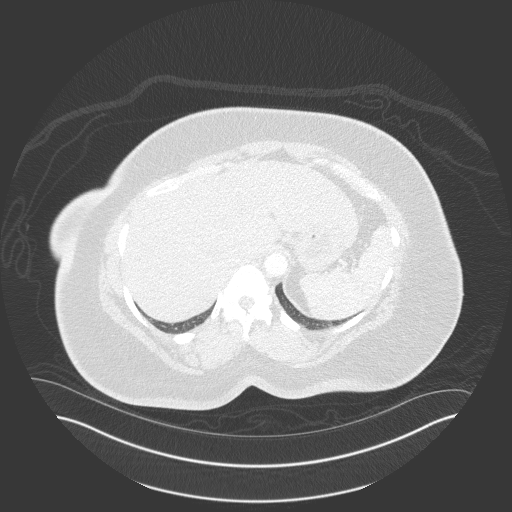
[im 21/132  lung]
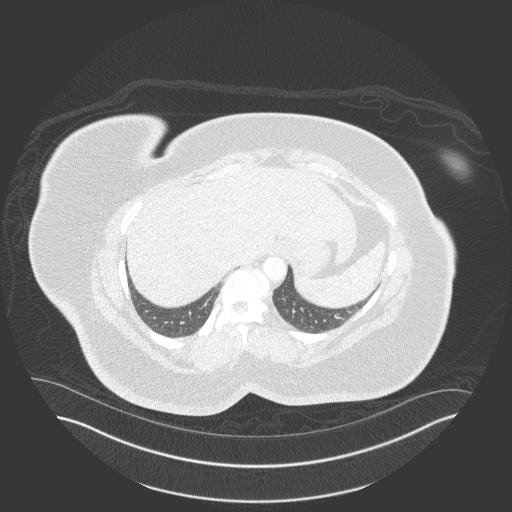
[im 31/132  lung]
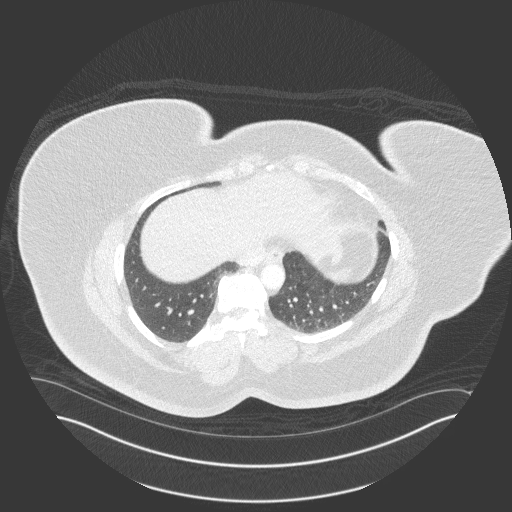
[im 41/132  lung]
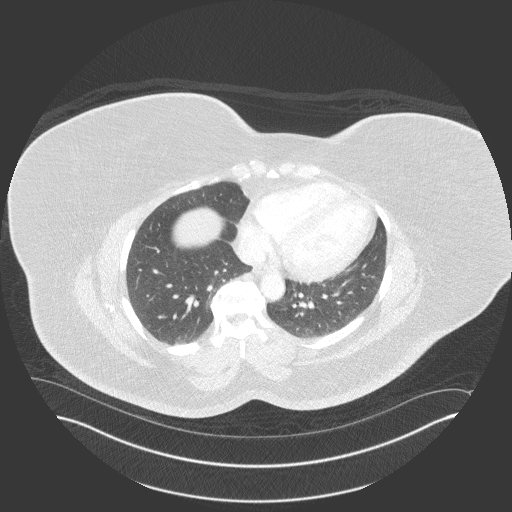
[im 51/132  mediastinal]
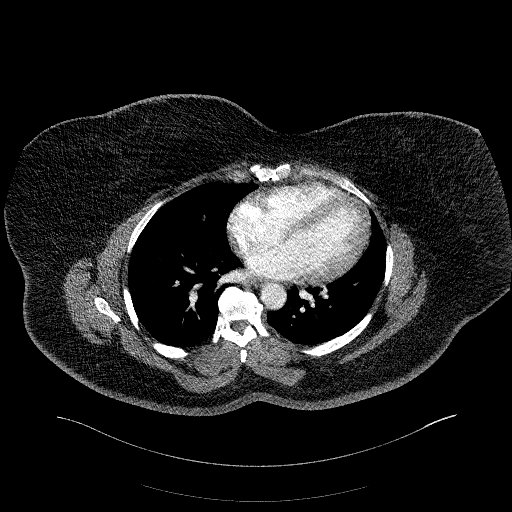
[im 51/132  lung]
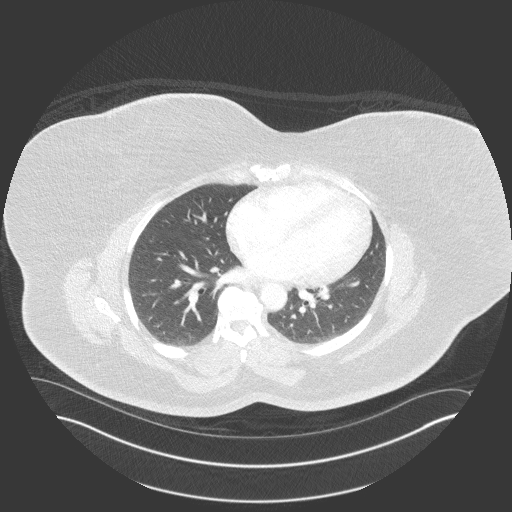
[im 61/132  lung]
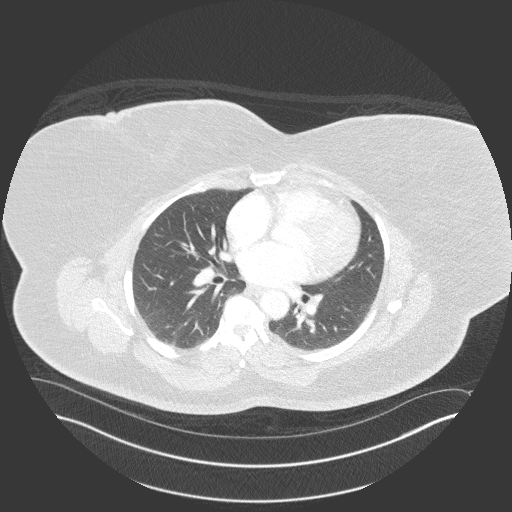
[im 71/132  lung]
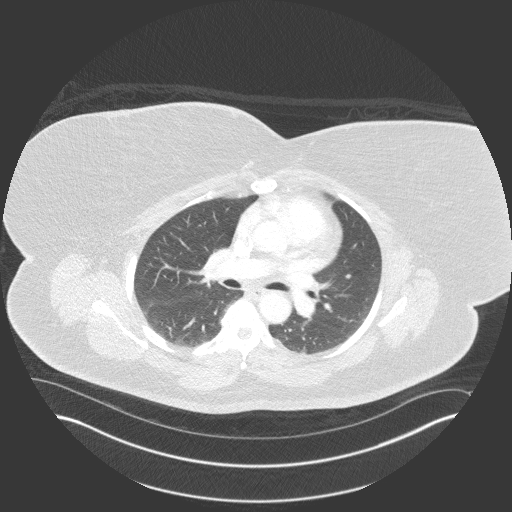
[im 81/132  lung]
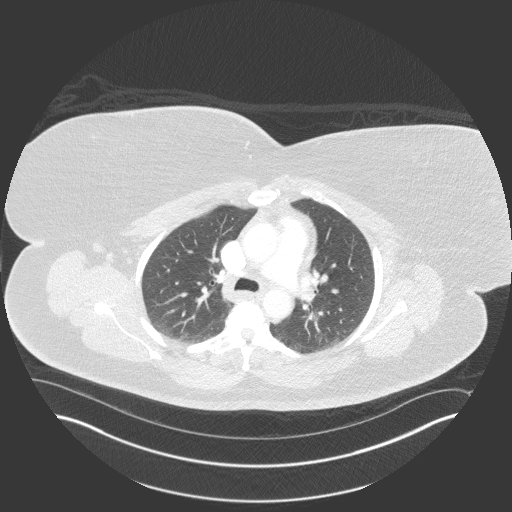
[im 91/132  mediastinal]
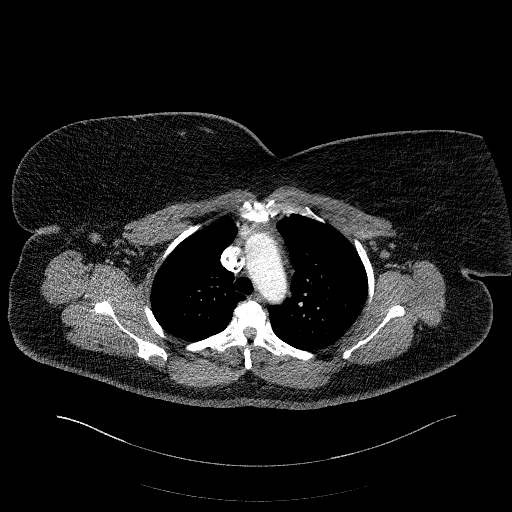
[im 91/132  lung]
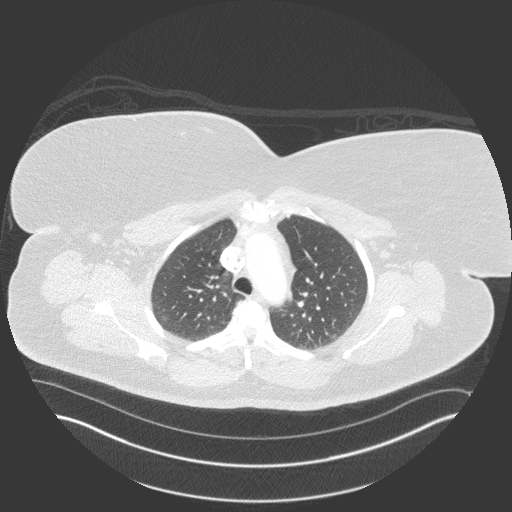
[im 101/132  lung]
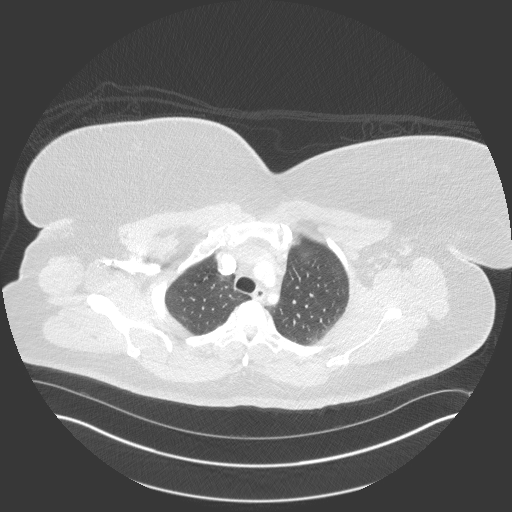
[im 111/132  lung]
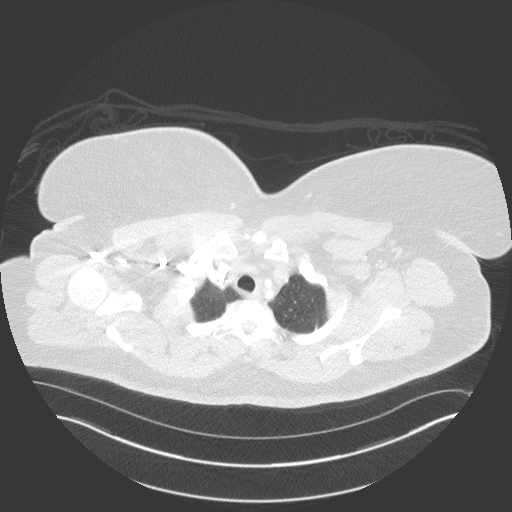
[im 121/132  lung]
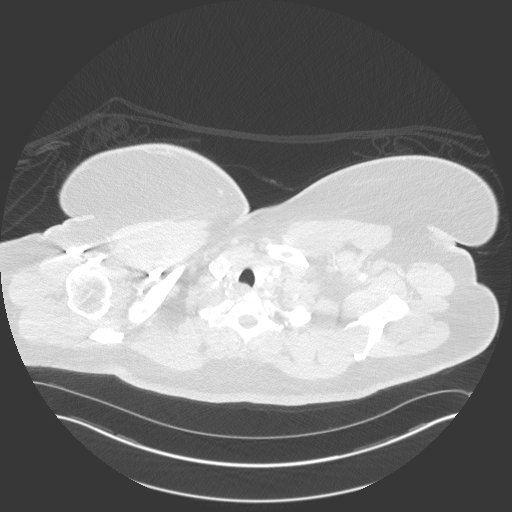

[Series 602: cor ectended fov · coronal · 1.22mm/px · 1 of 148 slices shown]
[im 74/148  lung]
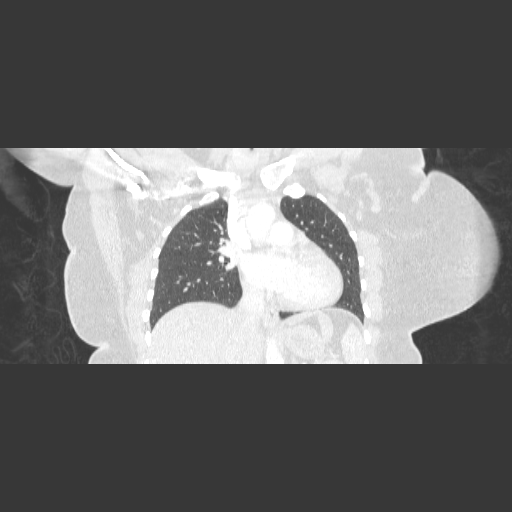

[13 of 36 positions shown; findings below may reference images not displayed]

FINDINGS: Cardiovascular: Heart is normal in size and configuration. No
significant coronary artery calcifications. Great vessels normal in
caliber. No aortic dissection or atherosclerosis.

Mediastinum/Nodes: No neck base masses. There is a mildly enlarged
right axillary lymph node measuring 11 mm in short axis. No soft
tissue abscess or inflammation is seen along the visualized axilla.

No mediastinal or hilar masses or enlarged lymph nodes. Trachea and
esophagus are unremarkable.

Lungs/Pleura: Lungs are clear. No pleural effusion or pneumothorax.

Upper Abdomen: No acute abnormality.

Musculoskeletal: No fracture or acute finding. No osteoblastic or
osteolytic lesions. Scoliosis and degenerative endplate spurring
along the thoracic spine.
IMPRESSION: 1. No soft tissue abscess or significant inflammation evident along
the visualized axilla. Mildly enlarged right axillary lymph node,
presumed reactive.
2. Heart, mediastinum and hila are unremarkable.  Lungs are clear.

## 2017-02-04 MED ORDER — OXYCODONE HCL 5 MG PO TABS
5.0000 mg | ORAL_TABLET | Freq: Four times a day (QID) | ORAL | Status: DC | PRN
  Administered 2017-02-04 (×2): 5 mg via ORAL
  Filled 2017-02-04 (×2): qty 1

## 2017-02-04 MED ORDER — IOPAMIDOL (ISOVUE-M 300) INJECTION 61%
15.0000 mL | Freq: Once | INTRAMUSCULAR | Status: AC | PRN
  Administered 2017-02-04: 75 mL via INTRATHECAL

## 2017-02-04 MED ORDER — TRAMADOL HCL 50 MG PO TABS: 50.0000 mg | ORAL_TABLET | Freq: Two times a day (BID) | ORAL | Status: DC | PRN

## 2017-02-04 MED ORDER — OXYCODONE HCL 5 MG PO TABS
2.5000 mg | ORAL_TABLET | Freq: Four times a day (QID) | ORAL | Status: DC | PRN
  Administered 2017-02-04: 2.5 mg via ORAL
  Filled 2017-02-04: qty 1

## 2017-02-04 MED ORDER — OXYCODONE HCL 5 MG PO TABS
2.5000 mg | ORAL_TABLET | Freq: Three times a day (TID) | ORAL | Status: DC | PRN
  Administered 2017-02-04: 2.5 mg via ORAL
  Filled 2017-02-04: qty 1

## 2017-02-04 NOTE — Discharge Summary (Signed)
[facility] Discharge Summary  Patient name: Stacy Campos Medical record number: [id number] Date of birth: 07/27/1970 Age: 46 y.o. Gender: female Date of Admission: 02/03/2017  Date of Discharge: 02/06/17 Admitting Physician: Baird Kay, MD  Primary Care Provider: Caroline More, DO Consultants: General surgery  Indication for Hospitalization: Possible abscess formation or infection of hidradenitis   Discharge Diagnoses/Problem List:   Hidradenitis suppurativa Hypertension  Disposition: Home  Discharge Condition: stable, improving  Discharge Exam: Please see exam from progress note on day of discharge  Brief Hospital Course:  Ms. Schlauch was admitted on 02/03/17 to rule out an abscess formation as a result of her hidradenitis.  The patient has tried doxycycline 3 times as an outpatient, and each time, her symptoms only improve temporarily.  At her clinic visit on 02/03/17, she was exquisitely tender to palpation along her left axilla and left lateral breast, where she has a current flair of her hidradenitis.  Therefore, there was a concern that she had an abscess or other infection in that area.  She was started on vancomycin upon admission, and received a CT on 02/04/17.  The CT showed no evidence for an abscess in that area.  Surgery was consulted on 02/04/17 and did not think she needed surgical intervention.  They agreed with continuing the IV vancomycin and suggested clindamycin as a PO option.  Issues for Follow Up:  1. Patient needs to complete clindamycin course for current hidradenitis flair 2. She should follow up with her PCP regarding her blood pressure and hidradenitis maintenance  Significant Procedures: none  Significant Labs and Imaging:   Recent Labs Lab 02/03/17 2342 02/05/17 0603  WBC 9.1 7.9  HGB 11.5* 11.7*  HCT 35.5* 36.3  PLT 273 255    Recent Labs Lab 02/03/17 2342 02/05/17 0603  NA 139 136  K 3.3* 3.8  CL  105 106  CO2 25 23  GLUCOSE 104* 105*  BUN 11 9  CREATININE 0.77 0.70  CALCIUM 8.4* 8.5*    Ct Chest W Contrast  Addendum Date: 02/04/2017   ADDENDUM REPORT: 02/04/2017 11:41 ADDENDUM: The left breast is reportedly red and tender. Larger field of view imaging was reviewed to assess the breast and chest wall. The breasts are fatty replaced. There are no breast masses or areas of inflammation. No evidence of an abscess. Electronically Signed   By: Lajean Manes M.D.   On: 02/04/2017 11:41   Result Date: 02/04/2017 CLINICAL DATA:  Concern for hydradenitis and underlying abcess of the L axillae and L Lateral breast. EXAM: CT CHEST WITH CONTRAST TECHNIQUE: Multidetector CT imaging of the chest was performed during intravenous contrast administration. CONTRAST:  75 mL of Isovue-300 intravenous contrast COMPARISON:  Chest radiograph dated 10/12/2011. FINDINGS: Cardiovascular: Heart is normal in size and configuration. No significant coronary artery calcifications. Great vessels normal in caliber. No aortic dissection or atherosclerosis. Mediastinum/Nodes: No neck base masses. There is a mildly enlarged right axillary lymph node measuring 11 mm in short axis. No soft tissue abscess or inflammation is seen along the visualized axilla. No mediastinal or hilar masses or enlarged lymph nodes. Trachea and esophagus are unremarkable. Lungs/Pleura: Lungs are clear. No pleural effusion or pneumothorax. Upper Abdomen: No acute abnormality. Musculoskeletal: No fracture or acute finding. No osteoblastic or osteolytic lesions. Scoliosis and degenerative endplate spurring along the thoracic spine. IMPRESSION: 1. No soft tissue abscess or significant inflammation evident along the visualized axilla. Mildly enlarged right axillary lymph node, presumed reactive. 2. Heart,  mediastinum and hila are unremarkable.  Lungs are clear. Electronically Signed: By: Lajean Manes M.D. On: 02/04/2017 09:12     Results/Tests Pending at  Time of Discharge: none  Discharge Medications:  Allergies as of 02/06/2017      Reactions   Dimetapp [name] Hives      Medication List    STOP taking these medications   doxycycline 100 MG capsule Commonly known as:  VIBRAMYCIN     TAKE these medications   acetaminophen 500 MG tablet Commonly known as:  TYLENOL Take 1,500 mg by mouth daily as needed (pain).   ALEVE 220 MG tablet Generic drug:  naproxen sodium Take 220 mg by mouth every 12 (twelve) hours.   chlorthalidone 25 MG tablet Commonly known as:  HYGROTON Take 1 tablet (25 mg total) by mouth daily. What changed:  when to take this   clindamycin 150 MG capsule Commonly known as:  CLEOCIN Take 3 capsules (450 mg total) by mouth every 6 (six) hours.   losartan 50 MG tablet Commonly known as:  COZAAR Take 1 tablet (50 mg total) by mouth daily.   multivitamin with minerals Tabs tablet Take 1 tablet by mouth daily.       Discharge Instructions: Please refer to Patient Instructions section of EMR for full details.  Patient was counseled on important signs and symptoms that should prompt return to medical care, changes in medications, dietary instructions, activity restrictions, and follow up appointments.   Follow-Up Appointments: Follow-up Information    Rogue Bussing, MD. Go on 02/09/2017.   Specialty:  Family Medicine Why:  Appointment at 3.30 with Dr. Ola Spurr.  Please arrive 15 minutes early.  Contact information: [place] [address] [phone]           Kathrene Alu, MD 02/07/2017, 1:07 PM PGY-1, [place]

## 2017-02-04 NOTE — Progress Notes (Signed)
Family Medicine Teaching Service Daily Progress Note Intern Pager: [phone]  Patient name: Stacy Campos Medical record number: [id number] Date of birth: November 08, 1970 Age: 46 y.o. Gender: female  Primary Care Provider: Caroline More, DO Consultants: none Code Status: FULL  Pt Overview and Major Events to Date:  Stacy Campos is a 46 y.o. Female who was admitted from [facility] clinic due to an acute flare of hydradenitis, resulting in severe pain in her left axilla extending to the lateral side of her left breast.  She has been given three courses of doxycycline outpatient, which has resulted in a temporary decrease in symptoms.  Due to patient's exquisite tenderness to palpation in her left axilla and lateral left breast, she was admitted in order to rule out abscess formation. Patient was admitted to [facility] on 02/04/17.   Assessment and Plan: Stacy Campos is a 46 y.o. Female who was admitted from [facility] clinic due to an acute flare of hydradenitis, resulting in severe pain in her left axilla extending to the lateral side of her left breast. PMH significant for HTN, hidradenitis, carpel tunnel syndrome, and atypical chest pain.   Left axillary and lateral breast pain:  Likely acute flare of hydradenitis with possible abscess formation. The patient also reports pain and is tender to palpation on her right axilla. Patient reports this is a new diagnosis for her over the past year, but she has had frequent flares and episodes since then. She reports this is her third course of doxycycline as an outpatient, and this causes brief relief in her symptoms and decrease in boils. Family history of boils. Suspects either severe hidradenitis flare or possible underlying abscess. Patient is obese and exquisitely tender to palpation, therefore exam for fluctuance was limited. Given severity of pain and concern for abscess with possible MRSA due to skin source, will escalate antibiotics to vancomycin.  BMP and CBC on 02/03/17 are  wnl. CT chest with contrast on 02/04/17 showed no soft tissue abscess or significant inflammation along the axilla. Patient today notes decreased pain, but was tender on palpation. Patient denies fever or chills. Patient states knot in Left breast began one month ago and has increased in severity since then. HIV negative. -vancomycin d/c'd at 1030 on 7/15 -clindamycin 450 mg QID ordered on 7/15, dose checked with pharmacy -Tylenol 650 mg q6h prn -Increased frequency of oxycodone 2.5 mg from q8h to q6h prn due to patient reports of pain, last dose patient asked for was at 2200 on 7/14.  Received tylenol this am.   HTN:  home meds are chlorthalidone and losartan.  She reports that her systolic blood pressure at home normally runs 170s. Blood pressures improved on 7/15, highest reading was 158/99, currently 122/70 -restarted cozaar d/t good kidney function on 7/15 -Continue chlorthalidone  FEN/GI: regular diet  Prophylaxis: lovenox    Disposition: home  Subjective:   Objective: Temp:  [98.6 F (37 C)] 98.6 F (37 C) (07/14 1354) Pulse Rate:  [80-88] 80 (07/14 1354) Resp:  [18] 18 (07/14 1354) BP: (143-158)/(89-99) 158/99 (07/14 1354) SpO2:  [98 %-99 %] 98 % (07/14 1354) Physical Exam: General: obese, sitting up, pleasant and talkative Cardiovascular: RRR, no MRG Respiratory: CTAB Abdomen: soft, non tender, non distended, bowel sounds heard in all 4 quadrants  Extremities: normal range of motion, no edema  Skin: Left axilla covered with bandage d/t recent spontaneous drainage, right axilla shows bumps, none open. Moderately tender to palpation in bilateral axillae and lateral breasts, L worse than R.  Laboratory:  Recent Labs Lab 02/03/17 2342 02/05/17 0603  WBC 9.1 7.9  HGB 11.5* 11.7*  HCT 35.5* 36.3  PLT 273 255    Recent Labs Lab 02/03/17 2342 02/05/17 0603  NA 139 136  K 3.3* 3.8  CL 105 106  CO2 25 23  BUN 11 9  CREATININE 0.77 0.70  CALCIUM 8.4* 8.5*   GLUCOSE 104* 105*    Imaging/Diagnostic Tests: Ct Chest W Contrast  Addendum Date: 02/04/2017   ADDENDUM REPORT: 02/04/2017 11:41 ADDENDUM: The left breast is reportedly red and tender. Larger field of view imaging was reviewed to assess the breast and chest wall. The breasts are fatty replaced. There are no breast masses or areas of inflammation. No evidence of an abscess. Electronically Signed   By: Lajean Manes M.D.   On: 02/04/2017 11:41   Result Date: 02/04/2017 CLINICAL DATA:  Concern for hydradenitis and underlying abcess of the L axillae and L Lateral breast. EXAM: CT CHEST WITH CONTRAST TECHNIQUE: Multidetector CT imaging of the chest was performed during intravenous contrast administration. CONTRAST:  75 mL of Isovue-300 intravenous contrast COMPARISON:  Chest radiograph dated 10/12/2011. FINDINGS: Cardiovascular: Heart is normal in size and configuration. No significant coronary artery calcifications. Great vessels normal in caliber. No aortic dissection or atherosclerosis. Mediastinum/Nodes: No neck base masses. There is a mildly enlarged right axillary lymph node measuring 11 mm in short axis. No soft tissue abscess or inflammation is seen along the visualized axilla. No mediastinal or hilar masses or enlarged lymph nodes. Trachea and esophagus are unremarkable. Lungs/Pleura: Lungs are clear. No pleural effusion or pneumothorax. Upper Abdomen: No acute abnormality. Musculoskeletal: No fracture or acute finding. No osteoblastic or osteolytic lesions. Scoliosis and degenerative endplate spurring along the thoracic spine. IMPRESSION: 1. No soft tissue abscess or significant inflammation evident along the visualized axilla. Mildly enlarged right axillary lymph node, presumed reactive. 2. Heart, mediastinum and hila are unremarkable.  Lungs are clear. Electronically Signed: By: Lajean Manes M.D. On: 02/04/2017 09:12     Kathrene Alu, MD 02/04/2017, 11:34 PM PGY-1, [place] Intern pager: [phone], text pages welcome

## 2017-02-04 NOTE — Consult Note (Signed)
Reason for Consult hidradenitis left axilla Referring Physician: [name] is an 46 y.o. female.  HPI: Asked see patient at request of Dr. Ardelia Mems for left axillary hidradenitis. Patient developed left axilla discomfort. She had 1 small open wound. She was placed on doxycycline 9 days ago. She continued to have pain was admitted for IV vancomycin. CT scan chest was done which shows no evidence of abscess or inflammation. Patient was asleep. Difficult to arouse but once she woke up, she complained of left axillary pain and pain along the inferior aspect of her left breast. She has had other episodes but not this severe. There's been no open wound or draining sinus tract noted.  Past Medical History:  Diagnosis Date  . Arthritis    "knees, back" (02/03/2017)  . Chronic lower back pain   . Daily headache    "related to my BP" (02/03/2017)  . Elevated blood pressure reading without diagnosis of hypertension   . Hidradenitis    "chronic; been ongoing for > 1 yr" (02/03/2017)  . Hypertension   . Scoliosis     Past Surgical History:  Procedure Laterality Date  . DILATION AND CURETTAGE OF UTERUS    . KNEE ARTHROSCOPY Bilateral 1992    Family History  Problem Relation Age of Onset  . Arthritis Mother   . Rheum arthritis Mother   . Lung disease Mother        interstitial 2/2 RA, rheumatic heart disease  . Stroke Father   . Hypertension Father   . Diabetes Father   . Heart disease Father   . Hyperlipidemia Father   . Stroke Maternal Grandmother   . Diabetes Maternal Grandmother   . Heart disease Maternal Grandfather   . Diabetes Paternal Grandmother   . Diabetes Paternal Grandfather   . Heart disease Paternal Grandfather   . Cancer Sister        UTERINE- SPREAD TO LUNGS/SPLEEN/LIVER  . Breast cancer Paternal Aunt     Social History:  reports that she quit smoking about 28 years ago. Her smoking use included Cigarettes. She quit after 2.00 years of use. She has never  used smokeless tobacco. She reports that she drinks alcohol. She reports that she does not use drugs.  Allergies:  Allergies  Allergen Reactions  . Dimetapp [name] Hives    Medications: I have reviewed the patient's current medications.  Results for orders placed or performed during the hospital encounter of 02/03/17 (from the past 48 hour(s))  Basic metabolic panel     Status: Abnormal   Collection Time: 02/03/17 11:42 PM  Result Value Ref Range   Sodium 139 135 - 145 mmol/L   Potassium 3.3 (L) 3.5 - 5.1 mmol/L   Chloride 105 101 - 111 mmol/L   CO2 25 22 - 32 mmol/L   Glucose, Bld 104 (H) 65 - 99 mg/dL   BUN 11 6 - 20 mg/dL   Creatinine, Ser 0.77 0.44 - 1.00 mg/dL   Calcium 8.4 (L) 8.9 - 10.3 mg/dL   GFR calc non Af Amer >60 >60 mL/min   GFR calc Af Amer >60 >60 mL/min    Comment: (NOTE) The eGFR has been calculated using the CKD EPI equation. This calculation has not been validated in all clinical situations. eGFR's persistently <60 mL/min signify possible Chronic Kidney Disease.    Anion gap 9 5 - 15  CBC     Status: Abnormal   Collection Time: 02/03/17 11:42 PM  Result Value  Ref Range   WBC 9.1 4.0 - 10.5 K/uL   RBC 4.27 3.87 - 5.11 MIL/uL   Hemoglobin 11.5 (L) 12.0 - 15.0 g/dL   HCT 35.5 (L) 36.0 - 46.0 %   MCV 83.1 78.0 - 100.0 fL   MCH 26.9 26.0 - 34.0 pg   MCHC 32.4 30.0 - 36.0 g/dL   RDW 13.6 11.5 - 15.5 %   Platelets 273 150 - 400 K/uL    Ct Chest W Contrast  Addendum Date: 02/04/2017   ADDENDUM REPORT: 02/04/2017 11:41 ADDENDUM: The left breast is reportedly red and tender. Larger field of view imaging was reviewed to assess the breast and chest wall. The breasts are fatty replaced. There are no breast masses or areas of inflammation. No evidence of an abscess. Electronically Signed   By: Lajean Manes M.D.   On: 02/04/2017 11:41   Result Date: 02/04/2017 CLINICAL DATA:  Concern for hydradenitis and underlying abcess of the L axillae and L  Lateral breast. EXAM: CT CHEST WITH CONTRAST TECHNIQUE: Multidetector CT imaging of the chest was performed during intravenous contrast administration. CONTRAST:  75 mL of Isovue-300 intravenous contrast COMPARISON:  Chest radiograph dated 10/12/2011. FINDINGS: Cardiovascular: Heart is normal in size and configuration. No significant coronary artery calcifications. Great vessels normal in caliber. No aortic dissection or atherosclerosis. Mediastinum/Nodes: No neck base masses. There is a mildly enlarged right axillary lymph node measuring 11 mm in short axis. No soft tissue abscess or inflammation is seen along the visualized axilla. No mediastinal or hilar masses or enlarged lymph nodes. Trachea and esophagus are unremarkable. Lungs/Pleura: Lungs are clear. No pleural effusion or pneumothorax. Upper Abdomen: No acute abnormality. Musculoskeletal: No fracture or acute finding. No osteoblastic or osteolytic lesions. Scoliosis and degenerative endplate spurring along the thoracic spine. IMPRESSION: 1. No soft tissue abscess or significant inflammation evident along the visualized axilla. Mildly enlarged right axillary lymph node, presumed reactive. 2. Heart, mediastinum and hila are unremarkable.  Lungs are clear. Electronically Signed: By: Lajean Manes M.D. On: 02/04/2017 09:12    Review of Systems  Constitutional: Positive for chills and malaise/fatigue.  HENT: Negative for hearing loss and tinnitus.   Eyes: Negative for blurred vision and double vision.  Respiratory: Negative for cough and hemoptysis.   Cardiovascular: Negative for chest pain and palpitations.  Gastrointestinal: Negative for heartburn and nausea.  Genitourinary: Negative for dysuria and urgency.  Musculoskeletal: Positive for joint pain and myalgias.  Skin: Negative for itching and rash.  Neurological: Negative for dizziness and headaches.  Psychiatric/Behavioral: Negative for depression and suicidal ideas.   Blood pressure (!)  158/99, pulse 80, temperature 98.6 F (37 C), temperature source Oral, resp. rate 18, height 5' 7"  (1.702 m), weight 133.8 kg (295 lb), SpO2 98 %. Physical Exam  Constitutional: She appears well-developed and well-nourished.  HENT:  Head: Normocephalic and atraumatic.  Eyes: Pupils are equal, round, and reactive to light. EOM are normal.  Neck: Normal range of motion.  Respiratory:      Assessment/Plan: Hidradenitis left axilla  CT scan reviewed I see no signs of abscess or any significant inflammation. Her examination appears unremarkable but she has significant pain throughout this area. Continue IV antibiotics. I see no signs of aggressive infection at this point. No role for surgery at this point. May consider clindamycin as an alternative vancomycin. Will follow along to monitor for change in status  Keyle Doby A. 02/04/2017, 4:39 PM

## 2017-02-04 NOTE — Progress Notes (Signed)
Family Medicine Teaching Service Daily Progress Note Intern Pager: [phone]  Patient name: Stacy Campos Medical record number: [id number] Date of birth: 05/14/1971 Age: 46 y.o. Gender: female  Primary Care Provider: Caroline More, DO Consultants: none Code Status: FULL  Pt Overview and Major Events to Date:  Stacy Campos is a 46 y.o. Female who was admitted from [facility] clinic due to an acute flare of hydradenitis, resulting in severe pain in her left axilla extending to the lateral side of her left breast.  She has been given three courses of doxycycline outpatient, which has resulted in a temporary decrease in symptoms.  Due to patient's exquisite tenderness to palpation in her left axilla and lateral left breast, she was admitted in order to rule out abscess formation. Patient was admitted to [facility] on 02/04/17.   Assessment and Plan: Stacy Campos is a 46 y.o. Female who was admitted from [facility] clinic due to an acute flare of hydradenitis, resulting in severe pain in her left axilla extending to the lateral side of her left breast. PMH significant for HTN, hydradenitis, carpel tunnel syndrome, and atypical chest pain.   Left axillary and lateral breast pain:  Likely acute flare of hydradenitis with possible abscess formation. The patient also reports pain and is tender to palpation on her right axilla. Patient reports this is a new diagnosis for her over the past year, but she has had frequent flares and episodes since then. She reports this is her third course of doxycycline as an outpatient, and this causes brief relief in her symptoms and decrease in boils. Family history of boils. Suspects either severe hidradenitis flare or possible underlying abscess. Patient is obese and exquisitely tender to palpation, therefore exam for fluctuance was limited. Given severity of pain and concern for abscess with possible MRSA due to skin source, will escalate antibiotics to vancomycin.  BMP and CBC on 02/03/17 are  wnl. CT chest with contrast on 02/04/17 showed no soft tissue abscess or significant inflammation along the axilla. Patient today notes decreased pain, but was tender on palpation. Patient denies fever or chills. Patient states knot in Left breast began one month ago and has increased in severity since then.  -screening HIV -continue Vancomycin (day #1) -Tylenol 650 mg q6h prn -Decrease oxycodone to 2.5 mg q8h prn due to patient grogginess on exam   HTN:  home meds are chlorthalidone and losartan. She took both of these today. She reports that her systolic blood pressure at home normally runs 170s. Pain can also play a role in her elevated blood pressure today. -hold cozaar in the setting of dye load, vancomycin -restart Cozaar as kidney function tolerates -Continue chlorthalidone  FEN/GI: regular diet  Prophylaxis: lovenox    Disposition: home  Subjective:  Stacy Campos is a 46 y.o. Female who was admitted from [facility] clinic due to an acute flare of hydradenitis, resulting in severe pain in her left axilla extending to the lateral side of her left breast. PMH significant for HTN, hydradenitis, carpel tunnel syndrome, and atypical chest pain. Patient today was very groggy on exam, patient stated she in not used to pain medication and thinks this dose is too high. Patient denies current pain and states it is a a 0/10 in severity. Patient reports knot in L breast began 1 month ago and has increased in severity since. Patient denies fever, chills, nausea, vomiting, shortness of breath, and chest pain.   Objective: Temp:  [97.7 F (36.5 C)-98.6 F (37 C)] 98.6 F (  37 C) (07/14 1354) Pulse Rate:  [80-91] 80 (07/14 1354) Resp:  [18-20] 18 (07/14 1354) BP: (143-158)/(89-99) 158/99 (07/14 1354) SpO2:  [98 %-99 %] 98 % (07/14 1354) Weight:  [295 lb (133.8 kg)] 295 lb (133.8 kg) (07/13 2145) Physical Exam: General: obese, very groggy and lying in bed Cardiovascular: RRR, no MRG Respiratory:  CTAB, no increased work of breathing, no wheezes, rales, or rhonchi  Abdomen: soft, non tender, non distended, bowel sounds heard in all 4 quadrants  Extremities: normal range of motion, no edema  Skin: Multiple bumps with diameter of abut 1 cm in bilateral axillae, some are open and appear pink, others are under skin.  Exquisitely tender to palpation in bilateral axillae and lateral breasts, L worse than R. Unable to palpate fluctuance due to increased pain   Laboratory:  Recent Labs Lab 02/03/17 2342  WBC 9.1  HGB 11.5*  HCT 35.5*  PLT 273    Recent Labs Lab 02/03/17 2342  NA 139  K 3.3*  CL 105  CO2 25  BUN 11  CREATININE 0.77  CALCIUM 8.4*  GLUCOSE 104*    Imaging/Diagnostic Tests: Ct Chest W Contrast  Addendum Date: 02/04/2017   ADDENDUM REPORT: 02/04/2017 11:41 ADDENDUM: The left breast is reportedly red and tender. Larger field of view imaging was reviewed to assess the breast and chest wall. The breasts are fatty replaced. There are no breast masses or areas of inflammation. No evidence of an abscess. Electronically Signed   By: Lajean Manes M.D.   On: 02/04/2017 11:41   Result Date: 02/04/2017 CLINICAL DATA:  Concern for hydradenitis and underlying abcess of the L axillae and L Lateral breast. EXAM: CT CHEST WITH CONTRAST TECHNIQUE: Multidetector CT imaging of the chest was performed during intravenous contrast administration. CONTRAST:  75 mL of Isovue-300 intravenous contrast COMPARISON:  Chest radiograph dated 10/12/2011. FINDINGS: Cardiovascular: Heart is normal in size and configuration. No significant coronary artery calcifications. Great vessels normal in caliber. No aortic dissection or atherosclerosis. Mediastinum/Nodes: No neck base masses. There is a mildly enlarged right axillary lymph node measuring 11 mm in short axis. No soft tissue abscess or inflammation is seen along the visualized axilla. No mediastinal or hilar masses or enlarged lymph nodes. Trachea  and esophagus are unremarkable. Lungs/Pleura: Lungs are clear. No pleural effusion or pneumothorax. Upper Abdomen: No acute abnormality. Musculoskeletal: No fracture or acute finding. No osteoblastic or osteolytic lesions. Scoliosis and degenerative endplate spurring along the thoracic spine. IMPRESSION: 1. No soft tissue abscess or significant inflammation evident along the visualized axilla. Mildly enlarged right axillary lymph node, presumed reactive. 2. Heart, mediastinum and hila are unremarkable.  Lungs are clear. Electronically Signed: By: Lajean Manes M.D. On: 02/04/2017 09:12     Caroline More, DO 02/04/2017, 2:27 PM PGY-1, [place] Intern pager: [phone], text pages welcome

## 2017-02-05 LAB — BASIC METABOLIC PANEL
ANION GAP: 7 (ref 5–15)
BUN: 9 mg/dL (ref 6–20)
CHLORIDE: 106 mmol/L (ref 101–111)
CO2: 23 mmol/L (ref 22–32)
Calcium: 8.5 mg/dL — ABNORMAL LOW (ref 8.9–10.3)
Creatinine, Ser: 0.7 mg/dL (ref 0.44–1.00)
GFR calc non Af Amer: >60 mL/min (ref >60)
GLUCOSE: 105 mg/dL — AB (ref 65–99)
Potassium: 3.8 mmol/L (ref 3.5–5.1)
Sodium: 136 mmol/L (ref 135–145)

## 2017-02-05 LAB — CBC
HCT: 36.3 % (ref 36.0–46.0)
HEMOGLOBIN: 11.7 g/dL — AB (ref 12.0–15.0)
MCH: 27 pg (ref 26.0–34.0)
MCHC: 32.2 g/dL (ref 30.0–36.0)
MCV: 83.8 fL (ref 78.0–100.0)
Platelets: 255 10*3/uL (ref 150–400)
RBC: 4.33 MIL/uL (ref 3.87–5.11)
RDW: 13.6 % (ref 11.5–15.5)
WBC: 7.9 10*3/uL (ref 4.0–10.5)

## 2017-02-05 MED ORDER — LOSARTAN POTASSIUM 50 MG PO TABS
50.0000 mg | ORAL_TABLET | Freq: Every day | ORAL | Status: DC
  Administered 2017-02-05 – 2017-02-06 (×2): 50 mg via ORAL
  Filled 2017-02-05 (×2): qty 1

## 2017-02-05 MED ORDER — CLINDAMYCIN HCL 300 MG PO CAPS
450.0000 mg | ORAL_CAPSULE | Freq: Four times a day (QID) | ORAL | Status: DC
  Administered 2017-02-05 – 2017-02-06 (×6): 450 mg via ORAL
  Filled 2017-02-05 (×6): qty 1

## 2017-02-05 NOTE — Progress Notes (Signed)
Patient ID: Stacy Campos, female   DOB: [birth year], 46 y.o.   MRN: [id number]     Subjective: Had some increased spontaneous drainage from her left axilla and is feeling a lot better. Also still having some soreness lateral left breast which is better  Objective: Vital signs in last 24 hours: Temp:  [97.8 F (36.6 C)-98.6 F (37 C)] 98.3 F (36.8 C) (07/15 0656) Pulse Rate:  [78-80] 80 (07/15 0656) Resp:  [17-18] 17 (07/15 0656) BP: (122-158)/(70-99) 122/70 (07/15 0656) SpO2:  [98 %-99 %] 98 % (07/15 0656) Last BM Date: 02/03/17  Intake/Output from previous day: 07/14 0701 - 07/15 0700 In: 1645 [P.O.:940; I.V.:455; IV Piggyback:250] Out: -  Intake/Output this shift: No intake/output data recorded.  General appearance: alert, cooperative and no distress Skin: 2 small open areas left axilla 1 with slight drainage but no palpable significant abscess. No cellulitis. Breasts: Slightly tender noninflamed sebaceous cyst inferior left breast and inframammary crease. No abnormality on exam. The axilla where the breast is sore and CT was negative  Lab Results:   Recent Labs  02/03/17 2342 02/05/17 0603  WBC 9.1 7.9  HGB 11.5* 11.7*  HCT 35.5* 36.3  PLT 273 255   BMET  Recent Labs  02/03/17 2342 02/05/17 0603  NA 139 136  K 3.3* 3.8  CL 105 106  CO2 25 23  GLUCOSE 104* 105*  BUN 11 9  CREATININE 0.77 0.70  CALCIUM 8.4* 8.5*     Studies/Results: Ct Chest W Contrast  Addendum Date: 02/04/2017   ADDENDUM REPORT: 02/04/2017 11:41 ADDENDUM: The left breast is reportedly red and tender. Larger field of view imaging was reviewed to assess the breast and chest wall. The breasts are fatty replaced. There are no breast masses or areas of inflammation. No evidence of an abscess. Electronically Signed   By: Lajean Manes M.D.   On: 02/04/2017 11:41   Result Date: 02/04/2017 CLINICAL DATA:  Concern for hydradenitis and underlying abcess of the L axillae and L Lateral breast. EXAM:  CT CHEST WITH CONTRAST TECHNIQUE: Multidetector CT imaging of the chest was performed during intravenous contrast administration. CONTRAST:  75 mL of Isovue-300 intravenous contrast COMPARISON:  Chest radiograph dated 10/12/2011. FINDINGS: Cardiovascular: Heart is normal in size and configuration. No significant coronary artery calcifications. Great vessels normal in caliber. No aortic dissection or atherosclerosis. Mediastinum/Nodes: No neck base masses. There is a mildly enlarged right axillary lymph node measuring 11 mm in short axis. No soft tissue abscess or inflammation is seen along the visualized axilla. No mediastinal or hilar masses or enlarged lymph nodes. Trachea and esophagus are unremarkable. Lungs/Pleura: Lungs are clear. No pleural effusion or pneumothorax. Upper Abdomen: No acute abnormality. Musculoskeletal: No fracture or acute finding. No osteoblastic or osteolytic lesions. Scoliosis and degenerative endplate spurring along the thoracic spine. IMPRESSION: 1. No soft tissue abscess or significant inflammation evident along the visualized axilla. Mildly enlarged right axillary lymph node, presumed reactive. 2. Heart, mediastinum and hila are unremarkable.  Lungs are clear. Electronically Signed: By: Lajean Manes M.D. On: 02/04/2017 09:12    Anti-infectives: Anti-infectives    Start     Dose/Rate Route Frequency Ordered Stop   02/04/17 0800  vancomycin (VANCOCIN) 1,250 mg in sodium chloride 0.9 % 250 mL IVPB     1,250 mg 166.7 mL/hr over 90 Minutes Intravenous Every 8 hours 02/03/17 2353     02/04/17 0000  vancomycin (VANCOCIN) 2,500 mg in sodium chloride 0.9 % 500 mL IVPB  2,500 mg 250 mL/hr over 120 Minutes Intravenous  Once 02/03/17 2353 02/04/17 0248      Assessment/Plan: Hydradenitis left axilla. This does not appear severe. Spontaneously draining. Would continue dressing changes and I think she could be discharged home on oral antibiotics anytime.    LOS: 2 days     Riham Polyakov T 02/05/2017
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
## 2017-02-06 MED ORDER — CLINDAMYCIN HCL 150 MG PO CAPS: 450.0000 mg | ORAL_CAPSULE | Freq: Four times a day (QID) | ORAL | 0 refills | Status: AC

## 2017-02-06 NOTE — Final Consult Note (Signed)
Consultant Final Sign-Off Note    Assessment/Final recommendations  Stacy Campos is a 46 y.o. female followed by me for left axillary abscess/hydradenitis      Wound care (if applicable): dry dressing changes as needed if draining   Diet at discharge: per primary team   Activity at discharge: per primary team   Follow-up appointment:  None needed   Pending results:  Unresulted Labs    None       Medication recommendations:   Other recommendations:    Thank you for allowing [place] to participate in the care of your patient!  Please consult [place] again if you have further needs for your patient.  Monty Mccarrell L Vercie Pokorny 02/06/2017 8:00 AM    Subjective   Pt states she is feeling better and her pain is greatly improved.   Objective  Vital signs in last 24 hours: Temp:  [97.6 F (36.4 C)-98.1 F (36.7 C)] 98.1 F (36.7 C) (07/16 0552) Pulse Rate:  [81-84] 81 (07/16 0552) Resp:  [18] 18 (07/16 0552) BP: (145-158)/(76-95) 145/76 (07/16 0552) SpO2:  [99 %] 99 % (07/16 0552)  General: Physical Exam  Constitutional: She appears well-developed and well-nourished. No distress.  HENT:  Head: Normocephalic and atraumatic.  Cardiovascular: Normal rate, regular rhythm and normal heart sounds.   No murmur heard. Pulmonary/Chest: Effort normal.  Rate normal  Genitourinary:  Genitourinary Comments: No left breast tenderness, redness or warmth  Skin: Skin is warm and dry. She is not diaphoretic.  Left axilla with one small open wound roughly 0.5x1cm, no drainage or area of fluctuance appreciated  Nursing note and vitals reviewed.    Pertinent labs and Studies:  Recent Labs  02/03/17 2342 02/05/17 0603  WBC 9.1 7.9  HGB 11.5* 11.7*  HCT 35.5* 36.3   BMET  Recent Labs  02/03/17 2342 02/05/17 0603  NA 139 136  K 3.3* 3.8  CL 105 106  CO2 25 23  GLUCOSE 104* 105*  BUN 11 9  CREATININE 0.77 0.70  CALCIUM 8.4* 8.5*   No results for input(s): LABURIN in the last 72  hours. Results for orders placed or performed during the hospital encounter of 07/29/15  Culture, Group A Strep     Status: Abnormal   Collection Time: 07/29/15  4:10 PM  Result Value Ref Range Status   Strep A Culture Comment (A)  Final    Comment: (NOTE) Beta-hemolytic colonies, not group A Streptococcus isolated. Performed At: [facility] [place], [place] [id number] Lindon Romp MD IH:[id number]     Imaging: No results found.   Jackson Latino, [facility] Pager [phone]

## 2017-02-06 NOTE — Discharge Instructions (Signed)
You were admitted for cellulitis concerned for abscess. We started you on IV antibiotics and transitioned you to oral clindamycin. Please take oral clindamycin as prescribed for next 7 days. Please follow up with Dr. Ola Spurr on 02/09/2017 @3 :30pm, please arrive 15 min early.

## 2017-02-06 NOTE — Progress Notes (Signed)
Discharge paperwork reviewed with patient. No questions verbalized. Patient is ready to be discharged.

## 2017-02-08 ENCOUNTER — Ambulatory Visit (INDEPENDENT_AMBULATORY_CARE_PROVIDER_SITE_OTHER): Payer: Medicaid Other | Admitting: Pharmacist Clinician (PhC)/ Clinical Pharmacy Specialist

## 2017-02-08 DIAGNOSIS — I10 Essential (primary) hypertension: Secondary | ICD-10-CM

## 2017-02-08 MED ORDER — LOSARTAN POTASSIUM 100 MG PO TABS: 100.0000 mg | ORAL_TABLET | Freq: Every day | ORAL | 3 refills | Status: DC

## 2017-02-08 NOTE — Patient Instructions (Addendum)
Return for a a follow up appointment in 1 month  Your blood pressure today is 130/90  (goal is < 130/80)  Check your blood pressure at home daily and keep record of the readings.  Take your BP meds as follows:  Increase losartan to 100 mg daily (take 2 tablets daily until 50 mg tabs gone then pick up 100 mg prescription)   Continue with chlorthalidone 25 mg  Bring all of your meds, your BP cuff and your record of home blood pressures to your next appointment.  Exercise as you're able, try to walk approximately 30 minutes per day.  Keep salt intake to a minimum, especially watch canned and prepared boxed foods.  Eat more fresh fruits and vegetables and fewer canned items.  Avoid eating in fast food restaurants.    HOW TO TAKE YOUR BLOOD PRESSURE: . Rest 5 minutes before taking your blood pressure. .  Don't smoke or drink caffeinated beverages for at least 30 minutes before. . Take your blood pressure before (not after) you eat. . Sit comfortably with your back supported and both feet on the floor (don't cross your legs). . Elevate your arm to heart level on a table or a desk. . Use the proper sized cuff. It should fit smoothly and snugly around your bare upper arm. There should be enough room to slip a fingertip under the cuff. The bottom edge of the cuff should be 1 inch above the crease of the elbow. . Ideally, take 3 measurements at one sitting and record the average.

## 2017-02-08 NOTE — Progress Notes (Signed)
02/09/2017 Stacy Campos 04-Feb-1971 [id number]   HPI:  Stacy Campos is a 46 y.o. female patient of Dr Gwenlyn Found, with a [place] below who presents today for hypertension clinic evaluation.  Her cardiac history is significant only for hypertension, but she also suffers from hydradenitis, which recently put her in the hospital for several days.  Her family history is strong for MI and stroke, with 2 half brothers (shared father) both dying after MI, one at age 59 the other (just last month) at 28, and her father having had his first stroke in his 66's while serving in [place].    She is a little tearful in the office today, apparently she was on the phone with her brother last month when he suddenly stopped talking and dropped the phone.  He was 19 and died from an MI.  She has 3 children of her own, the youngest being just 30 and she is determined to get into better shape and eat right.  Her plans to join a fitness challenge were sidetracked by her hidradentis, but she is already looking at joining the next challenge group that she can.    Blood Pressure Goal:  130/80  Current Medications:  Losartan 50 mg qd (am)  Chlorthalidone 25 mg qd (pm)  Family Hx:  Father d/c 59, stroke, CHF from 107's. Stroke in his 105's  Younger siblings, almost all on BP meds since in their 20's  2 brothers d/c MI at 19 at 51 (84 died just last month) (both 1/2 brothers on father side)   Social Hx:  No tobacco, alcohol 3x per year (bday, [place], [place]); low caffeine  Diet:  Mostly home cooking, daughter is learning to [employer] and somewhat "creative", although is getting all of her recipes from a cookbook for diabetes patients. Uses himalayan pink salt; drinks smoothies a few times per week, but admits she'd just rather eat the fruits and vegetables than have them pureed.    Exercise:  Just joined fitness challenge - hosp now has to wait for next group  Home BP readings:  Has home cuff, recently broken.  Diastolic readings  mostly >90 Intolerances:   hctz - urinary frequency  Amlodipine - incontinence  Lisinopril - vision changes  Wt Readings from Last 3 Encounters:  02/03/17 295 lb (133.8 kg)  02/03/17 295 lb (133.8 kg)  01/30/17 295 lb 9.6 oz (134.1 kg)   BP Readings from Last 3 Encounters:  02/08/17 130/90  02/06/17 132/80  02/03/17 (!) 144/90   Pulse Readings from Last 3 Encounters:  02/08/17 92  02/06/17 91  02/03/17 91    Current Outpatient Prescriptions  Medication Sig Dispense Refill  . acetaminophen (TYLENOL) 500 MG tablet Take 1,500 mg by mouth daily as needed (pain).     . chlorthalidone (HYGROTON) 25 MG tablet Take 1 tablet (25 mg total) by mouth daily. (Patient taking differently: Take 25 mg by mouth at bedtime. ) 90 tablet 3  . clindamycin (CLEOCIN) 150 MG capsule Take 3 capsules (450 mg total) by mouth every 6 (six) hours. 84 capsule 0  . losartan (COZAAR) 100 MG tablet Take 1 tablet (100 mg total) by mouth daily. 30 tablet 3  . Multiple Vitamin (MULTIVITAMIN WITH MINERALS) TABS tablet Take 1 tablet by mouth daily.    . naproxen sodium (ALEVE) 220 MG tablet Take 220 mg by mouth every 12 (twelve) hours.     No current facility-administered medications for this visit.     Allergies  Allergen Reactions  . Dimetapp [name] Hives    Past Medical History:  Diagnosis Date  . Arthritis    "knees, back" (02/03/2017)  . Chronic lower back pain   . Daily headache    "related to my BP" (02/03/2017)  . Elevated blood pressure reading without diagnosis of hypertension   . Hidradenitis    "chronic; been ongoing for > 1 yr" (02/03/2017)  . Hypertension   . Scoliosis     Blood pressure 130/90, pulse 92.  HTN (hypertension) Patient with hypertension and strong family history of early heart disease.  She is actively working to lose weight and eat healthier since the death of her brother (age 80 from [place]) in June.  Her plans were sidetracked somewhat by a hospital stay  earlier this month, but she hopes to be back on track within 1-2 weeks.  For now she is to increase the losartan to 100 mg daily and continue with home BP monitoring.  Will see her back in 4 weeks for follow up.     Tommy Medal PharmD CPP [facility]

## 2017-02-09 ENCOUNTER — Ambulatory Visit: Payer: Medicaid Other | Admitting: Internal Medicine

## 2017-02-09 NOTE — Assessment & Plan Note (Addendum)
Patient with hypertension and strong family history of early heart disease.  She is actively working to lose weight and eat healthier since the death of her brother (age 48 from [place]) in June.  Her plans were sidetracked somewhat by a hospital stay earlier this month, but she hopes to be back on track within 1-2 weeks.  For now she is to increase the losartan to 100 mg daily and continue with home BP monitoring.  Will see her back in 4 weeks for follow up.

## 2017-02-10 ENCOUNTER — Encounter: Payer: Self-pay | Admitting: Family Medicine

## 2017-02-10 ENCOUNTER — Ambulatory Visit (INDEPENDENT_AMBULATORY_CARE_PROVIDER_SITE_OTHER): Payer: Medicaid Other | Admitting: Family Medicine

## 2017-02-10 DIAGNOSIS — M79622 Pain in left upper arm: Secondary | ICD-10-CM

## 2017-02-10 NOTE — Progress Notes (Signed)
   Subjective:   Patient ID: Stacy Campos    DOB: April 26, 1971, 46 y.o. female   MRN: [id number]  CC: hospital follow-up   HPI: Stacy Campos is a 46 y.o. female who presents to clinic today for hospital follow up.  L axillary cellulitis  Recently discharged on 7/14.  Cellulitis improving on po Clindamycin. Reports she has been taking the antibiotics as prescribed and has not missed any doses.  Area of cellulitis has dramatically decreased.  Has a foam bandage over the site which has opened and drained on it's own.  Did not have to undergo an I&D during hospitalization.  Currently taking Aleve and Tylenol for pain control.  Has not had any fevers after discharge from hospital.  Denies chills, nausea, vomiting, abdominal pain.  Endorses mild diarrhea likely 2/2 antibiotic use.  Has been drinking probiotic juices to help with symptoms of diarrhea.  Otherwise has been doing well.  Pain is well controlled.  She is able to use the arm as opposed to before where she could not perform daily activities ie driving with that arm.  Has been changing her dressings nightly.  Is not draining as much anymore.  Previously the cellulitis has involved her entire L breast and axilla and has now decreased to one small 2x2 inch area.  Blood pressure is well controlled at this visit - 128/82.    ROS: See HPI for pertinent ROS. [place]: Pertinent past medical, surgical, family, and social history were reviewed and updated as appropriate. Smoking status reviewed. Medications reviewed.  Objective:   BP 128/82   Pulse 93   Temp 98.7 F (37.1 C) (Oral)   Ht 5\' 7"  (1.702 m)   Wt 295 lb (133.8 kg)   SpO2 98%   BMI 46.20 kg/m  Vitals and nursing note reviewed.  General: 46 yo F in good spirits, NAD  HEENT: NCAT, MMM, o/p clear  Neck: supple  CV: RRR no MRG  Lungs: CTAB, comfortable WOB  Abdomen: soft, NTND, +bs  Skin: warm, dry, no rashes, L axilla with overlying foam bandage, small amount of serous drainage from open  site, no surrounding erythema, warmth or tenderness.  No induration or fluctuance.  Extremities: warm and well perfused  Psych: normal mood and affect   Assessment & Plan:   Left axillary pain Secondary to L axillary cellulitis. Was recently discharged from hospital on 7/14 on po Clindamycin.  Cellulitis greatly improved.  Reports mild diarrhea likely 2/2 antibiotic use and has been drinking probiotic juices which have helped.   Afebrile and feels well since discharge. Wound appears clean, dry and intact with minimal serous discharge.  -Recommend continue Clindamycin course as prescribed  -Tylenol prn for pain  -Follow up in clinic if fevers, area is worsening or cellulitis returns  Follow up: as needed or if symptoms fail to improve   Stacy Kim, MD [place], PGY-1 02/15/2017 1:59 PM

## 2017-02-10 NOTE — Patient Instructions (Signed)
You were seen in clinic for hospital follow up.  Your area of cellulitis appears to have improved significantly with antibiotics.  Please continue your Clindamycin as prescribed to complete the course.  Your blood pressure today was also normal and as it has been high before, we will keep an eye on this.  Please follow up in clinic if you have any questions.    Be well,  Lovenia Kim, MD

## 2017-02-15 NOTE — Assessment & Plan Note (Addendum)
Secondary to L axillary cellulitis. Was recently discharged from hospital on 7/14 on po Clindamycin.  Cellulitis greatly improved.  Reports mild diarrhea likely 2/2 antibiotic use and has been drinking probiotic juices which have helped.   Afebrile and feels well since discharge. Wound appears clean, dry and intact with minimal serous discharge.  -Recommend continue Clindamycin course as prescribed  -Tylenol prn for pain  -Follow up in clinic if fevers, area is worsening or cellulitis returns

## 2017-02-17 ENCOUNTER — Encounter: Payer: Self-pay | Admitting: Family Medicine

## 2017-02-17 ENCOUNTER — Ambulatory Visit (INDEPENDENT_AMBULATORY_CARE_PROVIDER_SITE_OTHER): Payer: Medicaid Other | Admitting: Family Medicine

## 2017-02-17 VITALS — BP 132/88 | HR 83 | Temp 98.1°F | Ht 67.0 in | Wt 295.4 lb

## 2017-02-17 DIAGNOSIS — N61 Mastitis without abscess: Secondary | ICD-10-CM

## 2017-02-17 HISTORY — DX: Mastitis without abscess: N61.0

## 2017-02-17 MED ORDER — SULFAMETHOXAZOLE-TRIMETHOPRIM 800-160 MG PO TABS: 2.0000 | ORAL_TABLET | Freq: Two times a day (BID) | ORAL | 0 refills | Status: AC

## 2017-02-17 NOTE — Patient Instructions (Addendum)
Mastitis Mastitis is redness, soreness, and puffiness (inflammation) in an area of the breast. It is often caused by an infection that occurs when bacteria enter the skin. The infection is often helped by antibiotic medicine. Follow these instructions at home:  Only take medicines as told by your doctor.  If your doctor prescribed an antibiotic medicine, take it as told. Finish it even if you start to feel better.  Do not wear a tight or underwire bra. Wear a soft support bra.  Drink more fluids, especially if you have a fever.  If you are breastfeeding: ? Keep emptying the breast. Your doctor can tell you if the milk is safe. Use a breast pump if you are told to stop nursing. ? Keep your nipples clean and dry. ? Empty the first breast before going to the other breast. Use a breast pump if your baby is not emptying your breast. ? If you go back to work, pump your breasts while at work. ? Avoid letting your breasts get overly filled with milk (engorged). Contact a doctor if:  You have pus-like fluid leaking from your breast.  Your symptoms do not get better within 2 days. Get help right away if:  Your pain and puffiness are getting worse.  Your pain is not helped by medicine.  You have a red line going from your breast toward your armpit.  You have a fever or lasting symptoms for more than 2-3 days.  You have a fever and your symptoms suddenly get worse. This information is not intended to replace advice given to you by your health care provider. Make sure you discuss any questions you have with your health care provider. Document Released: 06/29/2009 Document Revised: 12/17/2015 Document Reviewed: 02/08/2013 Elsevier Interactive Patient Education  2017 [employer].   It was a pleasure seeing you today.   Please take 2 double strength bactrim tablets two times a day for 7 days. Please apply warm compresses to the area twice a day. Please stop taking clindamycin   Please  follow up with clinic in 1 week.   If symptoms worsen or you develop nausea, vomiting, fever, please call IMMEDIATELY.   Thank you,  Caroline More, DO

## 2017-02-17 NOTE — Assessment & Plan Note (Addendum)
Patient complains of knot under left breast with associated tenderness throughout breast radiating into left axilla. Patient was recently hospitalized for similar condition and treated with IV vancomycin x 2 days and discharged with oral clindamycin x 8 days. Patients symptoms initially improved but began to worsen on Tuesday. Patient also notes left breast swelling. Patient denies fever, chills, nausea, vomiting.   -Bactrim double strength 2 tablets bid x 7 days for MRSA coverage  -warm compresses bid -discontinue clindamycin  -follow up in one week or sooner if symptoms worsen -advised to come in sooner if symptoms of nausea, vomiting, or fever arise  -consider ultrasound or CT at follow up if symptoms do not improve

## 2017-02-17 NOTE — Progress Notes (Signed)
Subjective:    Patient ID: Stacy Campos, female    DOB: August 16, 1970, 46 y.o.   MRN: [id number]   CC: knot under left breast  HPI:  Knot under Left breast Patient reports symptoms originally started in June. Patient noticed that knot first started small but gradually became bigger. While knot increased in size, boils under left under arm subsequently increased in size. Patient has a history of hydradenitis. Patient was recently hospitalized 2 weeks ago for the same condition, but at that time patient could not move left arm. While hospitalized CT scan showed no abscess at time of admission. Patient was given IV vancomycin x 2 days and discharged with oral clindamycin x 8 days. Patient's symptoms originally improved, but patient noticed on Tuesday of this week knot under left breast got dark and began to hurt again. Patient states pain radiates throughout left breast and into left axilla. Patient currently has 1 day left of oral clindamycin. Along with pain, patient has also noted breast swelling. Denies redness, fever, and chills, nausea, or vomiting. Patient states it feels similar to before she was hospitalized, but doesn't want it to get to that point. Patient began to have some diarrhea after clindamycin initiation but has been using probiotics to help with this.Patient has been using cold compress for pain relief and warm compresses twice a day in the shower. Patient has recently stopped using antiperspirant, has been eating less sugar in her diet, and using pH balanced water to help improve condition. Patient uses [employer] and has tried antibacterial sprays in the past, but has reacted poorly to them. Patient performs most ADLs with left arm, making it difficult to perform ADLs.  Patient denies vomiting, SOB, urinary sx, or chest pain.   Smoking status reviewed Former smoker >30 years ago  Review of Systems: See HPI   Objective:  BP 132/88   Pulse 83   Temp 98.1 F (36.7 C) (Oral)   Ht  5\' 7"  (1.702 m)   Wt 295 lb 6.4 oz (134 kg)   SpO2 97%   BMI 46.27 kg/m  Vitals and nursing note reviewed  General: well nourished, in no acute distress HEENT: normocephalic Neck: supple, non-tender, without lymphadenopathy Cardiac: RRR, clear S1 and S2, no murmurs, rubs, or gallops Respiratory: clear to auscultation bilaterally, no increased work of breathing Abdomen: soft, nontender, nondistended, Bowel sounds present x 4 quadrants  Extremities: no edema or cyanosis. Warm, well perfused. 2+ radial and PT pulses bilaterally Skin: warm and dry, dark brown nodule under left breast, tender, slightly fluctuant but difficult to palpate due to patient discomfort, tenderness tracking to left underarm, several nodules present in left underarm, slight edema of left breast  Neuro: alert and oriented, no focal deficits   Assessment & Plan:    Mastitis, left, acute Patient complains of knot under left breast with associated tenderness throughout breast radiating into left axilla. Patient was recently hospitalized for similar condition and treated with IV vancomycin x 2 days and discharged with oral clindamycin x 8 days. Patients symptoms initially improved but began to worsen on Tuesday. Patient also notes left breast swelling. Patient denies fever, chills, nausea, vomiting.   -Bactrim double strength 2 tablets bid x 7 days for MRSA coverage  -warm compresses bid -discontinue clindamycin  -follow up in one week or sooner if symptoms worsen -advised to come in sooner if symptoms of nausea, vomiting, or fever arise  -consider ultrasound or CT at follow up if symptoms do not improve  Return in about 1 week (around 02/24/2017) for follow up for mastitis.  Patient has understood and agreed with plan   Caroline More, DO, PGY-1

## 2017-03-09 ENCOUNTER — Ambulatory Visit (INDEPENDENT_AMBULATORY_CARE_PROVIDER_SITE_OTHER): Payer: Medicaid Other | Admitting: Pharmacist

## 2017-03-09 VITALS — BP 138/84 | HR 98 | Wt 294.2 lb

## 2017-03-09 DIAGNOSIS — I10 Essential (primary) hypertension: Secondary | ICD-10-CM | POA: Diagnosis not present

## 2017-03-09 MED ORDER — METOPROLOL SUCCINATE ER 50 MG PO TB24: 50.0000 mg | ORAL_TABLET | Freq: Every evening | ORAL | 1 refills | Status: DC

## 2017-03-09 MED ORDER — METOPROLOL SUCCINATE ER 25 MG PO TB24: 25.0000 mg | ORAL_TABLET | Freq: Every evening | ORAL | 1 refills | Status: DC

## 2017-03-09 MED ORDER — CHLORTHALIDONE 25 MG PO TABS: 25.0000 mg | ORAL_TABLET | Freq: Every day | ORAL | 1 refills | Status: DC | PRN

## 2017-03-09 NOTE — Progress Notes (Signed)
Patient ID: Stacy Campos                 DOB: 1971-03-06                      MRN: [id number]     HPI: Stacy Campos is a 46 y.o. female patient of Dr Gwenlyn Found, with a PMH significant  for hypertension and hydradenitis with hospital admission for antibiotics and abscess treatment.  Her family history is strong for MI and stroke, with 2 half brothers (shared father) both dying after MI, one at age 13 the other (just last month) at 46, and her father having had his first stroke in his 58's while serving in [place]. During most recent HTN visit her losartan dose was increased from 50mg  daily to 100mg  daily. Patient also working on lifestyle modifications and weight loss program.  Patient presents to HTN clinic for follow up. Denies dizziness, increased fatigue, swelling, chest pain or shortness of breath. She quit her weight loss program for now, planning to go back next month. Report she takes chlorthalidone only as needed due to incontinence but report compliance with losartan 100mg  daily.  Current HTN meds:   Losartan 100mg  daily  Chlorthalidone 25mg  daily (not taking every day) - last dose on Tuesday   Previously tried:              hctz - urinary frequency             Amlodipine - incontinence             Lisinopril - vision changes  BP goal: 130/80  Family History:             Father d/c 56, stroke, CHF from 73's. Stroke in his 28's             Younger siblings, almost all on BP meds since in their 20's             2 brothers d/c MI at 86 at 64 (74 died just last month) (both 1/2 brothers on father side)              Social History: No tobacco, alcohol 3x per year (bday, [place], [place]); low caffeine  Diet:  Mostly home cooking, daughter is learning to [employer] and somewhat "creative", although is getting all of her recipes from a cookbook for diabetes patients. Uses himalayan pink salt; drinks smoothies a few times per week, but admits she'd just rather eat the fruits and vegetables than have them  pureed.    Exercise: quit for a while, plans to resume program as soon as she complete her antibiotics  Home BP readings: none available; patient has home BP monitor but forgot records at home  Wt Readings from Last 3 Encounters:  03/09/17 294 lb 3.2 oz (133.4 kg)  02/17/17 295 lb 6.4 oz (134 kg)  02/10/17 295 lb (133.8 kg)   BP Readings from Last 3 Encounters:  03/09/17 138/84  02/17/17 132/88  02/10/17 128/82   Pulse Readings from Last 3 Encounters:  03/09/17 98  02/17/17 83  02/10/17 93   13 Past Medical History:  Diagnosis Date  . Arthritis    "knees, back" (02/03/2017)  . Chronic lower back pain   . Daily headache    "related to my BP" (02/03/2017)  . Elevated blood pressure reading without diagnosis of hypertension   . Hidradenitis    "chronic; been ongoing for > 1 yr" (02/03/2017)  .  Hypertension   . Scoliosis     Current Outpatient Prescriptions on File Prior to Visit  Medication Sig Dispense Refill  . acetaminophen (TYLENOL) 500 MG tablet Take 1,500 mg by mouth daily as needed (pain).     Marland Kitchen losartan (COZAAR) 100 MG tablet Take 1 tablet (100 mg total) by mouth daily. 30 tablet 3  . Multiple Vitamin (MULTIVITAMIN WITH MINERALS) TABS tablet Take 1 tablet by mouth daily.    . naproxen sodium (ALEVE) 220 MG tablet Take 220 mg by mouth every 12 (twelve) hours.     No current facility-administered medications on file prior to visit.     Allergies  Allergen Reactions  . Dimetapp [Albertsons Di Bromm] Hives    Blood pressure 138/84, pulse 98, weight 294 lb 3.2 oz (133.4 kg), SpO2 98 %.  HTN (hypertension) Blood pressure remains slightly above desired goal range and unchanged from previous clinic readings. Patient takes chlorthalidone ONLY as needed with last dose taken > 48 hours ago. She report issues with incontinence and will NOT take any diuretic daily. Also noted ADR with amlodipine and persistent HR > 80 bpm. Will initiate metoprolol succinate 25mg  every  evening to improve tolerability and BP readings. Will keep chlorthalidone 25mg  daily as needed for edema. Patient instructed to monitor BP twice daily and keep records to bring to next f/u appointment in 4 weeks.   Stacy Campos PharmD, [place] [id number] 03/10/2017 7:43 AM

## 2017-03-09 NOTE — Patient Instructions (Addendum)
Return for a a follow up appointment in 4 weeks  Your blood pressure today is 138/84 pulse 98  Check your blood pressure at home daily (if able) and keep record of the readings.  Take your BP meds as follows: *START metoprolol 25mg  every evening* *CHANGE chlorthalidone to as needed for fluid retention *Continue lifestyle modifications  Continue ALL other medication as prescribed  Bring  your BP cuff and your record of home blood pressures to your next appointment.  Exercise as you're able, try to walk approximately 30 minutes per day.  Keep salt intake to a minimum, especially watch canned and prepared boxed foods.  Eat more fresh fruits and vegetables and fewer canned items.  Avoid eating in fast food restaurants.    HOW TO TAKE YOUR BLOOD PRESSURE: . Rest 5 minutes before taking your blood pressure. .  Don't smoke or drink caffeinated beverages for at least 30 minutes before. . Take your blood pressure before (not after) you eat. . Sit comfortably with your back supported and both feet on the floor (don't cross your legs). . Elevate your arm to heart level on a table or a desk. . Use the proper sized cuff. It should fit smoothly and snugly around your bare upper arm. There should be enough room to slip a fingertip under the cuff. The bottom edge of the cuff should be 1 inch above the crease of the elbow. . Ideally, take 3 measurements at one sitting and record the average.

## 2017-03-10 ENCOUNTER — Other Ambulatory Visit: Payer: Self-pay | Admitting: Cardiovascular Disease

## 2017-03-10 LAB — BASIC METABOLIC PANEL
BUN / CREAT RATIO: 9 (ref 9–23)
BUN: 8 mg/dL (ref 6–24)
CHLORIDE: 104 mmol/L (ref 96–106)
CO2: 25 mmol/L (ref 20–29)
CREATININE: 0.89 mg/dL (ref 0.57–1.00)
Calcium: 9.2 mg/dL (ref 8.7–10.2)
GFR calc non Af Amer: 77 mL/min/{1.73_m2} (ref >59)
GFR, EST AFRICAN AMERICAN: 89 mL/min/{1.73_m2} (ref >59)
GLUCOSE: 88 mg/dL (ref 65–99)
Potassium: 4.5 mmol/L (ref 3.5–5.2)
SODIUM: 143 mmol/L (ref 134–144)

## 2017-03-10 MED ORDER — METOPROLOL SUCCINATE ER 25 MG PO TB24: 25.0000 mg | ORAL_TABLET | Freq: Every evening | ORAL | 1 refills | Status: DC

## 2017-03-10 NOTE — Assessment & Plan Note (Signed)
Blood pressure remains slightly above desired goal range and unchanged from previous clinic readings. Patient takes chlorthalidone ONLY as needed with last dose taken > 48 hours ago. She report issues with incontinence and will NOT take any diuretic daily. Also noted ADR with amlodipine and persistent HR > 80 bpm. Will initiate metoprolol succinate 25mg  every evening to improve tolerability and BP readings. Will keep chlorthalidone 25mg  daily as needed for edema. Patient instructed to monitor BP twice daily and keep records to bring to next f/u appointment in 4 weeks.

## 2017-04-12 ENCOUNTER — Ambulatory Visit (INDEPENDENT_AMBULATORY_CARE_PROVIDER_SITE_OTHER): Payer: Medicaid Other | Admitting: Pharmacist

## 2017-04-12 VITALS — BP 144/88 | HR 94

## 2017-04-12 DIAGNOSIS — I10 Essential (primary) hypertension: Secondary | ICD-10-CM

## 2017-04-12 MED ORDER — METOPROLOL SUCCINATE ER 50 MG PO TB24: 50.0000 mg | ORAL_TABLET | Freq: Every evening | ORAL | 1 refills | Status: DC

## 2017-04-12 NOTE — Progress Notes (Signed)
Patient ID: Stacy Campos                 DOB: [birth year]                      MRN: [id number]     HPI: Stacy Campos is a 46 y.o. female patient of Dr Gwenlyn Found, with a PMH significant  forhypertension and hydradenitis with hospital admission for antibiotics and abscess treatment. Her family history is strong for MI and stroke, with 2 half brothers (shared father) both dying after MI, one at age 109 the other (just last month) at 80, and her father having had his first stroke in his 21's while serving in [place]. During most recent HTN follow up visit metoprolol succinate 25mg  was added to her therapy. Patient presents to HTN clinic for follow up. Denies dizziness, increased fatigue, swelling, chest pain or shortness of breath. Reports she completely stopped taking chlorthalidone due to urinary incontinence.  Current HTN meds:              Losartan 100mg  daily evening  Metoprolol succinate 25mg  at bedtime             Chlorthalidone 25mg  daily PRN for fluid retention - none recentlysitting              Previously tried:  HCTZ - urinary frequency Amlodipine - incontinence Lisinopril - vision changes  BP goal: 130/80  Family History: Father d/c 12, stroke, CHF from 60's. Stroke in his 60's Younger siblings, almost all on BP meds since in their 20's 2 brothers d/c MI at 88 at 74 (73 died just last month) (both 1/2 brothers on father side)  Social History: No tobacco, alcohol 3x per year (bday, [place], [place]); low caffeine  Diet:Mostly home cooking, daughter is learning to [employer] and somewhat "creative", although is getting all of her recipes from a cookbook for diabetes patients. Uses himalayan pink salt only 1/4 of recipe requirements; drinks smoothies a few times per week, but admits she'd just rather eat the fruits and vegetables than have them pureed.   Exercise: quit for a while, plans to resume program as soon as she  complete her antibiotics   Home BP readings: none available  Wt Readings from Last 3 Encounters:  03/09/17 294 lb 3.2 oz (133.4 kg)  02/17/17 295 lb 6.4 oz (134 kg)  02/10/17 295 lb (133.8 kg)   BP Readings from Last 3 Encounters:  04/12/17 (!) 144/88  03/09/17 138/84  02/17/17 132/88   Pulse Readings from Last 3 Encounters:  04/12/17 94  03/09/17 98  02/17/17 83    Past Medical History:  Diagnosis Date  . Arthritis    "knees, back" (02/03/2017)  . Chronic lower back pain   . Daily headache    "related to my BP" (02/03/2017)  . Elevated blood pressure reading without diagnosis of hypertension   . Hidradenitis    "chronic; been ongoing for > 1 yr" (02/03/2017)  . Hypertension   . Scoliosis     Current Outpatient Prescriptions on File Prior to Visit  Medication Sig Dispense Refill  . acetaminophen (TYLENOL) 500 MG tablet Take 1,500 mg by mouth daily as needed (pain).     Marland Kitchen losartan (COZAAR) 100 MG tablet Take 1 tablet (100 mg total) by mouth daily. 30 tablet 3  . Multiple Vitamin (MULTIVITAMIN WITH MINERALS) TABS tablet Take 1 tablet by mouth daily.    . naproxen sodium (ALEVE) 220 MG tablet Take 220  mg by mouth every 12 (twelve) hours.    . chlorthalidone (HYGROTON) 25 MG tablet Take 1 tablet (25 mg total) by mouth daily as needed. For leg edema or fluid retention (Patient not taking: Reported on 04/12/2017) 30 tablet 1   No current facility-administered medications on file prior to visit.     Allergies  Allergen Reactions  . Dimetapp [Albertsons Di Bromm] Hives    Blood pressure (!) 144/88, pulse 94.  HTN (hypertension) Blood pressure increased from previous readings. Noted BPO was better controlled with chlorthalidone 25mg  daily but patient discontinue it used due to urinary incontinence. Will increase metoprolol to 50mg  daily and continue lifestyle modifications. Encouraged to monitor BP twice daily and bring records to next follow up visit in 4 weeks.   Weyman Bogdon  Rodriguez-Guzman PharmD, BCPS, [place] [address] [place] [address] 04/12/2017 8:18 PM

## 2017-04-12 NOTE — Assessment & Plan Note (Signed)
Blood pressure increased from previous readings. Noted BPO was better controlled with chlorthalidone 25mg  daily but patient discontinue it used due to urinary incontinence. Will increase metoprolol to 50mg  daily and continue lifestyle modifications. Encouraged to monitor BP twice daily and bring records to next follow up visit in 4 weeks.

## 2017-04-12 NOTE — Patient Instructions (Addendum)
Return for a  follow up appointment in 4 weeks  Your blood pressure today is 144/88 pulse 94  Check your blood pressure at home daily (if able) and keep record of the readings.  Take your BP meds as follows: INCREASE metoprolol to 50mg  daily at bedtime (okay to take 2 tablets of metoprolol 25mg ) ALL other medication as precribed  Bring all your BP cuff and your record of home blood pressures to your next appointment.  Exercise as you're able, try to walk approximately 30 minutes per day.  Keep salt intake to a minimum, especially watch canned and prepared boxed foods.  Eat more fresh fruits and vegetables and fewer canned items.  Avoid eating in fast food restaurants.    HOW TO TAKE YOUR BLOOD PRESSURE: . Rest 5 minutes before taking your blood pressure. .  Don't smoke or drink caffeinated beverages for at least 30 minutes before. . Take your blood pressure before (not after) you eat. . Sit comfortably with your back supported and both feet on the floor (don't cross your legs). . Elevate your arm to heart level on a table or a desk. . Use the proper sized cuff. It should fit smoothly and snugly around your bare upper arm. There should be enough room to slip a fingertip under the cuff. The bottom edge of the cuff should be 1 inch above the crease of the elbow. . Ideally, take 3 measurements at one sitting and record the average.

## 2017-04-16 ENCOUNTER — Encounter: Payer: Self-pay | Admitting: Pharmacist

## 2017-05-02 NOTE — Progress Notes (Signed)
   [facility] Phone: [phone]   Date of Visit: 05/03/2017   HPI:  Boils/Hydradinitis Suppurativa :  - reports of having a boil under her left breast since the end of June. She also has small boils on her left axilla.  - this was treated with multiple courses of Doxycycline as an outpatient but did not improve. She had to be admitted to the hospital in 7/13 to receive IV antibiotics. She was then transitioned to PO Clindamycin.  - she was seen in clinic on 7/27 for worsening symptoms. She was started on 7 day course of Bactrim. She reports resolution of her axillary lesions with this and improvement of her lesion under the left breast, but this never completely resolved.  - her symptoms worsened and the lesions became larger at the end of August. The lesions this time are located on the same place. The lesion under the left breast has been draining for the past 2 weeks or so. It is draining brown, green, yellow discharge with sometimes blood. - these lesions are very painful.  - denies fevers or chills   ROS: See HPI.  [place]:  PMH: HTN Hydradenitis Carpal Tunnel Syndrome  PHYSICAL EXAM: BP (!) 140/100   Pulse 93   Temp 98.4 F (36.9 C) (Oral)   Wt 290 lb (131.5 kg)   SpO2 95%   BMI 45.42 kg/m  Gen: Appears uncomfortable when moving her left arm to show axillae, nontoxic appearing  Skin: Left axilla- small tender nodules that are less than 1cm in diameter on the left axilla. No significant fluctuance noted. No significant erythema or increased warmth of the area.  Lesion at the base of the left inferior lateral breast- this is more on the chest wall than on the breast. The lesion is an indurated area about 3cm in diameter and the overlying skin is hyperpigmented. It has a central opening that is not a tract and without active drainage. This area and the surrounding area which includes the inferior-lateral breast is very tender to palpation. There is no  fluctuance that I can appreciate. No erythema overlying the area and does not particularly have increased warmth.   ASSESSMENT/PLAN:  Hydradenitis Consistent with hydradenitis. Due to continued/recurrent symptoms at the same area, would benefit from surgical evaluation. Referral to surgery. Will also treat with Bactrim DS 2 tabs BID x 10 days. Discussed monitoring for side effects of medication.   - Flu vaccine today   Smiley Houseman, MD PGY [place]

## 2017-05-03 ENCOUNTER — Ambulatory Visit (INDEPENDENT_AMBULATORY_CARE_PROVIDER_SITE_OTHER): Payer: Medicaid Other | Admitting: Internal Medicine

## 2017-05-03 ENCOUNTER — Encounter: Payer: Self-pay | Admitting: Internal Medicine

## 2017-05-03 VITALS — BP 140/100 | HR 93 | Temp 98.4°F | Wt 290.0 lb

## 2017-05-03 DIAGNOSIS — L732 Hidradenitis suppurativa: Secondary | ICD-10-CM | POA: Diagnosis present

## 2017-05-03 DIAGNOSIS — Z23 Encounter for immunization: Secondary | ICD-10-CM

## 2017-05-03 MED ORDER — SULFAMETHOXAZOLE-TRIMETHOPRIM 800-160 MG PO TABS: 2.0000 | ORAL_TABLET | Freq: Two times a day (BID) | ORAL | 0 refills | Status: DC

## 2017-05-03 NOTE — Patient Instructions (Signed)
We will refer you to surgery  I prescribed Bactrim for 10 days. Please seek care if you start to get a rash (and stop the medication)

## 2017-05-04 NOTE — Assessment & Plan Note (Signed)
Consistent with hydradenitis. Due to continued/recurrent symptoms at the same area, would benefit from surgical evaluation. Referral to surgery. Will also treat with Bactrim DS 2 tabs BID x 10 days. Discussed monitoring for side effects of medication.

## 2017-05-08 ENCOUNTER — Ambulatory Visit (INDEPENDENT_AMBULATORY_CARE_PROVIDER_SITE_OTHER): Payer: Medicaid Other | Admitting: Pharmacist

## 2017-05-08 ENCOUNTER — Telehealth: Payer: Self-pay | Admitting: *Deleted

## 2017-05-08 VITALS — BP 138/86 | HR 106

## 2017-05-08 DIAGNOSIS — I10 Essential (primary) hypertension: Secondary | ICD-10-CM | POA: Diagnosis not present

## 2017-05-08 NOTE — Progress Notes (Signed)
Patient ID: Stacy Campos                 DOB: 1971/04/12                      MRN: [id number]     HPI: Stacy Campos is a 46 y.o. female patient of Dr Gwenlyn Found, with a PMH significant  forhypertension and hydradenitis with hospital admission for antibiotics and abscess treatment. Her family history is strong for MI and stroke, with 2 half brothers (shared father) both dying after MI, one at age 82 the other, and her father having had his first stroke in his 45's while serving in [place]. During most recent HTN follow up visit metoprolol succinate dose was increased from 25mg  to 50mg  daily. Noted she is on antibiotics to treat ongoing infection related to Hidradenitis.  Patient presents today for HTN follow up. Denies dizziness, increased fatigue, swelling, chest pain or shortness of breath. No chlorthalidone dose taken this month. She report some tenderness and discomfort on let left breast and is currently draining. Patient expecting call for surgery to drainage and debridement of boils in axilla and breast area.  Current HTN meds:              Losartan 100mg  daily evening  Metoprolol succinate 50mg  at bedtime             Chlorthalidone 25mg  daily PRN for fluid retention - none recently              Previously tried:  HCTZ - urinary frequency Amlodipine - incontinence Lisinopril - vision changes  BP goal: 130/80  Family History: Father d/c 41, stroke, CHF from 76's. Stroke in his 67's Younger siblings, almost all on BP meds since in their 20's 2 brothers d/c MI at 72 at 9 (38 died just last month) (both 1/2 brothers on father side)  Social History: No tobacco, alcohol 3x per year (bday, [place], [place]); low caffeine  Diet:Mostly home cooking, daughter is learning to [employer] and somewhat "creative", although is getting all of her recipes from a cookbook for diabetes patients. Uses himalayan pink salt only 1/4 of recipe  requirements; drinks smoothies a few times per week, but admits she'd just rather eat the fruits and vegetables than have them pureed.   Exercise: sedentary  Home BP readings: none available  Wt Readings from Last 3 Encounters:  05/03/17 290 lb (131.5 kg)  03/09/17 294 lb 3.2 oz (133.4 kg)  02/17/17 295 lb 6.4 oz (134 kg)   BP Readings from Last 3 Encounters:  05/08/17 138/86  05/03/17 (!) 140/100  04/12/17 (!) 144/88   Pulse Readings from Last 3 Encounters:  05/08/17 (!) 106  05/03/17 93  04/12/17 94    Past Medical History:  Diagnosis Date  . Arthritis    "knees, back" (02/03/2017)  . Chronic lower back pain   . Daily headache    "related to my BP" (02/03/2017)  . Elevated blood pressure reading without diagnosis of hypertension   . Hidradenitis    "chronic; been ongoing for > 1 yr" (02/03/2017)  . Hypertension   . Scoliosis     Current Outpatient Prescriptions on File Prior to Visit  Medication Sig Dispense Refill  . acetaminophen (TYLENOL) 500 MG tablet Take 1,500 mg by mouth daily as needed (pain).     . chlorthalidone (HYGROTON) 25 MG tablet Take 1 tablet (25 mg total) by mouth daily as needed. For leg edema or fluid retention  30 tablet 1  . losartan (COZAAR) 100 MG tablet Take 1 tablet (100 mg total) by mouth daily. 30 tablet 3  . metoprolol succinate (TOPROL-XL) 50 MG 24 hr tablet Take 1 tablet (50 mg total) by mouth every evening. Take with or immediately following a meal. 30 tablet 1  . Multiple Vitamin (MULTIVITAMIN WITH MINERALS) TABS tablet Take 1 tablet by mouth daily.    . naproxen sodium (ALEVE) 220 MG tablet Take 220 mg by mouth every 12 (twelve) hours.    Marland Kitchen sulfamethoxazole-trimethoprim (BACTRIM DS,SEPTRA DS) 800-160 MG tablet Take 2 tablets by mouth 2 (two) times daily. 40 tablet 0   No current facility-administered medications on file prior to visit.     Allergies  Allergen Reactions  . Dimetapp [Albertsons Di Bromm] Hives    Blood pressure  138/86, pulse (!) 106, SpO2 97 %.  HTN (hypertension) Blood pressure is better controlled today but noted HR slightly elevated. She is currently in some pain and on antibiotics therapy for infection due to hidradenitis. Patient denies palpitations, dizziness, headaches or any other symptoms associated to elevated HR. Will continue current anti-hypertensive regimen without changes. Patient waiting for surgical intervention for drainage and debridement of boils on breast. She is to continue lifestyle modifications and follow up with HTN clinic in 8 weeks. Patient was also encouraged to contact clinic if new symptoms develop or for questions in between appointments.   Stacy Campos PharmD, BCPS, [place] [id number] 05/09/2017 10:33 AM

## 2017-05-08 NOTE — Patient Instructions (Addendum)
Return for a follow up appointment in 8 weeks  Your blood pressure today is 138/86 pulse 106  Check your blood pressure at home daily (if able) and keep record of the readings.  Take your BP meds as follows: *CONTINUE all medication as prescribed*  Bring all of your meds, your BP cuff and your record of home blood pressures to your next appointment.  Exercise as you're able, try to walk approximately 30 minutes per day.  Keep salt intake to a minimum, especially watch canned and prepared boxed foods.  Eat more fresh fruits and vegetables and fewer canned items.  Avoid eating in fast food restaurants.    HOW TO TAKE YOUR BLOOD PRESSURE: . Rest 5 minutes before taking your blood pressure. .  Don't smoke or drink caffeinated beverages for at least 30 minutes before. . Take your blood pressure before (not after) you eat. . Sit comfortably with your back supported and both feet on the floor (don't cross your legs). . Elevate your arm to heart level on a table or a desk. . Use the proper sized cuff. It should fit smoothly and snugly around your bare upper arm. There should be enough room to slip a fingertip under the cuff. The bottom edge of the cuff should be 1 inch above the crease of the elbow. . Ideally, take 3 measurements at one sitting and record the average.

## 2017-05-08 NOTE — Telephone Encounter (Signed)
Patient left message on nurse line stating she is suppose to have pre-op work up and want to know when to schedule.  Please give her a call.  Number on file is correct.  Derl Barrow, RN

## 2017-05-09 ENCOUNTER — Encounter: Payer: Self-pay | Admitting: Pharmacist

## 2017-05-09 NOTE — Telephone Encounter (Signed)
Discussed with patient and gave her the contact information for surgery. Patient had no further questions.

## 2017-05-09 NOTE — Assessment & Plan Note (Signed)
Blood pressure is better controlled today but noted HR slightly elevated. She is currently in some pain and on antibiotics therapy for infection due to hidradenitis. Patient denies palpitations, dizziness, headaches or any other symptoms associated to elevated HR. Will continue current anti-hypertensive regimen without changes. Patient waiting for surgical intervention for drainage and debridement of boils on breast. She is to continue lifestyle modifications and follow up with HTN clinic in 8 weeks. Patient was also encouraged to contact clinic if new symptoms develop or for questions in between appointments.

## 2017-05-22 ENCOUNTER — Ambulatory Visit: Payer: Self-pay | Admitting: Surgery

## 2017-05-22 DIAGNOSIS — L732 Hidradenitis suppurativa: Secondary | ICD-10-CM | POA: Diagnosis not present

## 2017-05-22 NOTE — H&P (Signed)
Stacy Campos 05/22/2017 11:33 AM Location: [facility] Patient #: [id number] DOB: 12/04/1970 Separated / Language: Stacy Campos / Race: Black or African American Female  History of Present Illness [name] [name] MD; 05/22/2017 11:54 AM) Patient words: Patient presents for recurrent hidradenitis involving her left inferior breast. She has a history of bilateral axillary hidradenitis. She developed an area about 8 weeks ago involving the inferior portion of her left breast in the inferior mammary crease laterally. She's been on antibiotics and this is helped but it recurs once her antibiotics stopped. She complains of pain, redness, drainage from the area.  The patient is a 46 year old female.   Past Surgical History Stacy Campos, [place]; 05/22/2017 11:33 AM) Knee Surgery Left. Oral Surgery  Diagnostic Studies History Stacy Campos, [place]; 05/22/2017 11:33 AM) Colonoscopy never Mammogram within last year Pap Smear 1-5 years ago  Allergies Stacy Campos, RMA; 05/22/2017 11:33 AM) No Known Allergies 05/22/2017  Medication History Stacy Campos, RMA; 05/22/2017 11:36 AM) Metoprolol Succinate ER (25MG  Tablet ER 24HR, Oral) Active. Losartan Potassium (100MG  Tablet, Oral) Active. Ibuprofen (800MG  Tablet, Oral) Active. Aleve (220MG  Tablet, Oral) Active. Multivitamins (Oral) Active. Chlorthalidone (25MG  Tablet, Oral) Active. Medications Reconciled  Social History Stacy Campos, [place]; 05/22/2017 11:33 AM) Alcohol use Occasional alcohol use. Caffeine use Tea. No drug use Tobacco use Former smoker.  Family History Stacy Campos, [place]; 05/22/2017 11:33 AM) Alcohol Abuse Sister. Arthritis Father, Mother, Sister. Bleeding disorder Sister. Breast Cancer Family Members In General. Cerebrovascular Accident Father. Cervical Cancer Sister. Depression Father, Mother, Sister. Diabetes Mellitus Family Members In General, Father,  Mother. Heart Disease Family Members In General, Father. Heart disease in female family member before age 99 Hypertension Brother, Father, Sister. Kidney Disease Family Members In General, Mother. Ovarian Cancer Sister. Respiratory Condition Mother. Thyroid problems Sister.  Pregnancy / Birth History Stacy Campos, [place]; 05/22/2017 11:33 AM) Age at menarche 73 years. Gravida 5 Irregular periods Length (months) of breastfeeding 12-24 Maternal age 48-25 Para 3  Other Problems Stacy Campos, [place]; 05/22/2017 11:33 AM) Arthritis Back Pain High blood pressure     Review of Systems Stacy Campos RMA; 05/22/2017 11:33 AM) General Present- Weight Loss. Not Present- Appetite Loss, Chills, Fatigue, Fever, Night Sweats and Weight Gain. Skin Present- Dryness, Hives, New Lesions, Non-Healing Wounds and Ulcer. Not Present- Change in Wart/Mole, Jaundice and Rash. HEENT Present- Seasonal Allergies, Sinus Pain and Visual Disturbances. Not Present- Earache, Hearing Loss, Hoarseness, Nose Bleed, Oral Ulcers, Ringing in the Ears, Sore Throat, Wears glasses/contact lenses and Yellow Eyes. Respiratory Present- Snoring. Not Present- Bloody sputum, Chronic Cough, Difficulty Breathing and Wheezing. Breast Present- Breast Mass, Breast Pain and Skin Changes. Not Present- Nipple Discharge. Cardiovascular Present- Rapid Heart Rate and Swelling of Extremities. Not Present- Chest Pain, Difficulty Breathing Lying Down, Leg Cramps, Palpitations and Shortness of Breath. Gastrointestinal Present- Change in Bowel Habits and Gets full quickly at meals. Not Present- Abdominal Pain, Bloating, Bloody Stool, Chronic diarrhea, Constipation, Difficulty Swallowing, Excessive gas, Hemorrhoids, Indigestion, Nausea, Rectal Pain and Vomiting. Female Genitourinary Present- Frequency and Urgency. Not Present- Nocturia, Painful Urination and Pelvic Pain. Musculoskeletal Present- Back Pain, Joint Pain, Joint  Stiffness, Muscle Pain, Muscle Weakness and Swelling of Extremities. Neurological Present- Headaches, Numbness, Tingling, Trouble walking and Weakness. Not Present- Decreased Memory, Fainting, Seizures and Tremor. Psychiatric Not Present- Anxiety, Bipolar, Change in Sleep Pattern, Depression, Fearful and Frequent crying. Endocrine Present- Cold Intolerance. Not Present- Excessive Hunger, Hair Changes, Heat Intolerance, Hot flashes and New Diabetes. Hematology Present- Persistent Infections. Not Present-  Blood Thinners, Easy Bruising, Excessive bleeding, Gland problems and HIV.  Vitals Stacy Campos RMA; 05/22/2017 11:37 AM) 05/22/2017 11:36 AM Weight: 289.2 lb Height: 67in Body Surface Area: 2.37 m Body Mass Index: 45.29 kg/m  Temp.: 97.34F  Pulse: 84 (Regular)  BP: 120/84 (Sitting, Left Arm, Standard)      Physical Exam (Tayana Shankle [name] MD; 05/22/2017 11:57 AM)  General Mental Status-Alert. General Appearance-Consistent with stated age. Hydration-Well hydrated. Voice-Normal.  Integumentary Note: Left inferior mammary crease is a 4 cm x 4 seminal region of induration. There is minimal fluctuance. It is red. Consistent with chronic hidradenitis this is in the lateral inferior quadrant region.   Head and Neck Head-normocephalic, atraumatic with no lesions or palpable masses. Trachea-midline. Thyroid Gland Characteristics - normal size and consistency.  Chest and Lung Exam Chest and lung exam reveals -quiet, even and easy respiratory effort with no use of accessory muscles and on auscultation, normal breath sounds, no adventitious sounds and normal vocal resonance. Inspection Chest Wall - Normal. Back - normal.  Cardiovascular Cardiovascular examination reveals -normal heart sounds, regular rate and rhythm with no murmurs and normal pedal pulses bilaterally.  Neurologic Neurologic evaluation reveals -alert and oriented x 3 with no  impairment of recent or remote memory. Mental Status-Normal.  Lymphatic Head & Neck  General Head & Neck Lymphatics: Bilateral - Description - Normal. Axillary  General Axillary Region: Bilateral - Description - Normal. Tenderness - Non Tender.    Assessment & Plan ([name] A. Franciscojavier Wronski MD; 05/22/2017 11:51 AM)  HIDRADENITIS (L73.2) Impression: left lateral inferior mammary crease  Patient has chronic hidradenitis involving her left inferior mammary crease toward the lower outer quadrant left breast. The area measures about 4 cm. There is still some cellulitis with this area but no obvious abscess. Her hidradenitis in both axilla appear stable and quiescent. Recommend excision of chronic left breast hidradenitis. Spelled draining off and on since the end of September and has improved marginally with antibiotics but recurs when she comes off antibiotics. Risk of bleeding, infection, need further surgery, cosmetic deformity, recurrence, and other areas of hidradenitis occurring were discussed with the patient.  Current Plans The anatomy and physiology of sweat glands and skin creases was discussed. Pathophysiology of hidradenitis suppurativa was discussed. I stressed good hygiene, avoiding razors and avoiding antiperspirants. Consideration of excision with primary closure versus need to place a drain, open packing, possible skin flaps/grafts was discussed.  Possibility of recurrence was discussed. Risks, benefits, alternatives were discussed. I noted a good likelihood this will help address the problem. Risks of anesthesia and other risks discussed. Questions answered. The patient is considering surgery. They wish to proceed.  You are being scheduled for surgery- Our schedulers will call you.  You should hear from our office's scheduling department within 5 working days about the location, date, and time of surgery. We try to make accommodations for patient's preferences in scheduling  surgery, but sometimes the OR schedule or the surgeon's schedule prevents [place] from making those accommodations.  If you have not heard from our office [phone]) in 5 working days, call the office and ask for your surgeon's nurse.  If you have other questions about your diagnosis, plan, or surgery, call the office and ask for your surgeon's nurse.  Pt Education - CCS Free Text Education/Instructions: discussed with patient and provided information. Started Bactrim DS 800-160MG , 1 (one) Tablet two times daily, #20, 05/22/2017, No Refill.

## 2017-05-22 NOTE — H&P (View-Only) (Signed)
Stacy Campos 05/22/2017 11:33 AM Location: [facility] Patient #: [id number] DOB: 1971/01/13 Separated / Language: Stacy Campos / Race: Black or African American Female  History of Present Illness [name] A. [name] MD; 05/22/2017 11:54 AM) Patient words: Patient presents for recurrent hidradenitis involving her left inferior breast. She has a history of bilateral axillary hidradenitis. She developed an area about 8 weeks ago involving the inferior portion of her left breast in the inferior mammary crease laterally. She's been on antibiotics and this is helped but it recurs once her antibiotics stopped. She complains of pain, redness, drainage from the area.  The patient is a 46 year old female.   Past Surgical History Stacy Campos, [place]; 05/22/2017 11:33 AM) Knee Surgery Left. Oral Surgery  Diagnostic Studies History Stacy Campos, [place]; 05/22/2017 11:33 AM) Colonoscopy never Mammogram within last year Pap Smear 1-5 years ago  Allergies Stacy Campos, RMA; 05/22/2017 11:33 AM) No Known Allergies 05/22/2017  Medication History Stacy Campos, RMA; 05/22/2017 11:36 AM) Metoprolol Succinate ER (25MG  Tablet ER 24HR, Oral) Active. Losartan Potassium (100MG  Tablet, Oral) Active. Ibuprofen (800MG  Tablet, Oral) Active. Aleve (220MG  Tablet, Oral) Active. Multivitamins (Oral) Active. Chlorthalidone (25MG  Tablet, Oral) Active. Medications Reconciled  Social History Stacy Campos, [place]; 05/22/2017 11:33 AM) Alcohol use Occasional alcohol use. Caffeine use Tea. No drug use Tobacco use Former smoker.  Family History Stacy Campos, [place]; 05/22/2017 11:33 AM) Alcohol Abuse Sister. Arthritis Father, Mother, Sister. Bleeding disorder Sister. Breast Cancer Family Members In General. Cerebrovascular Accident Father. Cervical Cancer Sister. Depression Father, Mother, Sister. Diabetes Mellitus Family Members In General, Father,  Mother. Heart Disease Family Members In General, Father. Heart disease in female family member before age 4 Hypertension Brother, Father, Sister. Kidney Disease Family Members In General, Mother. Ovarian Cancer Sister. Respiratory Condition Mother. Thyroid problems Sister.  Pregnancy / Birth History Stacy Campos, [place]; 05/22/2017 11:33 AM) Age at menarche 25 years. Gravida 5 Irregular periods Length (months) of breastfeeding 12-24 Maternal age 10-25 Para 3  Other Problems Stacy Campos, [place]; 05/22/2017 11:33 AM) Arthritis Back Pain High blood pressure     Review of Systems Stacy Campos RMA; 05/22/2017 11:33 AM) General Present- Weight Loss. Not Present- Appetite Loss, Chills, Fatigue, Fever, Night Sweats and Weight Gain. Skin Present- Dryness, Hives, New Lesions, Non-Healing Wounds and Ulcer. Not Present- Change in Wart/Mole, Jaundice and Rash. HEENT Present- Seasonal Allergies, Sinus Pain and Visual Disturbances. Not Present- Earache, Hearing Loss, Hoarseness, Nose Bleed, Oral Ulcers, Ringing in the Ears, Sore Throat, Wears glasses/contact lenses and Yellow Eyes. Respiratory Present- Snoring. Not Present- Bloody sputum, Chronic Cough, Difficulty Breathing and Wheezing. Breast Present- Breast Mass, Breast Pain and Skin Changes. Not Present- Nipple Discharge. Cardiovascular Present- Rapid Heart Rate and Swelling of Extremities. Not Present- Chest Pain, Difficulty Breathing Lying Down, Leg Cramps, Palpitations and Shortness of Breath. Gastrointestinal Present- Change in Bowel Habits and Gets full quickly at meals. Not Present- Abdominal Pain, Bloating, Bloody Stool, Chronic diarrhea, Constipation, Difficulty Swallowing, Excessive gas, Hemorrhoids, Indigestion, Nausea, Rectal Pain and Vomiting. Female Genitourinary Present- Frequency and Urgency. Not Present- Nocturia, Painful Urination and Pelvic Pain. Musculoskeletal Present- Back Pain, Joint Pain, Joint  Stiffness, Muscle Pain, Muscle Weakness and Swelling of Extremities. Neurological Present- Headaches, Numbness, Tingling, Trouble walking and Weakness. Not Present- Decreased Memory, Fainting, Seizures and Tremor. Psychiatric Not Present- Anxiety, Bipolar, Change in Sleep Pattern, Depression, Fearful and Frequent crying. Endocrine Present- Cold Intolerance. Not Present- Excessive Hunger, Hair Changes, Heat Intolerance, Hot flashes and New Diabetes. Hematology Present- Persistent Infections. Not Present-  Blood Thinners, Easy Bruising, Excessive bleeding, Gland problems and HIV.  Vitals Stacy Campos RMA; 05/22/2017 11:37 AM) 05/22/2017 11:36 AM Weight: 289.2 lb Height: 67in Body Surface Area: 2.37 m Body Mass Index: 45.29 kg/m  Temp.: 97.44F  Pulse: 84 (Regular)  BP: 120/84 (Sitting, Left Arm, Standard)      Physical Exam ([name] A. [name] MD; 05/22/2017 11:57 AM)  General Mental Status-Alert. General Appearance-Consistent with stated age. Hydration-Well hydrated. Voice-Normal.  Integumentary Note: Left inferior mammary crease is a 4 cm x 4 seminal region of induration. There is minimal fluctuance. It is red. Consistent with chronic hidradenitis this is in the lateral inferior quadrant region.   Head and Neck Head-normocephalic, atraumatic with no lesions or palpable masses. Trachea-midline. Thyroid Gland Characteristics - normal size and consistency.  Chest and Lung Exam Chest and lung exam reveals -quiet, even and easy respiratory effort with no use of accessory muscles and on auscultation, normal breath sounds, no adventitious sounds and normal vocal resonance. Inspection Chest Wall - Normal. Back - normal.  Cardiovascular Cardiovascular examination reveals -normal heart sounds, regular rate and rhythm with no murmurs and normal pedal pulses bilaterally.  Neurologic Neurologic evaluation reveals -alert and oriented x 3 with no  impairment of recent or remote memory. Mental Status-Normal.  Lymphatic Head & Neck  General Head & Neck Lymphatics: Bilateral - Description - Normal. Axillary  General Axillary Region: Bilateral - Description - Normal. Tenderness - Non Tender.    Assessment & Plan (Cing Canaan A. Leylani Duley MD; 05/22/2017 11:51 AM)  HIDRADENITIS (L73.2) Impression: left lateral inferior mammary crease  Patient has chronic hidradenitis involving her left inferior mammary crease toward the lower outer quadrant left breast. The area measures about 4 cm. There is still some cellulitis with this area but no obvious abscess. Her hidradenitis in both axilla appear stable and quiescent. Recommend excision of chronic left breast hidradenitis. Spelled draining off and on since the end of September and has improved marginally with antibiotics but recurs when she comes off antibiotics. Risk of bleeding, infection, need further surgery, cosmetic deformity, recurrence, and other areas of hidradenitis occurring were discussed with the patient.  Current Plans The anatomy and physiology of sweat glands and skin creases was discussed. Pathophysiology of hidradenitis suppurativa was discussed. I stressed good hygiene, avoiding razors and avoiding antiperspirants. Consideration of excision with primary closure versus need to place a drain, open packing, possible skin flaps/grafts was discussed.  Possibility of recurrence was discussed. Risks, benefits, alternatives were discussed. I noted a good likelihood this will help address the problem. Risks of anesthesia and other risks discussed. Questions answered. The patient is considering surgery. They wish to proceed.  You are being scheduled for surgery- Our schedulers will call you.  You should hear from our office's scheduling department within 5 working days about the location, date, and time of surgery. We try to make accommodations for patient's preferences in scheduling  surgery, but sometimes the OR schedule or the surgeon's schedule prevents [place] from making those accommodations.  If you have not heard from our office [phone]) in 5 working days, call the office and ask for your surgeon's nurse.  If you have other questions about your diagnosis, plan, or surgery, call the office and ask for your surgeon's nurse.  Pt Education - CCS Free Text Education/Instructions: discussed with patient and provided information. Started Bactrim DS 800-160MG , 1 (one) Tablet two times daily, #20, 05/22/2017, No Refill.

## 2017-05-27 ENCOUNTER — Encounter (HOSPITAL_BASED_OUTPATIENT_CLINIC_OR_DEPARTMENT_OTHER): Payer: Self-pay | Admitting: Emergency Medicine

## 2017-05-27 ENCOUNTER — Emergency Department (HOSPITAL_BASED_OUTPATIENT_CLINIC_OR_DEPARTMENT_OTHER)
Admission: EM | Admit: 2017-05-27 | Discharge: 2017-05-27 | Disposition: A | Discharge status: Home / Self Care | Payer: Medicaid Other | Attending: Emergency Medicine | Admitting: Emergency Medicine

## 2017-05-27 DIAGNOSIS — Z87891 Personal history of nicotine dependence: Secondary | ICD-10-CM | POA: Insufficient documentation

## 2017-05-27 DIAGNOSIS — Z79899 Other long term (current) drug therapy: Secondary | ICD-10-CM | POA: Diagnosis not present

## 2017-05-27 DIAGNOSIS — I10 Essential (primary) hypertension: Secondary | ICD-10-CM | POA: Insufficient documentation

## 2017-05-27 DIAGNOSIS — N3001 Acute cystitis with hematuria: Secondary | ICD-10-CM | POA: Diagnosis not present

## 2017-05-27 DIAGNOSIS — R319 Hematuria, unspecified: Secondary | ICD-10-CM | POA: Diagnosis present

## 2017-05-27 LAB — URINALYSIS, ROUTINE W REFLEX MICROSCOPIC
Bilirubin Urine: NEGATIVE
GLUCOSE, UA: NEGATIVE mg/dL
Ketones, ur: NEGATIVE mg/dL
Nitrite: POSITIVE — AB
PH: 6 (ref 5.0–8.0)
Protein, ur: 100 mg/dL — AB

## 2017-05-27 LAB — URINALYSIS, MICROSCOPIC (REFLEX)

## 2017-05-27 LAB — PREGNANCY, URINE: Preg Test, Ur: NEGATIVE

## 2017-05-27 MED ORDER — PHENAZOPYRIDINE HCL 100 MG PO TABS
200.0000 mg | ORAL_TABLET | Freq: Once | ORAL | Status: AC
  Administered 2017-05-27: 200 mg via ORAL
  Filled 2017-05-27: qty 2

## 2017-05-27 MED ORDER — PHENAZOPYRIDINE HCL 200 MG PO TABS: 200.0000 mg | ORAL_TABLET | Freq: Three times a day (TID) | ORAL | 0 refills | Status: DC

## 2017-05-27 MED ORDER — CEPHALEXIN 500 MG PO CAPS: 500.0000 mg | ORAL_CAPSULE | Freq: Four times a day (QID) | ORAL | 0 refills | Status: DC

## 2017-05-27 MED ORDER — CEPHALEXIN 250 MG PO CAPS
500.0000 mg | ORAL_CAPSULE | Freq: Once | ORAL | Status: AC
  Administered 2017-05-27: 500 mg via ORAL
  Filled 2017-05-27: qty 2

## 2017-05-27 NOTE — ED Triage Notes (Signed)
PT presents with c/o hematuria that started today. And pain after she urinates

## 2017-05-27 NOTE — ED Notes (Signed)
Alert, NAD, calm, interactive, resps e/u, speaking in clear complete sentences, no dyspnea noted, skin W&D, VSS, c/o dysuria, hematuria, stong malodorous urine, "feels like a UTI", sx onset this am, mentions chronic low back pain for months, also mentions some red/brown vaginal d/c(No CVA tenderness or flank pain noted/reported), mentions some diarrhea since starting bactrim for breast abscess (day 5 of 10), no relief with cranberry juice or aleve (denies: fever, flank pain, sob, NV, dizziness or visual changes), EDP into room. Family at [facility].

## 2017-05-27 NOTE — ED Notes (Signed)
Pt not in room, pt in b/r.  

## 2017-05-27 NOTE — ED Provider Notes (Signed)
[facility] DEPARTMENT Provider Note   CSN: [id number] Arrival date & time: 05/27/17  2044     History   Chief Complaint Chief Complaint  Patient presents with  . Hematuria    HPI Stacy Campos is a 46 y.o. female.  Patient c/o dark urine, pain post urinating for past day. Symptoms episodic, moderate, persistent. Denies back or flank pain. No abd pain. Denies fever or chills. Pt states post menopausal, no periods x years. Denies vaginal bleeding or discharge.    The history is provided by the patient.  Hematuria     Past Medical History:  Diagnosis Date  . Arthritis    "knees, back" (02/03/2017)  . Chronic lower back pain   . Daily headache    "related to my BP" (02/03/2017)  . Elevated blood pressure reading without diagnosis of hypertension   . Hidradenitis    "chronic; been ongoing for > 1 yr" (02/03/2017)  . Hypertension   . Scoliosis     Patient Active Problem List   Diagnosis Date Noted  . Mastitis, left, acute 02/17/2017  . Breast pain, left   . Left axillary pain 02/03/2017  . Atypical chest pain 09/23/2016  . Left knee pain 06/23/2016  . Blurry vision 06/08/2016  . Carpal tunnel syndrome 05/17/2016  . Hydradenitis 12/07/2015  . HTN (hypertension) 01/07/2010    Past Surgical History:  Procedure Laterality Date  . DILATION AND CURETTAGE OF UTERUS    . KNEE ARTHROSCOPY Bilateral 1992    OB History    Gravida Para Term Preterm AB Living   5 3 1 2 2 3    SAB TAB Ectopic Multiple Live Births   2               Home Medications    Prior to Admission medications   Medication Sig Start Date End Date Taking? Authorizing Provider  acetaminophen (TYLENOL) 500 MG tablet Take 1,500 mg by mouth daily as needed (pain).     [provider]  chlorthalidone (HYGROTON) 25 MG tablet Take 1 tablet (25 mg total) by mouth daily as needed. For leg edema or fluid retention 03/09/17   Lorretta Harp, MD  losartan (COZAAR) 100 MG tablet  Take 1 tablet (100 mg total) by mouth daily. 02/08/17 05/09/17  Lorretta Harp, MD  metoprolol succinate (TOPROL-XL) 50 MG 24 hr tablet Take 1 tablet (50 mg total) by mouth every evening. Take with or immediately following a meal. 04/12/17   Lorretta Harp, MD  Multiple Vitamin (MULTIVITAMIN WITH MINERALS) TABS tablet Take 1 tablet by mouth daily.    [provider]  naproxen sodium (ALEVE) 220 MG tablet Take 220 mg by mouth every 12 (twelve) hours.    [provider]  sulfamethoxazole-trimethoprim (BACTRIM DS,SEPTRA DS) 800-160 MG tablet Take 2 tablets by mouth 2 (two) times daily. 05/03/17   Smiley Houseman, MD    Family History Family History  Problem Relation Age of Onset  . Arthritis Mother   . Rheum arthritis Mother   . Lung disease Mother        interstitial 2/2 RA, rheumatic heart disease  . Stroke Father   . Hypertension Father   . Diabetes Father   . Heart disease Father   . Hyperlipidemia Father   . Stroke Maternal Grandmother   . Diabetes Maternal Grandmother   . Heart disease Maternal Grandfather   . Diabetes Paternal Grandmother   . Diabetes Paternal Grandfather   . Heart  disease Paternal Grandfather   . Cancer Sister        UTERINE- SPREAD TO LUNGS/SPLEEN/LIVER  . Breast cancer Paternal Aunt     Social History Social History  Substance Use Topics  . Smoking status: Former Smoker    Years: 2.00    Types: Cigarettes    Quit date: 68  . Smokeless tobacco: Never Used  . Alcohol use 0.0 oz/week     Comment: 02/03/2017 "I'll drink on big events; maybe 3 times/year"     Allergies   Dimetapp [albertsons di bromm]   Review of Systems Review of Systems  Constitutional: Negative for chills and fever.  Gastrointestinal: Negative for blood in stool and vomiting.  Genitourinary: Positive for hematuria. Negative for vaginal bleeding and vaginal discharge.  Musculoskeletal: Negative for back pain.     Physical Exam Updated Vital  Signs BP (!) 140/99 (BP Location: Left Arm)   Pulse 100   Temp 98.6 F (37 C) (Oral)   Resp 20   SpO2 100%   Physical Exam  Constitutional: She appears well-developed and well-nourished. No distress.  HENT:  Head: Atraumatic.  Eyes: Conjunctivae are normal. No scleral icterus.  Neck: Neck supple. No tracheal deviation present.  Cardiovascular: Normal rate.   Pulmonary/Chest: Effort normal. No respiratory distress.  Abdominal: Soft. Normal appearance and bowel sounds are normal. She exhibits no distension and no mass. There is no tenderness. There is no rebound and no guarding. No hernia.  Genitourinary:  Genitourinary Comments: No cva tenderness  Musculoskeletal: She exhibits no edema.  Neurological: She is alert.  Skin: Skin is warm and dry. No rash noted. She is not diaphoretic.  Psychiatric: She has a normal mood and affect.  Nursing note and vitals reviewed.    ED Treatments / Results  Labs (all labs ordered are listed, but only abnormal results are displayed) Results for orders placed or performed during the hospital encounter of 05/27/17  Urinalysis, Routine w reflex microscopic  Result Value Ref Range   Color, Urine YELLOW YELLOW   APPearance CLOUDY (A) CLEAR   Specific Gravity, Urine >1.030 (H) 1.005 - 1.030   pH 6.0 5.0 - 8.0   Glucose, UA NEGATIVE NEGATIVE mg/dL   Hgb urine dipstick LARGE (A) NEGATIVE   Bilirubin Urine NEGATIVE NEGATIVE   Ketones, ur NEGATIVE NEGATIVE mg/dL   Protein, ur 100 (A) NEGATIVE mg/dL   Nitrite POSITIVE (A) NEGATIVE   Leukocytes, UA MODERATE (A) NEGATIVE  Pregnancy, urine  Result Value Ref Range   Preg Test, Ur NEGATIVE NEGATIVE  Urinalysis, Microscopic (reflex)  Result Value Ref Range   RBC / HPF TOO NUMEROUS TO COUNT 0 - 5 RBC/hpf   WBC, UA TOO NUMEROUS TO COUNT 0 - 5 WBC/hpf   Bacteria, UA MANY (A) NONE SEEN   Squamous Epithelial / LPF 6-30 (A) NONE SEEN    EKG  EKG Interpretation None       Radiology No results  found.  Procedures Procedures (including critical care time)  Medications Ordered in ED Medications - No data to display   Initial Impression / Assessment and Plan / ED Course  I have reviewed the triage vital signs and the nursing notes.  Pertinent labs & imaging results that were available during my care of the patient were reviewed by me and considered in my medical decision making (see chart for details).  Labs sent.   Reviewed nursing notes and prior charts for additional history.   upreg neg. uti on ua.  Keflex  po.  Pyridium po.  Pt appears stable for d/c.     Final Clinical Impressions(s) / ED Diagnoses   Final diagnoses:  None    New Prescriptions New Prescriptions   No medications on file     Lajean Saver, MD 05/27/17 2223

## 2017-05-27 NOTE — Discharge Instructions (Signed)
It was our pleasure to provide your ER care today - we hope that you feel better.  Take antibiotic (keflex) as prescribed. Drink plenty of fluids.  Take pyridium as need for bladder spasm/pain.   Follow up with primary care doctor in 1 week if symptoms fail to improve/resolve.   Your blood pressure is high tonight - follow up with primary care doctor for recheck in the next 1-2 weeks.   Return to ER if worse, severe abdominal pain, persistent vomiting, other concern.

## 2017-05-27 NOTE — ED Notes (Signed)
Pt given d/c instructions as per chart. rx x 2. Verbalizes understanding. No questions.

## 2017-05-31 ENCOUNTER — Encounter (HOSPITAL_BASED_OUTPATIENT_CLINIC_OR_DEPARTMENT_OTHER): Payer: Self-pay | Admitting: *Deleted

## 2017-05-31 ENCOUNTER — Other Ambulatory Visit: Payer: Self-pay

## 2017-06-02 ENCOUNTER — Encounter (HOSPITAL_BASED_OUTPATIENT_CLINIC_OR_DEPARTMENT_OTHER)
Admission: RE | Admit: 2017-06-02 | Discharge: 2017-06-02 | Disposition: A | Discharge status: Home / Self Care | Payer: Medicaid Other | Source: Ambulatory Visit | Attending: Surgery | Admitting: Surgery

## 2017-06-02 DIAGNOSIS — L732 Hidradenitis suppurativa: Secondary | ICD-10-CM | POA: Diagnosis not present

## 2017-06-02 DIAGNOSIS — Z01818 Encounter for other preprocedural examination: Secondary | ICD-10-CM | POA: Diagnosis present

## 2017-06-02 LAB — COMPREHENSIVE METABOLIC PANEL
ALK PHOS: 77 U/L (ref 38–126)
ALT: 21 U/L (ref 14–54)
AST: 22 U/L (ref 15–41)
Albumin: 3.9 g/dL (ref 3.5–5.0)
Anion gap: 7 (ref 5–15)
BILIRUBIN TOTAL: 0.6 mg/dL (ref 0.3–1.2)
BUN: 9 mg/dL (ref 6–20)
CALCIUM: 9.1 mg/dL (ref 8.9–10.3)
CO2: 26 mmol/L (ref 22–32)
CREATININE: 0.92 mg/dL (ref 0.44–1.00)
Chloride: 104 mmol/L (ref 101–111)
Glucose, Bld: 112 mg/dL — ABNORMAL HIGH (ref 65–99)
Potassium: 4.6 mmol/L (ref 3.5–5.1)
Sodium: 137 mmol/L (ref 135–145)
TOTAL PROTEIN: 8.2 g/dL — AB (ref 6.5–8.1)

## 2017-06-02 LAB — CBC WITH DIFFERENTIAL/PLATELET
Basophils Absolute: 0 10*3/uL (ref 0.0–0.1)
Basophils Relative: 0 %
EOS PCT: 3 %
Eosinophils Absolute: 0.2 10*3/uL (ref 0.0–0.7)
HCT: 41.1 % (ref 36.0–46.0)
HEMOGLOBIN: 13.4 g/dL (ref 12.0–15.0)
LYMPHS ABS: 3.3 10*3/uL (ref 0.7–4.0)
LYMPHS PCT: 46 %
MCH: 27.5 pg (ref 26.0–34.0)
MCHC: 32.6 g/dL (ref 30.0–36.0)
MCV: 84.2 fL (ref 78.0–100.0)
MONOS PCT: 3 %
Monocytes Absolute: 0.3 10*3/uL (ref 0.1–1.0)
NEUTROS PCT: 48 %
Neutro Abs: 3.5 10*3/uL (ref 1.7–7.7)
Platelets: 277 10*3/uL (ref 150–400)
RBC: 4.88 MIL/uL (ref 3.87–5.11)
RDW: 13.8 % (ref 11.5–15.5)
WBC: 7.3 10*3/uL (ref 4.0–10.5)

## 2017-06-02 NOTE — Progress Notes (Signed)
Pt given Ensure drink with specific instructions.  Pt verbalized understanding.  Dr Gifford Shave at bedside performed anesthesia consult/ OK to proceed with surgery.

## 2017-06-07 ENCOUNTER — Ambulatory Visit (HOSPITAL_BASED_OUTPATIENT_CLINIC_OR_DEPARTMENT_OTHER): Payer: Medicaid Other | Admitting: Certified Registered"

## 2017-06-07 ENCOUNTER — Other Ambulatory Visit: Payer: Self-pay

## 2017-06-07 ENCOUNTER — Encounter (HOSPITAL_BASED_OUTPATIENT_CLINIC_OR_DEPARTMENT_OTHER)
Admission: RE | Disposition: A | Discharge status: Home / Self Care | Payer: Self-pay | Source: Ambulatory Visit | Attending: Surgery

## 2017-06-07 ENCOUNTER — Encounter (HOSPITAL_BASED_OUTPATIENT_CLINIC_OR_DEPARTMENT_OTHER): Payer: Self-pay

## 2017-06-07 ENCOUNTER — Ambulatory Visit (HOSPITAL_BASED_OUTPATIENT_CLINIC_OR_DEPARTMENT_OTHER)
Admission: RE | Admit: 2017-06-07 | Discharge: 2017-06-07 | Disposition: A | Discharge status: Home / Self Care | Payer: Medicaid Other | Source: Ambulatory Visit | Attending: Surgery | Admitting: Surgery

## 2017-06-07 DIAGNOSIS — Z6841 Body Mass Index (BMI) 40.0 and over, adult: Secondary | ICD-10-CM | POA: Diagnosis not present

## 2017-06-07 DIAGNOSIS — Z87891 Personal history of nicotine dependence: Secondary | ICD-10-CM | POA: Diagnosis not present

## 2017-06-07 DIAGNOSIS — L03112 Cellulitis of left axilla: Secondary | ICD-10-CM | POA: Diagnosis not present

## 2017-06-07 DIAGNOSIS — M1990 Unspecified osteoarthritis, unspecified site: Secondary | ICD-10-CM | POA: Insufficient documentation

## 2017-06-07 DIAGNOSIS — Z79899 Other long term (current) drug therapy: Secondary | ICD-10-CM | POA: Diagnosis not present

## 2017-06-07 DIAGNOSIS — L732 Hidradenitis suppurativa: Secondary | ICD-10-CM | POA: Insufficient documentation

## 2017-06-07 DIAGNOSIS — Z888 Allergy status to other drugs, medicaments and biological substances status: Secondary | ICD-10-CM | POA: Insufficient documentation

## 2017-06-07 DIAGNOSIS — I10 Essential (primary) hypertension: Secondary | ICD-10-CM | POA: Diagnosis not present

## 2017-06-07 HISTORY — PX: HYDRADENITIS EXCISION: SHX5243

## 2017-06-07 SURGERY — EXCISION, HIDRADENITIS, AXILLA
Anesthesia: General | Site: Breast | Laterality: Left

## 2017-06-07 MED ORDER — DIPHENHYDRAMINE HCL 50 MG/ML IJ SOLN
12.5000 mg | Freq: Four times a day (QID) | INTRAMUSCULAR | Status: DC | PRN
  Administered 2017-06-07: 12.5 mg via INTRAVENOUS

## 2017-06-07 MED ORDER — ONDANSETRON HCL 4 MG/2ML IJ SOLN
INTRAMUSCULAR | Status: DC | PRN
  Administered 2017-06-07: 4 mg via INTRAVENOUS

## 2017-06-07 MED ORDER — PROPOFOL 10 MG/ML IV BOLUS
INTRAVENOUS | Status: AC
  Filled 2017-06-07: qty 20

## 2017-06-07 MED ORDER — MIDAZOLAM HCL 2 MG/2ML IJ SOLN
INTRAMUSCULAR | Status: AC
  Filled 2017-06-07: qty 2

## 2017-06-07 MED ORDER — GABAPENTIN 300 MG PO CAPS
300.0000 mg | ORAL_CAPSULE | ORAL | Status: AC
  Administered 2017-06-07: 300 mg via ORAL

## 2017-06-07 MED ORDER — ROCURONIUM BROMIDE 10 MG/ML (PF) SYRINGE
PREFILLED_SYRINGE | INTRAVENOUS | Status: AC
  Filled 2017-06-07: qty 5

## 2017-06-07 MED ORDER — HYDROMORPHONE HCL 1 MG/ML IJ SOLN: 0.2500 mg | INTRAMUSCULAR | Status: DC | PRN

## 2017-06-07 MED ORDER — EPHEDRINE SULFATE 50 MG/ML IJ SOLN
INTRAMUSCULAR | Status: DC | PRN
  Administered 2017-06-07 (×2): 10 mg via INTRAVENOUS

## 2017-06-07 MED ORDER — SCOPOLAMINE 1 MG/3DAYS TD PT72: 1.0000 | MEDICATED_PATCH | Freq: Once | TRANSDERMAL | Status: DC | PRN

## 2017-06-07 MED ORDER — PHENYLEPHRINE HCL 10 MG/ML IJ SOLN
INTRAMUSCULAR | Status: DC | PRN
  Administered 2017-06-07: 120 ug via INTRAVENOUS

## 2017-06-07 MED ORDER — PROPOFOL 10 MG/ML IV BOLUS
INTRAVENOUS | Status: AC
  Filled 2017-06-07: qty 40

## 2017-06-07 MED ORDER — DEXAMETHASONE SODIUM PHOSPHATE 10 MG/ML IJ SOLN
INTRAMUSCULAR | Status: AC
  Filled 2017-06-07: qty 1

## 2017-06-07 MED ORDER — CELECOXIB 200 MG PO CAPS
200.0000 mg | ORAL_CAPSULE | ORAL | Status: AC
  Administered 2017-06-07: 200 mg via ORAL

## 2017-06-07 MED ORDER — FENTANYL CITRATE (PF) 100 MCG/2ML IJ SOLN
INTRAMUSCULAR | Status: AC
  Filled 2017-06-07: qty 2

## 2017-06-07 MED ORDER — DEXTROSE 5 % IV SOLN
3.0000 g | INTRAVENOUS | Status: AC
  Administered 2017-06-07: 3 g via INTRAVENOUS

## 2017-06-07 MED ORDER — ONDANSETRON HCL 4 MG/2ML IJ SOLN
INTRAMUSCULAR | Status: AC
  Filled 2017-06-07: qty 2

## 2017-06-07 MED ORDER — ACETAMINOPHEN 500 MG PO TABS
1000.0000 mg | ORAL_TABLET | ORAL | Status: AC
  Administered 2017-06-07: 1000 mg via ORAL

## 2017-06-07 MED ORDER — CEFAZOLIN SODIUM-DEXTROSE 1-4 GM/50ML-% IV SOLN
INTRAVENOUS | Status: AC
  Filled 2017-06-07: qty 50

## 2017-06-07 MED ORDER — ACETAMINOPHEN 500 MG PO TABS
ORAL_TABLET | ORAL | Status: AC
Start: 2017-06-07 — End: 2017-06-07
  Filled 2017-06-07: qty 2

## 2017-06-07 MED ORDER — CHLORHEXIDINE GLUCONATE CLOTH 2 % EX PADS: 6.0000 | MEDICATED_PAD | Freq: Once | CUTANEOUS | Status: DC

## 2017-06-07 MED ORDER — MIDAZOLAM HCL 2 MG/2ML IJ SOLN: 1.0000 mg | INTRAMUSCULAR | Status: DC | PRN

## 2017-06-07 MED ORDER — SUCCINYLCHOLINE CHLORIDE 20 MG/ML IJ SOLN
INTRAMUSCULAR | Status: DC | PRN
  Administered 2017-06-07: 120 mg via INTRAVENOUS

## 2017-06-07 MED ORDER — LIDOCAINE HCL (CARDIAC) 20 MG/ML IV SOLN
INTRAVENOUS | Status: DC | PRN
  Administered 2017-06-07: 100 mg via INTRAVENOUS

## 2017-06-07 MED ORDER — FENTANYL CITRATE (PF) 100 MCG/2ML IJ SOLN
50.0000 ug | INTRAMUSCULAR | Status: AC | PRN
  Administered 2017-06-07: 100 ug via INTRAVENOUS
  Administered 2017-06-07: 50 ug via INTRAVENOUS
  Administered 2017-06-07: 25 ug via INTRAVENOUS

## 2017-06-07 MED ORDER — BUPIVACAINE-EPINEPHRINE (PF) 0.25% -1:200000 IJ SOLN
INTRAMUSCULAR | Status: AC
  Filled 2017-06-07: qty 30

## 2017-06-07 MED ORDER — GABAPENTIN 300 MG PO CAPS
ORAL_CAPSULE | ORAL | Status: AC
  Filled 2017-06-07: qty 1

## 2017-06-07 MED ORDER — NEOSTIGMINE METHYLSULFATE 5 MG/5ML IV SOSY
PREFILLED_SYRINGE | INTRAVENOUS | Status: AC
  Filled 2017-06-07: qty 5

## 2017-06-07 MED ORDER — HYDROCODONE-ACETAMINOPHEN 5-325 MG PO TABS: 1.0000 | ORAL_TABLET | Freq: Four times a day (QID) | ORAL | 0 refills | Status: DC | PRN

## 2017-06-07 MED ORDER — DIPHENHYDRAMINE HCL 50 MG/ML IJ SOLN
INTRAMUSCULAR | Status: AC
  Filled 2017-06-07: qty 1

## 2017-06-07 MED ORDER — LACTATED RINGERS IV SOLN
INTRAVENOUS | Status: DC
  Administered 2017-06-07: 12:00:00 via INTRAVENOUS

## 2017-06-07 MED ORDER — CEFAZOLIN SODIUM-DEXTROSE 2-4 GM/100ML-% IV SOLN
INTRAVENOUS | Status: AC
Start: 2017-06-07 — End: 2017-06-07
  Filled 2017-06-07: qty 100

## 2017-06-07 MED ORDER — PROPOFOL 10 MG/ML IV BOLUS
INTRAVENOUS | Status: DC | PRN
  Administered 2017-06-07: 200 mg via INTRAVENOUS

## 2017-06-07 MED ORDER — BUPIVACAINE-EPINEPHRINE 0.25% -1:200000 IJ SOLN
INTRAMUSCULAR | Status: DC | PRN
  Administered 2017-06-07: 7 mL

## 2017-06-07 MED ORDER — KETOROLAC TROMETHAMINE 30 MG/ML IJ SOLN
INTRAMUSCULAR | Status: DC | PRN
  Administered 2017-06-07: 30 mg via INTRAVENOUS

## 2017-06-07 MED ORDER — CELECOXIB 200 MG PO CAPS
ORAL_CAPSULE | ORAL | Status: AC
  Filled 2017-06-07: qty 1

## 2017-06-07 MED ORDER — LIDOCAINE 2% (20 MG/ML) 5 ML SYRINGE
INTRAMUSCULAR | Status: AC
  Filled 2017-06-07: qty 5

## 2017-06-07 MED ORDER — DEXAMETHASONE SODIUM PHOSPHATE 4 MG/ML IJ SOLN
INTRAMUSCULAR | Status: DC | PRN
  Administered 2017-06-07: 10 mg via INTRAVENOUS

## 2017-06-07 MED ORDER — PROMETHAZINE HCL 25 MG/ML IJ SOLN: 6.2500 mg | INTRAMUSCULAR | Status: DC | PRN

## 2017-06-07 SURGICAL SUPPLY — 40 items
ADH SKN CLS APL DERMABOND .7 (GAUZE/BANDAGES/DRESSINGS) ×1
BLADE SURG 15 STRL LF DISP TIS (BLADE) ×1 IMPLANT
BLADE SURG 15 STRL SS (BLADE) ×3
CANISTER SUCT 1200ML W/VALVE (MISCELLANEOUS) ×3 IMPLANT
CHLORAPREP W/TINT 26ML (MISCELLANEOUS) ×3 IMPLANT
COVER BACK TABLE 60X90IN (DRAPES) ×3 IMPLANT
COVER MAYO STAND STRL (DRAPES) ×3 IMPLANT
DECANTER SPIKE VIAL GLASS SM (MISCELLANEOUS) ×3 IMPLANT
DERMABOND ADVANCED (GAUZE/BANDAGES/DRESSINGS) ×2
DERMABOND ADVANCED .7 DNX12 (GAUZE/BANDAGES/DRESSINGS) IMPLANT
DRAPE LAPAROSCOPIC ABDOMINAL (DRAPES) ×2 IMPLANT
DRAPE LAPAROTOMY 100X72 PEDS (DRAPES) ×3 IMPLANT
DRAPE UTILITY XL STRL (DRAPES) ×3 IMPLANT
ELECT COATED BLADE 2.86 ST (ELECTRODE) ×3 IMPLANT
ELECT REM PT RETURN 9FT ADLT (ELECTROSURGICAL) ×3
ELECTRODE REM PT RTRN 9FT ADLT (ELECTROSURGICAL) ×1 IMPLANT
GLOVE BIOGEL PI IND STRL 6.5 (GLOVE) IMPLANT
GLOVE BIOGEL PI IND STRL 8 (GLOVE) ×1 IMPLANT
GLOVE BIOGEL PI INDICATOR 6.5 (GLOVE) ×2
GLOVE BIOGEL PI INDICATOR 8 (GLOVE) ×2
GLOVE ECLIPSE 8.0 STRL XLNG CF (GLOVE) ×3 IMPLANT
GLOVE SURG SS PI 6.5 STRL IVOR (GLOVE) ×2 IMPLANT
GOWN STRL REUS W/ TWL LRG LVL3 (GOWN DISPOSABLE) ×2 IMPLANT
GOWN STRL REUS W/TWL LRG LVL3 (GOWN DISPOSABLE) ×6
NDL HYPO 25X1 1.5 SAFETY (NEEDLE) ×1 IMPLANT
NEEDLE HYPO 25X1 1.5 SAFETY (NEEDLE) ×3 IMPLANT
NS IRRIG 1000ML POUR BTL (IV SOLUTION) ×3 IMPLANT
PACK BASIN DAY SURGERY FS (CUSTOM PROCEDURE TRAY) ×3 IMPLANT
PENCIL BUTTON HOLSTER BLD 10FT (ELECTRODE) ×3 IMPLANT
SLEEVE SCD COMPRESS KNEE MED (MISCELLANEOUS) ×3 IMPLANT
SPONGE LAP 4X18 X RAY DECT (DISPOSABLE) ×3 IMPLANT
STAPLER VISISTAT 35W (STAPLE) IMPLANT
SUT MON AB 4-0 PC3 18 (SUTURE) ×3 IMPLANT
SUT VICRYL 3-0 CR8 SH (SUTURE) ×3 IMPLANT
SYR CONTROL 10ML LL (SYRINGE) ×3 IMPLANT
TOWEL OR 17X24 6PK STRL BLUE (TOWEL DISPOSABLE) ×6 IMPLANT
TOWEL OR NON WOVEN STRL DISP B (DISPOSABLE) ×3 IMPLANT
TUBE CONNECTING 20'X1/4 (TUBING) ×1
TUBE CONNECTING 20X1/4 (TUBING) ×2 IMPLANT
YANKAUER SUCT BULB TIP NO VENT (SUCTIONS) ×3 IMPLANT

## 2017-06-07 NOTE — Interval H&P Note (Signed)
History and Physical Interval Note:  06/07/2017 12:16 PM  Stacy Campos  has presented today for surgery, with the diagnosis of LEFT BREAST HIDRADENITIS  The various methods of treatment have been discussed with the patient and family. After consideration of risks, benefits and other options for treatment, the patient has consented to  Procedure(s): EXCISION HIDRADENITIS OF LEFT BREAST (Left) as a surgical intervention .  The patient's history has been reviewed, patient examined, no change in status, stable for surgery.  I have reviewed the patient's chart and labs.  Questions were answered to the patient's satisfaction.     Mairlyn Tegtmeyer A.

## 2017-06-07 NOTE — Anesthesia Postprocedure Evaluation (Signed)
Anesthesia Post Note  Patient: Stacy Campos  Procedure(s) Performed: EXCISION HIDRADENITIS OF LEFT BREAST (Left Breast)     Patient location during evaluation: PACU Anesthesia Type: General Level of consciousness: awake and alert Pain management: pain level controlled Vital Signs Assessment: post-procedure vital signs reviewed and stable Respiratory status: spontaneous breathing, nonlabored ventilation, respiratory function stable and patient connected to nasal cannula oxygen Cardiovascular status: blood pressure returned to baseline and stable Postop Assessment: no apparent nausea or vomiting Anesthetic complications: no    Last Vitals:  Vitals:   06/07/17 1500 06/07/17 1545  BP: 126/83 119/71  Pulse: 89 94  Resp: 15 16  Temp:  36.5 C  SpO2: 95% 98%    Last Pain:  Vitals:   06/07/17 1545  TempSrc:   PainSc: 0-No pain                 [name],JAMES TERRILL

## 2017-06-07 NOTE — Transfer of Care (Signed)
Immediate Anesthesia Transfer of Care Note  Patient: Stacy Campos  Procedure(s) Performed: EXCISION HIDRADENITIS OF LEFT BREAST (Left Breast)  Patient Location: PACU  Anesthesia Type:General  Level of Consciousness: awake, alert  and oriented  Airway & Oxygen Therapy: Patient Spontanous Breathing and Patient connected to face mask oxygen  Post-op Assessment: postop vs stable  Post vital signs: Reviewed and stable  Last Vitals:  Vitals:   06/07/17 1149 06/07/17 1333  BP: 129/80 136/78  Pulse: 93 (P) 95  Resp: 20 (P) 18  Temp: 36.8 C   SpO2: 99% (P) 100%    Last Pain:  Vitals:   06/07/17 1149  TempSrc: Oral  PainSc: 7       Patients Stated Pain Goal: 2 (50/93/26 7124)  Complications: No apparent anesthesia complications

## 2017-06-07 NOTE — Discharge Instructions (Signed)
GENERAL SURGERY: POST OP INSTRUCTIONS ° °###################################################################### ° °EAT °Gradually transition to a high fiber diet with a fiber supplement over the next few weeks after discharge.  Start with a pureed / full liquid diet (see below) ° °WALK °Walk an hour a day.  Control your pain to do that.   ° °CONTROL PAIN °Control pain so that you can walk, sleep, tolerate sneezing/coughing, go up/down stairs. ° °HAVE A BOWEL MOVEMENT DAILY °Keep your bowels regular to avoid problems.  OK to try a laxative to override constipation.  OK to use an antidairrheal to slow down diarrhea.  Call if not better after 2 tries ° °CALL IF YOU HAVE PROBLEMS/CONCERNS °Call if you are still struggling despite following these instructions. °Call if you have concerns not answered by these instructions ° °###################################################################### ° ° ° °1. DIET: Follow a light bland diet the first 24 hours after arrival home, such as soup, liquids, crackers, etc.  Be sure to include lots of fluids daily.  Avoid fast food or heavy meals as your are more likely to get nauseated.   °2. Take your usually prescribed home medications unless otherwise directed. °3. PAIN CONTROL: °a. Pain is best controlled by a usual combination of three different methods TOGETHER: °i. Ice/Heat °ii. Over the counter pain medication °iii. Prescription pain medication °b. Most patients will experience some swelling and bruising around the incisions.  Ice packs or heating pads (30-60 minutes up to 6 times a day) will help. Use ice for the first few days to help decrease swelling and bruising, then switch to heat to help relax tight/sore spots and speed recovery.  Some people prefer to use ice alone, heat alone, alternating between ice & heat.  Experiment to what works for you.  Swelling and bruising can take several weeks to resolve.   °c. It is helpful to take an over-the-counter pain medication  regularly for the first few weeks.  Choose one of the following that works best for you: °i. Naproxen (Aleve, etc)  Two 220mg tabs twice a day °ii. Ibuprofen (Advil, etc) Three 200mg tabs four times a day (every meal & bedtime) °iii. Acetaminophen (Tylenol, etc) 500-650mg four times a day (every meal & bedtime) °d. A  prescription for pain medication (such as oxycodone, hydrocodone, etc) should be given to you upon discharge.  Take your pain medication as prescribed.  °i. If you are having problems/concerns with the prescription medicine (does not control pain, nausea, vomiting, rash, itching, etc), please call us [phone] to see if we need to switch you to a different pain medicine that will work better for you and/or control your side effect better. °ii. If you need a refill on your pain medication, please contact your pharmacy.  They will contact our office to request authorization. Prescriptions will not be filled after 5 pm or on week-ends. °4. Avoid getting constipated.  Between the surgery and the pain medications, it is common to experience some constipation.  Increasing fluid intake and taking a fiber supplement (such as Metamucil, Citrucel, FiberCon, MiraLax, etc) 1-2 times a day regularly will usually help prevent this problem from occurring.  A mild laxative (prune juice, Milk of Magnesia, MiraLax, etc) should be taken according to package directions if there are no bowel movements after 48 hours.   °5. Wash / shower every day.  You may shower over the dressings as they are waterproof.  Continue to shower over incision(s) after the dressing is off. °6. Remove your waterproof bandages   5 days after surgery.  You may leave the incision open to air.  You may have skin tapes (Steri Strips) covering the incision(s).  Leave them on until one week, then remove.  You may replace a dressing/Band-Aid to cover the incision for comfort if you wish.  ° ° ° ° °7. ACTIVITIES as tolerated:   °a. You may resume  regular (light) daily activities beginning the next day--such as daily self-care, walking, climbing stairs--gradually increasing activities as tolerated.  If you can walk 30 minutes without difficulty, it is safe to try more intense activity such as jogging, treadmill, bicycling, low-impact aerobics, swimming, etc. °b. Save the most intensive and strenuous activity for last such as sit-ups, heavy lifting, contact sports, etc  Refrain from any heavy lifting or straining until you are off narcotics for pain control.   °c. DO NOT PUSH THROUGH PAIN.  Let pain be your guide: If it hurts to do something, don't do it.  Pain is your body warning you to avoid that activity for another week until the pain goes down. °d. You may drive when you are no longer taking prescription pain medication, you can comfortably wear a seatbelt, and you can safely maneuver your car and apply brakes. °e. You may have sexual intercourse when it is comfortable.  °8. FOLLOW UP in our office °a. Please call [facility] at [phone] to set up an appointment to see your surgeon in the office for a follow-up appointment approximately 2-3 weeks after your surgery. °b. Make sure that you call for this appointment the day you arrive home to insure a convenient appointment time. °9. IF YOU HAVE DISABILITY OR FAMILY LEAVE FORMS, BRING THEM TO THE OFFICE FOR PROCESSING.  DO NOT GIVE THEM TO YOUR DOCTOR. ° ° °WHEN TO CALL US [phone]: °1. Poor pain control °2. Reactions / problems with new medications (rash/itching, nausea, etc)  °3. Fever over 101.5 F (38.5 C) °4. Worsening swelling or bruising °5. Continued bleeding from incision. °6. Increased pain, redness, or drainage from the incision °7. Difficulty breathing / swallowing ° ° The clinic staff is available to answer your questions during regular business hours (8:30am-5pm).  Please don’t hesitate to call and ask to speak to one of our nurses for clinical concerns.  ° If you have a medical emergency,  go to the nearest emergency room or call 911. ° A surgeon from [facility] is always on call at the hospitals ° ° °[facility] °[address], [address], [place], [place]  [address] ? °MAIN: [phone] ? TOLL FREE: 1-800-359-8415 ?  °FAX [phone] °[email or web address] ° ° °Post Anesthesia Home Care Instructions ° °Activity: °Get plenty of rest for the remainder of the day. A responsible individual must stay with you for 24 hours following the procedure.  °For the next 24 hours, DO NOT: °-Drive a car °-Operate machinery °-Drink alcoholic beverages °-Take any medication unless instructed by your physician °-Make any legal decisions or sign important papers. ° °Meals: °Start with liquid foods such as gelatin or soup. Progress to regular foods as tolerated. Avoid greasy, spicy, heavy foods. If nausea and/or vomiting occur, drink only clear liquids until the nausea and/or vomiting subsides. Call your physician if vomiting continues. ° °Special Instructions/Symptoms: °Your throat may feel dry or sore from the anesthesia or the breathing tube placed in your throat during surgery. If this causes discomfort, gargle with warm salt water. The discomfort should disappear within 24 hours. ° °  If you had a scopolamine patch placed behind your ear for the management of post- operative nausea and/or vomiting: ° °1. The medication in the patch is effective for 72 hours, after which it should be removed.  Wrap patch in a tissue and discard in the trash. Wash hands thoroughly with soap and water. °2. You may remove the patch earlier than 72 hours if you experience unpleasant side effects which may include dry mouth, dizziness or visual disturbances. °3. Avoid touching the patch. Wash your hands with soap and water after contact with the patch. °  ° ° °

## 2017-06-07 NOTE — Anesthesia Procedure Notes (Signed)
Procedure Name: Intubation Performed by: Verita Lamb, CRNA Pre-anesthesia Checklist: Patient identified, Emergency Drugs available, Suction available, Patient being monitored and Timeout performed Patient Re-evaluated:Patient Re-evaluated prior to induction Preoxygenation: Pre-oxygenation with 100% oxygen Induction Type: IV induction Ventilation: Mask ventilation without difficulty and Oral airway inserted - appropriate to patient size Laryngoscope Size: Mac and 3 Grade View: Grade I Tube type: Oral Tube size: 7.0 mm Number of attempts: 1 Airway Equipment and Method: Stylet Placement Confirmation: ETT inserted through vocal cords under direct vision,  positive ETCO2,  CO2 detector and breath sounds checked- equal and bilateral Secured at: 22 cm Tube secured with: Tape Dental Injury: Teeth and Oropharynx as per pre-operative assessment

## 2017-06-07 NOTE — Anesthesia Preprocedure Evaluation (Addendum)
Anesthesia Evaluation  Patient identified by MRN, date of birth, ID band Patient awake    Reviewed: Allergy & Precautions, NPO status , Patient's Chart, lab work & pertinent test results  History of Anesthesia Complications Negative for: history of anesthetic complications  Airway Mallampati: I  TM Distance: >3 FB Neck ROM: Full    Dental no notable dental hx. (+) Teeth Intact   Pulmonary former smoker,    breath sounds clear to auscultation       Cardiovascular hypertension,  Rhythm:Regular Rate:Normal     Neuro/Psych  Neuromuscular disease    GI/Hepatic negative GI ROS, Neg liver ROS,   Endo/Other  Morbid obesity  Renal/GU negative Renal ROS     Musculoskeletal  (+) Arthritis ,   Abdominal (+) + obese,   Peds  Hematology   Anesthesia Other Findings   Reproductive/Obstetrics                            Anesthesia Physical Anesthesia Plan  ASA: II  Anesthesia Plan: General   Post-op Pain Management:    Induction: Intravenous  PONV Risk Score and Plan: 4 or greater and Treatment may vary due to age or medical condition, Ondansetron, Dexamethasone and Midazolam  Airway Management Planned: LMA  Additional Equipment:   Intra-op Plan:   Post-operative Plan: Extubation in OR  Informed Consent: I have reviewed the patients History and Physical, chart, labs and discussed the procedure including the risks, benefits and alternatives for the proposed anesthesia with the patient or authorized representative who has indicated his/her understanding and acceptance.   Dental advisory given  Plan Discussed with: CRNA  Anesthesia Plan Comments:         Anesthesia Quick Evaluation

## 2017-06-07 NOTE — Op Note (Signed)
Preoperative diagnosis: Left breast hidradenitis involving inferior mammary fold measuring 4 cm x 3 cm  Postoperative diagnosis: Same  Procedure: Excision of left breast hidradenitis measuring 4 cm x 3 cm with simple closure  Surgeon: Erroll Luna MD  Anesthesia: LMA with local  EBL: 20 cc  Specimen: Skin and subcutaneous tissues as above  Drains: None  IV fluids: Per anesthesia  Indications for for procedure: The patient presents with a small area of recurrent hidradenitis involving her left breast along the inferior mammary fold laterally.  She has minimal symptoms but this is been infected multiple times she wished to have it excised.  Risks, benefits and other options discussed.The procedure has been discussed with the patient.  Alternative therapies have been discussed with the patient.  Operative risks include bleeding,  Infection,  Organ injury,  Nerve injury,  Blood vessel injury,  DVT,  Pulmonary embolism,  Death,  And possible reoperation.  Medical management risks include worsening of present situation.  The success of the procedure is 50 -90 % at treating patients symptoms.  The patient understands and agrees to proceed.  Description of procedure: The patient was met in the holding area.  The area was marked with the assistance of the patient.  She had no other areas of significant hidradenitis along the inframammary fold nor in the left axilla or breast.  I discussed excision with her.  I told her it may be more extensive or could potentially be less extensive.  She wished to proceed.  She was taken back to the operating room placed supine.  After induction of general anesthesia the left breast and left axilla were prepped and draped in a sterile fashion.  Timeout was done.  The area was marked preoperatively.  Local anesthetic was infiltrated around the area and a 4 cm x 3 cm skin was excised.  Hemostasis achieved wound closed with 3-0 Vicryl and 4-0 Monocryl.  Dermabond applied  all final counts found to be correct.  Upon inspection of her left axilla I saw no appreciable disease or other signs of hidradenitis involving her left breast or left axilla.  She was then extubated and taken to recovery in satisfactory condition.

## 2017-06-08 ENCOUNTER — Encounter (HOSPITAL_BASED_OUTPATIENT_CLINIC_OR_DEPARTMENT_OTHER): Payer: Self-pay | Admitting: Surgery

## 2017-06-10 ENCOUNTER — Encounter (HOSPITAL_BASED_OUTPATIENT_CLINIC_OR_DEPARTMENT_OTHER): Payer: Self-pay | Admitting: Emergency Medicine

## 2017-06-10 ENCOUNTER — Emergency Department (HOSPITAL_BASED_OUTPATIENT_CLINIC_OR_DEPARTMENT_OTHER)
Admission: EM | Admit: 2017-06-10 | Discharge: 2017-06-10 | Disposition: A | Discharge status: Home / Self Care | Payer: Medicaid Other | Attending: Emergency Medicine | Admitting: Emergency Medicine

## 2017-06-10 ENCOUNTER — Other Ambulatory Visit: Payer: Self-pay

## 2017-06-10 DIAGNOSIS — G8918 Other acute postprocedural pain: Secondary | ICD-10-CM | POA: Diagnosis not present

## 2017-06-10 DIAGNOSIS — I10 Essential (primary) hypertension: Secondary | ICD-10-CM | POA: Diagnosis not present

## 2017-06-10 DIAGNOSIS — T8130XA Disruption of wound, unspecified, initial encounter: Secondary | ICD-10-CM | POA: Diagnosis not present

## 2017-06-10 DIAGNOSIS — Z87891 Personal history of nicotine dependence: Secondary | ICD-10-CM | POA: Insufficient documentation

## 2017-06-10 DIAGNOSIS — Y69 Unspecified misadventure during surgical and medical care: Secondary | ICD-10-CM | POA: Insufficient documentation

## 2017-06-10 DIAGNOSIS — Z79899 Other long term (current) drug therapy: Secondary | ICD-10-CM | POA: Diagnosis not present

## 2017-06-10 DIAGNOSIS — Z4801 Encounter for change or removal of surgical wound dressing: Secondary | ICD-10-CM | POA: Diagnosis present

## 2017-06-10 MED ORDER — SULFAMETHOXAZOLE-TRIMETHOPRIM 800-160 MG PO TABS
1.0000 | ORAL_TABLET | Freq: Once | ORAL | Status: AC
  Administered 2017-06-10: 1 via ORAL
  Filled 2017-06-10: qty 1

## 2017-06-10 MED ORDER — KETOROLAC TROMETHAMINE 60 MG/2ML IM SOLN
30.0000 mg | Freq: Once | INTRAMUSCULAR | Status: AC
  Administered 2017-06-10: 30 mg via INTRAMUSCULAR
  Filled 2017-06-10: qty 2

## 2017-06-10 MED ORDER — IBUPROFEN 400 MG PO TABS: 400.0000 mg | ORAL_TABLET | Freq: Four times a day (QID) | ORAL | 0 refills | Status: DC | PRN

## 2017-06-10 MED ORDER — SULFAMETHOXAZOLE-TRIMETHOPRIM 800-160 MG PO TABS: 1.0000 | ORAL_TABLET | Freq: Two times a day (BID) | ORAL | 0 refills | Status: AC

## 2017-06-10 NOTE — ED Notes (Signed)
Dr Randal Buba and this RN cleaned and changed dressing to left breast. Pt lifted breast tissue. Pt demonstrated how to clean and change dressing . PT voiced understanding of how to clean wound twice a day and change dressing twice a day.

## 2017-06-10 NOTE — ED Provider Notes (Signed)
[facility] Provider Note   CSN: [id number] Arrival date & time: 06/10/17  0203     History   Chief Complaint Chief Complaint  Patient presents with  . Post-op Problem    HPI Stacy Campos is a 46 y.o. female.  The history is provided by the patient.  Wound Check  This is a new problem. The current episode started more than 2 days ago (post op from cyst removal at [facility] on 11/14.  Has not been doing dressing changes and is having increased pain.  has not looked at the wound since the procedure.  ). The problem occurs constantly. The problem has been gradually worsening. Pertinent negatives include no chest pain. Nothing aggravates the symptoms. Nothing relieves the symptoms. Treatments tried: vicodin. The treatment provided no relief.  No f/c/r.  Initially denied drainage but then when asked about crusting on abdominal wall stated there was a one time leakage of fluid earlier in the evening but she did not look at it.    Past Medical History:  Diagnosis Date  . Arthritis    "knees, back" (02/03/2017)  . Chronic lower back pain   . Daily headache    "related to my BP" (02/03/2017)  . Elevated blood pressure reading without diagnosis of hypertension   . Hidradenitis    "chronic; been ongoing for > 1 yr" (02/03/2017)  . Hypertension   . Scoliosis     Patient Active Problem List   Diagnosis Date Noted  . Mastitis, left, acute 02/17/2017  . Breast pain, left   . Left axillary pain 02/03/2017  . Atypical chest pain 09/23/2016  . Left knee pain 06/23/2016  . Blurry vision 06/08/2016  . Carpal tunnel syndrome 05/17/2016  . Hydradenitis 12/07/2015  . HTN (hypertension) 01/07/2010    Past Surgical History:  Procedure Laterality Date  . DILATION AND CURETTAGE OF UTERUS    . EXCISION HIDRADENITIS OF LEFT BREAST Left 06/07/2017   Performed by Erroll Luna, MD at [facility] ARTHROSCOPY Bilateral 1992    OB  History    Gravida Para Term Preterm AB Living   5 3 1 2 2 3    SAB TAB Ectopic Multiple Live Births   2               Home Medications    Prior to Admission medications   Medication Sig Start Date End Date Taking? Authorizing Provider  HYDROcodone-acetaminophen (NORCO/VICODIN) 5-325 MG tablet Take 1-2 tablets every 6 (six) hours as needed by mouth for moderate pain. 06/07/17  Yes Cornett, [name], MD  acetaminophen (TYLENOL) 500 MG tablet Take 1,500 mg by mouth daily as needed (pain).     [provider]  chlorthalidone (HYGROTON) 25 MG tablet Take 1 tablet (25 mg total) by mouth daily as needed. For leg edema or fluid retention 03/09/17   Lorretta Harp, MD  ibuprofen (ADVIL,MOTRIN) 400 MG tablet Take 1 tablet (400 mg total) every 6 (six) hours as needed by mouth. 06/10/17   Jessee Mezera, MD  losartan (COZAAR) 100 MG tablet Take 1 tablet (100 mg total) by mouth daily. 02/08/17 05/31/17  Lorretta Harp, MD  metoprolol succinate (TOPROL-XL) 50 MG 24 hr tablet Take 1 tablet (50 mg total) by mouth every evening. Take with or immediately following a meal. 04/12/17   Lorretta Harp, MD  Multiple Vitamin (MULTIVITAMIN WITH MINERALS) TABS tablet Take 1 tablet by mouth daily.    [provider]  naproxen sodium (ALEVE) 220 MG tablet Take 220 mg by mouth every 12 (twelve) hours.    [provider]  sulfamethoxazole-trimethoprim (BACTRIM DS,SEPTRA DS) 800-160 MG tablet Take 2 tablets by mouth 2 (two) times daily. 05/03/17   Smiley Houseman, MD  sulfamethoxazole-trimethoprim (BACTRIM DS,SEPTRA DS) 800-160 MG tablet Take 1 tablet 2 (two) times daily for 7 days by mouth. 06/10/17 06/17/17  Nerea Bordenave, MD    Family History Family History  Problem Relation Age of Onset  . Arthritis Mother   . Rheum arthritis Mother   . Lung disease Mother        interstitial 2/2 RA, rheumatic heart disease  . Stroke Father   . Hypertension Father   . Diabetes Father   .  Heart disease Father   . Hyperlipidemia Father   . Stroke Maternal Grandmother   . Diabetes Maternal Grandmother   . Heart disease Maternal Grandfather   . Diabetes Paternal Grandmother   . Diabetes Paternal Grandfather   . Heart disease Paternal Grandfather   . Cancer Sister        UTERINE- SPREAD TO LUNGS/SPLEEN/LIVER  . Breast cancer Paternal Aunt     Social History Social History   Tobacco Use  . Smoking status: Former Smoker    Years: 2.00    Types: Cigarettes    Last attempt to quit: 1990    Years since quitting: 28.8  . Smokeless tobacco: Never Used  Substance Use Topics  . Alcohol use: Yes    Alcohol/week: 0.0 oz    Comment: 02/03/2017 "I'll drink on big events; maybe 3 times/year"  . Drug use: No     Allergies   Dimetapp [albertsons di bromm]   Review of Systems Review of Systems  Constitutional: Negative for appetite change, chills, fatigue and fever.  Cardiovascular: Negative for chest pain.  Musculoskeletal: Positive for myalgias.  Skin: Positive for wound. Negative for color change, pallor and rash.  Neurological: Negative for weakness.  All other systems reviewed and are negative.    Physical Exam Updated Vital Signs BP (!) 150/90 (BP Location: Right Arm)   Pulse 80   Temp 98.3 F (36.8 C) (Oral)   Resp 18   SpO2 98%   Physical Exam  Constitutional: She is oriented to person, place, and time. She appears well-developed and well-nourished. No distress.  Sleeping soundly upon entrance to room easily arouses.    HENT:  Head: Normocephalic and atraumatic.  Eyes: Conjunctivae are normal. Pupils are equal, round, and reactive to light.  Neck: Normal range of motion. Neck supple.  Cardiovascular: Normal rate, regular rhythm, normal heart sounds and intact distal pulses.    Pulmonary/Chest: Effort normal and breath sounds normal. No stridor. She has no wheezes. She has no rales.  Abdominal: Soft. Bowel sounds are normal. There is no tenderness.  There is no rebound and no guarding.  Musculoskeletal: Normal range of motion.  Neurological: She is alert and oriented to person, place, and time.  Skin: Skin is warm and dry. Capillary refill takes less than 2 seconds. No erythema.  Psychiatric: Her affect is angry.     ED Treatments / Results   Vitals:   06/10/17 0209 06/10/17 0525  BP: (!) 150/90 (!) 148/89  Pulse: 80 84  Resp: 18 18  Temp: 98.3 F (36.8 C)   SpO2: 98% 99%    Radiology No results found.  Procedures Procedures (including critical care time)  Medications Ordered in ED Medications  ketorolac (TORADOL) injection 30  mg (30 mg Intramuscular Given 06/10/17 0434)  sulfamethoxazole-trimethoprim (BACTRIM DS,SEPTRA DS) 800-160 MG per tablet 1 tablet (1 tablet Oral Given 06/10/17 0437)     430 case d/w Dr. [name] of [facility]. Anti inflammatories are fine.  Add bactrim.  Instruct patient on BID dressing changes call Monday to be seen in same day clinic  Wound care performed pilled up adhesive was swept away using chlorhexidine swab.  Cleansed thoroughly and sterile dressing applied.  Given instructions to do twice daily dressing changes with Crystal, nurse, present.  Supplies also provided.    I suspect the there was a seroma and this dehisced.  It is consistent with the crusting on the abdominal wall.     Final Clinical Impressions(s) / ED Diagnoses   Final diagnoses:  Wound dehiscence  Postoperative pain  Instructed to begin bactrim, do twice daily dressing changes as shown in the ED, add ibuprofen to your vicodin and call to be seen Monday.    All questions answered to the patient's satisfaction.    Strict return precautions for fever, global weakness, blood in the urine, abdominal distention, vomiting, no drainage from the foley catheter, swelling or the lips or tongue, chest pain, dyspnea on exertion, new weakness or numbness changes in vision or speech, fevers, weakness persistent pain, Inability to tolerate  liquids or food, changes in voice cough, altered mental status or any concerns. No signs of systemic illness or infection. The patient is nontoxic-appearing on exam and vital signs are within normal limits.    I have reviewed the triage vital signs and the nursing notes. Pertinent labs &imaging results that were available during my care of the patient were reviewed by me and considered in my medical decision making (see chart for details).  After history, exam, and medical workup I feel the patient has been appropriately medically screened and is safe for discharge home. Pertinent diagnoses were discussed with the patient. Patient was given return precautions   ED Discharge Orders        Ordered    ibuprofen (ADVIL,MOTRIN) 400 MG tablet  Every 6 hours PRN     06/10/17 0511    sulfamethoxazole-trimethoprim (BACTRIM DS,SEPTRA DS) 800-160 MG tablet  2 times daily     06/10/17 0511       Jerney Baksh, MD 06/10/17 [id number]

## 2017-06-10 NOTE — ED Triage Notes (Addendum)
PT presents with c/o pain to post op site under left breast were she has a cyst removed Wednesday. PT states she has not looked at site and does not know if it is red or inflamed. Last dose of Vicodin at 9pm last night.

## 2017-06-10 NOTE — ED Notes (Signed)
Pt discharged to home. Pt voiced understanding of discharge instructions. NAD.

## 2017-06-19 ENCOUNTER — Other Ambulatory Visit: Payer: Self-pay | Admitting: Cardiovascular Disease

## 2017-06-20 NOTE — Telephone Encounter (Signed)
REFILL 

## 2017-07-04 ENCOUNTER — Ambulatory Visit: Payer: Medicaid Other

## 2017-10-26 ENCOUNTER — Other Ambulatory Visit: Payer: Self-pay

## 2017-10-26 ENCOUNTER — Ambulatory Visit: Payer: Medicaid Other | Admitting: Student

## 2017-10-26 ENCOUNTER — Encounter: Payer: Self-pay | Admitting: Student

## 2017-10-26 VITALS — BP 144/90 | HR 111 | Temp 97.9°F | Wt 292.6 lb

## 2017-10-26 DIAGNOSIS — J029 Acute pharyngitis, unspecified: Secondary | ICD-10-CM | POA: Insufficient documentation

## 2017-10-26 DIAGNOSIS — R000 Tachycardia, unspecified: Secondary | ICD-10-CM | POA: Insufficient documentation

## 2017-10-26 LAB — POCT RAPID STREP A (OFFICE): Rapid Strep A Screen: NEGATIVE

## 2017-10-26 MED ORDER — DEXAMETHASONE SODIUM PHOSPHATE 10 MG/ML IJ SOLN
10.0000 mg | Freq: Once | INTRAMUSCULAR | Status: AC
  Administered 2017-10-26: 10 mg via INTRAMUSCULAR

## 2017-10-26 NOTE — Progress Notes (Signed)
Subjective:    Stacy Campos is a 47 y.o. old female here sore throat  HPI Sore throat: for three days. She also reports runny nose or congestion. Denies fever but admits headache. Denies chest pain but difficulty breathing with lying down. She also have cough with greenish phlegm. Denies sick contact. Just got back from [place]. She was in [place] for three days with her daughter who just started [employer]. Had her flu vaccine this year.  PMH/Problem List: has HTN (hypertension); Hydradenitis; Carpal tunnel syndrome; Blurry vision; Left knee pain; Atypical chest pain; Left axillary pain; Breast pain, left; Mastitis, left, acute; Sore throat; and Tachycardia on their problem list.   has a past medical history of Arthritis, Chronic lower back pain, Daily headache, Elevated blood pressure reading without diagnosis of hypertension, Hidradenitis, Hypertension, and Scoliosis.  FH:  Family History  Problem Relation Age of Onset  . Arthritis Mother   . Rheum arthritis Mother   . Lung disease Mother        interstitial 2/2 RA, rheumatic heart disease  . Stroke Father   . Hypertension Father   . Diabetes Father   . Heart disease Father   . Hyperlipidemia Father   . Stroke Maternal Grandmother   . Diabetes Maternal Grandmother   . Heart disease Maternal Grandfather   . Diabetes Paternal Grandmother   . Diabetes Paternal Grandfather   . Heart disease Paternal Grandfather   . Cancer Sister        UTERINE- SPREAD TO LUNGS/SPLEEN/LIVER  . Breast cancer Paternal Aunt     SH Social History   Tobacco Use  . Smoking status: Former Smoker    Years: 2.00    Types: Cigarettes    Last attempt to quit: 1990    Years since quitting: 29.2  . Smokeless tobacco: Never Used  Substance Use Topics  . Alcohol use: Yes    Alcohol/week: 0.0 oz    Comment: 02/03/2017 "I'll drink on big events; maybe 3 times/year"  . Drug use: No    Review of Systems Review of systems negative except for pertinent positives  and negatives in history of present illness above.     Objective:     Vitals:   10/26/17 1342  BP: (!) 144/90  Pulse: (!) 111  Temp: 97.9 F (36.6 C)  TempSrc: Axillary  SpO2: 98%  Weight: 132.7 kg (292 lb 9.6 oz)   Body mass index is 45.83 kg/m.  Physical Exam  GEN: appears well & comfortable. No apparent distress. Head: normocephalic and atraumatic  Eyes: conjunctiva without injection. Sclera anicteric. Ears: external ear, ear canal and TM normal Nares: no rhinorrhea. No swollen turbinates. Mild erythema of nasal mucosa Oropharynx: MMM. No erythema. No exudation or petechiae some tonsillar hypertrophy.  Uvula midline HEM: Significant tenderness to palpation over anterior cervical triangles and thyroid gland.  No overlying skin inflammation or skin color change. CVS: Tachycardic, regular, nl s1 & s2, no murmurs, no edema RESP: no IWOB, good air movement bilaterally, CTAB GI: BS present & normal, soft, NTND SKIN: Acanthosis nigricans ENDO: Some thyromegaly with tenderness NEURO: alert and oiented appropriately, no gross deficits   PSYCH: euthymic mood with congruent affect    Assessment and Plan:  1. Sore throat: given associated runny nose and congestion, this is likely due to viral URI.  Rapid strep negative.  She has some tonsillar hypertrophy without exudation or erythema.  Full range of motion in neck.  No uvular deviation.  Cardiopulmonary exam was normal except tachycardia.  Give Decadron 10 mg.  Recommended conservative management including a tablespoonful of honey and adequate hydration.  Discussed return precautions. - dexamethasone (DECADRON) injection 10 mg  2. Tachycardia: Likely due to viral URI.  However, she has some tenderness to palpation over her thyroid gland.  Although she denies symptoms of hypo-or hyperthyroidism, she could have acute thyroiditis.  We will check TSH and free T4 to exclude this.  Return if symptoms worsen or fail to improve.  Mercy Riding, MD 10/26/17 Pager: [phone]

## 2017-10-26 NOTE — Patient Instructions (Signed)
It appears that you have a viral upper respiratory infection (Common Cold).  Cold symptoms typically peak at 3-4 days of illness and then gradually improve over 10-14 days. However, a cough may last 3-5 weeks.   - A tablespoonful of honey before bedtime is helpful for cough - Get plenty of rest and adequate hydration. - Consume warm fluids (soup or tea). It relieves stuffy nose, and to loosen phlegm. - Can try saline nasal spray or a Neti Pot for stuffy nose  CONTACT YOUR DOCTOR IF YOU EXPERIENCE ANY OF THE FOLLOWING: - High fever, chest pain, shortness of breath or  not able to keep down food or fluids.  - Cough that gets worse while other cold symptoms improve - Flare up of any chronic lung problem, such as asthma - Your symptoms persist longer than 2 weeks

## 2017-10-27 LAB — T4, FREE: FREE T4: 1.02 ng/dL (ref 0.82–1.77)

## 2017-10-27 LAB — TSH: TSH: 1.67 u[IU]/mL (ref 0.450–4.500)

## 2017-10-29 ENCOUNTER — Telehealth: Payer: Self-pay | Admitting: Internal Medicine

## 2017-10-29 MED ORDER — FLUTICASONE PROPIONATE 50 MCG/ACT NA SUSP: 2.0000 | Freq: Every day | NASAL | 6 refills | Status: DC

## 2017-10-29 NOTE — Telephone Encounter (Signed)
After Hours Emergency Line:  Received a call from Ms. Skowron stating that she was seen in our clinic on Wednesday with a sore throat. She was given Decadron at that time. She was told that the provider would send in a prescription for Flonase to help with her congestion; however, the prescription was never sent to the pharmacy. Rx for Flonase sent. Patient advised to follow-up in clinic this week if her symptoms are not improving.  Will forward to PCP for FYI.  Hyman Bible, MD PGY-3

## 2017-11-14 ENCOUNTER — Other Ambulatory Visit: Payer: Self-pay

## 2017-11-14 ENCOUNTER — Encounter (HOSPITAL_BASED_OUTPATIENT_CLINIC_OR_DEPARTMENT_OTHER): Payer: Self-pay | Admitting: *Deleted

## 2017-11-14 ENCOUNTER — Emergency Department (HOSPITAL_BASED_OUTPATIENT_CLINIC_OR_DEPARTMENT_OTHER)
Admission: EM | Admit: 2017-11-14 | Discharge: 2017-11-15 | Disposition: A | Discharge status: Home / Self Care | Payer: Medicaid Other | Attending: Emergency Medicine | Admitting: Emergency Medicine

## 2017-11-14 DIAGNOSIS — I10 Essential (primary) hypertension: Secondary | ICD-10-CM | POA: Insufficient documentation

## 2017-11-14 DIAGNOSIS — Z87891 Personal history of nicotine dependence: Secondary | ICD-10-CM | POA: Diagnosis not present

## 2017-11-14 DIAGNOSIS — J039 Acute tonsillitis, unspecified: Secondary | ICD-10-CM | POA: Insufficient documentation

## 2017-11-14 DIAGNOSIS — Z79899 Other long term (current) drug therapy: Secondary | ICD-10-CM | POA: Insufficient documentation

## 2017-11-14 DIAGNOSIS — J029 Acute pharyngitis, unspecified: Secondary | ICD-10-CM | POA: Diagnosis present

## 2017-11-14 LAB — RAPID STREP SCREEN (MED CTR MEBANE ONLY): Streptococcus, Group A Screen (Direct): NEGATIVE

## 2017-11-14 MED ORDER — ACETAMINOPHEN 160 MG/5ML PO SOLN
650.0000 mg | Freq: Once | ORAL | Status: AC
  Administered 2017-11-14: 650 mg via ORAL
  Filled 2017-11-14: qty 20.3

## 2017-11-14 NOTE — ED Triage Notes (Signed)
Sore throat and pain in her right ear. Her tonsils are very large and beefy red.

## 2017-11-15 MED ORDER — PENICILLIN G BENZATHINE 1200000 UNIT/2ML IM SUSP
1.2000 10*6.[IU] | Freq: Once | INTRAMUSCULAR | Status: AC
  Administered 2017-11-15: 1.2 10*6.[IU] via INTRAMUSCULAR
  Filled 2017-11-15: qty 2

## 2017-11-15 MED ORDER — DEXAMETHASONE 10 MG/ML FOR PEDIATRIC ORAL USE
10.0000 mg | Freq: Once | INTRAMUSCULAR | Status: AC
  Administered 2017-11-15: 10 mg via ORAL
  Filled 2017-11-15: qty 1

## 2017-11-15 MED ORDER — HYDROCODONE-ACETAMINOPHEN 7.5-325 MG/15ML PO SOLN
10.0000 mL | Freq: Once | ORAL | Status: AC
  Administered 2017-11-15: 10 mL via ORAL
  Filled 2017-11-15: qty 15

## 2017-11-15 MED ORDER — HYDROCODONE-ACETAMINOPHEN 7.5-325 MG/15ML PO SOLN: ORAL | 0 refills | Status: DC

## 2017-11-15 NOTE — ED Provider Notes (Signed)
[facility] Provider Note: [name], MD, FACEP  CSN: [id number] MRN: [id number] ARRIVAL: 11/14/17 at 2053 ROOM: [facility]  Sore Throat   HISTORY OF PRESENT ILLNESS  11/15/17 12:42 AM Stacy Campos is a 47 y.o. female with a 2-day history of pain in her throat.  She rates the pain as a 9 out of 10, worse with swallowing.  The pain radiates to her right ear.  She was noted to have a low-grade fever on arrival.  There is associated anterior cervical lymphadenopathy.  She denies nausea, vomiting or diarrhea.  She describes the pain as feeling like something is lodged in her throat.  She denies a history of tonsillitis in the past.  Consultation with the [place] state controlled substances database reveals the patient has received 1 prescription for hydrocodone in the past year.   Past Medical History:  Diagnosis Date  . Arthritis    "knees, back" (02/03/2017)  . Chronic lower back pain   . Daily headache    "related to my BP" (02/03/2017)  . Elevated blood pressure reading without diagnosis of hypertension   . Hidradenitis    "chronic; been ongoing for > 1 yr" (02/03/2017)  . Hypertension   . Scoliosis     Past Surgical History:  Procedure Laterality Date  . DILATION AND CURETTAGE OF UTERUS    . HYDRADENITIS EXCISION Left 06/07/2017   Procedure: EXCISION HIDRADENITIS OF LEFT BREAST;  Surgeon: Erroll Luna, MD;  Location: [facility];  Service: General;  Laterality: Left;  . KNEE ARTHROSCOPY Bilateral 1992    Family History  Problem Relation Age of Onset  . Arthritis Mother   . Rheum arthritis Mother   . Lung disease Mother        interstitial 2/2 RA, rheumatic heart disease  . Stroke Father   . Hypertension Father   . Diabetes Father   . Heart disease Father   . Hyperlipidemia Father   . Stroke Maternal Grandmother   . Diabetes Maternal Grandmother   . Heart disease Maternal Grandfather   . Diabetes Paternal  Grandmother   . Diabetes Paternal Grandfather   . Heart disease Paternal Grandfather   . Cancer Sister        UTERINE- SPREAD TO LUNGS/SPLEEN/LIVER  . Breast cancer Paternal Aunt     Social History   Tobacco Use  . Smoking status: Former Smoker    Years: 2.00    Types: Cigarettes    Last attempt to quit: 1990    Years since quitting: 29.3  . Smokeless tobacco: Never Used  Substance Use Topics  . Alcohol use: Yes    Alcohol/week: 0.0 oz    Comment: 02/03/2017 "I'll drink on big events; maybe 3 times/year"  . Drug use: No    Prior to Admission medications   Medication Sig Start Date End Date Taking? Authorizing Provider  acetaminophen (TYLENOL) 500 MG tablet Take 1,500 mg by mouth daily as needed (pain).     [provider]  chlorthalidone (HYGROTON) 25 MG tablet Take 1 tablet (25 mg total) by mouth daily as needed. For leg edema or fluid retention 03/09/17   Lorretta Harp, MD  fluticasone [facility]) 50 MCG/ACT nasal spray Place 2 sprays into both nostrils daily. 10/29/17   Mayo, Pete Pelt, MD  HYDROcodone-acetaminophen (NORCO/VICODIN) 5-325 MG tablet Take 1-2 tablets every 6 (six) hours as needed by mouth for moderate pain. 06/07/17   Erroll Luna, MD  ibuprofen (ADVIL,MOTRIN)  400 MG tablet Take 1 tablet (400 mg total) every 6 (six) hours as needed by mouth. 06/10/17   Palumbo, April, MD  losartan (COZAAR) 100 MG tablet Take 1 tablet (100 mg total) by mouth daily. 02/08/17 05/31/17  Lorretta Harp, MD  metoprolol succinate (TOPROL-XL) 25 MG 24 hr tablet TAKE 1 TABLET BY MOUTH EVERY EVENING WITH, OR IMMEDIATELY FOLLOWING A MEAL 06/20/17   Lorretta Harp, MD  Multiple Vitamin (MULTIVITAMIN WITH MINERALS) TABS tablet Take 1 tablet by mouth daily.    [provider]  naproxen sodium (ALEVE) 220 MG tablet Take 220 mg by mouth every 12 (twelve) hours.    [provider]  sulfamethoxazole-trimethoprim (BACTRIM DS,SEPTRA DS) 800-160 MG tablet Take 2 tablets  by mouth 2 (two) times daily. 05/03/17   Smiley Houseman, MD    Allergies Dimetapp [albertsons di bromm]   REVIEW OF SYSTEMS  Negative except as noted here or in the History of Present Illness.   PHYSICAL EXAMINATION  Initial Vital Signs Blood pressure (!) 152/110, pulse 97, temperature 99.4 F (37.4 C), temperature source Oral, resp. rate 18, weight 132.5 kg (292 lb), SpO2 97 %.  Examination General: Well-developed, well-nourished female in no acute distress; appearance consistent with age of record HENT: normocephalic; atraumatic; bilateral tonsillar enlargement with exudate; uvula midline; "hot potato voice"; no stridor; TMs normal Eyes: pupils equal, round and reactive to light; extraocular muscles intact Neck: supple; anterior cervical lymphadenopathy Heart: regular rate and rhythm Lungs: clear to auscultation bilaterally Abdomen: soft; nondistended; nontender; bowel sounds present Extremities: No deformity; full range of motion; pulses normal Neurologic: Awake, alert and oriented; motor function intact in all extremities and symmetric; no facial droop Skin: Warm and dry Psychiatric: Flat affect   RESULTS  Summary of this visit's results, reviewed by myself:   EKG Interpretation  Date/Time:    Ventricular Rate:    PR Interval:    QRS Duration:   QT Interval:    QTC Calculation:   R Axis:     Text Interpretation:        Laboratory Studies: Results for orders placed or performed during the hospital encounter of 11/14/17 (from the past 24 hour(s))  Rapid Strep Screen ([facility] & [facility] ONLY)     Status: None   Collection Time: 11/14/17  9:11 PM  Result Value Ref Range   Streptococcus, Group A Screen (Direct) NEGATIVE NEGATIVE   Imaging Studies: No results found.  ED COURSE and MDM  Nursing notes and initial vitals signs, including pulse oximetry, reviewed.  Vitals:   11/14/17 2104 11/14/17 2106 11/15/17 0016  BP:  (!) 165/99 (!) 152/110  Pulse:  (!) 115 97   Resp:  20 18  Temp:  99.4 F (37.4 C)   TempSrc:  Oral   SpO2:  99% 97%  Weight: 132.5 kg (292 lb)     Examination and history consistent with acute tonsillitis.  Patient is requesting a shot of Bicillin as opposed to oral medications.  We will also give a dose of steroids to help reduce inflammation.  PROCEDURES    ED DIAGNOSES     ICD-10-CM   1. Tonsillitis J03.90        Ikhlas Albo, Jenny Reichmann, MD 11/15/17 0104

## 2017-11-17 LAB — CULTURE, GROUP A STREP (THRC)

## 2017-12-01 ENCOUNTER — Other Ambulatory Visit: Payer: Self-pay | Admitting: Cardiovascular Disease

## 2017-12-01 NOTE — Telephone Encounter (Signed)
REFILL 

## 2018-02-20 ENCOUNTER — Other Ambulatory Visit: Payer: Self-pay | Admitting: *Deleted

## 2018-02-20 MED ORDER — METOPROLOL SUCCINATE ER 25 MG PO TB24: ORAL_TABLET | ORAL | 0 refills | Status: DC

## 2018-02-20 MED ORDER — LOSARTAN POTASSIUM 100 MG PO TABS: 100.0000 mg | ORAL_TABLET | Freq: Every day | ORAL | 0 refills | Status: DC

## 2018-02-20 NOTE — Telephone Encounter (Signed)
Pt has appt on 03/14/18 with Dr. Tammi Klippel.  She has been out of meds for 9 days.  Her BP today was 190/101 and she is having blurry vision.  Advised I would send a request to PCP to refill for one month, but recommends that she go to the UC or ED since she is having blurred vision. Lemon Sternberg, Salome Spotted, CMA

## 2018-03-14 ENCOUNTER — Ambulatory Visit: Payer: Medicaid Other | Admitting: Family Medicine

## 2018-03-18 ENCOUNTER — Other Ambulatory Visit: Payer: Self-pay | Admitting: Family Medicine

## 2018-03-19 ENCOUNTER — Ambulatory Visit (INDEPENDENT_AMBULATORY_CARE_PROVIDER_SITE_OTHER): Payer: Medicaid Other | Admitting: Family Medicine

## 2018-03-19 VITALS — BP 135/80 | HR 104 | Temp 99.0°F | Wt 287.6 lb

## 2018-03-19 DIAGNOSIS — F4321 Adjustment disorder with depressed mood: Secondary | ICD-10-CM

## 2018-03-19 DIAGNOSIS — I10 Essential (primary) hypertension: Secondary | ICD-10-CM | POA: Diagnosis not present

## 2018-03-19 HISTORY — DX: Adjustment disorder with depressed mood: F43.21

## 2018-03-19 NOTE — Assessment & Plan Note (Signed)
Patient with recent loss of her sister as well as having to place her mother in hospice shortly after.  Additional stressors including daughter leaving for college and moving to [place] as well as son moving to [place].  Patient states that she is coping well but does note that her blood pressure is raised.  States that there is a significant amount of stress on her from dealing with her sister's affairs as well as taking care of her mother.  That patient is agreeable and very interested in following up with behavioral health.  Unable to give warm handoff as behavioral health counselor was meeting with another patient at that time.  [facility] form given to patient and patient agreeable to call to schedule follow-up.

## 2018-03-19 NOTE — Patient Instructions (Signed)
It was a pleasure seeing you today.   Today we discussed your blood pressure  For your blood pressure: please stop doubling your losartan. You can take a max of 50mg  more than your prescribed 100mg  till you see cardiology. I will obtain labs today and call or send a letter with results. PLEASE CALL CARDIOLOGY TODAY and schedule a follow up.   IF YOU HAVE SHORTNESS OF BREATH OR CHEST PAIN GO TO THE EMERGENCY ROOM!  Please follow up in 1 month or sooner if symptoms persist or worsen. Please call the clinic immediately if you have any concerns.   Our clinic's number is [phone]. Please call with questions or concerns.   Thank you,  Caroline More, DO

## 2018-03-19 NOTE — Progress Notes (Signed)
Subjective:    Patient ID: Stacy Campos, female    DOB: 1970/10/08, 47 y.o.   MRN: [id number]   CC: HTN follow up   HPI: Hypertension: - Medications: losartan 100mg  daily,, metoprolol 25mg  daily, chlorthalidone 25mg  daily  - Compliance: Has been doubling up on her losartan - Checking BP at home: Patient reports blood pressures elevated to 185-190/100 before blood pressure medications.  After taking medications at double the dose they are 156/90. - Endorses SOB, CP, vision changes (white floaters), LE edema. Denies medication SEs, or symptoms of hypotension - Diet: Began plant-based diet.  Has been decreasing breads, rice, pasta. - Exercise: None, trying to lose weight before increasing exercise as exercising causes some pain   Stress Patient today reports significant amount of stress in her life.  States that her sister recently passed away from stage IV cancer in her mother shortly afterwards need to be put in hospice.  Patient also just moved her daughter to college in [place] and her son recently moved to [place].  Patient states she is coping well but does note significant amount of stress in her life.  States that she is also having to deal with her sister's affairs after passing which is causing her increased stress.  States that she thinks this is where her blood pressure is elevated.  Objective:  BP 135/80   Pulse (!) 104   Temp 99 F (37.2 C) (Oral)   Wt 287 lb 9.6 oz (130.5 kg)   SpO2 97%   BMI 45.04 kg/m  Vitals and nursing note reviewed  General: well nourished, in no acute distress HEENT: normocephalic, moist mucous membranes  Cardiac: RRR, clear S1 and S2, no murmurs, rubs, or gallops Respiratory: clear to auscultation bilaterally, no increased work of breathing Abdomen: soft, nontender, nondistended, no masses or organomegaly. Bowel sounds present Extremities: no edema or cyanosis. Warm, well perfused. 2+ radial pulses bilaterally Skin: warm and dry, no rashes  noted Neuro: alert and oriented, no focal deficits   Assessment & Plan:    HTN (hypertension) Uncontrolled.  Patient with multiple life stressors at this time which may cause some elevation in blood pressure, however this significant elevation is likely not caused by stress alone.  Patient may need further work-up for secondary causes of hypertension.  Will obtain renin to aldosterone ratio today.  If normal can consider renal ultrasound.  Instructed patient to stop doubling up on losartan as 200 mg is over recommended dose.  Instructed patient that she may use a max of 50 mg increased of losartan if blood pressures are uncontrolled.  Stressed importance of following up with cardiology as patient is both tachycardic and has elevated blood pressures.  Instructed patient to call cardiologist today to schedule a follow-up appointment.  Patient with multiple symptoms of hypertension including vision changes, chest pain, shortness of breath, edema.  Instructed patient that if she becomes symptomatic she needs to immediately go to the emergency department. -Follow-up with cardiology -Follow up with [facility] in 1 month  Grief Patient with recent loss of her sister as well as having to place her mother in hospice shortly after.  Additional stressors including daughter leaving for college and moving to [place] as well as son moving to [place].  Patient states that she is coping well but does note that her blood pressure is raised.  States that there is a significant amount of stress on her from dealing with her sister's affairs as well as taking care of her mother.  That patient is agreeable and very interested in following up with behavioral health.  Unable to give warm handoff as behavioral health counselor was meeting with another patient at that time.  [facility] form given to patient and patient agreeable to call to schedule follow-up.   Discussed patient with Dr. Doren Custard  Return in about 4 weeks  (around 04/16/2018).   Caroline More, DO, PGY-2

## 2018-03-19 NOTE — Assessment & Plan Note (Addendum)
Uncontrolled.  Patient with multiple life stressors at this time which may cause some elevation in blood pressure, however this significant elevation is likely not caused by stress alone.  Patient may need further work-up for secondary causes of hypertension.  Will obtain renin to aldosterone ratio today.  If normal can consider renal ultrasound.  Instructed patient to stop doubling up on losartan as 200 mg is over recommended dose.  Instructed patient that she may use a max of 50 mg increased of losartan if blood pressures are uncontrolled.  Stressed importance of following up with cardiology as patient is both tachycardic and has elevated blood pressures.  Instructed patient to call cardiologist today to schedule a follow-up appointment.  Patient with multiple symptoms of hypertension including vision changes, chest pain, shortness of breath, edema.  Instructed patient that if she becomes symptomatic she needs to immediately go to the emergency department. -Follow-up with cardiology -Follow up with [facility] in 1 month

## 2018-03-23 LAB — ALDOSTERONE + RENIN ACTIVITY W/ RATIO
ALDOS/RENIN RATIO: 2 (ref 0.0–30.0)
ALDOSTERONE: 1.1 ng/dL (ref 0.0–30.0)
Renin: 0.546 ng/mL/hr (ref 0.167–5.380)

## 2018-03-27 ENCOUNTER — Telehealth: Payer: Self-pay | Admitting: *Deleted

## 2018-03-27 NOTE — Telephone Encounter (Signed)
LMOVM informing pt of normal results. Shabnam Ladd, CMA  

## 2018-03-27 NOTE — Telephone Encounter (Signed)
-----   Message from Caroline More, DO sent at 03/27/2018  1:05 PM EDT ----- Please inform patient that her results are normal.

## 2018-04-12 ENCOUNTER — Telehealth: Payer: Self-pay | Admitting: Pharmacist

## 2018-04-12 NOTE — Telephone Encounter (Signed)
Thanks for the update. F/U visit already scheduled for next week.

## 2018-04-12 NOTE — Telephone Encounter (Signed)
Pt called reporting high blood pressure and HR. She states that her pressures have been running high 190s/100s with HR in 100s for last several days. She has had a headache associated with this as well. I advised she increase her metoprolol to 50mg  daily and scheduled her for follow up with HTN clinic next week at [facility] where she has been managed in the past. Pt states understanding of plan and will call with any other concerns.

## 2018-04-17 ENCOUNTER — Ambulatory Visit (INDEPENDENT_AMBULATORY_CARE_PROVIDER_SITE_OTHER): Payer: Medicaid Other | Admitting: Pharmacist Clinician (PhC)/ Clinical Pharmacy Specialist

## 2018-04-17 ENCOUNTER — Encounter: Payer: Self-pay | Admitting: Pharmacist Clinician (PhC)/ Clinical Pharmacy Specialist

## 2018-04-17 DIAGNOSIS — I10 Essential (primary) hypertension: Secondary | ICD-10-CM | POA: Diagnosis not present

## 2018-04-17 MED ORDER — DIOVAN 320 MG PO TABS: 320.0000 mg | ORAL_TABLET | Freq: Every day | ORAL | 5 refills | Status: DC

## 2018-04-17 NOTE — Assessment & Plan Note (Signed)
Patient with systolic/diastolic hypertension currently not well controlled.  She is concerned about the high home readings.  In office today not as bad, at 138/90.  Will switch her from losartan to valsartan 320 mg once daily.  She is to continue with home BP monitoring.  We also reviewed her lifestyle and some suggestions were made to help decrease the sodium (and fat) in her diet.  She will work on making these changes as well.  She will return in 3-4 weeks for follow up.

## 2018-04-17 NOTE — Patient Instructions (Signed)
Return for a a follow up appointment in 3 weeks  Your blood pressure today is 138/90  Check your blood pressure at home daily and keep record of the readings.  Take your BP meds as follows:  Switch losartan to Diovan (valsartan) 320 mg once daily in the mornings  Continue with all your other medications  Bring all of your meds, your BP cuff and your record of home blood pressures to your next appointment.  Exercise as you're able, try to walk approximately 30 minutes per day.  Keep salt intake to a minimum, especially watch canned and prepared boxed foods.  Eat more fresh fruits and vegetables and fewer canned items.  Avoid eating in fast food restaurants.    HOW TO TAKE YOUR BLOOD PRESSURE: . Rest 5 minutes before taking your blood pressure. .  Don't smoke or drink caffeinated beverages for at least 30 minutes before. . Take your blood pressure before (not after) you eat. . Sit comfortably with your back supported and both feet on the floor (don't cross your legs). . Elevate your arm to heart level on a table or a desk. . Use the proper sized cuff. It should fit smoothly and snugly around your bare upper arm. There should be enough room to slip a fingertip under the cuff. The bottom edge of the cuff should be 1 inch above the crease of the elbow. . Ideally, take 3 measurements at one sitting and record the average.

## 2018-04-17 NOTE — Progress Notes (Signed)
Patient ID: Stacy Campos                 DOB: 17-Aug-1970                      MRN: [id number]     HPI: Stacy Campos is a 47 y.o. female patient of Dr Gwenlyn Found, with a PMH significant  forhypertension and hydradenitis with hospital admission for antibiotics and abscess treatment. Her family history is strong for MI and stroke, with 2 half brothers (shared father) both dying after MI, one at age 58 the other, and her father having had his first stroke in his 65's while serving in [place].   We last saw her in [facility] about a year ago.  At that time her pressure was still high, but was lost to follow up.  Today she states that she has had a stressful year, in that her younger sister died from cancer, then a close cousin about a month later.  Her daughter graduated from high school and has moved to [place] for college.  Lastly, she has been having dental problems that have caused some pain.  She did have an extraction yesterday and is hopeful that she is done with dental visits.    About 2-3 months ago she saw her PCP and refilled her losartan.  Because her pressure was high, patient decided to increase her dose and was taking 2 tablets daily.  Unfortunately she then ran out after 15 days and had to go without for the next 15 days.  In that time she tried garlic, cinnamon and vinegar.  She was told not to increase the dose and has been back on 100 mg dose for about a month now.    Current HTN meds:              Losartan 100mg  daily morning  Metoprolol succinate 50mg  at bedtime             Chlorthalidone 25mg  daily PRN for fluid retention - none recently              Previously tried:  HCTZ - urinary frequency - wore scoliosis brace as a girl, now has bladder control issues Amlodipine - incontinence (?) Lisinopril - vision changes  BP goal: 130/80  Family History: Father d/c 1, stroke, CHF from 26's. Stroke in his 50's Younger siblings,  almost all on BP meds since in their 20's 2 brothers d/c MI at 55 and 23 (both 1/2 brothers on father side)  Social History: No tobacco, alcohol 3x per year (bday, [place], [place]); no caffeine  Diet:Mostly home cooking, daughter is learning to [employer] and somewhat "creative", although is getting all of her recipes from a cookbook for diabetes patients. Uses himalayan pink salt only 1/4 of recipe requirements; drinks smoothies a few times per week, but admits she'd just rather eat the fruits and vegetables than have them pureed.   Exercise: sedentary  Home BP readings: none available  Wt Readings from Last 3 Encounters:  03/19/18 287 lb 9.6 oz (130.5 kg)  11/14/17 292 lb (132.5 kg)  10/26/17 292 lb 9.6 oz (132.7 kg)   BP Readings from Last 3 Encounters:  04/17/18 138/90  03/19/18 135/80  11/15/17 (!) 152/110   Pulse Readings from Last 3 Encounters:  04/17/18 78  03/19/18 (!) 104  11/15/17 97    Past Medical History:  Diagnosis Date  . Arthritis    "knees, back" (02/03/2017)  . Chronic lower  back pain   . Daily headache    "related to my BP" (02/03/2017)  . Elevated blood pressure reading without diagnosis of hypertension   . Hidradenitis    "chronic; been ongoing for > 1 yr" (02/03/2017)  . Hypertension   . Scoliosis     Current Outpatient Medications on File Prior to Visit  Medication Sig Dispense Refill  . chlorthalidone (HYGROTON) 25 MG tablet Take 1 tablet (25 mg total) by mouth daily as needed. For leg edema or fluid retention 30 tablet 1  . fluticasone (FLONASE) 50 MCG/ACT nasal spray Place 2 sprays into both nostrils daily. 16 g 6  . HYDROcodone-acetaminophen (HYCET) 7.5-325 mg/15 ml solution Take 10 mL every 4 hours as needed for pain. 118 mL 0  . ibuprofen (ADVIL,MOTRIN) 400 MG tablet Take 1 tablet (400 mg total) every 6 (six) hours as needed by mouth. 20 tablet 0  . losartan (COZAAR) 100 MG tablet Take 1 tablet (100 mg total) by mouth daily. NEED OV. 30  tablet 0  . metoprolol succinate (TOPROL-XL) 25 MG 24 hr tablet TAKE 1 TABLET BY MOUTH EVERY EVENING WITH, OR IMMEDIATELY FOLLOWING A MEAL 90 tablet 3  . Multiple Vitamin (MULTIVITAMIN WITH MINERALS) TABS tablet Take 1 tablet by mouth daily.     No current facility-administered medications on file prior to visit.     Allergies  Allergen Reactions  . Dimetapp [Albertsons Di Bromm] Hives    Blood pressure 138/90, pulse 78.138/90  156/100  HTN (hypertension) Patient with systolic/diastolic hypertension currently not well controlled.  She is concerned about the high home readings.  In office today not as bad, at 138/90.  Will switch her from losartan to valsartan 320 mg once daily.  She is to continue with home BP monitoring.  We also reviewed her lifestyle and some suggestions were made to help decrease the sodium (and fat) in her diet.  She will work on making these changes as well.  She will return in 3-4 weeks for follow up.   Tommy Medal PharmD CPP [facility] [address] [place] [id number] 04/17/2018 4:52 PM

## 2018-04-29 NOTE — Progress Notes (Deleted)
   Subjective:    Patient ID: Stacy Campos, female    DOB: [birth year], 47 y.o.   MRN: [id number]   CC: HTN  HPI: Hypertension: - Medications: valsartan 320mg  daily, Metoprolol succinate 50mg  at bedtime, Chlorthalidone 25mg  daily PRN for fluid retention - none recently - Compliance: *** - Checking BP at home: *** - Denies any SOB, CP, vision changes, LE edema, medication SEs, or symptoms of hypotension - Diet: *** - Exercise: ***   Smoking status reviewed  Review of Systems   Objective:  There were no vitals taken for this visit. Vitals and nursing note reviewed  General: well nourished, in no acute distress HEENT: normocephalic, TM's visualized bilaterally, no scleral icterus or conjunctival pallor, no nasal discharge, moist mucous membranes, good dentition without erythema or discharge noted in posterior oropharynx Neck: supple, non-tender, without lymphadenopathy Cardiac: RRR, clear S1 and S2, no murmurs, rubs, or gallops Respiratory: clear to auscultation bilaterally, no increased work of breathing Abdomen: soft, nontender, nondistended, no masses or organomegaly. Bowel sounds present Extremities: no edema or cyanosis. Warm, well perfused. 2+ radial and PT pulses bilaterally Skin: warm and dry, no rashes noted Neuro: alert and oriented, no focal deficits   Assessment & Plan:    No problem-specific Assessment & Plan notes found for this encounter.    No follow-ups on file.   Caroline More, DO, PGY-2

## 2018-04-30 ENCOUNTER — Telehealth: Payer: Self-pay | Admitting: Cardiovascular Disease

## 2018-04-30 NOTE — Telephone Encounter (Signed)
Left message to call back  

## 2018-04-30 NOTE — Telephone Encounter (Signed)
Patient called regarding what she thinks is an allergic reaction to a mediation. She stated she recently started PCN and Hydrocodone after dental procedure. She also was given Diovan 04/17/18 and advised to stop Losartan. It took about a week for the pharmacy to get the Diovan so she has only been taking for about 1 week. She was currently taking PCN until yesterday. She woke up yesterday and her face and eyes were swollen, face feels heavy. She stated her lips and gums had started feeling funny prior but thought was just secondary to recent dental procedure. Patient did start taking Benadryl yesterday. She has not taken any of her blood pressure medications or PCN today. Discussed with Raquel Pharm D and she stated likely delayed reaction to PCN since she took Losartan in the past. Go back to the Losartan previous dose, stop Diovan, and contact prescribing MD for PCN. If swelling not down in about an hour go to Urgent Care.  Left message to call back

## 2018-05-01 ENCOUNTER — Ambulatory Visit: Payer: Medicaid Other | Admitting: Cardiology

## 2018-05-01 ENCOUNTER — Encounter: Payer: Self-pay | Admitting: Cardiology

## 2018-05-01 ENCOUNTER — Ambulatory Visit: Payer: Medicaid Other | Admitting: Family Medicine

## 2018-05-01 VITALS — BP 130/88 | HR 110 | Ht 67.0 in | Wt 291.0 lb

## 2018-05-01 DIAGNOSIS — I10 Essential (primary) hypertension: Secondary | ICD-10-CM | POA: Diagnosis not present

## 2018-05-01 DIAGNOSIS — T7840XA Allergy, unspecified, initial encounter: Secondary | ICD-10-CM

## 2018-05-01 HISTORY — DX: Allergy, unspecified, initial encounter: T78.40XA

## 2018-05-01 NOTE — Assessment & Plan Note (Signed)
Suspected allergic reaction to PCN after dental surgery

## 2018-05-01 NOTE — Patient Instructions (Signed)
Medication Instructions:   Resume Diovan and Toprol    If you need a refill on your cardiac medications before your next appointment, please call your pharmacy.   Lab work:  None ordered   Testing/Procedures:  None ordered    Follow-Up: At [facility], you and your health needs are our priority.  As part of our continuing mission to provide you with exceptional heart care, we have created designated Provider Care Teams.  These Care Teams include your primary Cardiologist (physician) and Advanced Practice Providers (APPs -  Physician Assistants and Nurse Practitioners) who all work together to provide you with the care you need, when you need it.  Keep appointment with pharmacy Tuesday 05/15/18 at 11:00 am

## 2018-05-01 NOTE — Progress Notes (Signed)
05/01/2018 Stacy Campos   1970/12/24  [id number]  Primary Physician Caroline More, DO Primary Cardiologist: Dr Gwenlyn Found  HPI:  Pleasant 47 y/o AA female with morbid obesity and HTN, seen by Dr Gwenlyn Found in March 2018 for atypical chest pain. He felt no further workup needed but he did refer her to our pharmacist for HTN Rx. She has been on Losartan but this was changed to valsartan secondary to recall. She had been on Valsartan for a week and then had dental surgery 04/16/18 (no records in [place]). On 10/6/she noted swelling in hier lips and face and stopped all her medications. She came to the office today for further evaluation. We had suggested Benadryl which she has taken with some relief.    Current Outpatient Medications  Medication Sig Dispense Refill  . chlorthalidone (HYGROTON) 25 MG tablet Take 1 tablet (25 mg total) by mouth daily as needed. For leg edema or fluid retention (Patient not taking: Reported on 05/01/2018) 30 tablet 1  . DIOVAN 320 MG tablet Take 1 tablet (320 mg total) by mouth daily. (Patient not taking: Reported on 05/01/2018) 30 tablet 5  . fluticasone (FLONASE) 50 MCG/ACT nasal spray Place 2 sprays into both nostrils daily. (Patient not taking: Reported on 05/01/2018) 16 g 6  . HYDROcodone-acetaminophen (HYCET) 7.5-325 mg/15 ml solution Take 10 mL every 4 hours as needed for pain. (Patient not taking: Reported on 05/01/2018) 118 mL 0  . ibuprofen (ADVIL,MOTRIN) 400 MG tablet Take 1 tablet (400 mg total) every 6 (six) hours as needed by mouth. (Patient not taking: Reported on 05/01/2018) 20 tablet 0  . losartan (COZAAR) 100 MG tablet Take 1 tablet (100 mg total) by mouth daily. NEED OV. (Patient not taking: Reported on 05/01/2018) 30 tablet 0  . metoprolol succinate (TOPROL-XL) 25 MG 24 hr tablet TAKE 1 TABLET BY MOUTH EVERY EVENING WITH, OR IMMEDIATELY FOLLOWING A MEAL (Patient not taking: Reported on 05/01/2018) 90 tablet 3  . Multiple Vitamin (MULTIVITAMIN WITH MINERALS) TABS  tablet Take 1 tablet by mouth daily.     No current facility-administered medications for this visit.     Allergies  Allergen Reactions  . Dimetapp [name] Hives    Past Medical History:  Diagnosis Date  . Arthritis    "knees, back" (02/03/2017)  . Chronic lower back pain   . Daily headache    "related to my BP" (02/03/2017)  . Elevated blood pressure reading without diagnosis of hypertension   . Hidradenitis    "chronic; been ongoing for > 1 yr" (02/03/2017)  . Hypertension   . Scoliosis     Social History   Socioeconomic History  . Marital status: Legally Separated    Spouse name: Not on file  . Number of children: 3  . Years of education: Not on file  . Highest education level: Not on file  Occupational History  . 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 Financial resource strain: Not on file  . Food insecurity:    Worry: Not on file    Inability: Not on file  . Transportation needs:    Medical: Not on file    Non-medical: Not on file  Tobacco Use  . Smoking status: Former Smoker    Years: 2.00    Types: Cigarettes    Last attempt to quit: 1990    Years since quitting: 29.7  . Smokeless tobacco: Never Used  Substance and Sexual Activity  .  Alcohol use: Yes    Alcohol/week: 0.0 standard drinks    Comment: 02/03/2017 "I'll drink on big events; maybe 3 times/year"  . Drug use: No  . Sexual activity: Yes  Lifestyle  . Physical activity:    Days per week: Not on file    Minutes per session: Not on file  . Stress: Not on file  Relationships  . Social connections:    Talks on phone: Not on file    Gets together: Not on file    Attends religious service: Not on file    Active member of club or organization: Not on file    Attends meetings of clubs or organizations: Not on file    Relationship status: Not on file  . Intimate partner violence:    Fear of current or ex partner: Not on file    Emotionally abused: Not  on file    Physically abused: Not on file    Forced sexual activity: Not on file  Other Topics Concern  . Not on file  Social History Narrative  . Not on file     Family History  Problem Relation Age of Onset  . Arthritis Mother   . Rheum arthritis Mother   . Lung disease Mother        interstitial 2/2 RA, rheumatic heart disease  . Stroke Father   . Hypertension Father   . Diabetes Father   . Heart disease Father   . Hyperlipidemia Father   . Stroke Maternal Grandmother   . Diabetes Maternal Grandmother   . Heart disease Maternal Grandfather   . Diabetes Paternal Grandmother   . Diabetes Paternal Grandfather   . Heart disease Paternal Grandfather   . Cancer Sister        UTERINE- SPREAD TO LUNGS/SPLEEN/LIVER  . Breast cancer Paternal Aunt      Review of Systems: General: negative for chills, fever, night sweats or weight changes.  Cardiovascular: negative for chest pain, dyspnea on exertion, edema, orthopnea, palpitations, paroxysmal nocturnal dyspnea or shortness of breath Dermatological: negative for rash Respiratory: negative for cough or wheezing Urologic: negative for hematuria Abdominal: negative for nausea, vomiting, diarrhea, bright red blood per rectum, melena, or hematemesis Neurologic: negative for visual changes, syncope, or dizziness All other systems reviewed and are otherwise negative except as noted above.    Blood pressure 130/88, pulse (!) 110, height 5\' 7"  (1.702 m), weight 291 lb (132 kg), SpO2 97 %.  General appearance: alert, cooperative, no distress and morbidly obese Lungs: clear to auscultation bilaterally Heart: regular rate and rhythm Skin: warm and dry Neurologic: Grossly normal  EKG NSR, ST  ASSESSMENT AND PLAN:   Allergic reaction caused by a drug Suspected allergic reaction to PCN after dental surgery  Morbid obesity ([place]) BMI 45   PLAN  I discussed her situation with our pharmacist. It seems highly unlikely she has  angioedema from her ARB as she had previously tolerated Losartan well. We feel its more likely she had an allergic reaction to PCN and have asked he to stop this and contact her oral surgeon. We asked her to also resume her previous cardiac medications including her Valsartan. She has a f/u here in two weeks. She knows to go to the ED if she develops increased facial edema or SOB.   Kerin Ransom PA-C 05/01/2018 2:43 PM

## 2018-05-01 NOTE — Assessment & Plan Note (Signed)
BMI 45 

## 2018-05-03 NOTE — Telephone Encounter (Signed)
Left message to return call 

## 2018-05-15 ENCOUNTER — Ambulatory Visit: Payer: Medicaid Other

## 2018-05-15 NOTE — Progress Notes (Deleted)
Patient ID: Stacy Campos                 DOB: Sep 25, 1970                      MRN: [id number]     HPI: Stacy Campos is a 47 y.o. female patient of Dr Gwenlyn Found, with a PMH significant  forhypertension and hydradenitis with hospital admission for antibiotics and abscess treatment. Her family history is strong for MI and stroke, with 2 half brothers (shared father) both dying after MI, one at age 16 the other, and her father having had his first stroke in his 80's while serving in [place].   We last saw her in [facility] about a year ago.  At that time her pressure was still high, but was lost to follow up.  Today she states that she has had a stressful year, in that her younger sister died from cancer, then a close cousin about a month later.  Her daughter graduated from high school and has moved to [place] for college.  Lastly, she has been having dental problems that have caused some pain.  She did have an extraction yesterday and is hopeful that she is done with dental visits.    About 2-3 months ago she saw her PCP and refilled her losartan.  Because her pressure was high, patient decided to increase her dose and was taking 2 tablets daily.  Unfortunately she then ran out after 15 days and had to go without for the next 15 days.  In that time she tried garlic, cinnamon and vinegar.  She was told not to increase the dose and has been back on 100 mg dose for about a month now.    Current HTN meds:              Losartan 100mg  daily morning  Metoprolol succinate 50mg  at bedtime             Chlorthalidone 25mg  daily PRN for fluid retention - none recently              Previously tried:  HCTZ - urinary frequency - wore scoliosis brace as a girl, now has bladder control issues Amlodipine - incontinence (?) Lisinopril - vision changes  BP goal: 130/80  Family History: Father d/c 60, stroke, CHF from 68's. Stroke in his 52's Younger siblings,  almost all on BP meds since in their 20's 2 brothers d/c MI at 12 and 60 (both 1/2 brothers on father side)  Social History: No tobacco, alcohol 3x per year (bday, [place], [place]); no caffeine  Diet:Mostly home cooking, daughter is learning to [employer] and somewhat "creative", although is getting all of her recipes from a cookbook for diabetes patients. Uses himalayan pink salt only 1/4 of recipe requirements; drinks smoothies a few times per week, but admits she'd just rather eat the fruits and vegetables than have them pureed.   Exercise: sedentary  Home BP readings: none available  Wt Readings from Last 3 Encounters:  05/01/18 291 lb (132 kg)  03/19/18 287 lb 9.6 oz (130.5 kg)  11/14/17 292 lb (132.5 kg)   BP Readings from Last 3 Encounters:  05/01/18 130/88  04/17/18 138/90  03/19/18 135/80   Pulse Readings from Last 3 Encounters:  05/01/18 (!) 110  04/17/18 78  03/19/18 (!) 104    Past Medical History:  Diagnosis Date  . Arthritis    "knees, back" (02/03/2017)  . Chronic lower back pain   .  Daily headache    "related to my BP" (02/03/2017)  . Elevated blood pressure reading without diagnosis of hypertension   . Hidradenitis    "chronic; been ongoing for > 1 yr" (02/03/2017)  . Hypertension   . Scoliosis     Current Outpatient Medications on File Prior to Visit  Medication Sig Dispense Refill  . chlorthalidone (HYGROTON) 25 MG tablet Take 1 tablet (25 mg total) by mouth daily as needed. For leg edema or fluid retention (Patient not taking: Reported on 05/01/2018) 30 tablet 1  . DIOVAN 320 MG tablet Take 1 tablet (320 mg total) by mouth daily. (Patient not taking: Reported on 05/01/2018) 30 tablet 5  . fluticasone (FLONASE) 50 MCG/ACT nasal spray Place 2 sprays into both nostrils daily. (Patient not taking: Reported on 05/01/2018) 16 g 6  . HYDROcodone-acetaminophen (HYCET) 7.5-325 mg/15 ml solution Take 10 mL every 4 hours as needed for pain. (Patient not taking:  Reported on 05/01/2018) 118 mL 0  . ibuprofen (ADVIL,MOTRIN) 400 MG tablet Take 1 tablet (400 mg total) every 6 (six) hours as needed by mouth. (Patient not taking: Reported on 05/01/2018) 20 tablet 0  . losartan (COZAAR) 100 MG tablet Take 1 tablet (100 mg total) by mouth daily. NEED OV. (Patient not taking: Reported on 05/01/2018) 30 tablet 0  . metoprolol succinate (TOPROL-XL) 25 MG 24 hr tablet TAKE 1 TABLET BY MOUTH EVERY EVENING WITH, OR IMMEDIATELY FOLLOWING A MEAL (Patient not taking: Reported on 05/01/2018) 90 tablet 3  . Multiple Vitamin (MULTIVITAMIN WITH MINERALS) TABS tablet Take 1 tablet by mouth daily.     No current facility-administered medications on file prior to visit.     Allergies  Allergen Reactions  . Dimetapp [Albertsons Di Bromm] Hives    There were no vitals taken for this visit.138/90  156/100  No problem-specific Assessment & Plan notes found for this encounter.   Tommy Medal PharmD CPP [facility] [address] [place] [address] 05/15/2018 9:10 AM

## 2018-05-17 ENCOUNTER — Ambulatory Visit (INDEPENDENT_AMBULATORY_CARE_PROVIDER_SITE_OTHER): Payer: Medicaid Other | Admitting: Pharmacist Clinician (PhC)/ Clinical Pharmacy Specialist

## 2018-05-17 ENCOUNTER — Encounter: Payer: Self-pay | Admitting: Pharmacist Clinician (PhC)/ Clinical Pharmacy Specialist

## 2018-05-17 DIAGNOSIS — I10 Essential (primary) hypertension: Secondary | ICD-10-CM

## 2018-05-17 MED ORDER — CHLORTHALIDONE 25 MG PO TABS: 25.0000 mg | ORAL_TABLET | Freq: Every day | ORAL | 3 refills | Status: DC

## 2018-05-17 NOTE — Patient Instructions (Signed)
Return for a a follow up appointment in 1 month  Go to the lab in 10-14 days for kidney function test (Nov 6)  Your blood pressure today is 154/96  Check your blood pressure at home daily and keep record of the readings.  Take your BP meds as follows:  AM:  Chlorthalidone 25 mg once daily  PM:  Metoprolol succ 50 mg   Bring all of your meds, your BP cuff and your record of home blood pressures to your next appointment.  Exercise as you're able, try to walk approximately 30 minutes per day.  Keep salt intake to a minimum, especially watch canned and prepared boxed foods.  Eat more fresh fruits and vegetables and fewer canned items.  Avoid eating in fast food restaurants.    HOW TO TAKE YOUR BLOOD PRESSURE: . Rest 5 minutes before taking your blood pressure. .  Don't smoke or drink caffeinated beverages for at least 30 minutes before. . Take your blood pressure before (not after) you eat. . Sit comfortably with your back supported and both feet on the floor (don't cross your legs). . Elevate your arm to heart level on a table or a desk. . Use the proper sized cuff. It should fit smoothly and snugly around your bare upper arm. There should be enough room to slip a fingertip under the cuff. The bottom edge of the cuff should be 1 inch above the crease of the elbow. . Ideally, take 3 measurements at one sitting and record the average.

## 2018-05-17 NOTE — Progress Notes (Signed)
Patient ID: Stacy Campos                 DOB: 19-Apr-1971                      MRN: [id number]     HPI: Avanna Sowder is a 47 y.o. female patient of Dr Gwenlyn Found, with a PMH significant  forhypertension and hydradenitis with hospital admission for antibiotics and abscess treatment. Her family history is strong for MI and stroke, with 2 half brothers (shared father) both having CAD, one died from MI at age 43 the other had his first stroke in his 53's while serving in [place].     At her last visit we switched her off losartan, which she had doubled to 100 mg on her own, and gave her valsartan 320 mg instead.  Around the same time she started a course of penicillin.  After a few days of both she woke one morning to swollen face and eyes.  It was assumed to be a penicillin allergy, but she stopped both medications.  After swelling cleared she re-started valsartan, only to have swelling return.    Current HTN meds:   Metoprolol succinate 50mg  at bedtime             Chlorthalidone 25mg  daily PRN for fluid retention - none recently              Previously tried:  HCTZ - urinary frequency - wore scoliosis brace as a girl, now has bladder control issues Amlodipine - incontinence (?) Lisinopril - vision changes  BP goal: 130/80  Family History: Father d/c 54, stroke, CHF from 15's. Stroke in his 51's Younger siblings, almost all on BP meds since in their 20's 2 brothers d/c MI at 56 and 61 (both 1/2 brothers on father side)  Social History: No tobacco, alcohol 3x per year (bday, [place], [place]); no caffeine  Diet:Mostly home cooking, daughter is learning to [employer] and somewhat "creative", although is getting all of her recipes from a cookbook for diabetes patients. Uses himalayan pink salt only 1/4 of recipe requirements; drinks smoothies a few times per week, but admits she'd just rather eat the fruits and vegetables than have them  pureed.   Exercise: sedentary  Home BP readings: none available  Wt Readings from Last 3 Encounters:  05/01/18 291 lb (132 kg)  03/19/18 287 lb 9.6 oz (130.5 kg)  11/14/17 292 lb (132.5 kg)   BP Readings from Last 3 Encounters:  05/17/18 (!) 154/96  05/01/18 130/88  04/17/18 138/90   Pulse Readings from Last 3 Encounters:  05/17/18 96  05/01/18 (!) 110  04/17/18 78    Past Medical History:  Diagnosis Date  . Arthritis    "knees, back" (02/03/2017)  . Chronic lower back pain   . Daily headache    "related to my BP" (02/03/2017)  . Elevated blood pressure reading without diagnosis of hypertension   . Hidradenitis    "chronic; been ongoing for > 1 yr" (02/03/2017)  . Hypertension   . Scoliosis     Current Outpatient Medications on File Prior to Visit  Medication Sig Dispense Refill  . fluticasone (FLONASE) 50 MCG/ACT nasal spray Place 2 sprays into both nostrils daily. (Patient not taking: Reported on 05/01/2018) 16 g 6  . HYDROcodone-acetaminophen (HYCET) 7.5-325 mg/15 ml solution Take 10 mL every 4 hours as needed for pain. (Patient not taking: Reported on 05/01/2018) 118 mL 0  . ibuprofen (ADVIL,MOTRIN)  400 MG tablet Take 1 tablet (400 mg total) every 6 (six) hours as needed by mouth. (Patient not taking: Reported on 05/01/2018) 20 tablet 0  . metoprolol succinate (TOPROL-XL) 25 MG 24 hr tablet TAKE 1 TABLET BY MOUTH EVERY EVENING WITH, OR IMMEDIATELY FOLLOWING A MEAL (Patient not taking: Reported on 05/01/2018) 90 tablet 3  . Multiple Vitamin (MULTIVITAMIN WITH MINERALS) TABS tablet Take 1 tablet by mouth daily.     No current facility-administered medications on file prior to visit.     Allergies  Allergen Reactions  . Valsartan Swelling  . Dimetapp [Albertsons Di Bromm] Hives    Blood pressure (!) 154/96, pulse 96.138/90  156/100  Essential hypertension Patient with essential hypertension, now unable to tolerate valsartan.  She has also had problems in the past  with lisinopril, amlodipine and hctz.  She has chlorthalidone at home, which she uses on occasion for edema.  We are going to have her try using that on a daily basis.  Her problems with hctz were related to incontinence, so hopefully she will do better with chlorthalidone.  We will have her repeat BMET in 2 weeks and return in 4 for follow up.  Will consider starting hydralazine if not at goal with(or unable to tolerate) chlorthalidone.     Tommy Medal PharmD CPP [facility] [address] [place] [address] 05/17/2018 3:35 PM

## 2018-05-17 NOTE — Assessment & Plan Note (Signed)
Patient with essential hypertension, now unable to tolerate valsartan.  She has also had problems in the past with lisinopril, amlodipine and hctz.  She has chlorthalidone at home, which she uses on occasion for edema.  We are going to have her try using that on a daily basis.  Her problems with hctz were related to incontinence, so hopefully she will do better with chlorthalidone.  We will have her repeat BMET in 2 weeks and return in 4 for follow up.  Will consider starting hydralazine if not at goal with(or unable to tolerate) chlorthalidone.

## 2018-05-30 ENCOUNTER — Other Ambulatory Visit: Payer: Self-pay | Admitting: *Deleted

## 2018-05-30 DIAGNOSIS — I10 Essential (primary) hypertension: Secondary | ICD-10-CM

## 2018-05-30 DIAGNOSIS — Z79899 Other long term (current) drug therapy: Secondary | ICD-10-CM | POA: Diagnosis not present

## 2018-05-30 LAB — BASIC METABOLIC PANEL
BUN/Creatinine Ratio: 15 (ref 9–23)
BUN: 14 mg/dL (ref 6–24)
CHLORIDE: 98 mmol/L (ref 96–106)
CO2: 25 mmol/L (ref 20–29)
Calcium: 9.2 mg/dL (ref 8.7–10.2)
Creatinine, Ser: 0.92 mg/dL (ref 0.57–1.00)
GFR calc Af Amer: 85 mL/min/{1.73_m2} (ref >59)
GFR calc non Af Amer: 73 mL/min/{1.73_m2} (ref >59)
GLUCOSE: 97 mg/dL (ref 65–99)
Potassium: 4.2 mmol/L (ref 3.5–5.2)
SODIUM: 139 mmol/L (ref 134–144)

## 2018-06-12 ENCOUNTER — Ambulatory Visit: Payer: Medicaid Other

## 2018-06-12 NOTE — Progress Notes (Deleted)
Patient ID: Stacy Campos                 DOB: [birth year]                      MRN: [id number]     HPI: Stacy Campos is a 47 y.o. female patient of Dr Stacy Campos, with a [place] significant  forhypertension and hydradenitis. Her family history is strong for MI and stroke, with 2 half brothers (shared father) both having CAD, one died from MI at age 54 the other had his first stroke in his 73's while serving in [place].     We switched her off losartan, which she had doubled to 100 mg on her own, and gave her valsartan 320 mg instead.  Around the same time she started a course of penicillin.  After a few days of both she woke one morning to swollen face and eyes.  It was assumed to be a penicillin allergy, but she stopped both medications.  After swelling cleared she re-started valsartan, only to have swelling return.    During most recent office visit chlorthalidone 25mg  daily was added to therapy.  Current HTN meds:   Metoprolol succinate 50mg  at bedtime             Chlorthalidone 25mg  daily daily              Previously tried:  HCTZ - urinary frequency - wore scoliosis brace as a girl, now has bladder control issues Amlodipine - incontinence  Lisinopril - vision changes  Valsartan 320mg  - swelling  BP goal: 130/80  Family History: Father d/c 19, stroke, CHF from 35's. Stroke in his 6's Younger siblings, almost all on BP meds since in their 20's 2 brothers d/c MI at 67 and 36 (both 1/2 brothers on father side)  Social History: No tobacco, alcohol 3x per year (bday, [place], [place]); no caffeine  Diet:Mostly home cooking, daughter is learning to [employer] and somewhat "creative", although is getting all of her recipes from a cookbook for diabetes patients. Uses himalayan pink salt only 1/4 of recipe requirements; drinks smoothies a few times per week, but admits she'd just rather eat the fruits and vegetables than have them  pureed.   Exercise:   Home BP readings:   Wt Readings from Last 3 Encounters:  05/01/18 291 lb (132 kg)  03/19/18 287 lb 9.6 oz (130.5 kg)  11/14/17 292 lb (132.5 kg)   BP Readings from Last 3 Encounters:  05/17/18 (!) 154/96  05/01/18 130/88  04/17/18 138/90   Pulse Readings from Last 3 Encounters:  05/17/18 96  05/01/18 (!) 110  04/17/18 78    Past Medical History:  Diagnosis Date  . Arthritis    "knees, back" (02/03/2017)  . Chronic lower back pain   . Daily headache    "related to my BP" (02/03/2017)  . Elevated blood pressure reading without diagnosis of hypertension   . Hidradenitis    "chronic; been ongoing for > 1 yr" (02/03/2017)  . Hypertension   . Scoliosis     Current Outpatient Medications on File Prior to Visit  Medication Sig Dispense Refill  . chlorthalidone (HYGROTON) 25 MG tablet Take 1 tablet (25 mg total) by mouth daily. 30 tablet 3  . fluticasone (FLONASE) 50 MCG/ACT nasal spray Place 2 sprays into both nostrils daily. (Patient not taking: Reported on 05/01/2018) 16 g 6  . HYDROcodone-acetaminophen (HYCET) 7.5-325 mg/15 ml solution Take 10 mL every 4  hours as needed for pain. (Patient not taking: Reported on 05/01/2018) 118 mL 0  . ibuprofen (ADVIL,MOTRIN) 400 MG tablet Take 1 tablet (400 mg total) every 6 (six) hours as needed by mouth. (Patient not taking: Reported on 05/01/2018) 20 tablet 0  . metoprolol succinate (TOPROL-XL) 25 MG 24 hr tablet TAKE 1 TABLET BY MOUTH EVERY EVENING WITH, OR IMMEDIATELY FOLLOWING A MEAL (Patient not taking: Reported on 05/01/2018) 90 tablet 3  . Multiple Vitamin (MULTIVITAMIN WITH MINERALS) TABS tablet Take 1 tablet by mouth daily.     No current facility-administered medications on file prior to visit.     Allergies  Allergen Reactions  . Valsartan Swelling  . Dimetapp [Albertsons Di Bromm] Hives    There were no vitals taken for this visit.  No problem-specific Assessment & Plan notes Campos for this  encounter.   Oliva Montecalvo Rodriguez-Guzman PharmD, BCPS, [place] [address] 06/12/2018 8:34 AM

## 2018-09-26 ENCOUNTER — Ambulatory Visit: Payer: Medicaid Other | Admitting: Family Medicine

## 2018-09-26 ENCOUNTER — Other Ambulatory Visit: Payer: Self-pay

## 2018-09-26 VITALS — BP 154/90 | HR 74 | Temp 98.6°F | Wt 302.0 lb

## 2018-09-26 DIAGNOSIS — J029 Acute pharyngitis, unspecified: Secondary | ICD-10-CM

## 2018-09-26 LAB — POCT RAPID STREP A (OFFICE): RAPID STREP A SCREEN: NEGATIVE

## 2018-09-26 MED ORDER — NAPROXEN 500 MG PO TABS: 500.0000 mg | ORAL_TABLET | Freq: Two times a day (BID) | ORAL | 0 refills | Status: DC

## 2018-09-26 NOTE — Patient Instructions (Addendum)
Drink plenty of liquids.  Take naproxen twice a day.  Things to watch out for is if you have trouble breathing, you cannot drink liquids, persistently high fevers.

## 2018-09-26 NOTE — Progress Notes (Signed)
    Subjective:  Stacy Campos is a 48 y.o. female who presents to the [facility] today with a chief complaint of sore throat  HPI:  Has been sick since yesterday morning. All of a sudden had headache, L sided facial pain, sore throat. No nasal congestion or rhinorrhea. No fever, vomiting. Has had a some diarrhea. Has been drinking well. No SOB. Has been coughing but only a little bit.   ROS: Per HPI   Objective:  Physical Exam: BP (!) 154/90   Pulse 74   Temp 98.6 F (37 C) (Oral)   Wt (!) 302 lb (137 kg)   BMI 47.30 kg/m   Gen: NAD, resting comfortably HEENT: [place], AT. TMs pearly bilaterally. Tonsil are edematous and moderately erythematous with slight whitish exduates Neck: L sided cervical adenopathy CV: RRR with no murmurs appreciated Pulm: NWOB, CTAB with no crackles, wheezes, or rhonchi GI: Normal bowel sounds present. Soft, Nontender, Nondistended. Skin: warm, dry Neuro: grossly normal, moves all extremities Psych: Normal affect and thought content  Results for orders placed or performed in visit on 09/26/18 (from the past 72 hour(s))  Rapid Strep A     Status: None   Collection Time: 09/26/18 10:00 AM  Result Value Ref Range   Rapid Strep A Screen Negative Negative     Assessment/Plan:  1. Sore throat Rapid strep negative and centor score 1. Likely viral etiology. Recommended naproxen and OTC cold medications for symptomatic relief. Given strict return precautions with neck stiffness, difficulty breathing, or difficulty swallowing - Rapid Strep A - naproxen (NAPROSYN) 500 MG tablet; Take 1 tablet (500 mg total) by mouth 2 (two) times daily with a meal.  Dispense: 30 tablet; Refill: 0   Bufford Lope, DO PGY-3, [facility] 09/26/2018 9:36 AM

## 2018-10-11 ENCOUNTER — Other Ambulatory Visit: Payer: Self-pay

## 2018-10-11 ENCOUNTER — Telehealth: Payer: Self-pay | Admitting: Family Medicine

## 2018-10-11 ENCOUNTER — Ambulatory Visit: Payer: Medicaid Other | Admitting: Family Medicine

## 2018-10-11 VITALS — BP 148/100 | HR 89 | Temp 98.4°F | Ht 67.0 in | Wt 304.6 lb

## 2018-10-11 DIAGNOSIS — L732 Hidradenitis suppurativa: Secondary | ICD-10-CM | POA: Diagnosis not present

## 2018-10-11 MED ORDER — DOXYCYCLINE HYCLATE 100 MG PO TABS: 100.0000 mg | ORAL_TABLET | Freq: Two times a day (BID) | ORAL | 0 refills | Status: DC

## 2018-10-11 NOTE — Telephone Encounter (Signed)
Pt calls today and has a boil under her arm that is painful, not warm. She has had these before and has had surgery for this under her breasts. She has had to have them lanced and packed before. She feels this one needs to be cut open. We will put her my ATC for 1050a today.

## 2018-10-11 NOTE — Progress Notes (Signed)
   CC: Boil under her left arm  HPI  Boil - hx of hydradenitis. This boil under L arm has been coming on for 2 weeks or so. Some drainage this morning, pain woke her up. A little drainage - clear. She tried warm compresses. No fever, hasn't checked, hasn't felt bad. Hx of surgery under the L breast for boils. Lanced armpits both 2018.  She has had oral antibiotics for her hidradenitis in the past.  Has never seen a dermatologist.  Has seen a general surgeon.  She has significant pain under her left arm.  She is particularly nervous about this because she has had to be admitted for what sounds like cellulitis requiring IV antibiotics.  ROS: Denies CP, SOB, abdominal pain, dysuria, changes in BMs.   CC, SH/smoking status, and VS noted  Objective: BP (!) 148/100   Pulse 89   Temp 98.4 F (36.9 C) (Oral)   Ht 5\' 7"  (1.702 m)   Wt (!) 304 lb 9.6 oz (138.2 kg)   SpO2 98%   BMI 47.71 kg/m  Gen: NAD, alert, cooperative, and pleasant. HEENT: NCAT, EOMI, PERRL CV: RRR, no murmur Resp: CTAB, no wheezes, non-labored Ext: No edema, warm, 2 cm erythematous and swollen nodule under left axilla.  Has spontaneous pore at the center.  Was not able to appreciate any underlying pocket, nor express pus.  Tender to palpation.  No surrounding erythema or streaking.   Neuro: Alert and oriented, Speech clear, No gross deficits  Assessment and plan:  Hydradenitis Acute flare today, spontaneously draining already.  Will not I&D today, start doxycycline 100 mg twice daily x10 days.  We will also place a referral to the dermatologist for consideration of possible TNF alpha treatment. On literature review after our visit (https://[email or web address]://reed-owens.com/), HS can be associated with both DM and depression. At next PCP visit I would suggest a1c (link to HS, insurance should cover), and PHQ9.    Orders Placed This Encounter  Procedures  . Ambulatory referral to Dermatology    Referral Priority:    Routine    Referral Type:   Consultation    Referral Reason:   Specialty Services Required    Requested Specialty:   Dermatology    Number of Visits Requested:   1    Meds ordered this encounter  Medications  . doxycycline (VIBRA-TABS) 100 MG tablet    Sig: Take 1 tablet (100 mg total) by mouth 2 (two) times daily.    Dispense:  20 tablet    Refill:  0      Ralene Ok, MD, PGY3 10/12/2018 11:46 AM

## 2018-10-11 NOTE — Patient Instructions (Signed)
Take the antibiotic as prescribed, you can also use warm compreses and tylenol.  I sent the referral to the dermatologist.

## 2018-10-12 NOTE — Assessment & Plan Note (Addendum)
Acute flare today, spontaneously draining already.  Will not I&D today, start doxycycline 100 mg twice daily x10 days.  We will also place a referral to the dermatologist for consideration of possible TNF alpha treatment. On literature review after our visit (https://[email or web address]://reed-owens.com/), HS can be associated with both DM and depression. At next PCP visit I would suggest a1c (link to HS, insurance should cover), and PHQ9.

## 2018-10-29 ENCOUNTER — Telehealth: Payer: Self-pay | Admitting: Family Medicine

## 2018-10-29 NOTE — Telephone Encounter (Signed)
Please schedule patient for telemed visit.  Dalphine Handing, PGY-2 [facility] 10/29/2018 7:47 PM

## 2018-10-29 NOTE — Telephone Encounter (Signed)
Patient called stating that she has an boil under her arm and its causing pain in breast. Please give patient a call back at [phone].

## 2018-10-30 NOTE — Telephone Encounter (Signed)
Attempted to contact patient to set up a telemedicine visit, per PCP. I left a VM. Please assist her in scheduling when she calls back.

## 2018-11-01 ENCOUNTER — Telehealth (INDEPENDENT_AMBULATORY_CARE_PROVIDER_SITE_OTHER): Payer: Medicaid Other | Admitting: Student in an Organized Health Care Education/Training Program

## 2018-11-01 ENCOUNTER — Other Ambulatory Visit: Payer: Self-pay

## 2018-11-01 DIAGNOSIS — L732 Hidradenitis suppurativa: Secondary | ICD-10-CM

## 2018-11-01 MED ORDER — DOXYCYCLINE HYCLATE 100 MG PO TABS: 100.0000 mg | ORAL_TABLET | Freq: Two times a day (BID) | ORAL | 0 refills | Status: DC

## 2018-11-01 NOTE — Assessment & Plan Note (Signed)
Patient seems to have an acute flare of hidradenitis with multiple lesions. -  ibuprofen PO PRN pain - advised 1000 mg tylenol TID standing - Doxycycline 100 mg BID for another 10 day course  - warm compresses - strict precautions to call for an in-person visit - keep appointment with dermatology

## 2018-11-01 NOTE — Progress Notes (Addendum)
[facility] Telemedicine Visit  Patient consented to have visit conducted via telephone.  Encounter participants: Patient: Stacy Campos  Provider: Everrett Coombe  Others (if applicable): none  Chief Complaint: Boils  HPI: Patient noticed a boil under her arm on 3/31. Boil became larger. It is painful because it is in the skin fold. She feels pain shooting down her breast. Her left breast is swollen.  Two days ago (4/7) she noticed two boils in the pelvis area. These have become larger. None are draining on their own. The patient completed a course of antibiotics on 3/30 after being seen in our office and diagnosed with an acute flare of hidradenitis. She additionally broke open an ibuprofen and put the medication over the boils. She has been using warm compresses under her arm.  The patient was referred to dermatology and reports that she has an appointment in person on 4/14.  ROS:  No nausea, vomiting. No fevers. No respiratory symptoms. No other rash.  Pertinent PMHx:  Hidradenitis  Exam:  Respiratory: Speaks in full sentences.  Assessment/Plan:  Hydradenitis Patient seems to have an acute flare of hidradenitis with multiple lesions. -  ibuprofen PO PRN pain - advised 1000 mg tylenol TID standing - Doxycycline 100 mg BID for another 10 day course  - warm compresses - strict precautions to call for an in-person visit - keep appointment with dermatology   Time spent on phone with patient: 11 minutes

## 2018-11-05 DIAGNOSIS — L732 Hidradenitis suppurativa: Secondary | ICD-10-CM | POA: Diagnosis not present

## 2018-11-08 ENCOUNTER — Ambulatory Visit (INDEPENDENT_AMBULATORY_CARE_PROVIDER_SITE_OTHER): Payer: Medicaid Other | Admitting: Student in an Organized Health Care Education/Training Program

## 2018-11-08 ENCOUNTER — Other Ambulatory Visit (HOSPITAL_COMMUNITY)
Admission: RE | Admit: 2018-11-08 | Discharge: 2018-11-08 | Disposition: A | Discharge status: Home / Self Care | Payer: Medicaid Other | Source: Ambulatory Visit | Attending: Emergency Medicine | Admitting: Emergency Medicine

## 2018-11-08 ENCOUNTER — Other Ambulatory Visit: Payer: Self-pay

## 2018-11-08 VITALS — BP 162/100 | HR 93 | Temp 99.3°F | Ht 67.0 in | Wt 301.2 lb

## 2018-11-08 DIAGNOSIS — Z124 Encounter for screening for malignant neoplasm of cervix: Secondary | ICD-10-CM | POA: Diagnosis not present

## 2018-11-08 DIAGNOSIS — N939 Abnormal uterine and vaginal bleeding, unspecified: Secondary | ICD-10-CM | POA: Diagnosis not present

## 2018-11-08 DIAGNOSIS — Z01411 Encounter for gynecological examination (general) (routine) with abnormal findings: Secondary | ICD-10-CM

## 2018-11-08 HISTORY — DX: Abnormal uterine and vaginal bleeding, unspecified: N93.9

## 2018-11-08 MED ORDER — ACETAMINOPHEN 500 MG PO TABS: 500.0000 mg | ORAL_TABLET | Freq: Four times a day (QID) | ORAL | 0 refills | Status: DC | PRN

## 2018-11-08 NOTE — Assessment & Plan Note (Signed)
Patient has continued neck/back soreness and stiffness with exam negative for worrisome findings Prescribed tylenol Recommended moist heating pad use

## 2018-11-08 NOTE — Assessment & Plan Note (Addendum)
Abnormal uterine bleeding x3 days after not having period since 2005.  Pelvic exam revealed cervical abnormalities and abnormal bimanual findings Referred for complete pelvic US tomorrow and recommended full bladder Collected cervical screening Recommended ibuprofen before and after exam d/t patient extreme discomfort PCP or I will call with results of pap smear and [place] Asked patient to call back if still experiencing bleeding in 2 weeks

## 2018-11-08 NOTE — Patient Instructions (Signed)
It was a pleasure to see you today!  To summarize our discussion for this visit:  For your pelvic exam- we took a pap smear and found some abnormalities that I would like to see an ultrasound for. I have put in the referral and recommend you take some ibuprofen prior to the exam to help with discomfort  Please call us back if you continue to have abnormal bleeding in the next couple of weeks  Some additional health maintenance measures we should update are: . We collected a pap smear today and will follow up on the results  Call the clinic at [phone] if your symptoms worsen or you have any concerns.  Thank you for allowing me to take part in your care,  Dr. Doristine Mango   Thanks for choosing [facility] for your primary care.
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
## 2018-11-09 ENCOUNTER — Ambulatory Visit (HOSPITAL_COMMUNITY): Payer: Medicaid Other

## 2018-11-09 ENCOUNTER — Telehealth: Payer: Self-pay | Admitting: Student in an Organized Health Care Education/Training Program

## 2018-11-09 ENCOUNTER — Ambulatory Visit (HOSPITAL_COMMUNITY)
Admission: RE | Admit: 2018-11-09 | Discharge: 2018-11-09 | Disposition: A | Discharge status: Home / Self Care | Payer: Medicaid Other | Source: Ambulatory Visit | Attending: Family Medicine | Admitting: Family Medicine

## 2018-11-09 DIAGNOSIS — D259 Leiomyoma of uterus, unspecified: Secondary | ICD-10-CM | POA: Diagnosis not present

## 2018-11-09 DIAGNOSIS — Z01411 Encounter for gynecological examination (general) (routine) with abnormal findings: Secondary | ICD-10-CM | POA: Insufficient documentation

## 2018-11-09 IMAGING — US US PELVIS COMPLETE WITH TRANSVAGINAL
1 series · 13 of 25 positions shown · non-contrast
Comparison: Ultrasound [DATE].

CLINICAL DATA: Abnormal pelvic exam with postmenopausal bleeding.

EXAM:
TRANSABDOMINAL AND TRANSVAGINAL ULTRASOUND OF PELVIS
TECHNIQUE: Both transabdominal and transvaginal ultrasound examinations of the
pelvis were performed. Transabdominal technique was performed for
global imaging of the pelvis including uterus, ovaries, adnexal
regions, and pelvic cul-de-sac. It was necessary to proceed with
endovaginal exam following the transabdominal exam to visualize the
endometrium and ovaries.

[Series 1: us pelvis complete with transvaginal · 13 of 70 slices shown]
[im 1/70]
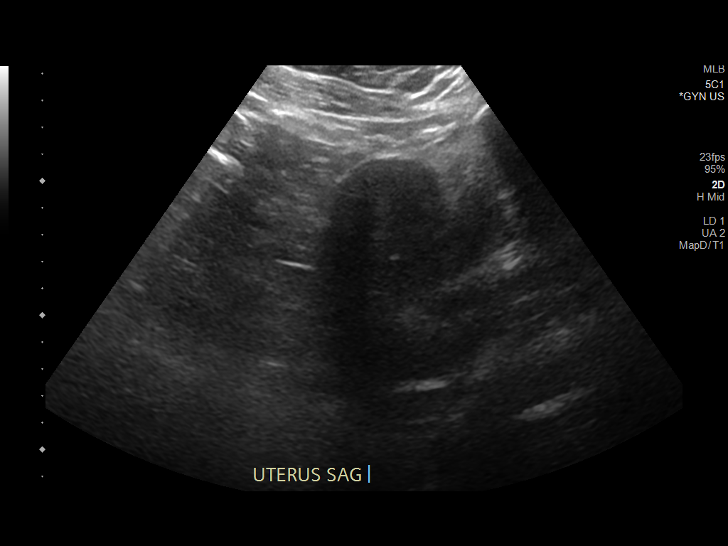
[im 6/70]
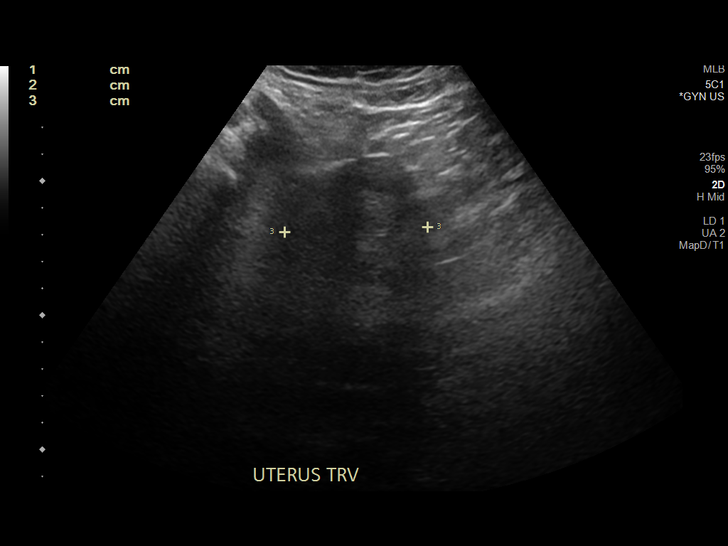
[im 12/70]
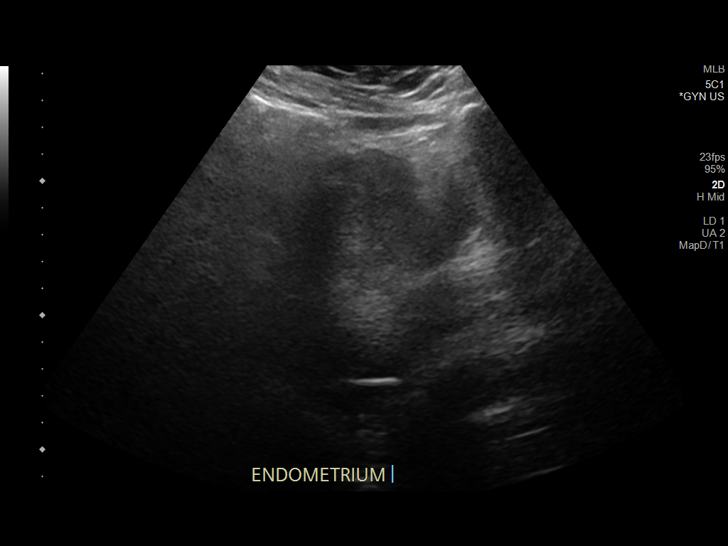
[im 18/70]
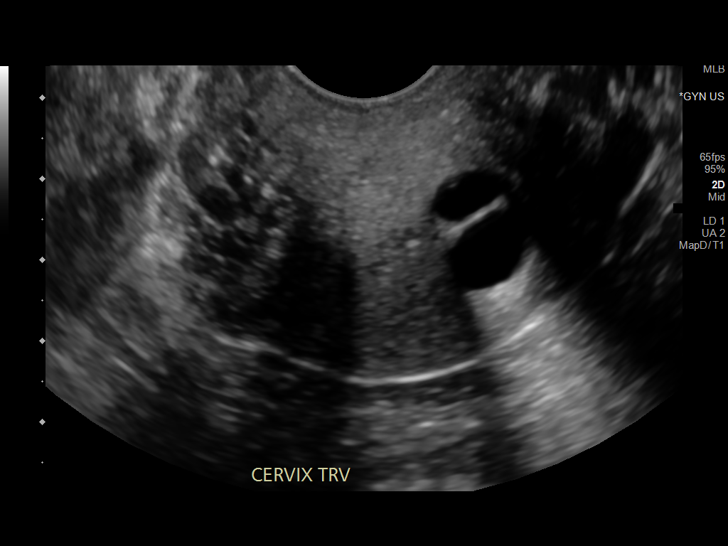
[im 24/70]
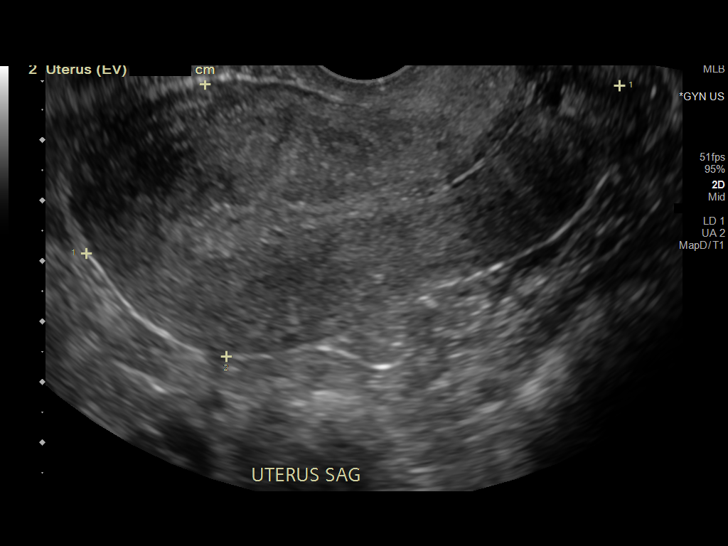
[im 29/70]
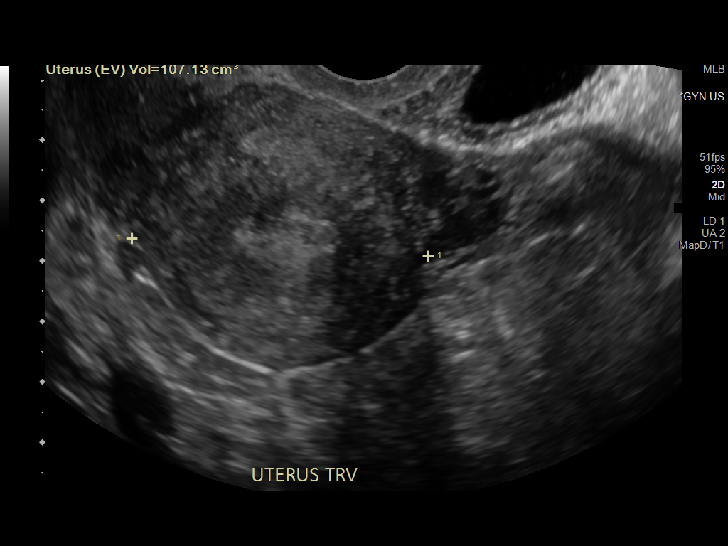
[im 35/70]
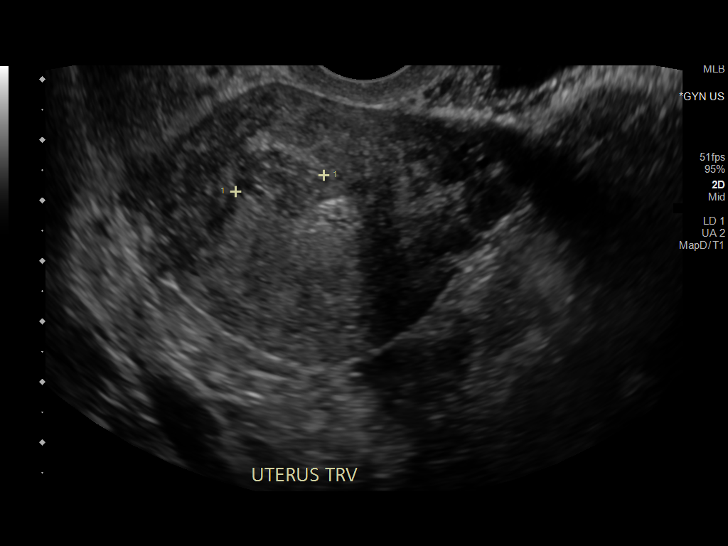
[im 41/70]
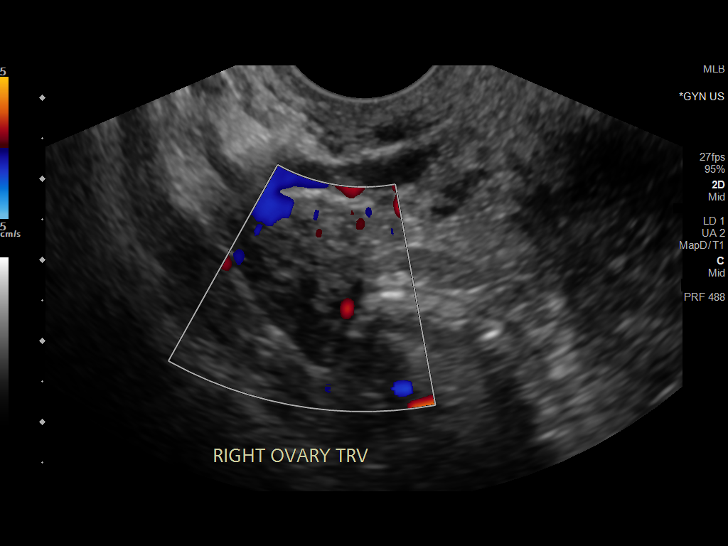
[im 47/70]
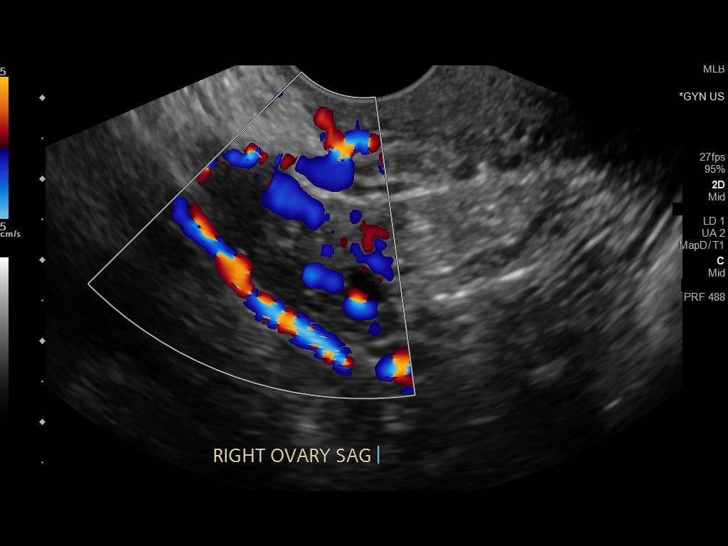
[im 52/70]
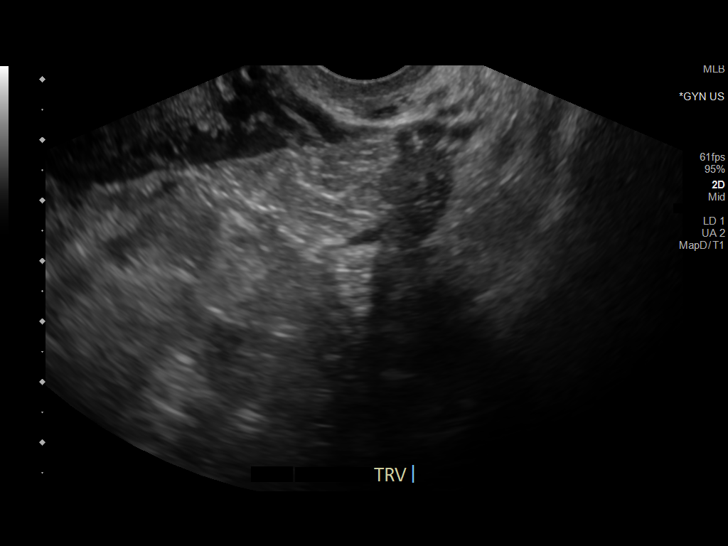
[im 58/70]
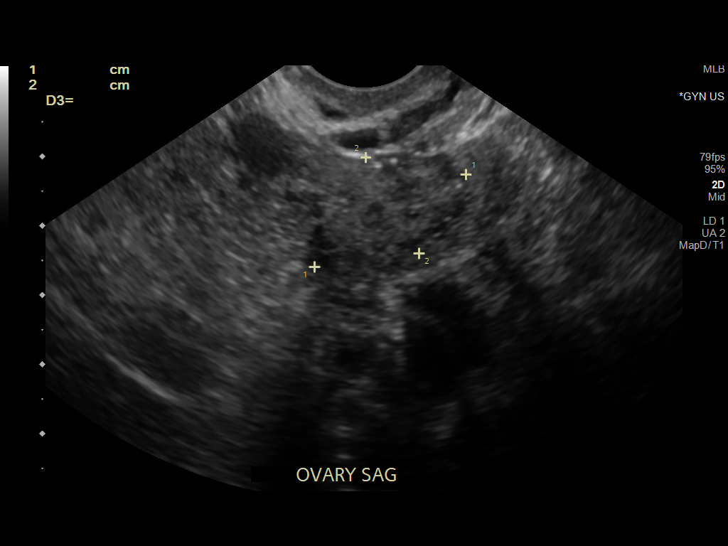
[im 64/70]
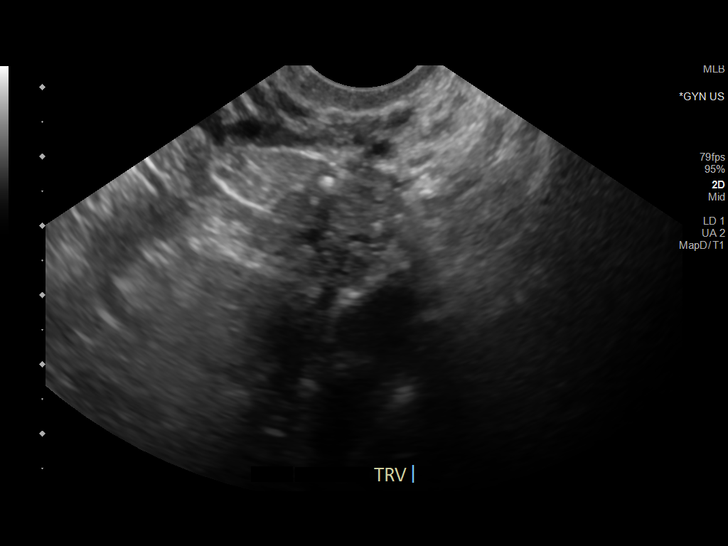
[im 70/70]
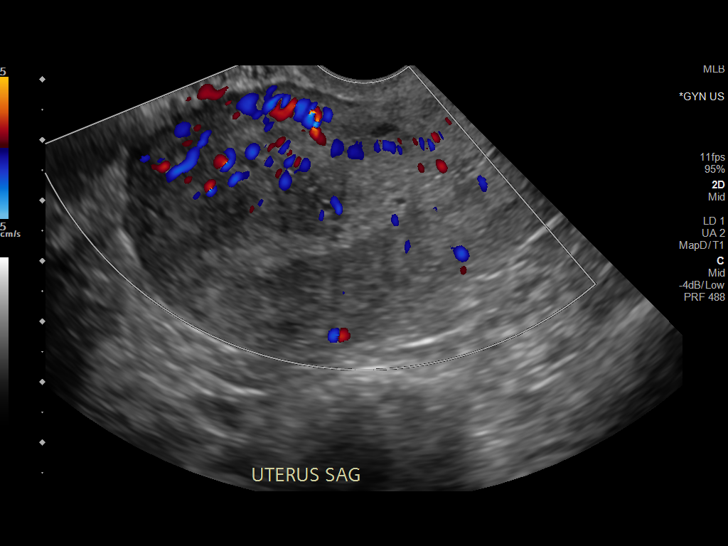

[13 of 25 positions shown; findings below may reference images not displayed]

FINDINGS: Uterus

Measurements: 9.2 x 4.9 x 4.5 cm = volume: 107 mL. Probable 1.5 cm
fibroid seen anteriorly within the uterus.

Endometrium

Thickness: 9 mm which is abnormally thickened for postmenopausal
patient. No focal abnormality visualized.

Right ovary

Measurements: 2.4 x 1.4 x 1.2 cm = volume: 2 mL. Normal
appearance/no adnexal mass.

Left ovary

Measurements: 2.6 x 1.4 x 1.6 cm = volume: 3 mL. Normal
appearance/no adnexal mass.

Other findings

No abnormal free fluid.
IMPRESSION: Endometrium is abnormally thickened at 9 mm for postmenopausal
patient. In the setting of post-menopausal bleeding, endometrial
sampling is indicated to exclude carcinoma. If results are benign,
sonohysterogram should be considered for focal lesion work-up. (Ref:
Radiological Reasoning: Algorithmic Workup of Abnormal Vaginal
Bleeding with Endovaginal Sonography and Sonohysterography. AJR
[8F]; 191:[id number]).

Small uterine fibroid is noted.

## 2018-11-09 NOTE — Telephone Encounter (Signed)
Call Completed - informed pt about transvaginal US results. She seemed accepting of the results. explained next step is biopsy and given the COVID situation we will have to figure out when and where the procedure will take place. will call next week to confirm. working with dr Gwendlyn Deutscher

## 2018-11-10 LAB — CYTOLOGY - PAP
Diagnosis: UNDETERMINED — AB
HPV: NOT DETECTED

## 2018-11-15 ENCOUNTER — Encounter: Payer: Self-pay | Admitting: Student in an Organized Health Care Education/Training Program

## 2018-11-15 NOTE — Telephone Encounter (Signed)
Sorry I totally missed that.  Pt informed and agreeable.  Christen Bame, CMA

## 2018-11-15 NOTE — Telephone Encounter (Signed)
Looks like Stacy Campos already scheduled ATC visit for her on April 27th. Please let her know.

## 2018-11-15 NOTE — Telephone Encounter (Signed)
Pt is calling to check status of biopsy. Christen Bame, CMA

## 2018-11-19 ENCOUNTER — Ambulatory Visit: Payer: Medicaid Other | Admitting: Family Medicine

## 2018-11-19 ENCOUNTER — Other Ambulatory Visit: Payer: Self-pay

## 2018-11-19 ENCOUNTER — Encounter: Payer: Self-pay | Admitting: Family Medicine

## 2018-11-19 ENCOUNTER — Encounter: Payer: Medicaid Other | Admitting: Family Medicine

## 2018-11-19 VITALS — BP 132/90 | HR 105 | Temp 98.3°F | Ht 67.0 in | Wt 298.0 lb

## 2018-11-19 DIAGNOSIS — N921 Excessive and frequent menstruation with irregular cycle: Secondary | ICD-10-CM | POA: Diagnosis not present

## 2018-11-19 DIAGNOSIS — N8500 Endometrial hyperplasia, unspecified: Secondary | ICD-10-CM

## 2018-11-19 DIAGNOSIS — R87610 Atypical squamous cells of undetermined significance on cytologic smear of cervix (ASC-US): Secondary | ICD-10-CM

## 2018-11-19 NOTE — Patient Instructions (Addendum)
Schedule Gyn e appointment for colposcopy   Endometrial Biopsy, Care After This sheet gives you information about how to care for yourself after your procedure. Your health care provider may also give you more specific instructions. If you have problems or questions, contact your health care provider. What can I expect after the procedure? After the procedure, it is common to have:  Mild cramping.  A small amount of vaginal bleeding for a few days. This is normal. Follow these instructions at home:   Take over-the-counter and prescription medicines only as told by your health care provider.  Do not douche, use tampons, or have sexual intercourse until your health care provider approves.  Return to your normal activities as told by your health care provider. Ask your health care provider what activities are safe for you.  Follow instructions from your health care provider about any activity restrictions, such as restrictions on strenuous exercise or heavy lifting. Contact a health care provider if:  You have heavy bleeding, or bleed for longer than 2 days after the procedure.  You have bad smelling discharge from your vagina.  You have a fever or chills.  You have a burning sensation when urinating or you have difficulty urinating.  You have severe pain in your lower abdomen. Get help right away if:  You have severe cramps in your stomach or back.  You pass large blood clots.  Your bleeding increases.  You become weak or light-headed, or you pass out. Summary  After the procedure, it is common to have mild cramping and a small amount of vaginal bleeding for a few days.  Do not douche, use tampons, or have sexual intercourse until your health care provider approves.  Return to your normal activities as told by your health care provider. Ask your health care provider what activities are safe for you. This information is not intended to replace advice given to you by your  health care provider. Make sure you discuss any questions you have with your health care provider. Document Released: 05/01/2013 Document Revised: 07/27/2016 Document Reviewed: 07/27/2016 Elsevier Interactive Patient Education  2019 [employer].

## 2018-11-19 NOTE — Progress Notes (Signed)
Error   This encounter was created in error - please disregard. 

## 2018-11-19 NOTE — Progress Notes (Signed)
Patient ID: Stacy Campos, female   DOB: 01/12/1971, 48 y.o.   MRN: [id number] Endometrial Biopsy Procedure Note  Pre-operative Diagnosis: Menorrhagia, endometrial hyperplasia, Abnormal PAP  Post-operative Diagnosis: same  Indications: abnormal uterine bleeding  Procedure Details   Urine pregnancy test was not done, recent pelvic US negative for pregnancy and she is not sexually active.  The risks (including infection, bleeding, pain, and uterine perforation) and benefits of the procedure were explained to the patient and Written informed consent was obtained.  Antibiotic prophylaxis against endocarditis was not indicated.   The patient was placed in the dorsal lithotomy position.  Bimanual exam showed the uterus to be in the neutral position.  A Graves' speculum inserted in the vagina, and the cervix prepped with povidone iodine.  Endocervical curettage with a Kevorkian curette was performed.   A sharp tenaculum was applied to the anterior lip of the cervix for stabilization.  A sterile uterine sound was used to sound the uterus to a depth of 7cm.  A Pipelle endometrial aspirator was used to sample the endometrium.  Sample was sent for pathologic examination.  Condition: Stable  Complications: None  Assess/Plan: Endometrial hyperplasia ASCUS on PAP Tylenol given after procedure for pain. The patient was advised to call for any fever or for prolonged or severe pain or bleeding. She was advised to use OTC acetaminophen as needed for mild to moderate pain. She was advised to avoid vaginal intercourse for 48 hours or until the bleeding has completely stopped.  PAP result discussed with her. She is advised to schedule Gyn clinic appointment for colposcopy and biopsy. She agreed with the plan.  Attending Physician Documentation: I was present for or performed the following: Andrena Mews, MD, MPH

## 2018-11-21 ENCOUNTER — Telehealth: Payer: Self-pay | Admitting: *Deleted

## 2018-11-21 NOTE — Telephone Encounter (Signed)
LM for patient to call back and discuss results and make appt for colpo clinic.  Jazmin Hartsell,CMA

## 2018-11-21 NOTE — Telephone Encounter (Signed)
-----   Message from Kinnie Feil, MD sent at 11/21/2018  9:27 AM EDT ----- Please call to let patient know that her endometrial biopsy is negative for cancer. Schedule gyn clinic appointment as recommended for colposcopy. If bleeding persists, contact PCP for Gyn referral.

## 2018-11-21 NOTE — Telephone Encounter (Signed)
Patient informed and appt made.  Jazmin Hartsell,CMA

## 2018-12-13 ENCOUNTER — Ambulatory Visit: Payer: Medicaid Other

## 2019-01-10 ENCOUNTER — Other Ambulatory Visit: Payer: Self-pay

## 2019-01-10 ENCOUNTER — Ambulatory Visit: Payer: Medicaid Other | Admitting: Family Medicine

## 2019-01-10 DIAGNOSIS — N939 Abnormal uterine and vaginal bleeding, unspecified: Secondary | ICD-10-CM | POA: Diagnosis not present

## 2019-01-10 NOTE — Progress Notes (Signed)
CC/HPI Reports her she has had no menstrual cycles since 2005 but had Mirena IUD present until 2017.  PERTINENT  PMH / PSH: I have reviewed the patient's medications, allergies, past medical and surgical history, smoking status.  Pertinent findings that relate to today's visit / issues include: Mirena removed 2017 Pelvic US: 10/2018 Uterus:Measurements: 9.2 x 4.9 x 4.5 cm = volume: 107 mL. Probable 1.5 cm fibroid seen anteriorly within the uterus. Endometrium  Thickness: 9 mm which is abnormally thickened for postmenopausal patient. No focal abnormality visualized. Right ovary  Measurements: 2.4 x 1.4 x 1.2 cm = volume: 2 mL. Normal appearance/no adnexal mass. Left ovary Measurements: 2.6 x 1.4 x 1.6 cm = volume: 3 mL. Normal appearance/no adnexal mass Cytology: Pap 2009 NILM 2017 NILM 2020 ASCUS neg hi risk HPV Endometrial biopsy; proliferative endometrium, no malignancy, no hyperplasia  Weight: 10/2017 292lbs 11/19/2018 298lbs OBJ: WD WN NAD Obese PSYCH: AxOx4. Good eye contact.. No psychomotor retardation or agitation. Appropriate speech fluency and content. Asks and answers questions appropriately. Mood is congruent. Respiratory: no increased work of breathing  ASSESSMENT: Dysfunctional uterine bleeding, not post menopausal beleeding PLAN:  Review of chart and work up to date. I suspect she has not been in true menopause, but amenorrheic. Initially she had progesterone IUD and when that was removed she had continued amenorrhea  because of exogenous obesity, then when endometrial lining built up to it's current 9 cm, she began to sloggh. I discussed this with her at length. She does not require a colposcopy and it would not add any info to the dx of abnormal uterine. Her endometrial biopsy was adequate and showed no atypia, normal proliferative endometrium. I think her options are: 1. Replace mirena IUD 2. Cycle monthly with oral progesterone x 5 d  I also offered GYN  referral. Ultimately she would like to think abou tit and plans to get back with me via phone in next week or so. Greater than 50% of our 30 minute office visit was spent in counseling and education regarding these issues.
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
## 2019-01-10 NOTE — Patient Instructions (Signed)
Thank you for coming to see me today. It was a pleasure!  The options we discussed today for your bleeding are:  Doing nothing  Having another mirena placed to help with bleeding  Taking a pill for 5 days per month to cause bleeding regularly  Referral to gynecologist for further management  We have proven that your bleeding is not something scary such as cancer. Please discuss and decide. You can then call us (Dr. Nori Riis or Dr. Enid Derry) after you have decided and we can go from there.   If you have any questions or concerns, please do not hesitate to call the office at [phone].  Take Care,   [place] Aymara Sassi, DO

## 2019-01-14 ENCOUNTER — Encounter: Payer: Self-pay | Admitting: Family Medicine

## 2019-01-14 NOTE — Assessment & Plan Note (Signed)
Review of chart and work up to date. I suspect she has not been in true menopause, but amenorrheic. Initially she had progesterone IUD and when that was removed she had continued amenorrhea  because of exogenous obesity, then when endometrial lining built up to it's current 9 cm, she began to sloggh. I discussed this with her at length. She does not require a colposcopy and it would not add any info to the dx of abnormal uterine. Her endometrial biopsy was adequate and showed no atypia, normal proliferative endometrium. I think her options are: 1. Replace mirena IUD 2. Cycle monthly with oral progesterone x 5 d  I also offered GYN referral. Ultimately she would like to think abou tit and plans to get back with me via phone in next week or so.

## 2019-02-04 DIAGNOSIS — L732 Hidradenitis suppurativa: Secondary | ICD-10-CM | POA: Diagnosis not present

## 2019-02-09 DIAGNOSIS — Z1159 Encounter for screening for other viral diseases: Secondary | ICD-10-CM | POA: Diagnosis not present

## 2019-02-11 ENCOUNTER — Telehealth: Payer: Self-pay | Admitting: *Deleted

## 2019-02-11 DIAGNOSIS — N939 Abnormal uterine and vaginal bleeding, unspecified: Secondary | ICD-10-CM

## 2019-02-11 NOTE — Telephone Encounter (Signed)
Pt is interested in a gyn referral.   Will forward to PCP and Dr. Nori Riis.  Christen Bame, CMA

## 2019-02-11 NOTE — Telephone Encounter (Signed)
It looks like from note Dr. Nori Riis wanted to speak to patient about this. I will forward this message to her  Caroline More, DO, PGY-3 [facility] 02/11/2019 1:39 PM

## 2019-02-11 NOTE — Telephone Encounter (Signed)
Please clarify why patient is requesting referral. I cannot put in referral without a diagnosis.   Dalphine Handing, PGY-3 [facility] 02/11/2019 12:18 PM

## 2019-02-11 NOTE — Telephone Encounter (Signed)
Please see last appt with [facility].  Its for uterine bleeding. Christen Bame, CMA

## 2019-02-12 ENCOUNTER — Other Ambulatory Visit: Payer: Self-pay | Admitting: Family Medicine

## 2019-02-12 DIAGNOSIS — N939 Abnormal uterine and vaginal bleeding, unspecified: Secondary | ICD-10-CM

## 2019-02-12 NOTE — Progress Notes (Signed)
Pt has decided she wants referral to GYN

## 2019-03-25 ENCOUNTER — Other Ambulatory Visit: Payer: Self-pay

## 2019-03-25 ENCOUNTER — Ambulatory Visit (INDEPENDENT_AMBULATORY_CARE_PROVIDER_SITE_OTHER): Payer: Medicaid Other | Admitting: Obstetrics and Gynecology

## 2019-03-25 ENCOUNTER — Encounter: Payer: Self-pay | Admitting: Obstetrics and Gynecology

## 2019-03-25 VITALS — BP 164/96 | HR 90 | Ht 67.0 in | Wt 312.0 lb

## 2019-03-25 DIAGNOSIS — N938 Other specified abnormal uterine and vaginal bleeding: Secondary | ICD-10-CM

## 2019-03-25 MED ORDER — MEGESTROL ACETATE 40 MG PO TABS: 40.0000 mg | ORAL_TABLET | Freq: Every day | ORAL | 3 refills | Status: DC
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
## 2019-03-25 NOTE — Progress Notes (Signed)
Pt is up to date on pap and has had recent Endo Bx. Pt has been having irregular bleeding since April. Pt states she had not had a period since 2017 prior. Pt states she has also been in severe pain.

## 2019-04-26 DIAGNOSIS — M940 Chondrocostal junction syndrome [Tietze]: Secondary | ICD-10-CM | POA: Diagnosis present

## 2019-04-26 DIAGNOSIS — G8929 Other chronic pain: Secondary | ICD-10-CM | POA: Diagnosis present

## 2019-04-26 DIAGNOSIS — R0902 Hypoxemia: Secondary | ICD-10-CM | POA: Diagnosis not present

## 2019-04-26 DIAGNOSIS — M419 Scoliosis, unspecified: Secondary | ICD-10-CM | POA: Diagnosis present

## 2019-04-26 DIAGNOSIS — E876 Hypokalemia: Secondary | ICD-10-CM | POA: Diagnosis present

## 2019-04-26 DIAGNOSIS — J9601 Acute respiratory failure with hypoxia: Secondary | ICD-10-CM | POA: Diagnosis present

## 2019-04-26 DIAGNOSIS — Z8249 Family history of ischemic heart disease and other diseases of the circulatory system: Secondary | ICD-10-CM

## 2019-04-26 DIAGNOSIS — J1289 Other viral pneumonia: Secondary | ICD-10-CM | POA: Diagnosis present

## 2019-04-26 DIAGNOSIS — Z713 Dietary counseling and surveillance: Secondary | ICD-10-CM

## 2019-04-26 DIAGNOSIS — Z6841 Body Mass Index (BMI) 40.0 and over, adult: Secondary | ICD-10-CM

## 2019-04-26 DIAGNOSIS — U071 COVID-19: Principal | ICD-10-CM | POA: Diagnosis present

## 2019-04-26 DIAGNOSIS — L732 Hidradenitis suppurativa: Secondary | ICD-10-CM | POA: Diagnosis present

## 2019-04-26 DIAGNOSIS — Z87891 Personal history of nicotine dependence: Secondary | ICD-10-CM

## 2019-04-26 DIAGNOSIS — Z79899 Other long term (current) drug therapy: Secondary | ICD-10-CM

## 2019-04-26 DIAGNOSIS — Z888 Allergy status to other drugs, medicaments and biological substances status: Secondary | ICD-10-CM

## 2019-04-26 DIAGNOSIS — I119 Hypertensive heart disease without heart failure: Secondary | ICD-10-CM | POA: Diagnosis present

## 2019-04-26 DIAGNOSIS — Z7289 Other problems related to lifestyle: Secondary | ICD-10-CM

## 2019-04-27 ENCOUNTER — Emergency Department (HOSPITAL_COMMUNITY): Payer: Medicaid Other

## 2019-04-27 ENCOUNTER — Inpatient Hospital Stay (HOSPITAL_COMMUNITY)
Admission: EM | Admit: 2019-04-27 | Discharge: 2019-05-01 | DRG: 177 | Disposition: A | Discharge status: Home / Self Care | Payer: Medicaid Other | Attending: Internal Medicine | Admitting: Internal Medicine

## 2019-04-27 ENCOUNTER — Encounter (HOSPITAL_COMMUNITY): Payer: Self-pay | Admitting: *Deleted

## 2019-04-27 ENCOUNTER — Other Ambulatory Visit: Payer: Self-pay

## 2019-04-27 DIAGNOSIS — J189 Pneumonia, unspecified organism: Secondary | ICD-10-CM | POA: Diagnosis not present

## 2019-04-27 DIAGNOSIS — R079 Chest pain, unspecified: Secondary | ICD-10-CM | POA: Diagnosis present

## 2019-04-27 DIAGNOSIS — U071 COVID-19: Secondary | ICD-10-CM | POA: Diagnosis present

## 2019-04-27 DIAGNOSIS — I119 Hypertensive heart disease without heart failure: Secondary | ICD-10-CM | POA: Diagnosis not present

## 2019-04-27 DIAGNOSIS — E876 Hypokalemia: Secondary | ICD-10-CM | POA: Diagnosis not present

## 2019-04-27 DIAGNOSIS — Z8249 Family history of ischemic heart disease and other diseases of the circulatory system: Secondary | ICD-10-CM | POA: Diagnosis not present

## 2019-04-27 DIAGNOSIS — Z6841 Body Mass Index (BMI) 40.0 and over, adult: Secondary | ICD-10-CM | POA: Diagnosis not present

## 2019-04-27 DIAGNOSIS — R0902 Hypoxemia: Secondary | ICD-10-CM | POA: Diagnosis not present

## 2019-04-27 DIAGNOSIS — J9691 Respiratory failure, unspecified with hypoxia: Secondary | ICD-10-CM

## 2019-04-27 DIAGNOSIS — M419 Scoliosis, unspecified: Secondary | ICD-10-CM | POA: Diagnosis not present

## 2019-04-27 DIAGNOSIS — R071 Chest pain on breathing: Secondary | ICD-10-CM | POA: Diagnosis not present

## 2019-04-27 DIAGNOSIS — J9601 Acute respiratory failure with hypoxia: Secondary | ICD-10-CM

## 2019-04-27 DIAGNOSIS — J1289 Other viral pneumonia: Secondary | ICD-10-CM

## 2019-04-27 DIAGNOSIS — M940 Chondrocostal junction syndrome [Tietze]: Secondary | ICD-10-CM | POA: Diagnosis not present

## 2019-04-27 DIAGNOSIS — M4120 Other idiopathic scoliosis, site unspecified: Secondary | ICD-10-CM

## 2019-04-27 DIAGNOSIS — I10 Essential (primary) hypertension: Secondary | ICD-10-CM | POA: Diagnosis not present

## 2019-04-27 DIAGNOSIS — G8929 Other chronic pain: Secondary | ICD-10-CM | POA: Diagnosis not present

## 2019-04-27 DIAGNOSIS — Z888 Allergy status to other drugs, medicaments and biological substances status: Secondary | ICD-10-CM | POA: Diagnosis not present

## 2019-04-27 DIAGNOSIS — L732 Hidradenitis suppurativa: Secondary | ICD-10-CM | POA: Diagnosis not present

## 2019-04-27 DIAGNOSIS — Z79899 Other long term (current) drug therapy: Secondary | ICD-10-CM | POA: Diagnosis not present

## 2019-04-27 DIAGNOSIS — Z713 Dietary counseling and surveillance: Secondary | ICD-10-CM | POA: Diagnosis not present

## 2019-04-27 DIAGNOSIS — R0603 Acute respiratory distress: Secondary | ICD-10-CM | POA: Insufficient documentation

## 2019-04-27 DIAGNOSIS — J1282 Pneumonia due to coronavirus disease 2019: Secondary | ICD-10-CM | POA: Diagnosis present

## 2019-04-27 DIAGNOSIS — Z7289 Other problems related to lifestyle: Secondary | ICD-10-CM | POA: Diagnosis not present

## 2019-04-27 DIAGNOSIS — Z87891 Personal history of nicotine dependence: Secondary | ICD-10-CM | POA: Diagnosis not present

## 2019-04-27 HISTORY — DX: Respiratory failure, unspecified with hypoxia: J96.91

## 2019-04-27 LAB — BASIC METABOLIC PANEL
Anion gap: 10 (ref 5–15)
BUN: 7 mg/dL (ref 6–20)
CO2: 24 mmol/L (ref 22–32)
Calcium: 8.4 mg/dL — ABNORMAL LOW (ref 8.9–10.3)
Chloride: 100 mmol/L (ref 98–111)
Creatinine, Ser: 0.83 mg/dL (ref 0.44–1.00)
GFR calc Af Amer: >60 mL/min (ref >60)
GFR calc non Af Amer: >60 mL/min (ref >60)
Glucose, Bld: 115 mg/dL — ABNORMAL HIGH (ref 70–99)
Potassium: 3.4 mmol/L — ABNORMAL LOW (ref 3.5–5.1)
Sodium: 134 mmol/L — ABNORMAL LOW (ref 135–145)

## 2019-04-27 LAB — HEPATIC FUNCTION PANEL
ALT: 21 U/L (ref 0–44)
AST: 34 U/L (ref 15–41)
Albumin: 3.3 g/dL — ABNORMAL LOW (ref 3.5–5.0)
Alkaline Phosphatase: 47 U/L (ref 38–126)
Bilirubin, Direct: <0.1 mg/dL (ref 0.0–0.2)
Total Bilirubin: <0.1 mg/dL — ABNORMAL LOW (ref 0.3–1.2)
Total Protein: 7.7 g/dL (ref 6.5–8.1)

## 2019-04-27 LAB — FERRITIN: Ferritin: 248 ng/mL (ref 11–307)

## 2019-04-27 LAB — CBC
HCT: 37.7 % (ref 36.0–46.0)
HCT: 39.1 % (ref 36.0–46.0)
Hemoglobin: 12.1 g/dL (ref 12.0–15.0)
Hemoglobin: 12.3 g/dL (ref 12.0–15.0)
MCH: 26.6 pg (ref 26.0–34.0)
MCH: 27 pg (ref 26.0–34.0)
MCHC: 31.5 g/dL (ref 30.0–36.0)
MCHC: 32.1 g/dL (ref 30.0–36.0)
MCV: 84.2 fL (ref 80.0–100.0)
MCV: 84.4 fL (ref 80.0–100.0)
Platelets: 208 10*3/uL (ref 150–400)
Platelets: 224 10*3/uL (ref 150–400)
RBC: 4.48 MIL/uL (ref 3.87–5.11)
RBC: 4.63 MIL/uL (ref 3.87–5.11)
RDW: 13.8 % (ref 11.5–15.5)
RDW: 14 % (ref 11.5–15.5)
WBC: 3.8 10*3/uL — ABNORMAL LOW (ref 4.0–10.5)
WBC: 5.1 10*3/uL (ref 4.0–10.5)
nRBC: 0 % (ref 0.0–0.2)
nRBC: 0 % (ref 0.0–0.2)

## 2019-04-27 LAB — TROPONIN I (HIGH SENSITIVITY)
Troponin I (High Sensitivity): 4 ng/L (ref <18)
Troponin I (High Sensitivity): 5 ng/L (ref <18)

## 2019-04-27 LAB — C-REACTIVE PROTEIN: CRP: 9 mg/dL — ABNORMAL HIGH (ref <1.0)

## 2019-04-27 LAB — LACTIC ACID, PLASMA
Lactic Acid, Venous: 0.7 mmol/L (ref 0.5–1.9)
Lactic Acid, Venous: 0.7 mmol/L (ref 0.5–1.9)

## 2019-04-27 LAB — CREATININE, SERUM
Creatinine, Ser: 0.85 mg/dL (ref 0.44–1.00)
GFR calc Af Amer: >60 mL/min (ref >60)
GFR calc non Af Amer: >60 mL/min (ref >60)

## 2019-04-27 LAB — I-STAT BETA HCG BLOOD, ED (MC, WL, AP ONLY): I-stat hCG, quantitative: <5 m[IU]/mL (ref <5)

## 2019-04-27 LAB — ABO/RH: ABO/RH(D): B NEG

## 2019-04-27 LAB — FIBRINOGEN: Fibrinogen: 524 mg/dL — ABNORMAL HIGH (ref 210–475)

## 2019-04-27 LAB — HIV ANTIBODY (ROUTINE TESTING W REFLEX): HIV Screen 4th Generation wRfx: NONREACTIVE

## 2019-04-27 LAB — LACTATE DEHYDROGENASE: LDH: 311 U/L — ABNORMAL HIGH (ref 98–192)

## 2019-04-27 LAB — BRAIN NATRIURETIC PEPTIDE: B Natriuretic Peptide: 14 pg/mL (ref 0.0–100.0)

## 2019-04-27 LAB — PROCALCITONIN: Procalcitonin: <0.1 ng/mL

## 2019-04-27 LAB — SARS CORONAVIRUS 2 BY RT PCR (HOSPITAL ORDER, PERFORMED IN ~~LOC~~ HOSPITAL LAB): SARS Coronavirus 2: POSITIVE — AB

## 2019-04-27 LAB — D-DIMER, QUANTITATIVE: D-Dimer, Quant: 0.8 ug/mL-FEU — ABNORMAL HIGH (ref 0.00–0.50)

## 2019-04-27 IMAGING — DX DG CHEST 2V
2 series · 2 of 2 positions shown · non-contrast
Comparison: [DATE]

CLINICAL DATA: Chest pain

EXAM:
CHEST - 2 VIEW

[chest pa]
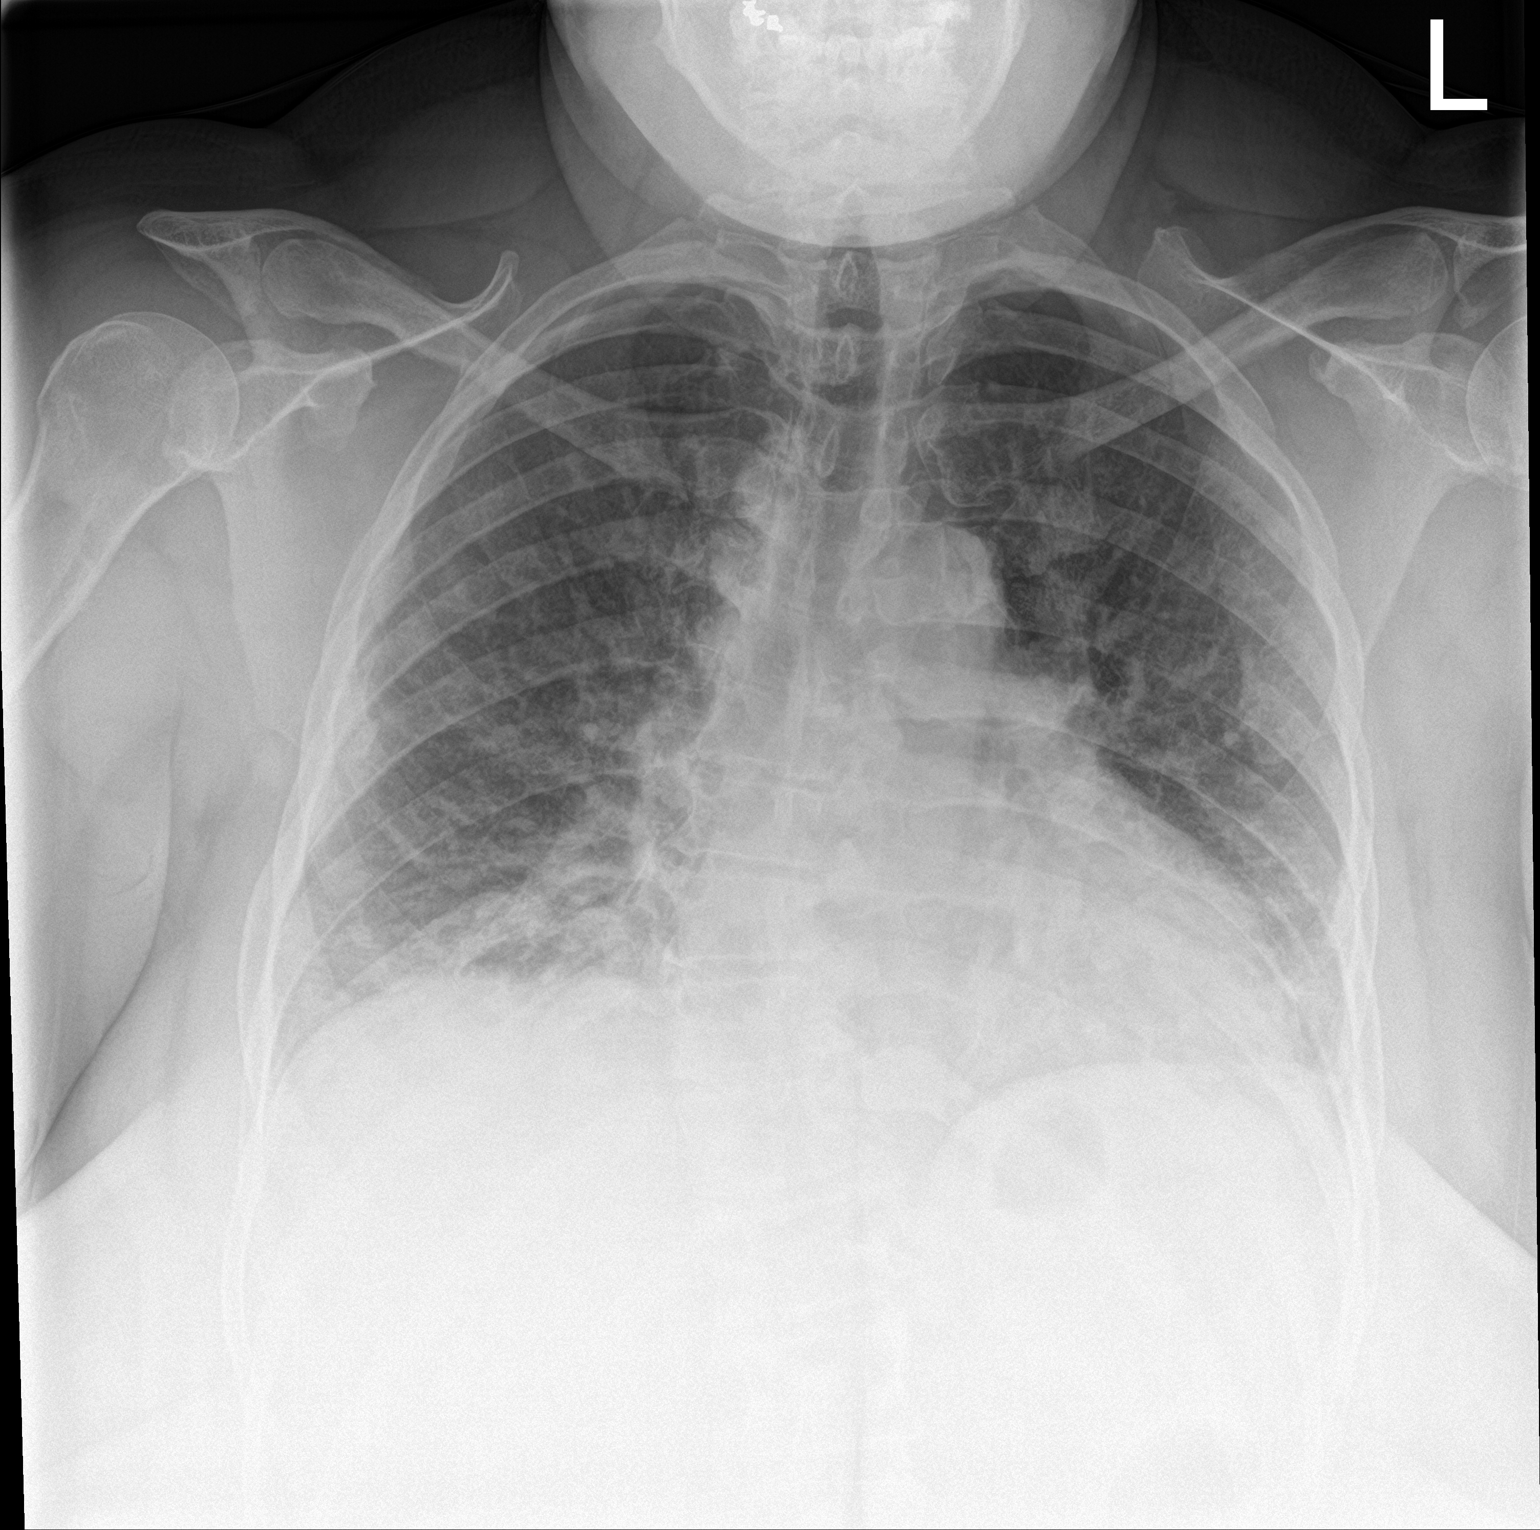

[chest lat]
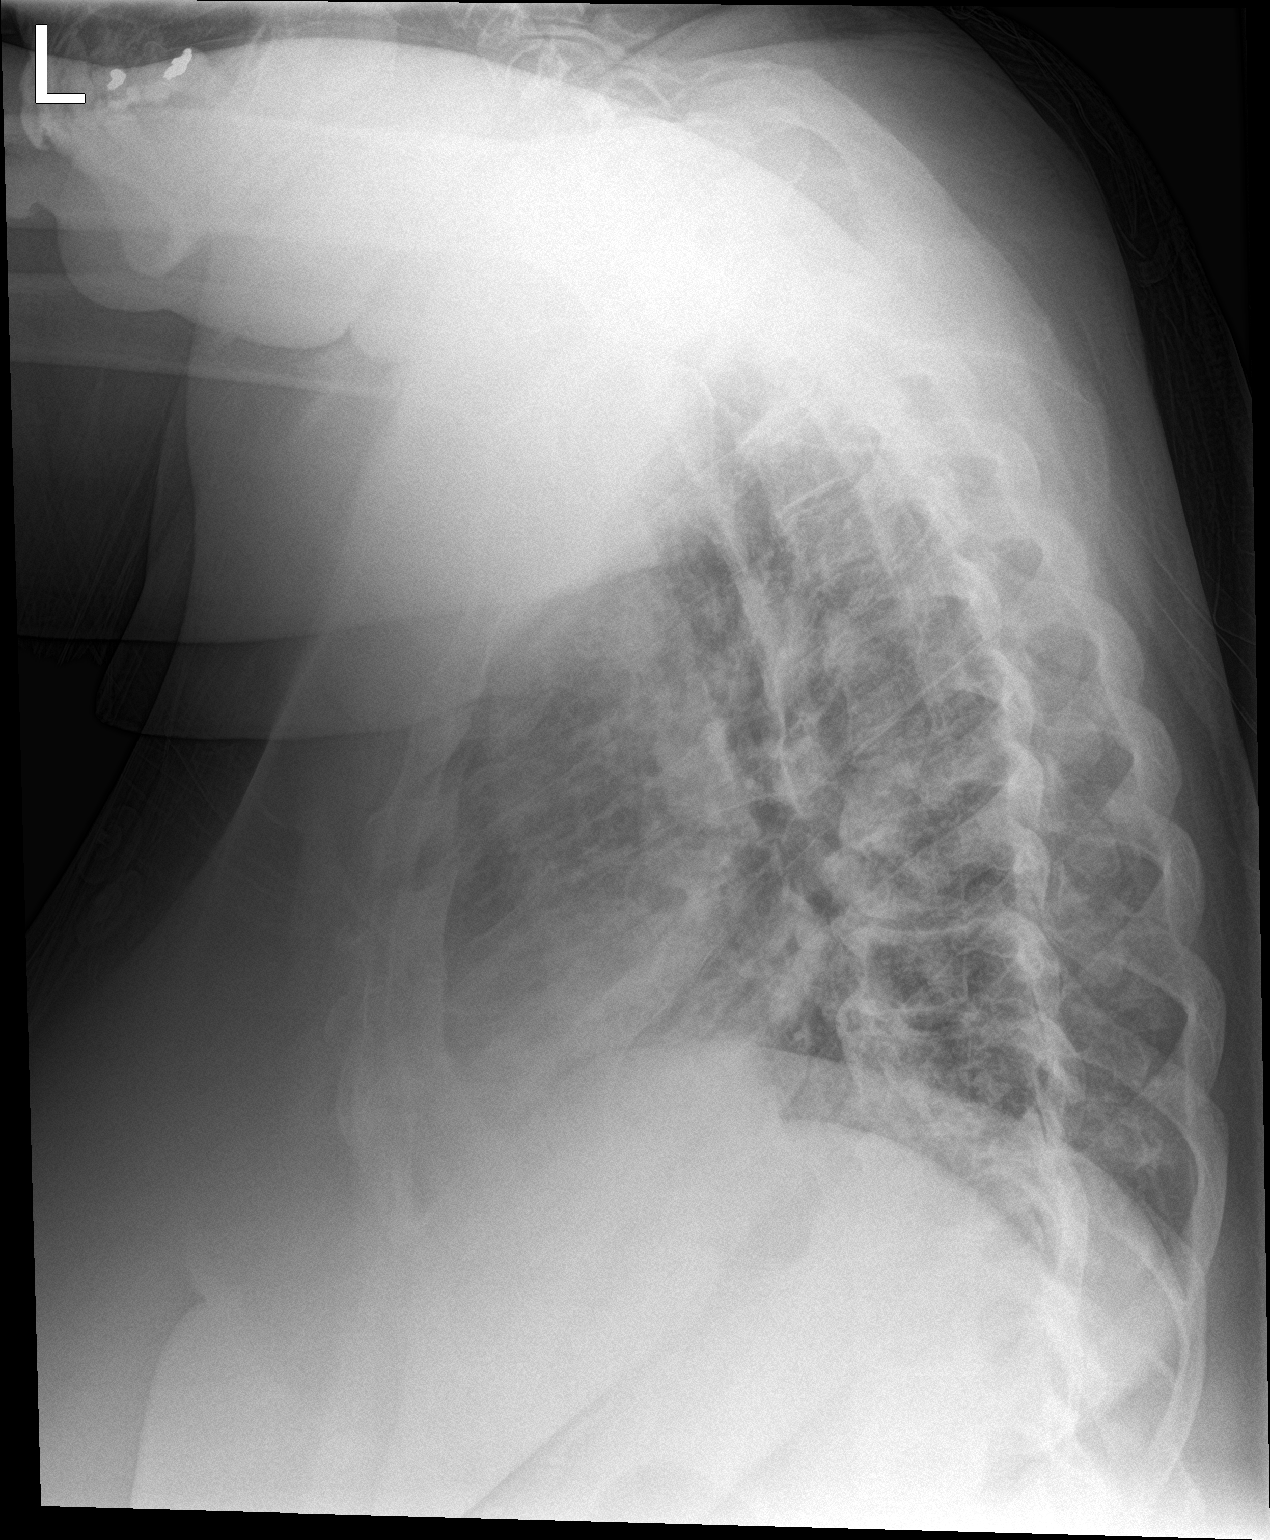

[2 of 2 positions shown; findings below may reference images not displayed]

FINDINGS: No pleural effusion. Cardiomegaly with vascular congestion.
Increased interstitial opacity and patchy airspace disease at the
bases. No pneumothorax.
IMPRESSION: 1. Increased interstitial opacity and patchy airspace disease at the
bases, possible atypical pneumonia/infection
2. Mild cardiomegaly

## 2019-04-27 MED ORDER — AEROCHAMBER PLUS FLO-VU MISC
1.0000 | Freq: Once | Status: AC
  Administered 2019-04-27: 12:00:00 1
  Filled 2019-04-27: qty 1

## 2019-04-27 MED ORDER — IBUPROFEN 800 MG PO TABS
800.0000 mg | ORAL_TABLET | Freq: Once | ORAL | Status: AC
  Administered 2019-04-27: 800 mg via ORAL
  Filled 2019-04-27: qty 1

## 2019-04-27 MED ORDER — ONDANSETRON HCL 4 MG/2ML IJ SOLN: 4.0000 mg | Freq: Four times a day (QID) | INTRAMUSCULAR | Status: DC | PRN

## 2019-04-27 MED ORDER — ONDANSETRON HCL 4 MG PO TABS: 4.0000 mg | ORAL_TABLET | Freq: Four times a day (QID) | ORAL | Status: DC | PRN

## 2019-04-27 MED ORDER — ALBUTEROL SULFATE HFA 108 (90 BASE) MCG/ACT IN AERS
6.0000 | INHALATION_SPRAY | Freq: Once | RESPIRATORY_TRACT | Status: AC
  Administered 2019-04-27: 6 via RESPIRATORY_TRACT
  Filled 2019-04-27: qty 6.7

## 2019-04-27 MED ORDER — AZITHROMYCIN 250 MG PO TABS
500.0000 mg | ORAL_TABLET | Freq: Once | ORAL | Status: AC
  Administered 2019-04-27: 500 mg via ORAL
  Filled 2019-04-27: qty 2

## 2019-04-27 MED ORDER — SODIUM CHLORIDE 0.9 % IV SOLN
1.0000 g | Freq: Once | INTRAVENOUS | Status: AC
  Administered 2019-04-27: 09:00:00 1 g via INTRAVENOUS
  Filled 2019-04-27: qty 10

## 2019-04-27 MED ORDER — SODIUM CHLORIDE 0.9% FLUSH: 3.0000 mL | Freq: Once | INTRAVENOUS | Status: DC

## 2019-04-27 MED ORDER — CHLORTHALIDONE 25 MG PO TABS
25.0000 mg | ORAL_TABLET | Freq: Every day | ORAL | Status: DC
  Administered 2019-04-27: 25 mg via ORAL
  Filled 2019-04-27 (×7): qty 1

## 2019-04-27 MED ORDER — MAGNESIUM SULFATE 2 GM/50ML IV SOLN
2.0000 g | INTRAVENOUS | Status: AC
  Administered 2019-04-27: 09:00:00 2 g via INTRAVENOUS
  Filled 2019-04-27: qty 50

## 2019-04-27 MED ORDER — VITAMIN C 500 MG PO TABS
500.0000 mg | ORAL_TABLET | Freq: Every day | ORAL | Status: DC
  Administered 2019-04-27 – 2019-05-01 (×5): 500 mg via ORAL
  Filled 2019-04-27 (×5): qty 1

## 2019-04-27 MED ORDER — ENOXAPARIN SODIUM 40 MG/0.4ML ~~LOC~~ SOLN
40.0000 mg | SUBCUTANEOUS | Status: DC
  Administered 2019-04-27 – 2019-04-28 (×2): 40 mg via SUBCUTANEOUS
  Filled 2019-04-27: qty 0.4

## 2019-04-27 MED ORDER — SODIUM CHLORIDE 0.9 % IV SOLN
200.0000 mg | Freq: Once | INTRAVENOUS | Status: AC
  Administered 2019-04-28: 01:00:00 200 mg via INTRAVENOUS
  Filled 2019-04-27: qty 40

## 2019-04-27 MED ORDER — ZINC SULFATE 220 (50 ZN) MG PO CAPS
220.0000 mg | ORAL_CAPSULE | Freq: Every day | ORAL | Status: DC
  Administered 2019-04-27 – 2019-05-01 (×5): 220 mg via ORAL
  Filled 2019-04-27 (×6): qty 1

## 2019-04-27 MED ORDER — ACETAMINOPHEN 325 MG PO TABS
650.0000 mg | ORAL_TABLET | Freq: Once | ORAL | Status: DC
  Filled 2019-04-27: qty 2

## 2019-04-27 MED ORDER — ACETAMINOPHEN 160 MG/5ML PO SOLN
650.0000 mg | Freq: Once | ORAL | Status: AC
  Administered 2019-04-27: 01:00:00 650 mg via ORAL

## 2019-04-27 MED ORDER — METOPROLOL SUCCINATE ER 25 MG PO TB24
25.0000 mg | ORAL_TABLET | Freq: Every day | ORAL | Status: DC
  Administered 2019-04-27 – 2019-05-01 (×5): 25 mg via ORAL
  Filled 2019-04-27 (×6): qty 1

## 2019-04-27 MED ORDER — MINOCYCLINE HCL 100 MG PO CAPS
100.0000 mg | ORAL_CAPSULE | Freq: Two times a day (BID) | ORAL | Status: DC
  Administered 2019-04-28 – 2019-05-01 (×7): 100 mg via ORAL
  Filled 2019-04-27 (×9): qty 1

## 2019-04-27 MED ORDER — ACETAMINOPHEN 325 MG PO TABS
650.0000 mg | ORAL_TABLET | Freq: Four times a day (QID) | ORAL | Status: DC | PRN
  Administered 2019-04-28 – 2019-04-29 (×4): 650 mg via ORAL
  Filled 2019-04-27 (×4): qty 2

## 2019-04-27 MED ORDER — AEROCHAMBER PLUS FLO-VU LARGE MISC
Status: AC
  Administered 2019-04-27: 1
  Filled 2019-04-27: qty 1

## 2019-04-27 MED ORDER — SODIUM CHLORIDE 0.9 % IV SOLN
100.0000 mg | INTRAVENOUS | Status: AC
  Administered 2019-04-28 – 2019-05-01 (×4): 100 mg via INTRAVENOUS
  Filled 2019-04-27 (×4): qty 20

## 2019-04-27 MED ORDER — METHYLPREDNISOLONE SODIUM SUCC 125 MG IJ SOLR
125.0000 mg | Freq: Once | INTRAMUSCULAR | Status: AC
  Administered 2019-04-27: 125 mg via INTRAVENOUS
  Filled 2019-04-27: qty 2

## 2019-04-27 MED ORDER — DEXAMETHASONE 6 MG PO TABS
6.0000 mg | ORAL_TABLET | ORAL | Status: DC
  Administered 2019-04-27 – 2019-05-01 (×5): 6 mg via ORAL
  Filled 2019-04-27 (×4): qty 1
  Filled 2019-04-27: qty 2

## 2019-04-27 NOTE — H&P (Addendum)
History and Physical    Stacy Campos [id number] DOB: Mar 27, 1971 DOA: 04/27/2019  PCP: Caroline More, DO  Patient coming from: Home I have personally briefly reviewed patient's old medical records in [place]  Chief Complaint: Chest pain and headache since 2 days.  HPI: Stacy Campos is a 48 y.o. female with medical history significant of hypertension, morbid obesity, hidradenitis operative well, scoliosis, spinal stenosis presents to emergency department due to chest pain and headache since 2 days.  Patient reports that her symptoms began 2 days ago when she went to [place] with her friends for jet skiing.  Reports that her chest pain is severe, nonradiating, central, no aggravating or relieving factors, pressure-like, nonradiating.  Reports cough, body aches, headache, generalized weakness, fatigue and chills.  Her shortness of breath is mostly exertional.  She denies leg swelling, orthopnea, PND or history of CHF or asthma.  Denies blurry vision, lightheadedness, dizziness, wheezing, nausea, vomiting, abdominal pain, urinary or sleep changes.  She lives with her son and sister at home and denies smoking, alcohol, illicit drug use.  ED Course: Patient was found to have fever of 102.4, tachycardia, tachypnea, hypoxic and elevated blood pressure in 160s over 100.  She placed on 2 L of oxygen via nasal cannula.  COVID-19 came back positive.  Troponin x2-.  Lactic acid: Normal.  proBNP: Normal.  CBC: Shows no leukocytosis.  CMP: Hypokalemia.  Chest x-ray shows: Increased interstitial opacity and patchy airspace disease at the bases, possible atypical pneumonia/infection.  Patient received breathing treatment, IV Rocephin and azithromycin and a loading dose of steroid in ED.  Review of Systems: As per HPI otherwise negative.    Past Medical History:  Diagnosis Date   Arthritis    "knees, back" (02/03/2017)   Chronic lower back pain    Daily headache    "related to my BP"  (02/03/2017)   Elevated blood pressure reading without diagnosis of hypertension    Hidradenitis    "chronic; been ongoing for > 1 yr" (02/03/2017)   Hypertension    Scoliosis     Past Surgical History:  Procedure Laterality Date   DILATION AND CURETTAGE OF UTERUS     HYDRADENITIS EXCISION Left 06/07/2017   Procedure: EXCISION HIDRADENITIS OF LEFT BREAST;  Surgeon: Erroll Luna, MD;  Location: [facility];  Service: General;  Laterality: Left;   KNEE ARTHROSCOPY Bilateral 1992     reports that she quit smoking about 30 years ago. Her smoking use included cigarettes. She quit after 2.00 years of use. She has never used smokeless tobacco. She reports current alcohol use. She reports that she does not use drugs.  Allergies  Allergen Reactions   Valsartan Swelling   Dimetapp [name] Hives    Family History  Problem Relation Age of Onset   Arthritis Mother    Rheum arthritis Mother    Lung disease Mother        interstitial 2/2 RA, rheumatic heart disease   Stroke Father    Hypertension Father    Diabetes Father    Heart disease Father    Hyperlipidemia Father    Stroke Maternal Grandmother    Diabetes Maternal Grandmother    Heart disease Maternal Grandfather    Diabetes Paternal Grandmother    Diabetes Paternal Grandfather    Heart disease Paternal Grandfather    Cancer Sister        UTERINE- SPREAD TO LUNGS/SPLEEN/LIVER   Breast cancer Paternal Aunt     Prior  to Admission medications   Medication Sig Start Date End Date Taking? Authorizing Provider  acetaminophen (TYLENOL) 325 MG tablet Take 650 mg by mouth every 6 (six) hours as needed for mild pain or fever.   Yes [provider]  chlorthalidone (HYGROTON) 25 MG tablet Take 1 tablet (25 mg total) by mouth daily. 05/17/18 04/27/19 Yes Lorretta Harp, MD  ELDERBERRY PO Take 2 tablets by mouth daily. gummies   Yes [provider]  megestrol  (MEGACE) 40 MG tablet Take 1 tablet (40 mg total) by mouth daily. 03/25/19  Yes Constant, Peggy, MD  metoprolol succinate (TOPROL-XL) 25 MG 24 hr tablet TAKE 1 TABLET BY MOUTH EVERY EVENING WITH, OR IMMEDIATELY FOLLOWING A MEAL Patient taking differently: Take 25 mg by mouth 3 (three) times daily with meals.  03/19/18  Yes Tammi Klippel, Sherin, DO  minocycline (MINOCIN) 100 MG capsule Take 100 mg by mouth 2 (two) times daily. 02/04/19  Yes [provider]  Multiple Vitamin (MULTIVITAMIN WITH MINERALS) TABS tablet Take 1 tablet by mouth daily.   Yes [provider]  acetaminophen (TYLENOL) 500 MG tablet Take 1 tablet (500 mg total) by mouth every 6 (six) hours as needed for mild pain or headache. Patient not taking: Reported on 11/19/2018 11/08/18   Doristine Mango L, DO  doxycycline (VIBRA-TABS) 100 MG tablet Take 1 tablet (100 mg total) by mouth 2 (two) times daily. Patient not taking: Reported on 03/25/2019 11/01/18   Everrett Coombe, MD  fluticasone [facility]) 50 MCG/ACT nasal spray Place 2 sprays into both nostrils daily. Patient not taking: Reported on 04/27/2019 10/29/17   MayoPete Pelt, MD  HYDROcodone-acetaminophen (HYCET) 7.5-325 mg/15 ml solution Take 10 mL every 4 hours as needed for pain. Patient not taking: Reported on 05/01/2018 11/15/17   Molpus, Jenny Reichmann, MD  ibuprofen (ADVIL,MOTRIN) 400 MG tablet Take 1 tablet (400 mg total) every 6 (six) hours as needed by mouth. Patient not taking: Reported on 04/27/2019 06/10/17   Palumbo, April, MD  naproxen (NAPROSYN) 500 MG tablet Take 1 tablet (500 mg total) by mouth 2 (two) times daily with a meal. Patient not taking: Reported on 03/25/2019 09/26/18   Bufford Lope, DO    Physical Exam: Vitals:   04/27/19 1015 04/27/19 1030 04/27/19 1130 04/27/19 1145  BP: (!) 142/88 (!) 150/95 139/79 139/81  Pulse: 92 91 88 90  Resp: (!) 25 (!) 33 (!) 22 (!) 22  Temp:      TempSrc:      SpO2: 96% 99% 97% 97%  Weight:      Height:         Constitutional: NAD, calm, comfortable Vitals:   04/27/19 1015 04/27/19 1030 04/27/19 1130 04/27/19 1145  BP: (!) 142/88 (!) 150/95 139/79 139/81  Pulse: 92 91 88 90  Resp: (!) 25 (!) 33 (!) 22 (!) 22  Temp:      TempSrc:      SpO2: 96% 99% 97% 97%  Weight:      Height:       Constitutional: Alert and oriented x3, communicating well, not in acute distress.  On 2 L of oxygen via nasal cannula.   Eyes: PERRL, lids and conjunctivae normal ENMT: Mucous membranes are moist. Posterior pharynx clear of any exudate or lesions.Normal dentition.  Neck: normal, supple, no masses, no thyromegaly Respiratory: clear to auscultation bilaterally, no wheezing, no crackles. Normal respiratory effort. No accessory muscle use.  Cardiovascular: Regular rate and rhythm, no murmurs / rubs / gallops. No  extremity edema. 2+ pedal pulses. No carotid bruits.  Chest wall tenderness positive. Abdomen: no tenderness, no masses palpated. No hepatosplenomegaly. Bowel sounds positive.  Musculoskeletal: no clubbing / cyanosis. No joint deformity upper and lower extremities. Good ROM, no contractures. Normal muscle tone.  Skin: no rashes, lesions, ulcers. No induration Neurologic: CN 2-12 grossly intact. Sensation intact, DTR normal. Strength 5/5 in all 4.  Psychiatric: Normal judgment and insight. Alert and oriented x 3. Normal mood.    Labs on Admission: I have personally reviewed following labs and imaging studies  CBC: Recent Labs  Lab 04/27/19 0028  WBC 5.1  HGB 12.3  HCT 39.1  MCV 84.4  PLT [id number]   Basic Metabolic Panel: Recent Labs  Lab 04/27/19 0028  NA 134*  K 3.4*  CL 100  CO2 24  GLUCOSE 115*  BUN 7  CREATININE 0.83  CALCIUM 8.4*   GFR: Estimated Creatinine Clearance: 118.3 mL/min (by C-G formula based on SCr of 0.83 mg/dL). Liver Function Tests: No results for input(s): AST, ALT, ALKPHOS, BILITOT, PROT, ALBUMIN in the last 168 hours. No results for input(s): LIPASE, AMYLASE in the  last 168 hours. No results for input(s): AMMONIA in the last 168 hours. Coagulation Profile: No results for input(s): INR, PROTIME in the last 168 hours. Cardiac Enzymes: No results for input(s): CKTOTAL, CKMB, CKMBINDEX, TROPONINI in the last 168 hours. BNP (last 3 results) No results for input(s): PROBNP in the last 8760 hours. HbA1C: No results for input(s): HGBA1C in the last 72 hours. CBG: No results for input(s): GLUCAP in the last 168 hours. Lipid Profile: No results for input(s): CHOL, HDL, LDLCALC, TRIG, CHOLHDL, LDLDIRECT in the last 72 hours. Thyroid Function Tests: No results for input(s): TSH, T4TOTAL, FREET4, T3FREE, THYROIDAB in the last 72 hours. Anemia Panel: No results for input(s): VITAMINB12, FOLATE, FERRITIN, TIBC, IRON, RETICCTPCT in the last 72 hours. Urine analysis:    Component Value Date/Time   COLORURINE YELLOW 05/27/2017 2156   APPEARANCEUR CLOUDY (A) 05/27/2017 2156   LABSPEC >1.030 (H) 05/27/2017 2156   PHURINE 6.0 05/27/2017 2156   GLUCOSEU NEGATIVE 05/27/2017 2156   HGBUR LARGE (A) 05/27/2017 2156   BILIRUBINUR NEGATIVE 05/27/2017 2156   BILIRUBINUR NEG 12/14/2015 1200   KETONESUR NEGATIVE 05/27/2017 2156   PROTEINUR 100 (A) 05/27/2017 2156   UROBILINOGEN 0.2 12/14/2015 1200   UROBILINOGEN 0.2 06/24/2009 1202   NITRITE POSITIVE (A) 05/27/2017 2156   LEUKOCYTESUR MODERATE (A) 05/27/2017 2156    Radiological Exams on Admission: Dg Chest 2 View  Result Date: 04/27/2019 CLINICAL DATA:  Chest pain EXAM: CHEST - 2 VIEW COMPARISON:  10/12/2011 FINDINGS: No pleural effusion. Cardiomegaly with vascular congestion. Increased interstitial opacity and patchy airspace disease at the bases. No pneumothorax. IMPRESSION: 1. Increased interstitial opacity and patchy airspace disease at the bases, possible atypical pneumonia/infection 2. Mild cardiomegaly Electronically Signed   By: Donavan Foil M.D.   On: 04/27/2019 01:16    EKG: Sinus tachycardia, no acute  ST-T wave changes noted.  Assessment/Plan Principal Problem:   Respiratory failure with hypoxia (HCC) Active Problems:   Essential hypertension   Hydradenitis   Chest pain   Morbid obesity ([place])   Pneumonia due to COVID-19 virus   Scoliosis     Acute hypoxic respiratory Failure: -Secondary to COVID-19 infection.  Patient presented with chest pain, cough, body ache, shortness of breath. -Chest x-ray shows increased interstitial opacity and patchy airspace disease at the bases, possible atypical pneumonia/infection. -Patient received albuterol breathing treatment Rocephin,  azithromycin and loading dose of steroids in ED. -We will admit patient at [place] d-dimer, ferritin, procalcitonin, LDH, CRP -Start on Decadron 6 mg p.o. daily for 10 days and consulted pharmacy for Remdisvir dose -On airborne and contact isolation. -Currently she is on 2 L of oxygen via nasal cannula.  On continuous pulse ox.  We will try to wean her off from oxygen gradually.  Hypertension: Elevated -We will continue home dose of metoprolol and chlorthalidone. -Monitor blood pressure closely.  Chest pain: Likely secondary to viral costochondritis -Chest wall tenderness positive -Troponin negative, EKG: No acute changes.  Hidradenitis: Stable -We will continue home dose of minocycline.  Morbid obesity: With BMI of 46. -Counseled regarding dietary modification, exercise and weight loss.  DVT prophylaxis: Lovenox, SCD/Ted  code Status: Full code Family Communication:None present at bedside.  Plan of care discussed with patient in length and he verbalized understanding and agreed with it. Disposition Plan: TBD Consults called: None Admission status: Inpatient at [place] MD [facility] Pager [phone]  If 7PM-7AM, please contact night-coverage [email or web address] Password [email or web address]  04/27/2019, 1:37 PM

## 2019-04-27 NOTE — ED Notes (Signed)
Microbiology called this Rn to inform me that pt is covid positive.

## 2019-04-27 NOTE — ED Notes (Signed)
ED TO INPATIENT HANDOFF REPORT  ED Nurse Name and Phone #:  Fey Coghill [phone]  S Name/Age/Gender Stacy Campos 48 y.o. female Room/Bed: 025C/025C  Code Status   Code Status: Full Code  Home/SNF/Other Home Patient oriented to: self, place, time and situation Is this baseline? Yes   Triage Complete: Triage complete  Chief Complaint cp   Triage Note The pt is c/o chest pain for 3 days non-productive cough she has also had   Sob nausea and vomiting and dizziness   lmp none   Allergies Allergies  Allergen Reactions  . Valsartan Swelling  . Dimetapp [name] Hives    Level of Care/Admitting Diagnosis ED Disposition    ED Disposition Condition Comment   Admit  Hospital Area: [facility] [[id number]]  Level of Care: Telemetry [5]  Covid Evaluation: Confirmed COVID Positive  Diagnosis: Pneumonia due to COVID-19 virus [[id number]]  Admitting Physician: Mckinley Jewel [[id number]]  Attending Physician: Mckinley Jewel [phone]  Estimated length of stay: past midnight tomorrow  Certification:: I certify this patient will need inpatient services for at least 2 midnights  PT Class (Do Not Modify): Inpatient [101]  PT Acc Code (Do Not Modify): Private [1]       B Medical/Surgery History Past Medical History:  Diagnosis Date  . Arthritis    "knees, back" (02/03/2017)  . Chronic lower back pain   . Daily headache    "related to my BP" (02/03/2017)  . Elevated blood pressure reading without diagnosis of hypertension   . Hidradenitis    "chronic; been ongoing for > 1 yr" (02/03/2017)  . Hypertension   . Scoliosis    Past Surgical History:  Procedure Laterality Date  . DILATION AND CURETTAGE OF UTERUS    . HYDRADENITIS EXCISION Left 06/07/2017   Procedure: EXCISION HIDRADENITIS OF LEFT BREAST;  Surgeon: Erroll Luna, MD;  Location: [facility];  Service: General;  Laterality: Left;  . KNEE ARTHROSCOPY Bilateral 1992      A IV Location/Drains/Wounds Patient Lines/Drains/Airways Status   Active Line/Drains/Airways    Name:   Placement date:   Placement time:   Site:   Days:   Peripheral IV 04/27/19 Right Antecubital   04/27/19    0913    Antecubital   less than 1   Incision (Closed) 06/07/17 Breast Left   06/07/17    1324     689          Intake/Output Last 24 hours  Intake/Output Summary (Last 24 hours) at 04/27/2019 1718 Last data filed at 04/27/2019 1016 Gross per 24 hour  Intake 150 ml  Output -  Net 150 ml    Labs/Imaging Results for orders placed or performed during the hospital encounter of 04/27/19 (from the past 48 hour(s))  Basic metabolic panel     Status: Abnormal   Collection Time: 04/27/19 12:28 AM  Result Value Ref Range   Sodium 134 (L) 135 - 145 mmol/L   Potassium 3.4 (L) 3.5 - 5.1 mmol/L   Chloride 100 98 - 111 mmol/L   CO2 24 22 - 32 mmol/L   Glucose, Bld 115 (H) 70 - 99 mg/dL   BUN 7 6 - 20 mg/dL   Creatinine, Ser 0.83 0.44 - 1.00 mg/dL   Calcium 8.4 (L) 8.9 - 10.3 mg/dL   GFR calc non Af Amer >60 >60 mL/min   GFR calc Af Amer >60 >60 mL/min   Anion gap 10 5 - 15  Comment: Performed at [facility], [place] [address], [place], [place] [address]  CBC     Status: None   Collection Time: 04/27/19 12:28 AM  Result Value Ref Range   WBC 5.1 4.0 - 10.5 K/uL   RBC 4.63 3.87 - 5.11 MIL/uL   Hemoglobin 12.3 12.0 - 15.0 g/dL   HCT 39.1 36.0 - 46.0 %   MCV 84.4 80.0 - 100.0 fL   MCH 26.6 26.0 - 34.0 pg   MCHC 31.5 30.0 - 36.0 g/dL   RDW 13.8 11.5 - 15.5 %   Platelets 224 150 - 400 K/uL   nRBC 0.0 0.0 - 0.2 %    Comment: Performed at [facility], [place] [address] [place], [place] [address]  Troponin I (High Sensitivity)     Status: None   Collection Time: 04/27/19 12:28 AM  Result Value Ref Range   Troponin I (High Sensitivity) 4 <18 ng/L    Comment: (NOTE) Elevated high sensitivity troponin I (hsTnI) values and significant  changes across serial  measurements may suggest ACS but many other  chronic and acute conditions are known to elevate hsTnI results.  Refer to the Links section for chest pain algorithms and additional  guidance. Performed at [facility], [place] [address], [place], [place] [address]   Lactic acid, plasma     Status: None   Collection Time: 04/27/19 12:31 AM  Result Value Ref Range   Lactic Acid, Venous 0.7 0.5 - 1.9 mmol/L    Comment: Performed at [facility] [address], [place], [place] [address]  I-Stat beta hCG blood, ED     Status: None   Collection Time: 04/27/19 12:41 AM  Result Value Ref Range   I-stat hCG, quantitative <5.0 <5 mIU/mL   Comment 3            Comment:   GEST. AGE      CONC.  (mIU/mL)   <=1 WEEK        5 - 50     2 WEEKS       50 - 500     3 WEEKS       100 - 10,000     4 WEEKS     1,000 - 30,000        FEMALE AND NON-PREGNANT FEMALE:     LESS THAN 5 mIU/mL   Lactic acid, plasma     Status: None   Collection Time: 04/27/19  2:32 AM  Result Value Ref Range   Lactic Acid, Venous 0.7 0.5 - 1.9 mmol/L    Comment: Performed at [facility], [place] [address], [place], [place] [address]  Troponin I (High Sensitivity)     Status: None   Collection Time: 04/27/19  2:32 AM  Result Value Ref Range   Troponin I (High Sensitivity) 5 <18 ng/L    Comment: (NOTE) Elevated high sensitivity troponin I (hsTnI) values and significant  changes across serial measurements may suggest ACS but many other  chronic and acute conditions are known to elevate hsTnI results.  Refer to the "Links" section for chest pain algorithms and additional  guidance. Performed at [facility], [place] [address], [place],  [address]   SARS Coronavirus 2 [facility] order, Performed in [facility] hospital lab) Nasopharyngeal Nasopharyngeal Swab     Status: Abnormal   Collection Time: 04/27/19  7:42 AM   Specimen: Nasopharyngeal Swab  Result Value Ref Range  SARS Coronavirus 2 POSITIVE  (A) NEGATIVE    Comment: RESULT CALLED TO, READ BACK BY AND VERIFIED WITH: Bethena Midget, [place] ED RN AT 1030 ON 04/27/19 BY C. JESSUP, MT. (NOTE) If result is NEGATIVE SARS-CoV-2 target nucleic acids are NOT DETECTED. The SARS-CoV-2 RNA is generally detectable in upper and lower  respiratory specimens during the acute phase of infection. The lowest  concentration of SARS-CoV-2 viral copies this assay can detect is 250  copies / mL. A negative result does not preclude SARS-CoV-2 infection  and should not be used as the sole basis for treatment or other  patient management decisions.  A negative result may occur with  improper specimen collection / handling, submission of specimen other  than nasopharyngeal swab, presence of viral mutation(s) within the  areas targeted by this assay, and inadequate number of viral copies  (<250 copies / mL). A negative result must be combined with clinical  observations, patient history, and epidemiological information. If result is POSITIVE SARS-CoV-2 target nucleic a cids are DETECTED. The SARS-CoV-2 RNA is generally detectable in upper and lower  respiratory specimens during the acute phase of infection.  Positive  results are indicative of active infection with SARS-CoV-2.  Clinical  correlation with patient history and other diagnostic information is  necessary to determine patient infection status.  Positive results do  not rule out bacterial infection or co-infection with other viruses. If result is PRESUMPTIVE POSTIVE SARS-CoV-2 nucleic acids MAY BE PRESENT.   A presumptive positive result was obtained on the submitted specimen  and confirmed on repeat testing.  While 2019 novel coronavirus  (SARS-CoV-2) nucleic acids may be present in the submitted sample  additional confirmatory testing may be necessary for epidemiological  and / or clinical management purposes  to differentiate between  SARS-CoV-2 and other Sarbecovirus currently known to infect  humans.  If clinically indicated additional testing with an alternate test  met hodology [phone]) is advised. The SARS-CoV-2 RNA is generally  detectable in upper and lower respiratory specimens during the acute  phase of infection. The expected result is Negative. Fact Sheet for Patients:  StrictlyIdeas.no Fact Sheet for Healthcare Providers: BankingDealers.co.za This test is not yet approved or cleared by the [place] FDA and has been authorized for detection and/or diagnosis of SARS-CoV-2 by FDA under an Emergency Use Authorization (EUA).  This EUA will remain in effect (meaning this test can be used) for the duration of the COVID-19 declaration under Section 564(b)(1) of the Act, 21 U.S.C. section 360bbb-3(b)(1), unless the authorization is terminated or revoked sooner. Performed at [facility], [place] [address], [place], [place] [address]   Brain natriuretic peptide     Status: None   Collection Time: 04/27/19  8:41 AM  Result Value Ref Range   B Natriuretic Peptide 14.0 0.0 - 100.0 pg/mL    Comment: Performed at [facility] [address], [place], [place] [address]  HIV Antibody (routine testing w rflx)     Status: None   Collection Time: 04/27/19 10:13 AM  Result Value Ref Range   HIV Screen 4th Generation wRfx NON REACTIVE NON REACTIVE    Comment: Performed at [facility] [address], [place] [address]  CBC     Status: Abnormal   Collection Time: 04/27/19 10:13 AM  Result Value Ref Range   WBC 3.8 (L) 4.0 - 10.5 K/uL   RBC 4.48 3.87 - 5.11 MIL/uL   Hemoglobin 12.1 12.0 -  15.0 g/dL   HCT 37.7 36.0 - 46.0 %   MCV 84.2 80.0 - 100.0 fL   MCH 27.0 26.0 - 34.0 pg   MCHC 32.1 30.0 - 36.0 g/dL   RDW 14.0 11.5 - 15.5 %   Platelets 208 150 - 400 K/uL   nRBC 0.0 0.0 - 0.2 %    Comment: Performed at [facility], [place] [address], [place], [place] [address]  Creatinine, serum     Status:  None   Collection Time: 04/27/19 10:13 AM  Result Value Ref Range   Creatinine, Ser 0.85 0.44 - 1.00 mg/dL   GFR calc non Af Amer >60 >60 mL/min   GFR calc Af Amer >60 >60 mL/min    Comment: Performed at [facility] [address], [place], [place] [address]  C-reactive protein     Status: Abnormal   Collection Time: 04/27/19 10:13 AM  Result Value Ref Range   CRP 9.0 (H) <1.0 mg/dL    Comment: Performed at [facility] [address], [place], [place] [address]  Ferritin     Status: None   Collection Time: 04/27/19 10:13 AM  Result Value Ref Range   Ferritin 248 11 - 307 ng/mL    Comment: Performed at [facility], [place] [address], [place], [place] [address]  Fibrinogen     Status: Abnormal   Collection Time: 04/27/19 10:13 AM  Result Value Ref Range   Fibrinogen 524 (H) 210 - 475 mg/dL    Comment: Performed at [facility] [address] [place], [place] [address]  Lactate dehydrogenase     Status: Abnormal   Collection Time: 04/27/19 10:13 AM  Result Value Ref Range   LDH 311 (H) 98 - 192 U/L    Comment: Performed at [facility], [place] [address], [place], [place] [address]  Procalcitonin     Status: None   Collection Time: 04/27/19 10:13 AM  Result Value Ref Range   Procalcitonin <0.10 ng/mL    Comment:        Interpretation: PCT (Procalcitonin) <= 0.5 ng/mL: Systemic infection (sepsis) is not likely. Local bacterial infection is possible. (NOTE)       Sepsis PCT Algorithm           Lower Respiratory Tract                                      Infection PCT Algorithm    ----------------------------     ----------------------------         PCT < 0.25 ng/mL                PCT < 0.10 ng/mL         Strongly encourage             Strongly discourage   discontinuation of antibiotics    initiation of antibiotics    ----------------------------     -----------------------------       PCT 0.25 - 0.50 ng/mL            PCT 0.10 - 0.25 ng/mL                OR       >80% decrease in PCT            Discourage initiation of  antibiotics      Encourage discontinuation           of antibiotics    ----------------------------     -----------------------------         PCT >= 0.50 ng/mL              PCT 0.26 - 0.50 ng/mL               AND        <80% decrease in PCT             Encourage initiation of                                             antibiotics       Encourage continuation           of antibiotics    ----------------------------     -----------------------------        PCT >= 0.50 ng/mL                  PCT > 0.50 ng/mL               AND         increase in PCT                  Strongly encourage                                      initiation of antibiotics    Strongly encourage escalation           of antibiotics                                     -----------------------------                                           PCT <= 0.25 ng/mL                                                 OR                                        > 80% decrease in PCT                                     Discontinue / Do not initiate                                             antibiotics Performed at [facility], [place] [address] [place], [place] [address]   D-dimer, quantitative (not at [facility])     Status: Abnormal   Collection  Time: 04/27/19 10:13 AM  Result Value Ref Range   D-Dimer, Quant 0.80 (H) 0.00 - 0.50 ug/mL-FEU    Comment: (NOTE) At the manufacturer cut-off of 0.50 ug/mL FEU, this assay has been documented to exclude PE with a sensitivity and negative predictive value of 97 to 99%.  At this time, this assay has not been approved by the FDA to exclude DVT/VTE. Results should be correlated with clinical presentation. Performed at [facility], [place] [address] [place], [place] [address]    Dg Chest 2 View  Result Date: 04/27/2019 CLINICAL DATA:  Chest pain EXAM: CHEST - 2  VIEW COMPARISON:  10/12/2011 FINDINGS: No pleural effusion. Cardiomegaly with vascular congestion. Increased interstitial opacity and patchy airspace disease at the bases. No pneumothorax. IMPRESSION: 1. Increased interstitial opacity and patchy airspace disease at the bases, possible atypical pneumonia/infection 2. Mild cardiomegaly Electronically Signed   By: Donavan Foil M.D.   On: 04/27/2019 01:16    Pending Labs Unresulted Labs (From admission, onward)    Start     Ordered   05/04/19 0500  Creatinine, serum  (enoxaparin (LOVENOX)    CrCl >/= 30 ml/min)  Weekly,   R    Comments: while on enoxaparin therapy    04/27/19 1315   04/28/19 0500  CBC with Differential/Platelet  Daily,   R     04/27/19 1315   04/28/19 0500  Comprehensive metabolic panel  Daily,   R     04/27/19 1315   04/28/19 0500  C-reactive protein  Daily,   R     04/27/19 1315   04/28/19 0500  D-dimer, quantitative (not at [facility])  Daily,   R     04/27/19 1315   04/28/19 0500  Ferritin  Daily,   R     04/27/19 1315   04/28/19 0500  Magnesium  Daily,   R     04/27/19 1315   04/28/19 0500  Phosphorus  Daily,   R     04/27/19 1315   04/27/19 1421  Hepatitis panel, acute  Add-on,   AD     04/27/19 1420   04/27/19 1313  Respiratory Panel by PCR  Add-on,   AD     04/27/19 1315   04/27/19 1313  Influenza panel by PCR (type A & B)  Add-on,   AD     04/27/19 1315   04/27/19 1310  HIV4GL Save Tube  (HIV Antibody (Routine testing w reflex) panel)  Once,   STAT     04/27/19 1315   04/27/19 1310  ABO/Rh  Once,   STAT     04/27/19 1315          Vitals/Pain Today's Vitals   04/27/19 1400 04/27/19 1407 04/27/19 1430 04/27/19 1530  BP: (!) 145/87  133/72 139/74  Pulse: 87  86 85  Resp: (!) 32   10  Temp:      TempSrc:      SpO2: 98%  98% 97%  Weight:      Height:      PainSc:  1       Isolation Precautions Airborne and Contact precautions  Medications Medications  sodium chloride flush (NS) 0.9 % injection 3  mL (3 mLs Intravenous Not Given 04/27/19 0840)  chlorthalidone (HYGROTON) tablet 25 mg (25 mg Oral Given 04/27/19 1406)  metoprolol succinate (TOPROL-XL) 24 hr tablet 25 mg (25 mg Oral Given 04/27/19 0903)  enoxaparin (LOVENOX) injection 40 mg (has no administration in time  range)  dexamethasone (DECADRON) tablet 6 mg (6 mg Oral Given 04/27/19 1548)  vitamin C (ASCORBIC ACID) tablet 500 mg (500 mg Oral Given 04/27/19 1548)  zinc sulfate capsule 220 mg (has no administration in time range)  acetaminophen (TYLENOL) tablet 650 mg (has no administration in time range)  ondansetron (ZOFRAN) tablet 4 mg (has no administration in time range)    Or  ondansetron (ZOFRAN) injection 4 mg (has no administration in time range)  minocycline (MINOCIN) capsule 100 mg (has no administration in time range)  acetaminophen (TYLENOL) solution 650 mg (650 mg Oral Given 04/27/19 0031)  albuterol (VENTOLIN HFA) 108 (90 Base) MCG/ACT inhaler 6 puff (6 puffs Inhalation Given 04/27/19 0902)  aerochamber plus with mask device 1 each (1 each Other Given 04/27/19 1229)  methylPREDNISolone sodium succinate (SOLU-MEDROL) 125 mg/2 mL injection 125 mg (125 mg Intravenous Given 04/27/19 0916)  magnesium sulfate IVPB 2 g 50 mL (0 g Intravenous Stopped 04/27/19 1016)  cefTRIAXone (ROCEPHIN) 1 g in sodium chloride 0.9 % 100 mL IVPB (0 g Intravenous Stopped 04/27/19 0946)  azithromycin (ZITHROMAX) tablet 500 mg (500 mg Oral Given 04/27/19 0902)  ibuprofen (ADVIL) tablet 800 mg (800 mg Oral Given 04/27/19 1228)    Mobility walks with person assist Low fall risk   Focused Assessments resp   R Recommendations: See Admitting Provider Note  Report given to:   Additional Notes:  To [facility] for [facility].

## 2019-04-27 NOTE — ED Triage Notes (Signed)
The pt is c/o chest pain for 3 days non-productive cough she has also had   Sob nausea and vomiting and dizziness   lmp none

## 2019-04-27 NOTE — ED Notes (Signed)
Attempted to call report to [facility] at this time, no answer.
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
## 2019-04-27 NOTE — ED Notes (Signed)
Pt's spo2 on RA was 90%. Pt placed on 2L [place].

## 2019-04-28 DIAGNOSIS — J189 Pneumonia, unspecified organism: Secondary | ICD-10-CM

## 2019-04-28 LAB — HEPATITIS PANEL, ACUTE
HCV Ab: NONREACTIVE
Hep A IgM: NONREACTIVE
Hep B C IgM: NONREACTIVE
Hepatitis B Surface Ag: NONREACTIVE

## 2019-04-28 LAB — COMPREHENSIVE METABOLIC PANEL
ALT: 22 U/L (ref 0–44)
AST: 31 U/L (ref 15–41)
Albumin: 3.8 g/dL (ref 3.5–5.0)
Alkaline Phosphatase: 49 U/L (ref 38–126)
Anion gap: 9 (ref 5–15)
BUN: 12 mg/dL (ref 6–20)
CO2: 24 mmol/L (ref 22–32)
Calcium: 8.6 mg/dL — ABNORMAL LOW (ref 8.9–10.3)
Chloride: 105 mmol/L (ref 98–111)
Creatinine, Ser: 0.77 mg/dL (ref 0.44–1.00)
GFR calc Af Amer: >60 mL/min (ref >60)
GFR calc non Af Amer: >60 mL/min (ref >60)
Glucose, Bld: 183 mg/dL — ABNORMAL HIGH (ref 70–99)
Potassium: 3.9 mmol/L (ref 3.5–5.1)
Sodium: 138 mmol/L (ref 135–145)
Total Bilirubin: 0.2 mg/dL — ABNORMAL LOW (ref 0.3–1.2)
Total Protein: 8.3 g/dL — ABNORMAL HIGH (ref 6.5–8.1)

## 2019-04-28 LAB — MAGNESIUM: Magnesium: 2.5 mg/dL — ABNORMAL HIGH (ref 1.7–2.4)

## 2019-04-28 LAB — CBC WITH DIFFERENTIAL/PLATELET
Abs Immature Granulocytes: 0.02 10*3/uL (ref 0.00–0.07)
Basophils Absolute: 0 10*3/uL (ref 0.0–0.1)
Basophils Relative: 0 %
Eosinophils Absolute: 0 10*3/uL (ref 0.0–0.5)
Eosinophils Relative: 0 %
HCT: 39.3 % (ref 36.0–46.0)
Hemoglobin: 12.3 g/dL (ref 12.0–15.0)
Immature Granulocytes: 1 %
Lymphocytes Relative: 47 %
Lymphs Abs: 1.3 10*3/uL (ref 0.7–4.0)
MCH: 26.2 pg (ref 26.0–34.0)
MCHC: 31.3 g/dL (ref 30.0–36.0)
MCV: 83.6 fL (ref 80.0–100.0)
Monocytes Absolute: 0.1 10*3/uL (ref 0.1–1.0)
Monocytes Relative: 5 %
Neutro Abs: 1.3 10*3/uL — ABNORMAL LOW (ref 1.7–7.7)
Neutrophils Relative %: 47 %
Platelets: 250 10*3/uL (ref 150–400)
RBC: 4.7 MIL/uL (ref 3.87–5.11)
RDW: 13.8 % (ref 11.5–15.5)
WBC: 2.7 10*3/uL — ABNORMAL LOW (ref 4.0–10.5)
nRBC: 0 % (ref 0.0–0.2)

## 2019-04-28 LAB — PHOSPHORUS: Phosphorus: 3.4 mg/dL (ref 2.5–4.6)

## 2019-04-28 LAB — FERRITIN: Ferritin: 225 ng/mL (ref 11–307)

## 2019-04-28 LAB — C-REACTIVE PROTEIN: CRP: 7.7 mg/dL — ABNORMAL HIGH (ref <1.0)

## 2019-04-28 LAB — D-DIMER, QUANTITATIVE: D-Dimer, Quant: 1.21 ug/mL-FEU — ABNORMAL HIGH (ref 0.00–0.50)

## 2019-04-28 MED ORDER — ENOXAPARIN SODIUM 80 MG/0.8ML ~~LOC~~ SOLN
70.0000 mg | SUBCUTANEOUS | Status: DC
  Administered 2019-04-29 – 2019-04-30 (×2): 70 mg via SUBCUTANEOUS
  Filled 2019-04-28 (×4): qty 0.8

## 2019-04-28 MED ORDER — ALBUTEROL SULFATE HFA 108 (90 BASE) MCG/ACT IN AERS
2.0000 | INHALATION_SPRAY | Freq: Four times a day (QID) | RESPIRATORY_TRACT | Status: DC | PRN
  Filled 2019-04-28: qty 6.7

## 2019-04-28 NOTE — Progress Notes (Addendum)
PROGRESS NOTE  Stacy Campos [id number] DOB: 24-Nov-1970 DOA: 04/27/2019 PCP: Caroline More, DO   LOS: 1 day   Brief Narrative / Interim history: 48 year old female with hypertension, morbid obesity, hidradenitis suppurativa, came into the hospital and was admitted on 04/27/2019 with chest pain, weakness, headache for the past 2 days.  Her symptoms of started on Thursday, 2 days prior to admission.  She was in [place] at that time where she was [place].  The next day she flew back to [place] and tells me that she wore a mask during her flight.  She also reports cough, body aches, generalized weakness, chills.  She denies any sick contacts in [place], she tells me she wore a mask at all times including when entering and exiting restaurants and is not sure where she got it from.  She was jet skiing couple of days prior to travel back and feels like that is what triggered her COVID  Subjective / 24h Interval events: Currently she feels the best she is felt in several days, denies any significant shortness of breath, no chest pain, no abdominal pain, no nausea or vomiting  Assessment & Plan: Principal Problem:   Respiratory failure with hypoxia ([place]) Active Problems:   Essential hypertension   Hydradenitis   Chest pain   Morbid obesity ([place])   Pneumonia due to COVID-19 virus   Scoliosis   Principal Problem Acute Hypoxic Respiratory Failure due to Covid-19 Viral Illness -Patient was admitted to the hospital with hypoxia requiring supplemental oxygen, chest x-ray on admission showed patchy airspace disease at the bases consistent with atypical pneumonia. -She was started on the Remdesivir, 10/3 >> 10/7 (last dose on Wed) -Started on Decadron, continue for 10 days -Wean off oxygen as tolerated as possible but currently remains hypoxic and at risk for decompensation  COVID-19 Labs  Recent Labs    04/27/19 1013 04/28/19 0119  DDIMER 0.80* 1.21*  FERRITIN 248 225  LDH 311*   --   CRP 9.0* 7.7*    Lab Results  Component Value Date   SARSCOV2NAA POSITIVE (A) 04/27/2019   Active Problems Hypertension -Continue metoprolol, chlorthalidone  Hidradenitis suppurativa -Continue minocycline  Morbid obesity, BMI 46 -Patient will benefit from weight loss  Scheduled Meds: . chlorthalidone  25 mg Oral Daily  . dexamethasone  6 mg Oral Q24H  . enoxaparin (LOVENOX) injection  40 mg Subcutaneous Q24H  . metoprolol succinate  25 mg Oral Daily  . minocycline  100 mg Oral BID  . sodium chloride flush  3 mL Intravenous Once  . vitamin C  500 mg Oral Daily  . zinc sulfate  220 mg Oral Daily   Continuous Infusions: . remdesivir 100 mg in NS 250 mL     PRN Meds:.acetaminophen, albuterol, ondansetron **OR** ondansetron (ZOFRAN) IV  DVT prophylaxis: Lovenox Code Status: Full code Family Communication: d/w patient  Disposition Plan: home when ready   Consultants:  None   Procedures:  None   Microbiology: None   Antimicrobials: None    Objective: Vitals:   04/27/19 1850 04/27/19 2040 04/28/19 0540 04/28/19 0900  BP: (!) 143/87 (!) 147/80 (!) 143/97 (!) 142/90  Pulse: 85 79 85 89  Resp: (!) 22 20    Temp:  98.2 F (36.8 C) 98.7 F (37.1 C)   TempSrc: Oral Oral Oral   SpO2: 93% 97% 96% 94%  Weight:      Height:        Intake/Output Summary (Last 24 hours) at 04/28/2019 1036  Last data filed at 04/28/2019 0900 Gross per 24 hour  Intake 432.22 ml  Output -  Net 432.22 ml   Filed Weights   04/27/19 0010  Weight: 136.1 kg    Examination:  Constitutional: NAD Eyes: PERRL, lids and conjunctivae normal ENMT: Mucous membranes are moist.  Respiratory: Faint rhonchi at the bases, no crackles, no wheezing.  Overall distant sounds due to morbid obesity Cardiovascular: Regular rate and rhythm, no murmurs / rubs / gallops. No LE edema.  Overall difficult exam due to morbid obesity Abdomen: no tenderness.  Musculoskeletal: no clubbing / cyanosis.  Skin: no rashes Neurologic: non focal     Data Reviewed: I have independently reviewed following labs and imaging studies   CBC: Recent Labs  Lab 04/27/19 0028 04/27/19 1013 04/28/19 0119  WBC 5.1 3.8* 2.7*  NEUTROABS  --   --  1.3*  HGB 12.3 12.1 12.3  HCT 39.1 37.7 39.3  MCV 84.4 84.2 83.6  PLT 224 208 599   Basic Metabolic Panel: Recent Labs  Lab 04/27/19 0028 04/27/19 1013 04/28/19 0119  NA 134*  --  138  K 3.4*  --  3.9  CL 100  --  105  CO2 24  --  24  GLUCOSE 115*  --  183*  BUN 7  --  12  CREATININE 0.83 0.85 0.77  CALCIUM 8.4*  --  8.6*  MG  --   --  2.5*  PHOS  --   --  3.4   GFR: Estimated Creatinine Clearance: 122.7 mL/min (by C-G formula based on SCr of 0.77 mg/dL). Liver Function Tests: Recent Labs  Lab 04/27/19 1310 04/28/19 0119  AST 34 31  ALT 21 22  ALKPHOS 47 49  BILITOT <0.1* 0.2*  PROT 7.7 8.3*  ALBUMIN 3.3* 3.8   No results for input(s): LIPASE, AMYLASE in the last 168 hours. No results for input(s): AMMONIA in the last 168 hours. Coagulation Profile: No results for input(s): INR, PROTIME in the last 168 hours. Cardiac Enzymes: No results for input(s): CKTOTAL, CKMB, CKMBINDEX, TROPONINI in the last 168 hours. BNP (last 3 results) No results for input(s): PROBNP in the last 8760 hours. HbA1C: No results for input(s): HGBA1C in the last 72 hours. CBG: No results for input(s): GLUCAP in the last 168 hours. Lipid Profile: No results for input(s): CHOL, HDL, LDLCALC, TRIG, CHOLHDL, LDLDIRECT in the last 72 hours. Thyroid Function Tests: No results for input(s): TSH, T4TOTAL, FREET4, T3FREE, THYROIDAB in the last 72 hours. Anemia Panel: Recent Labs    04/27/19 1013 04/28/19 0119  FERRITIN 248 225   Urine analysis:    Component Value Date/Time   COLORURINE YELLOW 05/27/2017 2156   APPEARANCEUR CLOUDY (A) 05/27/2017 2156   LABSPEC >1.030 (H) 05/27/2017 2156   PHURINE 6.0 05/27/2017 2156   GLUCOSEU NEGATIVE 05/27/2017 2156    HGBUR LARGE (A) 05/27/2017 2156   BILIRUBINUR NEGATIVE 05/27/2017 2156   BILIRUBINUR NEG 12/14/2015 1200   KETONESUR NEGATIVE 05/27/2017 2156   PROTEINUR 100 (A) 05/27/2017 2156   UROBILINOGEN 0.2 12/14/2015 1200   UROBILINOGEN 0.2 06/24/2009 1202   NITRITE POSITIVE (A) 05/27/2017 2156   LEUKOCYTESUR MODERATE (A) 05/27/2017 2156   Sepsis Labs: Invalid input(s): PROCALCITONIN, LACTICIDVEN  Recent Results (from the past 240 hour(s))  SARS Coronavirus 2 [facility] order, Performed in [facility] hospital lab) Nasopharyngeal Nasopharyngeal Swab     Status: Abnormal   Collection Time: 04/27/19  7:42 AM   Specimen: Nasopharyngeal Swab  Result Value Ref  Range Status   SARS Coronavirus 2 POSITIVE (A) NEGATIVE Final    Comment: RESULT CALLED TO, READ BACK BY AND VERIFIED WITH: Bethena Midget, [place] ED RN AT 1030 ON 04/27/19 BY C. JESSUP, MT. (NOTE) If result is NEGATIVE SARS-CoV-2 target nucleic acids are NOT DETECTED. The SARS-CoV-2 RNA is generally detectable in upper and lower  respiratory specimens during the acute phase of infection. The lowest  concentration of SARS-CoV-2 viral copies this assay can detect is 250  copies / mL. A negative result does not preclude SARS-CoV-2 infection  and should not be used as the sole basis for treatment or other  patient management decisions.  A negative result may occur with  improper specimen collection / handling, submission of specimen other  than nasopharyngeal swab, presence of viral mutation(s) within the  areas targeted by this assay, and inadequate number of viral copies  (<250 copies / mL). A negative result must be combined with clinical  observations, patient history, and epidemiological information. If result is POSITIVE SARS-CoV-2 target nucleic a cids are DETECTED. The SARS-CoV-2 RNA is generally detectable in upper and lower  respiratory specimens during the acute phase of infection.  Positive  results are indicative of active infection  with SARS-CoV-2.  Clinical  correlation with patient history and other diagnostic information is  necessary to determine patient infection status.  Positive results do  not rule out bacterial infection or co-infection with other viruses. If result is PRESUMPTIVE POSTIVE SARS-CoV-2 nucleic acids MAY BE PRESENT.   A presumptive positive result was obtained on the submitted specimen  and confirmed on repeat testing.  While 2019 novel coronavirus  (SARS-CoV-2) nucleic acids may be present in the submitted sample  additional confirmatory testing may be necessary for epidemiological  and / or clinical management purposes  to differentiate between  SARS-CoV-2 and other Sarbecovirus currently known to infect humans.  If clinically indicated additional testing with an alternate test  met hodology [phone]) is advised. The SARS-CoV-2 RNA is generally  detectable in upper and lower respiratory specimens during the acute  phase of infection. The expected result is Negative. Fact Sheet for Patients:  StrictlyIdeas.no Fact Sheet for Healthcare Providers: BankingDealers.co.za This test is not yet approved or cleared by the [place] FDA and has been authorized for detection and/or diagnosis of SARS-CoV-2 by FDA under an Emergency Use Authorization (EUA).  This EUA will remain in effect (meaning this test can be used) for the duration of the COVID-19 declaration under Section 564(b)(1) of the Act, 21 U.S.C. section 360bbb-3(b)(1), unless the authorization is terminated or revoked sooner. Performed at [facility], [place] [address], [place], [place] [address]       Radiology Studies: Dg Chest 2 View  Result Date: 04/27/2019 CLINICAL DATA:  Chest pain EXAM: CHEST - 2 VIEW COMPARISON:  10/12/2011 FINDINGS: No pleural effusion. Cardiomegaly with vascular congestion. Increased interstitial opacity and patchy airspace disease at the bases. No  pneumothorax. IMPRESSION: 1. Increased interstitial opacity and patchy airspace disease at the bases, possible atypical pneumonia/infection 2. Mild cardiomegaly Electronically Signed   By: Donavan Foil M.D.   On: 04/27/2019 01:16   Marzetta Board, MD, PhD [facility]  Contact via  [email or web address]  [place] P: [phone] F: [phone]

## 2019-04-29 LAB — COMPREHENSIVE METABOLIC PANEL
ALT: 25 U/L (ref 0–44)
AST: 31 U/L (ref 15–41)
Albumin: 3.5 g/dL (ref 3.5–5.0)
Alkaline Phosphatase: 45 U/L (ref 38–126)
Anion gap: 12 (ref 5–15)
BUN: 18 mg/dL (ref 6–20)
CO2: 23 mmol/L (ref 22–32)
Calcium: 8.5 mg/dL — ABNORMAL LOW (ref 8.9–10.3)
Chloride: 105 mmol/L (ref 98–111)
Creatinine, Ser: 0.76 mg/dL (ref 0.44–1.00)
GFR calc Af Amer: >60 mL/min (ref >60)
GFR calc non Af Amer: >60 mL/min (ref >60)
Glucose, Bld: 154 mg/dL — ABNORMAL HIGH (ref 70–99)
Potassium: 3.8 mmol/L (ref 3.5–5.1)
Sodium: 140 mmol/L (ref 135–145)
Total Bilirubin: 0.2 mg/dL — ABNORMAL LOW (ref 0.3–1.2)
Total Protein: 7.9 g/dL (ref 6.5–8.1)

## 2019-04-29 LAB — CBC
HCT: 38.6 % (ref 36.0–46.0)
Hemoglobin: 12 g/dL (ref 12.0–15.0)
MCH: 26.2 pg (ref 26.0–34.0)
MCHC: 31.1 g/dL (ref 30.0–36.0)
MCV: 84.3 fL (ref 80.0–100.0)
Platelets: 285 10*3/uL (ref 150–400)
RBC: 4.58 MIL/uL (ref 3.87–5.11)
RDW: 14.2 % (ref 11.5–15.5)
WBC: 9.8 10*3/uL (ref 4.0–10.5)
nRBC: 0 % (ref 0.0–0.2)

## 2019-04-29 LAB — C-REACTIVE PROTEIN: CRP: 3.7 mg/dL — ABNORMAL HIGH (ref <1.0)

## 2019-04-29 LAB — FERRITIN: Ferritin: 248 ng/mL (ref 11–307)

## 2019-04-29 LAB — D-DIMER, QUANTITATIVE: D-Dimer, Quant: 0.66 ug/mL-FEU — ABNORMAL HIGH (ref 0.00–0.50)

## 2019-04-29 MED ORDER — OXYCODONE HCL 5 MG PO TABS
5.0000 mg | ORAL_TABLET | Freq: Two times a day (BID) | ORAL | Status: DC | PRN
  Administered 2019-04-29 – 2019-04-30 (×2): 5 mg via ORAL
  Filled 2019-04-29 (×2): qty 1

## 2019-04-29 MED ORDER — PROSIGHT PO TABS
1.0000 | ORAL_TABLET | Freq: Every day | ORAL | Status: DC
  Administered 2019-04-29 – 2019-05-01 (×3): 1 via ORAL
  Filled 2019-04-29 (×4): qty 1

## 2019-04-29 MED ORDER — RISAQUAD PO CAPS
2.0000 | ORAL_CAPSULE | Freq: Every day | ORAL | Status: DC
  Administered 2019-04-29 – 2019-05-01 (×3): 2 via ORAL
  Filled 2019-04-29 (×3): qty 2

## 2019-04-29 NOTE — Progress Notes (Signed)
PROGRESS NOTE  Stacy Campos TDV:[id number] DOB: 06/10/1971 DOA: 04/27/2019 PCP: Caroline More, DO   LOS: 2 days   Brief Narrative / Interim history: 48 year old female with hypertension, morbid obesity, hidradenitis suppurativa, came into the hospital and was admitted on 04/27/2019 with chest pain, weakness, headache for the past 2 days.  Her symptoms of started on Thursday, 2 days prior to admission.  She was in [place] at that time where she was [place].  The next day she flew back to [place] and tells me that she wore a mask during her flight.  She also reports cough, body aches, generalized weakness, chills.  She denies any sick contacts in [place], she tells me she wore a mask at all times including when entering and exiting restaurants and is not sure where she got it from.  She was jet skiing couple of days prior to travel back and feels like that is what triggered her COVID  Subjective / 24h Interval events: Continues to feel well, no significant overnight events.  Remains on oxygen this morning.  Assessment & Plan: Principal Problem:   Respiratory failure with hypoxia ([place]) Active Problems:   Essential hypertension   Hydradenitis   Chest pain   Morbid obesity ([place])   Pneumonia due to COVID-19 virus   Scoliosis   Principal Problem Acute Hypoxic Respiratory Failure due to Covid-19 Viral Illness -Patient was admitted to the hospital with hypoxia requiring supplemental oxygen, chest x-ray on admission showed patchy airspace disease at the bases consistent with atypical pneumonia. -She was started on the Remdesivir, 10/3 >> 10/7 (last dose on Wed) -Started on Decadron, continue for 10 days -Wean off oxygen as tolerated, incentive spirometry  COVID-19 Labs  Recent Labs    04/27/19 1013 04/28/19 0119 04/29/19 0305  DDIMER 0.80* 1.21* 0.66*  FERRITIN 248 225 248  LDH 311*  --   --   CRP 9.0* 7.7* 3.7*    Lab Results  Component Value Date   SARSCOV2NAA  POSITIVE (A) 04/27/2019   Active Problems Hypertension -Continue metoprolol, chlorthalidone, blood pressure stable  Hidradenitis suppurativa -Continue minocycline  Morbid obesity, BMI 46 -Patient will benefit from weight loss  Scheduled Meds: . acidophilus  2 capsule Oral Daily  . chlorthalidone  25 mg Oral Daily  . dexamethasone  6 mg Oral Q24H  . enoxaparin (LOVENOX) injection  70 mg Subcutaneous Q24H  . metoprolol succinate  25 mg Oral Daily  . minocycline  100 mg Oral BID  . multivitamin  1 tablet Oral Daily  . sodium chloride flush  3 mL Intravenous Once  . vitamin C  500 mg Oral Daily  . zinc sulfate  220 mg Oral Daily   Continuous Infusions: . remdesivir 100 mg in NS 250 mL Stopped (04/28/19 1625)   PRN Meds:.acetaminophen, albuterol, ondansetron **OR** ondansetron (ZOFRAN) IV, oxyCODONE  DVT prophylaxis: Lovenox Code Status: Full code Family Communication: d/w patient  Disposition Plan: home when ready   Consultants:  None   Procedures:  None   Microbiology: None   Antimicrobials: None    Objective: Vitals:   04/29/19 0435 04/29/19 0840 04/29/19 1009 04/29/19 1143  BP: (!) 148/87 137/89 132/84 (!) 141/89  Pulse:  88  89  Resp:  16  17  Temp:  98.5 F (36.9 C)  99.1 F (37.3 C)  TempSrc:  Oral  Oral  SpO2:  93%  97%  Weight:      Height:        Intake/Output Summary (Last 24  hours) at 04/29/2019 1225 Last data filed at 04/29/2019 1039 Gross per 24 hour  Intake 1460 ml  Output -  Net 1460 ml   Filed Weights   04/27/19 0010  Weight: 136.1 kg    Examination:  Constitutional: No distress, sitting in bed Eyes: No scleral icterus seen ENMT: Moist mucous membranes Respiratory: Overall distant sound due to morbid obesity, but overall clear, no wheezing, no crackles Cardiovascular: Regular rate and rhythm, no murmurs.  No significant peripheral edema.  Overall difficult exam due to morbid obesity Abdomen: Bowel sounds positive  Musculoskeletal: no clubbing / cyanosis. Skin: No rashes appreciated   Data Reviewed: I have independently reviewed following labs and imaging studies   CBC: Recent Labs  Lab 04/27/19 0028 04/27/19 1013 04/28/19 0119 04/29/19 0305  WBC 5.1 3.8* 2.7* 9.8  NEUTROABS  --   --  1.3*  --   HGB 12.3 12.1 12.3 12.0  HCT 39.1 37.7 39.3 38.6  MCV 84.4 84.2 83.6 84.3  PLT 224 208 250 834   Basic Metabolic Panel: Recent Labs  Lab 04/27/19 0028 04/27/19 1013 04/28/19 0119 04/29/19 0305  NA 134*  --  138 140  K 3.4*  --  3.9 3.8  CL 100  --  105 105  CO2 24  --  24 23  GLUCOSE 115*  --  183* 154*  BUN 7  --  12 18  CREATININE 0.83 0.85 0.77 0.76  CALCIUM 8.4*  --  8.6* 8.5*  MG  --   --  2.5*  --   PHOS  --   --  3.4  --    GFR: Estimated Creatinine Clearance: 122.7 mL/min (by C-G formula based on SCr of 0.76 mg/dL). Liver Function Tests: Recent Labs  Lab 04/27/19 1310 04/28/19 0119 04/29/19 0305  AST 34 31 31  ALT _0 ALKPHOS 47 49 45  BILITOT <0.1* 0.2* 0.2*  PROT 7.7 8.3* 7.9  ALBUMIN 3.3* 3.8 3.5   No results for input(s): LIPASE, AMYLASE in the last 168 hours. No results for input(s): AMMONIA in the last 168 hours. Coagulation Profile: No results for input(s): INR, PROTIME in the last 168 hours. Cardiac Enzymes: No results for input(s): CKTOTAL, CKMB, CKMBINDEX, TROPONINI in the last 168 hours. BNP (last 3 results) No results for input(s): PROBNP in the last 8760 hours. HbA1C: No results for input(s): HGBA1C in the last 72 hours. CBG: No results for input(s): GLUCAP in the last 168 hours. Lipid Profile: No results for input(s): CHOL, HDL, LDLCALC, TRIG, CHOLHDL, LDLDIRECT in the last 72 hours. Thyroid Function Tests: No results for input(s): TSH, T4TOTAL, FREET4, T3FREE, THYROIDAB in the last 72 hours. Anemia Panel: Recent Labs    04/28/19 0119 04/29/19 0305  FERRITIN 225 248   Urine analysis:    Component Value Date/Time   COLORURINE  YELLOW 05/27/2017 2156   APPEARANCEUR CLOUDY (A) 05/27/2017 2156   LABSPEC >1.030 (H) 05/27/2017 2156   PHURINE 6.0 05/27/2017 2156   GLUCOSEU NEGATIVE 05/27/2017 2156   HGBUR LARGE (A) 05/27/2017 2156   BILIRUBINUR NEGATIVE 05/27/2017 2156   BILIRUBINUR NEG 12/14/2015 1200   KETONESUR NEGATIVE 05/27/2017 2156   PROTEINUR 100 (A) 05/27/2017 2156   UROBILINOGEN 0.2 12/14/2015 1200   UROBILINOGEN 0.2 06/24/2009 1202   NITRITE POSITIVE (A) 05/27/2017 2156   LEUKOCYTESUR MODERATE (A) 05/27/2017 2156   Sepsis Labs: Invalid input(s): PROCALCITONIN, LACTICIDVEN  Recent Results (from the past 240 hour(s))  SARS Coronavirus 2 [facility] order, Performed in  [facility] hospital lab) Nasopharyngeal Nasopharyngeal Swab     Status: Abnormal   Collection Time: 04/27/19  7:42 AM   Specimen: Nasopharyngeal Swab  Result Value Ref Range Status   SARS Coronavirus 2 POSITIVE (A) NEGATIVE Final    Comment: RESULT CALLED TO, READ BACK BY AND VERIFIED WITH: Bethena Midget, [place] ED RN AT 1030 ON 04/27/19 BY C. JESSUP, MT. (NOTE) If result is NEGATIVE SARS-CoV-2 target nucleic acids are NOT DETECTED. The SARS-CoV-2 RNA is generally detectable in upper and lower  respiratory specimens during the acute phase of infection. The lowest  concentration of SARS-CoV-2 viral copies this assay can detect is 250  copies / mL. A negative result does not preclude SARS-CoV-2 infection  and should not be used as the sole basis for treatment or other  patient management decisions.  A negative result may occur with  improper specimen collection / handling, submission of specimen other  than nasopharyngeal swab, presence of viral mutation(s) within the  areas targeted by this assay, and inadequate number of viral copies  (<250 copies / mL). A negative result must be combined with clinical  observations, patient history, and epidemiological information. If result is POSITIVE SARS-CoV-2 target nucleic a cids are DETECTED. The  SARS-CoV-2 RNA is generally detectable in upper and lower  respiratory specimens during the acute phase of infection.  Positive  results are indicative of active infection with SARS-CoV-2.  Clinical  correlation with patient history and other diagnostic information is  necessary to determine patient infection status.  Positive results do  not rule out bacterial infection or co-infection with other viruses. If result is PRESUMPTIVE POSTIVE SARS-CoV-2 nucleic acids MAY BE PRESENT.   A presumptive positive result was obtained on the submitted specimen  and confirmed on repeat testing.  While 2019 novel coronavirus  (SARS-CoV-2) nucleic acids may be present in the submitted sample  additional confirmatory testing may be necessary for epidemiological  and / or clinical management purposes  to differentiate between  SARS-CoV-2 and other Sarbecovirus currently known to infect humans.  If clinically indicated additional testing with an alternate test  met hodology [phone]) is advised. The SARS-CoV-2 RNA is generally  detectable in upper and lower respiratory specimens during the acute  phase of infection. The expected result is Negative. Fact Sheet for Patients:  StrictlyIdeas.no Fact Sheet for Healthcare Providers: BankingDealers.co.za This test is not yet approved or cleared by the [place] FDA and has been authorized for detection and/or diagnosis of SARS-CoV-2 by FDA under an Emergency Use Authorization (EUA).  This EUA will remain in effect (meaning this test can be used) for the duration of the COVID-19 declaration under Section 564(b)(1) of the Act, 21 U.S.C. section 360bbb-3(b)(1), unless the authorization is terminated or revoked sooner. Performed at [facility], [place] [address], [place], [place] [address]       Radiology Studies: No results found. Marzetta Board, MD, PhD [facility]  Contact via   [email or web address]  [place] P: [phone] F: [phone]

## 2019-04-29 NOTE — Progress Notes (Signed)
Patient remains on 2L [place]. Headache treated twice with PRN tylenol. Patient educated on PIV safety. Patient encouraged to use incentive spirometer. Will continue to monitor and continue with POC.

## 2019-04-30 LAB — C-REACTIVE PROTEIN: CRP: 1.7 mg/dL — ABNORMAL HIGH (ref <1.0)

## 2019-04-30 LAB — COMPREHENSIVE METABOLIC PANEL
ALT: 22 U/L (ref 0–44)
AST: 19 U/L (ref 15–41)
Albumin: 3.4 g/dL — ABNORMAL LOW (ref 3.5–5.0)
Alkaline Phosphatase: 43 U/L (ref 38–126)
Anion gap: 10 (ref 5–15)
BUN: 16 mg/dL (ref 6–20)
CO2: 26 mmol/L (ref 22–32)
Calcium: 8.1 mg/dL — ABNORMAL LOW (ref 8.9–10.3)
Chloride: 104 mmol/L (ref 98–111)
Creatinine, Ser: 0.64 mg/dL (ref 0.44–1.00)
GFR calc Af Amer: >60 mL/min (ref >60)
GFR calc non Af Amer: >60 mL/min (ref >60)
Glucose, Bld: 115 mg/dL — ABNORMAL HIGH (ref 70–99)
Potassium: 3.8 mmol/L (ref 3.5–5.1)
Sodium: 140 mmol/L (ref 135–145)
Total Bilirubin: 0.3 mg/dL (ref 0.3–1.2)
Total Protein: 7.5 g/dL (ref 6.5–8.1)

## 2019-04-30 LAB — CBC
HCT: 37.5 % (ref 36.0–46.0)
Hemoglobin: 11.6 g/dL — ABNORMAL LOW (ref 12.0–15.0)
MCH: 26.1 pg (ref 26.0–34.0)
MCHC: 30.9 g/dL (ref 30.0–36.0)
MCV: 84.5 fL (ref 80.0–100.0)
Platelets: 311 10*3/uL (ref 150–400)
RBC: 4.44 MIL/uL (ref 3.87–5.11)
RDW: 14 % (ref 11.5–15.5)
WBC: 7.3 10*3/uL (ref 4.0–10.5)
nRBC: 0 % (ref 0.0–0.2)

## 2019-04-30 LAB — D-DIMER, QUANTITATIVE: D-Dimer, Quant: 0.62 ug/mL-FEU — ABNORMAL HIGH (ref 0.00–0.50)

## 2019-04-30 NOTE — Progress Notes (Addendum)
Husband updated.

## 2019-04-30 NOTE — Progress Notes (Signed)
PROGRESS NOTE  Stacy Campos VHQ:[id number] DOB: [birth year] DOA: 04/27/2019 PCP: Caroline More, DO   LOS: 3 days   Brief Narrative / Interim history: 48 year old female with hypertension, morbid obesity, hidradenitis suppurativa, came into the hospital and was admitted on 04/27/2019 with chest pain, weakness, headache for the past 2 days.  Her symptoms of started on Thursday, 2 days prior to admission.  She was in [place] at that time where she was [place].  The next day she flew back to [place] and tells me that she wore a mask during her flight.  She also reports cough, body aches, generalized weakness, chills.  She denies any sick contacts in [place], she tells me she wore a mask at all times including when entering and exiting restaurants and is not sure where she got it from.  She was jet skiing couple of days prior to travel back and feels like that is what triggered her COVID  Subjective / 24h Interval events: Feels improved, no longer short of breath, on room air this morning  Assessment & Plan: Principal Problem:   Respiratory failure with hypoxia ([place]) Active Problems:   Essential hypertension   Hydradenitis   Chest pain   Morbid obesity ([place])   Pneumonia due to COVID-19 virus   Scoliosis   Principal Problem Acute Hypoxic Respiratory Failure due to Covid-19 Viral Illness -Patient was admitted to the hospital with hypoxia requiring supplemental oxygen, chest x-ray on admission showed patchy airspace disease at the bases consistent with atypical pneumonia. -She was started on the Remdesivir, 10/3 >> 10/7 (last dose on Wed) -Started on Decadron, continue for 10 days -Wean off oxygen as tolerated, incentive spirometry, currently on room air -CRP, d-dimer improving  COVID-19 Labs  Recent Labs    04/28/19 0119 04/29/19 0305 04/30/19 0457  DDIMER 1.21* 0.66* 0.62*  FERRITIN 225 248  --   CRP 7.7* 3.7* 1.7*    Lab Results  Component Value Date   SARSCOV2NAA  POSITIVE (A) 04/27/2019   Active Problems Hypertension -Continue metoprolol, chlorthalidone, blood pressure stable/on the high side  Hidradenitis suppurativa -Continue minocycline  Morbid obesity, BMI 46 -Patient will benefit from weight loss  Scheduled Meds: . acidophilus  2 capsule Oral Daily  . chlorthalidone  25 mg Oral Daily  . dexamethasone  6 mg Oral Q24H  . enoxaparin (LOVENOX) injection  70 mg Subcutaneous Q24H  . metoprolol succinate  25 mg Oral Daily  . minocycline  100 mg Oral BID  . multivitamin  1 tablet Oral Daily  . sodium chloride flush  3 mL Intravenous Once  . vitamin C  500 mg Oral Daily  . zinc sulfate  220 mg Oral Daily   Continuous Infusions: . remdesivir 100 mg in NS 250 mL Stopped (04/29/19 1700)   PRN Meds:.acetaminophen, albuterol, ondansetron **OR** ondansetron (ZOFRAN) IV, oxyCODONE  DVT prophylaxis: Lovenox Code Status: Full code Family Communication: d/w patient  Disposition Plan: home when ready, likely Wednesday after finishing Remdesivir  Consultants:  None   Procedures:  None   Microbiology: None   Antimicrobials: None    Objective: Vitals:   04/29/19 2040 04/30/19 0104 04/30/19 0345 04/30/19 0850  BP: (!) 148/93 131/78 (!) 150/92 (!) 150/87  Pulse: 81 74 84 93  Resp:    16  Temp: 99.6 F (37.6 C) 98.3 F (36.8 C) 99 F (37.2 C) 98.5 F (36.9 C)  TempSrc: Oral Oral Oral Oral  SpO2: 94% 93% 91% 93%  Weight:  Height:        Intake/Output Summary (Last 24 hours) at 04/30/2019 1145 Last data filed at 04/30/2019 0850 Gross per 24 hour  Intake 1270 ml  Output -  Net 1270 ml   Filed Weights   04/27/19 0010  Weight: 136.1 kg    Examination:  Constitutional: NAD, sitting at the edge of the bed, eating breakfast Eyes: No scleral icterus seen ENMT: Moist mucous membranes Respiratory: Overall difficult exam due to morbid obesity, clear to auscultation, no wheezing, no crackles Cardiovascular: Regular rate and  rhythm, no murmurs appreciated.  No edema overall difficult exam due to morbid obesity Abdomen: Positive bowel sounds, no distention Musculoskeletal: no clubbing / cyanosis. Skin: No rashes appreciated   Data Reviewed: I have independently reviewed following labs and imaging studies   CBC: Recent Labs  Lab 04/27/19 0028 04/27/19 1013 04/28/19 0119 04/29/19 0305 04/30/19 0457  WBC 5.1 3.8* 2.7* 9.8 7.3  NEUTROABS  --   --  1.3*  --   --   HGB 12.3 12.1 12.3 12.0 11.6*  HCT 39.1 37.7 39.3 38.6 37.5  MCV 84.4 84.2 83.6 84.3 84.5  PLT 224 208 250 285 229   Basic Metabolic Panel: Recent Labs  Lab 04/27/19 0028 04/27/19 1013 04/28/19 0119 04/29/19 0305 04/30/19 0457  NA 134*  --  138 140 140  K 3.4*  --  3.9 3.8 3.8  CL 100  --  105 105 104  CO2 24  --  24 23 26   GLUCOSE 115*  --  183* 154* 115*  BUN 7  --  12 18 16   CREATININE 0.83 0.85 0.77 0.76 0.64  CALCIUM 8.4*  --  8.6* 8.5* 8.1*  MG  --   --  2.5*  --   --   PHOS  --   --  3.4  --   --    GFR: Estimated Creatinine Clearance: 122.7 mL/min (by C-G formula based on SCr of 0.64 mg/dL). Liver Function Tests: Recent Labs  Lab 04/27/19 1310 04/28/19 0119 04/29/19 0305 04/30/19 0457  AST 34 31 31 19   ALT 21 22 25 22   ALKPHOS 47 49 45 43  BILITOT <0.1* 0.2* 0.2* 0.3  PROT 7.7 8.3* 7.9 7.5  ALBUMIN 3.3* 3.8 3.5 3.4*   No results for input(s): LIPASE, AMYLASE in the last 168 hours. No results for input(s): AMMONIA in the last 168 hours. Coagulation Profile: No results for input(s): INR, PROTIME in the last 168 hours. Cardiac Enzymes: No results for input(s): CKTOTAL, CKMB, CKMBINDEX, TROPONINI in the last 168 hours. BNP (last 3 results) No results for input(s): PROBNP in the last 8760 hours. HbA1C: No results for input(s): HGBA1C in the last 72 hours. CBG: No results for input(s): GLUCAP in the last 168 hours. Lipid Profile: No results for input(s): CHOL, HDL, LDLCALC, TRIG, CHOLHDL, LDLDIRECT in the last  72 hours. Thyroid Function Tests: No results for input(s): TSH, T4TOTAL, FREET4, T3FREE, THYROIDAB in the last 72 hours. Anemia Panel: Recent Labs    04/28/19 0119 04/29/19 0305  FERRITIN 225 248   Urine analysis:    Component Value Date/Time   COLORURINE YELLOW 05/27/2017 2156   APPEARANCEUR CLOUDY (A) 05/27/2017 2156   LABSPEC >1.030 (H) 05/27/2017 2156   PHURINE 6.0 05/27/2017 2156   GLUCOSEU NEGATIVE 05/27/2017 2156   HGBUR LARGE (A) 05/27/2017 2156   BILIRUBINUR NEGATIVE 05/27/2017 2156   BILIRUBINUR NEG 12/14/2015 1200   KETONESUR NEGATIVE 05/27/2017 2156   PROTEINUR 100 (A) 05/27/2017  2156   UROBILINOGEN 0.2 12/14/2015 1200   UROBILINOGEN 0.2 06/24/2009 1202   NITRITE POSITIVE (A) 05/27/2017 2156   LEUKOCYTESUR MODERATE (A) 05/27/2017 2156   Sepsis Labs: Invalid input(s): PROCALCITONIN, LACTICIDVEN  Recent Results (from the past 240 hour(s))  SARS Coronavirus 2 [facility] order, Performed in [facility] hospital lab) Nasopharyngeal Nasopharyngeal Swab     Status: Abnormal   Collection Time: 04/27/19  7:42 AM   Specimen: Nasopharyngeal Swab  Result Value Ref Range Status   SARS Coronavirus 2 POSITIVE (A) NEGATIVE Final    Comment: RESULT CALLED TO, READ BACK BY AND VERIFIED WITH: Bethena Midget, [place] ED RN AT 1030 ON 04/27/19 BY C. JESSUP, MT. (NOTE) If result is NEGATIVE SARS-CoV-2 target nucleic acids are NOT DETECTED. The SARS-CoV-2 RNA is generally detectable in upper and lower  respiratory specimens during the acute phase of infection. The lowest  concentration of SARS-CoV-2 viral copies this assay can detect is 250  copies / mL. A negative result does not preclude SARS-CoV-2 infection  and should not be used as the sole basis for treatment or other  patient management decisions.  A negative result may occur with  improper specimen collection / handling, submission of specimen other  than nasopharyngeal swab, presence of viral mutation(s) within the  areas targeted  by this assay, and inadequate number of viral copies  (<250 copies / mL). A negative result must be combined with clinical  observations, patient history, and epidemiological information. If result is POSITIVE SARS-CoV-2 target nucleic a cids are DETECTED. The SARS-CoV-2 RNA is generally detectable in upper and lower  respiratory specimens during the acute phase of infection.  Positive  results are indicative of active infection with SARS-CoV-2.  Clinical  correlation with patient history and other diagnostic information is  necessary to determine patient infection status.  Positive results do  not rule out bacterial infection or co-infection with other viruses. If result is PRESUMPTIVE POSTIVE SARS-CoV-2 nucleic acids MAY BE PRESENT.   A presumptive positive result was obtained on the submitted specimen  and confirmed on repeat testing.  While 2019 novel coronavirus  (SARS-CoV-2) nucleic acids may be present in the submitted sample  additional confirmatory testing may be necessary for epidemiological  and / or clinical management purposes  to differentiate between  SARS-CoV-2 and other Sarbecovirus currently known to infect humans.  If clinically indicated additional testing with an alternate test  met hodology [phone]) is advised. The SARS-CoV-2 RNA is generally  detectable in upper and lower respiratory specimens during the acute  phase of infection. The expected result is Negative. Fact Sheet for Patients:  StrictlyIdeas.no Fact Sheet for Healthcare Providers: BankingDealers.co.za This test is not yet approved or cleared by the [place] FDA and has been authorized for detection and/or diagnosis of SARS-CoV-2 by FDA under an Emergency Use Authorization (EUA).  This EUA will remain in effect (meaning this test can be used) for the duration of the COVID-19 declaration under Section 564(b)(1) of the Act, 21 U.S.C. section  360bbb-3(b)(1), unless the authorization is terminated or revoked sooner. Performed at [facility], [place] [address] [place], [place] [address]       Radiology Studies: No results found. Marzetta Board, MD, PhD [facility]  Contact via  [email or web address]  [place] P: [phone] F: [phone]

## 2019-04-30 NOTE — Plan of Care (Signed)
Pt is anxious about meals that are brought from the cafeteria. She wants to order specific meals that don't include pork, beef, oatmeal and potatoes and she is very dissatisfied with what has been brought to her on food trays. Pt has been hungry and wants to get breakfast for tomorrow ordered. Pt complains of back pain and not requesting an analgesic at this time. Pt has been updating family with her cell phone. Pt is on room air and denies shortness of breath.

## 2019-05-01 LAB — COMPREHENSIVE METABOLIC PANEL
ALT: 20 U/L (ref 0–44)
AST: 18 U/L (ref 15–41)
Albumin: 3.6 g/dL (ref 3.5–5.0)
Alkaline Phosphatase: 49 U/L (ref 38–126)
Anion gap: 14 (ref 5–15)
BUN: 16 mg/dL (ref 6–20)
CO2: 24 mmol/L (ref 22–32)
Calcium: 8.5 mg/dL — ABNORMAL LOW (ref 8.9–10.3)
Chloride: 102 mmol/L (ref 98–111)
Creatinine, Ser: 0.66 mg/dL (ref 0.44–1.00)
GFR calc Af Amer: >60 mL/min (ref >60)
GFR calc non Af Amer: >60 mL/min (ref >60)
Glucose, Bld: 110 mg/dL — ABNORMAL HIGH (ref 70–99)
Potassium: 3.8 mmol/L (ref 3.5–5.1)
Sodium: 140 mmol/L (ref 135–145)
Total Bilirubin: 0.7 mg/dL (ref 0.3–1.2)
Total Protein: 7.8 g/dL (ref 6.5–8.1)

## 2019-05-01 LAB — CBC
HCT: 40 % (ref 36.0–46.0)
Hemoglobin: 12.5 g/dL (ref 12.0–15.0)
MCH: 26.2 pg (ref 26.0–34.0)
MCHC: 31.3 g/dL (ref 30.0–36.0)
MCV: 83.7 fL (ref 80.0–100.0)
Platelets: 342 10*3/uL (ref 150–400)
RBC: 4.78 MIL/uL (ref 3.87–5.11)
RDW: 13.7 % (ref 11.5–15.5)
WBC: 8.8 10*3/uL (ref 4.0–10.5)
nRBC: 0 % (ref 0.0–0.2)

## 2019-05-01 LAB — FERRITIN: Ferritin: 275 ng/mL (ref 11–307)

## 2019-05-01 LAB — C-REACTIVE PROTEIN: CRP: 0.9 mg/dL (ref <1.0)

## 2019-05-01 LAB — D-DIMER, QUANTITATIVE: D-Dimer, Quant: 0.55 ug/mL-FEU — ABNORMAL HIGH (ref 0.00–0.50)

## 2019-05-01 LAB — MAGNESIUM: Magnesium: 2.3 mg/dL (ref 1.7–2.4)

## 2019-05-01 MED ORDER — DEXAMETHASONE 6 MG PO TABS: 6.0000 mg | ORAL_TABLET | ORAL | 0 refills | Status: AC

## 2019-05-01 NOTE — Progress Notes (Signed)
Nsg Discharge Note  Admit Date:  04/27/2019 Discharge date: 05/01/2019   [name] to be D/C'd Home per MD order.  AVS completed.  Copy for chart, and copy for patient signed, and dated. Patient/caregiver able to verbalize understanding.  Discharge Medication: Allergies as of 05/01/2019      Reactions   Valsartan Swelling   Dimetapp [albertsons Di Bromm] Hives      Medication List    STOP taking these medications   doxycycline 100 MG tablet Commonly known as: VIBRA-TABS   HYDROcodone-acetaminophen 7.5-325 mg/15 ml solution Commonly known as: HYCET   ibuprofen 400 MG tablet Commonly known as: ADVIL   naproxen 500 MG tablet Commonly known as: Naprosyn     TAKE these medications   acetaminophen 325 MG tablet Commonly known as: TYLENOL Take 650 mg by mouth every 6 (six) hours as needed for mild pain or fever. What changed: Another medication with the same name was removed. Continue taking this medication, and follow the directions you see here.   chlorthalidone 25 MG tablet Commonly known as: HYGROTON Take 1 tablet (25 mg total) by mouth daily.   dexamethasone 6 MG tablet Commonly known as: DECADRON Take 1 tablet (6 mg total) by mouth daily for 5 days. Start taking on: May 02, 2019   ELDERBERRY PO Take 2 tablets by mouth daily. gummies   fluticasone 50 MCG/ACT nasal spray Commonly known as: FLONASE Place 2 sprays into both nostrils daily.   megestrol 40 MG tablet Commonly known as: MEGACE Take 1 tablet (40 mg total) by mouth daily.   metoprolol succinate 25 MG 24 hr tablet Commonly known as: TOPROL-XL TAKE 1 TABLET BY MOUTH EVERY EVENING WITH, OR IMMEDIATELY FOLLOWING A MEAL What changed: See the new instructions.   minocycline 100 MG capsule Commonly known as: MINOCIN Take 100 mg by mouth 2 (two) times daily.   multivitamin with minerals Tabs tablet Take 1 tablet by mouth daily.       Discharge Assessment: Vitals:   05/01/19 0528 05/01/19 0815   BP: (!) 148/95 139/83  Pulse: 81 82  Resp: 18   Temp: 98.2 F (36.8 C) (!) 97.4 F (36.3 C)  SpO2: 94% 94%   Skin clean, dry and intact without evidence of skin break down, no evidence of skin tears noted. IV catheter discontinued intact. Site without signs and symptoms of complications - no redness or edema noted at insertion site, patient denies c/o pain - only slight tenderness at site.  Dressing with slight pressure applied.  D/c Instructions-Education: Discharge instructions given to patient/family with verbalized understanding. D/c education completed with patient/family including follow up instructions, medication list, d/c activities limitations if indicated, with other d/c instructions as indicated by MD - patient able to verbalize understanding, all questions fully answered. Patient instructed to return to ED, call 911, or call MD for any changes in condition.  Patient escorted via [place], and D/C home via private auto.  Eda Keys, RN 05/01/2019 4:09 PM

## 2019-05-01 NOTE — Discharge Instructions (Signed)
Person Under Monitoring Name: Stacy Campos  Location: [address] [place] [address]   Infection Prevention Recommendations for Individuals Confirmed to have, or Being Evaluated for, 2019 Novel Coronavirus (COVID-19) Infection Who Receive Care at Home  Individuals who are confirmed to have, or are being evaluated for, COVID-19 should follow the prevention steps below until a healthcare provider or local or state health department says they can return to normal activities.  Stay home except to get medical care You should restrict activities outside your home, except for getting medical care. Do not go to work, school, or public areas, and do not use public transportation or taxis.  Call ahead before visiting your doctor Before your medical appointment, call the healthcare provider and tell them that you have, or are being evaluated for, COVID-19 infection. This will help the healthcare providers office take steps to keep other people from getting infected. Ask your healthcare provider to call the local or state health department.  Monitor your symptoms Seek prompt medical attention if your illness is worsening (e.g., difficulty breathing). Before going to your medical appointment, call the healthcare provider and tell them that you have, or are being evaluated for, COVID-19 infection. Ask your healthcare provider to call the local or state health department.  Wear a facemask You should wear a facemask that covers your nose and mouth when you are in the same room with other people and when you visit a healthcare provider. People who live with or visit you should also wear a facemask while they are in the same room with you.  Separate yourself from other people in your home As much as possible, you should stay in a different room from other people in your home. Also, you should use a separate bathroom, if available.  Avoid sharing household items You should not share  dishes, drinking glasses, cups, eating utensils, towels, bedding, or other items with other people in your home. After using these items, you should wash them thoroughly with soap and water.  Cover your coughs and sneezes Cover your mouth and nose with a tissue when you cough or sneeze, or you can cough or sneeze into your sleeve. Throw used tissues in a lined trash can, and immediately wash your hands with soap and water for at least 20 seconds or use an alcohol-based hand rub.  Wash your [facility] your hands often and thoroughly with soap and water for at least 20 seconds. You can use an alcohol-based hand sanitizer if soap and water are not available and if your hands are not visibly dirty. Avoid touching your eyes, nose, and mouth with unwashed hands.   Prevention Steps for Caregivers and Household Members of Individuals Confirmed to have, or Being Evaluated for, COVID-19 Infection Being Cared for in the Home  If you live with, or provide care at home for, a person confirmed to have, or being evaluated for, COVID-19 infection please follow these guidelines to prevent infection:  Follow healthcare providers instructions Make sure that you understand and can help the patient follow any healthcare provider instructions for all care.  Provide for the patients basic needs You should help the patient with basic needs in the home and provide support for getting groceries, prescriptions, and other personal needs.  Monitor the patients symptoms If they are getting sicker, call his or her medical provider and tell them that the patient has, or is being evaluated for, COVID-19 infection. This will help the healthcare providers office take  steps to keep other people from getting infected. Ask the healthcare provider to call the local or state health department.  Limit the number of people who have contact with the patient  If possible, have only one caregiver for the patient.  Other  household members should stay in another home or place of residence. If this is not possible, they should stay  in another room, or be separated from the patient as much as possible. Use a separate bathroom, if available.  Restrict visitors who do not have an essential need to be in the home.  Keep older adults, very young children, and other sick people away from the patient Keep older adults, very young children, and those who have compromised immune systems or chronic health conditions away from the patient. This includes people with chronic heart, lung, or kidney conditions, diabetes, and cancer.  Ensure good ventilation Make sure that shared spaces in the home have good air flow, such as from an air conditioner or an opened window, weather permitting.  Wash your hands often  Wash your hands often and thoroughly with soap and water for at least 20 seconds. You can use an alcohol based hand sanitizer if soap and water are not available and if your hands are not visibly dirty.  Avoid touching your eyes, nose, and mouth with unwashed hands.  Use disposable paper towels to dry your hands. If not available, use dedicated cloth towels and replace them when they become wet.  Wear a facemask and gloves  Wear a disposable facemask at all times in the room and gloves when you touch or have contact with the patients blood, body fluids, and/or secretions or excretions, such as sweat, saliva, sputum, nasal mucus, vomit, urine, or feces.  Ensure the mask fits over your nose and mouth tightly, and do not touch it during use.  Throw out disposable facemasks and gloves after using them. Do not reuse.  Wash your hands immediately after removing your facemask and gloves.  If your personal clothing becomes contaminated, carefully remove clothing and launder. Wash your hands after handling contaminated clothing.  Place all used disposable facemasks, gloves, and other waste in a lined container before  disposing them with other household waste.  Remove gloves and wash your hands immediately after handling these items.  Do not share dishes, glasses, or other household items with the patient  Avoid sharing household items. You should not share dishes, drinking glasses, cups, eating utensils, towels, bedding, or other items with a patient who is confirmed to have, or being evaluated for, COVID-19 infection.  After the person uses these items, you should wash them thoroughly with soap and water.  Wash laundry thoroughly  Immediately remove and wash clothes or bedding that have blood, body fluids, and/or secretions or excretions, such as sweat, saliva, sputum, nasal mucus, vomit, urine, or feces, on them.  Wear gloves when handling laundry from the patient.  Read and follow directions on labels of laundry or clothing items and detergent. In general, wash and dry with the warmest temperatures recommended on the label.  Clean all areas the individual has used often  Clean all touchable surfaces, such as counters, tabletops, doorknobs, bathroom fixtures, toilets, phones, keyboards, tablets, and bedside tables, every day. Also, clean any surfaces that may have blood, body fluids, and/or secretions or excretions on them.  Wear gloves when cleaning surfaces the patient has come in contact with.  Use a diluted bleach solution (e.g., dilute bleach with 1 part bleach  and 10 parts water) or a household disinfectant with a label that says EPA-registered for coronaviruses. To make a bleach solution at home, add 1 tablespoon of bleach to 1 quart (4 cups) of water. For a larger supply, add  cup of bleach to 1 gallon (16 cups) of water.  Read labels of cleaning products and follow recommendations provided on product labels. Labels contain instructions for safe and effective use of the cleaning product including precautions you should take when applying the product, such as wearing gloves or eye protection  and making sure you have good ventilation during use of the product.  Remove gloves and wash hands immediately after cleaning.  Monitor yourself for signs and symptoms of illness Caregivers and household members are considered close contacts, should monitor their health, and will be asked to limit movement outside of the home to the extent possible. Follow the monitoring steps for close contacts listed on the symptom monitoring form.   ? If you have additional questions, contact your local health department or call the epidemiologist on call at [phone] (available 24/7). ? This guidance is subject to change. For the most up-to-date guidance from [facility], please refer to their website: [email or web address]

## 2019-05-01 NOTE — Discharge Summary (Signed)
Stacy Campos, is a 48 y.o. female  DOB Oct 06, 1970  MRN [id number].  Admission date:  04/27/2019  Admitting Physician  Mckinley Jewel, MD  Discharge Date:  05/01/2019   Primary MD  Caroline More, DO  Recommendations for primary care physician for things to follow:  - please check CBC, CMP during next visit.   Admission Diagnosis  Hypoxia [R09.02] Atypical pneumonia [J18.9] COVID-19 [U07.1]   Discharge Diagnosis  Hypoxia [R09.02] Atypical pneumonia [J18.9] COVID-19 [U07.1]    Principal Problem:   Respiratory failure with hypoxia (HCC) Active Problems:   Essential hypertension   Hydradenitis   Chest pain   Morbid obesity ([place])   Pneumonia due to COVID-19 virus   Scoliosis      Past Medical History:  Diagnosis Date   Arthritis    "knees, back" (02/03/2017)   Chronic lower back pain    Daily headache    "related to my BP" (02/03/2017)   Elevated blood pressure reading without diagnosis of hypertension    Hidradenitis    "chronic; been ongoing for > 1 yr" (02/03/2017)   Hypertension    Scoliosis     Past Surgical History:  Procedure Laterality Date   DILATION AND CURETTAGE OF UTERUS     HYDRADENITIS EXCISION Left 06/07/2017   Procedure: EXCISION HIDRADENITIS OF LEFT BREAST;  Surgeon: Erroll Luna, MD;  Location: [facility];  Service: General;  Laterality: Left;   KNEE ARTHROSCOPY Bilateral 1992       History of present illness and  Hospital Course:     Kindly see H&P for history of present illness and admission details, please review complete Labs, Consult reports and Test reports for all details in brief  HPI  from the history and physical done on the day of admission 04/27/2019  HPI: Stacy Campos is a 48 y.o. female with medical history significant of hypertension, morbid obesity, hidradenitis operative well, scoliosis, spinal stenosis presents to  emergency department due to chest pain and headache since 2 days.  Patient reports that her symptoms began 2 days ago when she went to [place] with her friends for jet skiing.  Reports that her chest pain is severe, nonradiating, central, no aggravating or relieving factors, pressure-like, nonradiating.  Reports cough, body aches, headache, generalized weakness, fatigue and chills.  Her shortness of breath is mostly exertional.  She denies leg swelling, orthopnea, PND or history of CHF or asthma.  Denies blurry vision, lightheadedness, dizziness, wheezing, nausea, vomiting, abdominal pain, urinary or sleep changes.  She lives with her son and sister at home and denies smoking, alcohol, illicit drug use.  ED Course: Patient was found to have fever of 102.4, tachycardia, tachypnea, hypoxic and elevated blood pressure in 160s over 100.  She placed on 2 L of oxygen via nasal cannula.  COVID-19 came back positive.  Troponin x2-.  Lactic acid: Normal.  proBNP: Normal.  CBC: Shows no leukocytosis.  CMP: Hypokalemia.  Chest x-ray shows: Increased interstitial opacity and patchy airspace disease at the bases, possible  atypical pneumonia/infection.  Patient received breathing treatment, IV Rocephin and azithromycin and a loading dose of steroid in ED.  Hospital Course    Acute Hypoxic Respiratory Failure due to Covid-19 Viral Illness -Patient was admitted to the hospital with hypoxia requiring supplemental oxygen, chest x-ray on admission showed patchy airspace disease at the bases consistent with atypical pneumonia. -She was started on the Remdesivir, 10/3 >> 10/7 (last dose on Wed) -Started on Decadron, continue for 10 days, will receive another 5 days on discharge complete total of 10 days -Wean off oxygen as tolerated, incentive spirometry, currently on room air -CRP, d-dimer improving  COVID-19 Labs  Recent Labs    04/29/19 0305 04/30/19 0457 05/01/19 0525  DDIMER 0.66* 0.62* 0.55*  FERRITIN  248  --  275  CRP 3.7* 1.7* 0.9    Lab Results  Component Value Date   SARSCOV2NAA POSITIVE (A) 04/27/2019    Hypertension -Continue metoprolol, chlorthalidone,  Hidradenitis suppurativa -Continue minocycline     Discharge Condition: stable   Follow UP  Follow-up Information    Caroline More, DO Follow up in 10 day(s).   Specialty: Family Medicine Contact information: [address] [place] [address] [phone]             Discharge Instructions  and  Discharge Medications    Discharge Instructions    Diet - low sodium heart healthy   Complete by: As directed    Discharge instructions   Complete by: As directed    Follow with Primary MD Caroline More, DO   Get CBC, CMP checked  by Primary MD next visit.    Activity: As tolerated with Full fall precautions use walker/cane & assistance as needed   Disposition Home    Diet: Heart Healthy  For Heart failure patients - Check your Weight same time everyday, if you gain over 2 pounds, or you develop in leg swelling, experience more shortness of breath or chest pain, call your Primary MD immediately. Follow Cardiac Low Salt Diet and 1.5 lit/day fluid restriction.   On your next visit with your primary care physician please Get Medicines reviewed and adjusted.   Please request your Prim.MD to go over all Hospital Tests and Procedure/Radiological results at the follow up, please get all Hospital records sent to your Prim MD by signing hospital release before you go home.   If you experience worsening of your admission symptoms, develop shortness of breath, life threatening emergency, suicidal or homicidal thoughts you must seek medical attention immediately by calling 911 or calling your MD immediately  if symptoms less severe.  You Must read complete instructions/literature along with all the possible adverse reactions/side effects for all the Medicines you take and that have been  prescribed to you. Take any new Medicines after you have completely understood and accpet all the possible adverse reactions/side effects.   Do not drive, operating heavy machinery, perform activities at heights, swimming or participation in water activities or provide baby sitting services if your were admitted for syncope or siezures until you have seen by Primary MD or a Neurologist and advised to do so again.  Do not drive when taking Pain medications.    Do not take more than prescribed Pain, Sleep and Anxiety Medications  Special Instructions: If you have smoked or chewed Tobacco  in the last 2 yrs please stop smoking, stop any regular Alcohol  and or any Recreational drug use.  Wear Seat belts while driving.   Please note  You were cared for by a hospitalist during your hospital stay. If you have any questions about your discharge medications or the care you received while you were in the hospital after you are discharged, you can call the unit and asked to speak with the hospitalist on call if the hospitalist that took care of you is not available. Once you are discharged, your primary care physician will handle any further medical issues. Please note that NO REFILLS for any discharge medications will be authorized once you are discharged, as it is imperative that you return to your primary care physician (or establish a relationship with a primary care physician if you do not have one) for your aftercare needs so that they can reassess your need for medications and monitor your lab values.   Increase activity slowly   Complete by: As directed      Allergies as of 05/01/2019      Reactions   Valsartan Swelling   Dimetapp [albertsons Di Bromm] Hives      Medication List    STOP taking these medications   doxycycline 100 MG tablet Commonly known as: VIBRA-TABS   HYDROcodone-acetaminophen 7.5-325 mg/15 ml solution Commonly known as: HYCET   ibuprofen 400 MG tablet Commonly  known as: ADVIL   naproxen 500 MG tablet Commonly known as: Naprosyn     TAKE these medications   acetaminophen 325 MG tablet Commonly known as: TYLENOL Take 650 mg by mouth every 6 (six) hours as needed for mild pain or fever. What changed: Another medication with the same name was removed. Continue taking this medication, and follow the directions you see here.   chlorthalidone 25 MG tablet Commonly known as: HYGROTON Take 1 tablet (25 mg total) by mouth daily.   dexamethasone 6 MG tablet Commonly known as: DECADRON Take 1 tablet (6 mg total) by mouth daily for 5 days. Start taking on: May 02, 2019   ELDERBERRY PO Take 2 tablets by mouth daily. gummies   fluticasone 50 MCG/ACT nasal spray Commonly known as: FLONASE Place 2 sprays into both nostrils daily.   megestrol 40 MG tablet Commonly known as: MEGACE Take 1 tablet (40 mg total) by mouth daily.   metoprolol succinate 25 MG 24 hr tablet Commonly known as: TOPROL-XL TAKE 1 TABLET BY MOUTH EVERY EVENING WITH, OR IMMEDIATELY FOLLOWING A MEAL What changed: See the new instructions.   minocycline 100 MG capsule Commonly known as: MINOCIN Take 100 mg by mouth 2 (two) times daily.   multivitamin with minerals Tabs tablet Take 1 tablet by mouth daily.         Diet and Activity recommendation: See Discharge Instructions above   Consults obtained -  None   Major procedures and Radiology Reports - PLEASE review detailed and final reports for all details, in brief -     Dg Chest 2 View  Result Date: 04/27/2019 CLINICAL DATA:  Chest pain EXAM: CHEST - 2 VIEW COMPARISON:  10/12/2011 FINDINGS: No pleural effusion. Cardiomegaly with vascular congestion. Increased interstitial opacity and patchy airspace disease at the bases. No pneumothorax. IMPRESSION: 1. Increased interstitial opacity and patchy airspace disease at the bases, possible atypical pneumonia/infection 2. Mild cardiomegaly Electronically Signed   By:  Donavan Foil M.D.   On: 04/27/2019 01:16    Micro Results   Recent Results (from the past 240 hour(s))  SARS Coronavirus 2 [facility] order, Performed in [facility] hospital lab) Nasopharyngeal Nasopharyngeal Swab     Status: Abnormal   Collection  Time: 04/27/19  7:42 AM   Specimen: Nasopharyngeal Swab  Result Value Ref Range Status   SARS Coronavirus 2 POSITIVE (A) NEGATIVE Final    Comment: RESULT CALLED TO, READ BACK BY AND VERIFIED WITH: Bethena Midget, Wade ED RN AT 1030 ON 04/27/19 BY C. JESSUP, MT. (NOTE) If result is NEGATIVE SARS-CoV-2 target nucleic acids are NOT DETECTED. The SARS-CoV-2 RNA is generally detectable in upper and lower  respiratory specimens during the acute phase of infection. The lowest  concentration of SARS-CoV-2 viral copies this assay can detect is 250  copies / mL. A negative result does not preclude SARS-CoV-2 infection  and should not be used as the sole basis for treatment or other  patient management decisions.  A negative result may occur with  improper specimen collection / handling, submission of specimen other  than nasopharyngeal swab, presence of viral mutation(s) within the  areas targeted by this assay, and inadequate number of viral copies  (<250 copies / mL). A negative result must be combined with clinical  observations, patient history, and epidemiological information. If result is POSITIVE SARS-CoV-2 target nucleic a cids are DETECTED. The SARS-CoV-2 RNA is generally detectable in upper and lower  respiratory specimens during the acute phase of infection.  Positive  results are indicative of active infection with SARS-CoV-2.  Clinical  correlation with patient history and other diagnostic information is  necessary to determine patient infection status.  Positive results do  not rule out bacterial infection or co-infection with other viruses. If result is PRESUMPTIVE POSTIVE SARS-CoV-2 nucleic acids MAY BE PRESENT.   A presumptive  positive result was obtained on the submitted specimen  and confirmed on repeat testing.  While 2019 novel coronavirus  (SARS-CoV-2) nucleic acids may be present in the submitted sample  additional confirmatory testing may be necessary for epidemiological  and / or clinical management purposes  to differentiate between  SARS-CoV-2 and other Sarbecovirus currently known to infect humans.  If clinically indicated additional testing with an alternate test  met hodology [phone]) is advised. The SARS-CoV-2 RNA is generally  detectable in upper and lower respiratory specimens during the acute  phase of infection. The expected result is Negative. Fact Sheet for Patients:  StrictlyIdeas.no Fact Sheet for Healthcare Providers: BankingDealers.co.za This test is not yet approved or cleared by the [place] FDA and has been authorized for detection and/or diagnosis of SARS-CoV-2 by FDA under an Emergency Use Authorization (EUA).  This EUA will remain in effect (meaning this test can be used) for the duration of the COVID-19 declaration under Section 564(b)(1) of the Act, 21 U.S.C. section 360bbb-3(b)(1), unless the authorization is terminated or revoked sooner. Performed at [facility], [place] [address] [place], [place] [address]        Today   Subjective:   Stacy Campos today has no headache,no chest abdominal pain,no new weakness tingling or numbness, feels much better wants to go home today.   Objective:   Blood pressure 139/83, pulse 82, temperature (!) 97.4 F (36.3 C), temperature source Oral, resp. rate 18, height 5' 7"  (1.702 m), weight 136.1 kg, SpO2 94 %.   Intake/Output Summary (Last 24 hours) at 05/01/2019 1320 Last data filed at 05/01/2019 0500 Gross per 24 hour  Intake 1240 ml  Output --  Net 1240 ml    Exam Awake Alert, Oriented x 3, No new F.N deficits, Normal affect Symmetrical Chest wall movement, Good  air movement bilaterally, CTAB RRR,No Gallops,Rubs or new Murmurs,  No Parasternal Heave +ve B.Sounds, Abd Soft, Non tender,No rebound -guarding or rigidity. No Cyanosis, Clubbing or edema, No new Rash or bruise  Data Review   CBC w Diff:  Lab Results  Component Value Date   WBC 8.8 05/01/2019   HGB 12.5 05/01/2019   HCT 40.0 05/01/2019   PLT 342 05/01/2019   LYMPHOPCT 47 04/28/2019   MONOPCT 5 04/28/2019   EOSPCT 0 04/28/2019   BASOPCT 0 04/28/2019    CMP:  Lab Results  Component Value Date   NA 140 05/01/2019   NA 139 05/30/2018   K 3.8 05/01/2019   CL 102 05/01/2019   CO2 24 05/01/2019   BUN 16 05/01/2019   BUN 14 05/30/2018   CREATININE 0.66 05/01/2019   CREATININE 0.70 06/08/2016   PROT 7.8 05/01/2019   ALBUMIN 3.6 05/01/2019   BILITOT 0.7 05/01/2019   ALKPHOS 49 05/01/2019   AST 18 05/01/2019   ALT 20 05/01/2019  .   Total Time in preparing paper work, data evaluation and todays exam - 98 minutes  Phillips Climes M.D on 05/01/2019 at 1:20 PM  [facility]   Office  [phone]

## 2019-05-01 NOTE — Care Management (Signed)
Pt will discharge home today - pts daughter will transport her home via private vehicle.  Pt confirms she has PCP and denied barriers with paying for discharge prescriptions.  Pt requests that CM set up a virtual visit with PCP - CM made first available appt with PCP and informed that pt is COVID positive (pt in agreement with CM sharing dx with PCP).  Please see AVS for appt

## 2019-05-08 ENCOUNTER — Other Ambulatory Visit: Payer: Self-pay

## 2019-05-08 ENCOUNTER — Telehealth (INDEPENDENT_AMBULATORY_CARE_PROVIDER_SITE_OTHER): Payer: Medicaid Other | Admitting: Family Medicine

## 2019-05-08 DIAGNOSIS — U071 COVID-19: Secondary | ICD-10-CM | POA: Diagnosis not present

## 2019-05-08 MED ORDER — ALBUTEROL SULFATE HFA 108 (90 BASE) MCG/ACT IN AERS: 2.0000 | INHALATION_SPRAY | Freq: Four times a day (QID) | RESPIRATORY_TRACT | 1 refills | Status: DC | PRN

## 2019-05-08 NOTE — Progress Notes (Signed)
[facility] Telemedicine Visit  Patient consented to have virtual visit. Method of visit: Telephone  Encounter participants: Patient: Stacy Campos - located at home Provider: Guadalupe Dawn - located at [facility]  Chief Complaint: covid-19 followup  HPI: 48 year old female who presents for COVID-19 follow-up.  She was admitted on 04/27/2019.  She was started on remdesivir completed a 5-day course of that medication, as well as a 10-day course of Decadron.  States that she is still suffering from more mild symptoms such as incredible fatigue, taste and smell loss.  Another symptom is that she is having some wheezing at night when she is trying to sleep.  She has run out of her albuterol and would like to have a refill of this medication.  Otherwise she is having no issues and is on the slow road to recovery  ROS: per HPI  Pertinent PMHx: COVID-19  Exam:  General: Well sounding, no acute distress, very pleasant Respiratory: Able speak in clear coherent sentences, no respiratory distress, no wheezing appreciated to phone Psych: Very pleasant, appropriate, coherent thought process  Assessment/Plan:  Pneumonia due to COVID-19 virus Slowly improving from the standpoint now.  Finished her course of steroids.  Now just dealing with more mild symptoms such as fatigue, taste loss, smell loss.  Feeling with some mild wheezing at night but is not having any distress.  Refill albuterol inhaler so she will have this available to her.  Discussed return precautions, follow-up as needed.    Time spent during visit with patient: 11 minutes

## 2019-05-09 NOTE — Assessment & Plan Note (Signed)
Slowly improving from the standpoint now.  Finished her course of steroids.  Now just dealing with more mild symptoms such as fatigue, taste loss, smell loss.  Feeling with some mild wheezing at night but is not having any distress.  Refill albuterol inhaler so she will have this available to her.  Discussed return precautions, follow-up as needed.

## 2019-05-14 DIAGNOSIS — L732 Hidradenitis suppurativa: Secondary | ICD-10-CM | POA: Diagnosis not present

## 2019-05-20 ENCOUNTER — Ambulatory Visit: Payer: Medicaid Other | Admitting: Family Medicine

## 2019-05-20 ENCOUNTER — Other Ambulatory Visit: Payer: Self-pay

## 2019-05-20 ENCOUNTER — Other Ambulatory Visit: Payer: Self-pay | Admitting: *Deleted

## 2019-05-20 VITALS — BP 148/104 | HR 103 | Wt 306.2 lb

## 2019-05-20 DIAGNOSIS — J1289 Other viral pneumonia: Secondary | ICD-10-CM | POA: Diagnosis not present

## 2019-05-20 DIAGNOSIS — L309 Dermatitis, unspecified: Secondary | ICD-10-CM

## 2019-05-20 DIAGNOSIS — U071 COVID-19: Secondary | ICD-10-CM

## 2019-05-20 DIAGNOSIS — B372 Candidiasis of skin and nail: Secondary | ICD-10-CM | POA: Diagnosis not present

## 2019-05-20 DIAGNOSIS — J1282 Pneumonia due to coronavirus disease 2019: Secondary | ICD-10-CM

## 2019-05-20 DIAGNOSIS — Z20822 Contact with and (suspected) exposure to covid-19: Secondary | ICD-10-CM

## 2019-05-20 MED ORDER — SPIROMETER KIT: 1.0000 | PACK | Freq: Four times a day (QID) | 0 refills | Status: DC

## 2019-05-20 MED ORDER — NYSTATIN 100000 UNIT/GM EX CREA: 1.0000 "application " | TOPICAL_CREAM | Freq: Two times a day (BID) | CUTANEOUS | 0 refills | Status: DC

## 2019-05-20 MED ORDER — HYDROCORTISONE 1 % EX OINT: 1.0000 "application " | TOPICAL_OINTMENT | Freq: Two times a day (BID) | CUTANEOUS | 0 refills | Status: DC

## 2019-05-20 NOTE — Progress Notes (Deleted)
  Subjective  CC: Hospitalization Follow-up  [id number] Stacy Campos is a 48 y.o. female who presents today with the following problems:  Rash  On thighs, arms, on lower abdomen and pruritic at times. She feels like she needs to take a shower to get it to calm down. Started about a week ago. Small little bumps in those specific areas, but no erythema. No history of eczema. She did start using albuterol a week ago. Has been using incentive spirometer.   Pertinent P/F/SHx: *** ROS: Pertinent ROS included in HPI. Objective  Physical Exam:  BP (!) 148/104   Pulse (!) 103   Wt (!) 306 lb 3.2 oz (138.9 kg)   SpO2 98%   BMI 47.96 kg/m  ***  Pertinent Labs:   Assessment & Plan    Problem List Items Addressed This Visit    None     Health Maintenance Due  Topic Date Due  . TETANUS/TDAP  12/24/2011   Health Maintenance discussed with patient and patient agrees to address when able.   Follow-up: {plan; follow up:13214}  Wilber Oliphant, M.D.  PGY-2  Family Medicine  [phone] 05/20/2019 10:12 AM
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
## 2019-05-20 NOTE — Patient Instructions (Addendum)
Dear Stacy Campos,   It was good to see you! Thank you for taking your time to come in to be seen. Today, we discussed the following:   Hospitalization Follow-up   I am glad you are doing well!  I have sent in and send tip spirometer to the pharmacy for you to continue to use.  Please continue use albuterol as needed.  Rashes  For the arm rashes, please use hydrocortisone cream for itching and keep the area well moisturized  For the upper thigh and underbelly rash, please use nystatin cream until the rash resolves.  Your blood pressure was elevated today.  Please make sure to follow-up with your primary care provider to make sure that you are on the right blood pressure medications.  Please follow up in 2 to 4 weeks or sooner for concerning or worsening symptoms.   Be well,   Zettie Cooley, M.D   [place] [phone]  *Sign up for MyChart for instant access to your health profile, labs, orders, upcoming appointments or to contact your provider with questions*  ===================================================================================  Skin Yeast Infection  A skin yeast infection is a condition in which there is an overgrowth of yeast (candida) that normally lives on the skin. This condition usually occurs in areas of the skin that are constantly warm and moist, such as the armpits or the groin. What are the causes? This condition is caused by a change in the normal balance of the yeast and bacteria that live on the skin. What increases the risk? You are more likely to develop this condition if you:  Are obese.  Are pregnant.  Take birth control pills.  Have diabetes.  Take antibiotic medicines.  Take steroid medicines.  Are malnourished.  Have a weak body defense system (immune system).  Are 50 years of age or older.  Wear tight clothing. What are the signs or symptoms? The most common symptom of this condition is itchiness in the  affected area. Other symptoms include:  Red, swollen area of the skin.  Bumps on the skin. How is this diagnosed?  This condition is diagnosed with a medical history and physical exam.  Your health care provider may check for yeast by taking light scrapings of the skin to be viewed under a microscope. How is this treated? This condition is treated with medicine. Medicines may be prescribed or be available over the counter. The medicines may be:  Taken by mouth (orally).  Applied as a cream or powder to your skin. Follow these instructions at home:   Take or apply over-the-counter and prescription medicines only as told by your health care provider.  Maintain a healthy weight. If you need help losing weight, talk with your health care provider.  Keep your skin clean and dry.  If you have diabetes, keep your blood sugar under control.  Keep all follow-up visits as told by your health care provider. This is important. Contact a health care provider if:  Your symptoms go away and then return.  Your symptoms do not get better with treatment.  Your symptoms get worse.  Your rash spreads.  You have a fever or chills.  You have new symptoms.  You have new warmth or redness of your skin. Summary  A skin yeast infection is a condition in which there is an overgrowth of yeast (candida) that normally lives on the skin. This condition is caused by a change in the normal balance of the yeast and bacteria  that live on the skin.  Take or apply over-the-counter and prescription medicines only as told by your health care provider.  Keep your skin clean and dry.  Contact a health care provider if your symptoms do not get better with treatment. This information is not intended to replace advice given to you by your health care provider. Make sure you discuss any questions you have with your health care provider. Document Released: 03/29/2011 Document Revised: 11/28/2017 Document  Reviewed: 11/28/2017 Elsevier Patient Education  2020 [employer].

## 2019-05-21 ENCOUNTER — Encounter: Payer: Self-pay | Admitting: Family Medicine

## 2019-05-21 DIAGNOSIS — L309 Dermatitis, unspecified: Secondary | ICD-10-CM

## 2019-05-21 DIAGNOSIS — B372 Candidiasis of skin and nail: Secondary | ICD-10-CM

## 2019-05-21 HISTORY — DX: Candidiasis of skin and nail: B37.2

## 2019-05-21 HISTORY — DX: Dermatitis, unspecified: L30.9

## 2019-05-21 LAB — COMPREHENSIVE METABOLIC PANEL
ALT: 22 IU/L (ref 0–32)
AST: 14 IU/L (ref 0–40)
Albumin/Globulin Ratio: 1.2 (ref 1.2–2.2)
Albumin: 3.9 g/dL (ref 3.8–4.8)
Alkaline Phosphatase: 72 IU/L (ref 39–117)
BUN/Creatinine Ratio: 7 — ABNORMAL LOW (ref 9–23)
BUN: 6 mg/dL (ref 6–24)
Bilirubin Total: 0.3 mg/dL (ref 0.0–1.2)
CO2: 25 mmol/L (ref 20–29)
Calcium: 9.1 mg/dL (ref 8.7–10.2)
Chloride: 103 mmol/L (ref 96–106)
Creatinine, Ser: 0.83 mg/dL (ref 0.57–1.00)
GFR calc Af Amer: 96 mL/min/{1.73_m2} (ref >59)
GFR calc non Af Amer: 83 mL/min/{1.73_m2} (ref >59)
Globulin, Total: 3.2 g/dL (ref 1.5–4.5)
Glucose: 119 mg/dL — ABNORMAL HIGH (ref 65–99)
Potassium: 4 mmol/L (ref 3.5–5.2)
Sodium: 141 mmol/L (ref 134–144)
Total Protein: 7.1 g/dL (ref 6.0–8.5)

## 2019-05-21 LAB — CBC WITH DIFFERENTIAL/PLATELET
Basophils Absolute: 0 10*3/uL (ref 0.0–0.2)
Basos: 0 %
EOS (ABSOLUTE): 0.4 10*3/uL (ref 0.0–0.4)
Eos: 6 %
Hematocrit: 36.2 % (ref 34.0–46.6)
Hemoglobin: 12.4 g/dL (ref 11.1–15.9)
Immature Grans (Abs): 0 10*3/uL (ref 0.0–0.1)
Immature Granulocytes: 0 %
Lymphocytes Absolute: 2.8 10*3/uL (ref 0.7–3.1)
Lymphs: 46 %
MCH: 26.8 pg (ref 26.6–33.0)
MCHC: 34.3 g/dL (ref 31.5–35.7)
MCV: 78 fL — ABNORMAL LOW (ref 79–97)
Monocytes Absolute: 0.2 10*3/uL (ref 0.1–0.9)
Monocytes: 3 %
Neutrophils Absolute: 2.8 10*3/uL (ref 1.4–7.0)
Neutrophils: 45 %
Platelets: 214 10*3/uL (ref 150–450)
RBC: 4.62 x10E6/uL (ref 3.77–5.28)
RDW: 13 % (ref 11.7–15.4)
WBC: 6.2 10*3/uL (ref 3.4–10.8)

## 2019-05-21 LAB — NOVEL CORONAVIRUS, NAA: SARS-CoV-2, NAA: NOT DETECTED

## 2019-05-21 NOTE — Assessment & Plan Note (Signed)
Hydrocortisone cream to upper arm areas for pruritis

## 2019-05-21 NOTE — Assessment & Plan Note (Signed)
Nystatin BID to affected sites in intertriginous areas.
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
## 2019-05-27 ENCOUNTER — Other Ambulatory Visit: Payer: Self-pay | Admitting: Family Medicine

## 2019-07-04 ENCOUNTER — Other Ambulatory Visit: Payer: Self-pay

## 2019-07-04 ENCOUNTER — Ambulatory Visit: Payer: Medicaid Other | Admitting: Family Medicine

## 2019-07-04 VITALS — BP 150/90 | HR 94 | Ht 67.0 in | Wt 314.4 lb

## 2019-07-04 DIAGNOSIS — Z1211 Encounter for screening for malignant neoplasm of colon: Secondary | ICD-10-CM | POA: Diagnosis not present

## 2019-07-04 DIAGNOSIS — Z1231 Encounter for screening mammogram for malignant neoplasm of breast: Secondary | ICD-10-CM | POA: Insufficient documentation

## 2019-07-04 DIAGNOSIS — Z9289 Personal history of other medical treatment: Secondary | ICD-10-CM | POA: Insufficient documentation

## 2019-07-04 DIAGNOSIS — R2232 Localized swelling, mass and lump, left upper limb: Secondary | ICD-10-CM

## 2019-07-04 HISTORY — DX: Encounter for screening for malignant neoplasm of colon: Z12.11

## 2019-07-04 HISTORY — DX: Localized swelling, mass and lump, left upper limb: R22.32

## 2019-07-04 NOTE — Progress Notes (Signed)
   Subjective:    Patient ID: Stacy Campos, female    DOB: 11/06/1970, 48 y.o.   MRN: [id number]   CC: pain in L hand/thumb  HPI:  Nodules in left palm: Patient presents today reporting 2-3 weeks of painful nodules in her left palm.  One of them is at the base of her middle and ring finger.  The other 2 are in the base of her left thumb and in the more distal aspect of the left thumb, all in the palmar aspect.  She also describes a sensation of her thumb getting stuck with motion, to the extent that she has to physically grip her thumb with the other hand to help move it into place.  Moving the joints and putting pressure on them because severe shooting pain up her left arm into the shoulder.  The patient is ambidextrous, but is primarily left-handed.  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  She has only been taking Tylenol to help relieve the pain.  She denies any fevers, headaches, chest pain, rashes, weight loss, myalgias, nausea, vomiting, or any recent injuries or falls.  Smoking status reviewed -former smoker  Review of Systems -see HPI   Objective:  BP (!) 150/90   Pulse 94   Ht 5\' 7"  (1.702 m)   Wt (!) 314 lb 6 oz (142.6 kg)   SpO2 97%   BMI 49.24 kg/m  Vitals and nursing note reviewed  Physical exam: General: well nourished, in no acute distress, pleasant patient Cardiac: RRR, clear S1 and S2, no murmurs appreciated Respiratory: CTA bilaterally, normal work of breathing Extremities: Left hand with 3 nodules on palmar aspect (1 in palm, 2 in thumb) that are exquisitely tender to touch, unable to determine if they are firm, and mobile.  Otherwise, no deformities, erythema, or ecchymosis appreciated in left hand.  Brisk capillary refill.  3+ radial pulse appreciated in left hand.  Phalen's test elicits pain/tingling in left first and third digits and distribution of the median nerve.  Hand strength is decreased secondary to pain.  No contractures appreciated. Skin: warm and dry, no  rashes noted  Assessment & Plan:   Palmar nodule, left Patient has 3 nodules in the palmar aspect of left hand.  These are exquisitely tender to touch.  Also, with history of difficulty moving left thumb with sensation of the joints "getting stuck".  Differential diagnosis includes Dupuytren's contracture/Palmar fibromatosis, trigger finger/stenosing flexor tenosynovitis, rheumatoid nodule, palmar fasciitis, and Neuroma.  -Patient referred to hand specialist - will defer imaging choice to them -Tylenol 650 mg every 4-6 hours as needed for pain with Aleve twice daily to relieve pain -Instructed follow-up with PCP within the next few weeks if symptoms worsen or do not improve  Encounter for screening colonoscopy -Patient referred to GI for colonoscopy  Encounter for screening mammogram for breast cancer -Patient referred for mammogram   Return in about 4 weeks (around 08/01/2019) for f/u Hand nodules.   Dr. Milus Banister [facility] Family [facility], PGY-2

## 2019-07-04 NOTE — Assessment & Plan Note (Addendum)
Patient has 3 nodules in the palmar aspect of left hand.  These are exquisitely tender to touch.  Also, with history of difficulty moving left thumb with sensation of the joints "getting stuck".  Differential diagnosis includes Dupuytren's contracture/Palmar fibromatosis, trigger finger/stenosing flexor tenosynovitis, rheumatoid nodule, palmar fasciitis, and Neuroma.  -Patient referred to hand specialist - will defer imaging choice to them -Tylenol 650 mg every 4-6 hours as needed for pain with Aleve twice daily to relieve pain -Instructed follow-up with PCP within the next few weeks if symptoms worsen or do not improve

## 2019-07-04 NOTE — Assessment & Plan Note (Signed)
Patient referred to GI for colonoscopy .

## 2019-07-04 NOTE — Assessment & Plan Note (Signed)
-  Patient referred for mammogram

## 2019-07-04 NOTE — Patient Instructions (Addendum)
Thank you for coming in to see [place] today! Please see below to review our plan for today's visit:  1. I have referred you to a Hand Specialist who can best take care of the painful nodules in your Left palm. Expect a call from a number you don't recognize to schedule the appt with them. 2. Take 2 Tylenol 325mg  every 4-6 hours as needed for pain. Take Aleve twice daily with the tylenol to relieve your pain. 3. Please follow up with your PCP Dr. Tammi Klippel in the next few weeks if you're no better or if your symptoms worsen.  Please call the clinic at [phone] if your symptoms worsen or you have any concerns. It was our pleasure to serve you!     Dr. Milus Banister [facility]

## 2019-07-08 ENCOUNTER — Encounter: Payer: Self-pay | Admitting: Gastroenterology

## 2019-07-17 ENCOUNTER — Other Ambulatory Visit: Payer: Self-pay | Admitting: Orthopedic Surgery

## 2019-07-17 ENCOUNTER — Ambulatory Visit
Admission: RE | Admit: 2019-07-17 | Discharge: 2019-07-17 | Disposition: A | Discharge status: Home / Self Care | Payer: Medicaid Other | Source: Ambulatory Visit | Attending: Orthopedic Surgery | Admitting: Orthopedic Surgery

## 2019-07-17 DIAGNOSIS — M65312 Trigger thumb, left thumb: Secondary | ICD-10-CM | POA: Diagnosis not present

## 2019-07-17 DIAGNOSIS — R200 Anesthesia of skin: Secondary | ICD-10-CM

## 2019-07-17 DIAGNOSIS — R202 Paresthesia of skin: Secondary | ICD-10-CM | POA: Diagnosis not present

## 2019-07-17 DIAGNOSIS — R2232 Localized swelling, mass and lump, left upper limb: Secondary | ICD-10-CM | POA: Diagnosis not present

## 2019-07-17 DIAGNOSIS — M542 Cervicalgia: Secondary | ICD-10-CM | POA: Diagnosis not present
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
## 2019-07-23 ENCOUNTER — Other Ambulatory Visit: Payer: Self-pay

## 2019-07-23 DIAGNOSIS — M542 Cervicalgia: Secondary | ICD-10-CM

## 2019-07-23 DIAGNOSIS — R200 Anesthesia of skin: Secondary | ICD-10-CM

## 2019-07-30 ENCOUNTER — Other Ambulatory Visit: Payer: Self-pay

## 2019-07-30 ENCOUNTER — Ambulatory Visit (INDEPENDENT_AMBULATORY_CARE_PROVIDER_SITE_OTHER): Payer: Medicaid Other | Admitting: Neurology

## 2019-07-30 DIAGNOSIS — M542 Cervicalgia: Secondary | ICD-10-CM

## 2019-07-30 DIAGNOSIS — G5603 Carpal tunnel syndrome, bilateral upper limbs: Secondary | ICD-10-CM

## 2019-07-30 DIAGNOSIS — R200 Anesthesia of skin: Secondary | ICD-10-CM | POA: Diagnosis not present

## 2019-07-30 NOTE — Procedures (Signed)
[facility]  [address]  [place], [place] [address] Tel: [phone] Fax:  [phone] Test Date:  07/30/2019  Patient: Stacy Campos DOB: [birth year] Physician: Narda Amber, DO  Sex: Female Height: 5\' 7"  Ref Phys: Daryll Brod, MD  ID#: [id number] Temp: 34.0C Technician:    Patient Complaints: This is a 49 year old female referred for evaluation of bilateral hand tingling.  NCV & EMG Findings: Extensive electrodiagnostic testing of the right upper extremity and additional studies of the left shows:  1. Right mixed palmar sensory responses show prolonged latency (Median Palm, 2.6 ms).  Left median sensory response shows prolonged latency (3.7 ms).  Bilateral ulnar sensory responses are within normal limits.  2. Bilateral median and ulnar motor responses are within normal limits.  3. There is no evidence of active or chronic motor axonal loss changes affecting any of the tested muscles.  Motor unit configuration and recruitment pattern is within normal limits.    Impression: Bilateral median neuropathy at or distal to the wrist (mild), consistent with a clinical diagnosis of carpal tunnel syndrome.    ___________________________ Narda Amber, DO    Nerve Conduction Studies Anti Sensory Summary Table   Site NR Peak (ms) Norm Peak (ms) P-T Amp (V) Norm P-T Amp  Left Median Anti Sensory (2nd Digit)  34C  Wrist    3.7 <3.6 25.6 >15  Right Median Anti Sensory (2nd Digit)  34C  Wrist    3.4 <3.6 24.5 >15  Left Ulnar Anti Sensory (5th Digit)  34C  Wrist    3.1 <3.1 12.6 >10  Right Ulnar Anti Sensory (5th Digit)  34C  Wrist    2.9 <3.1 14.2 >10   Motor Summary Table   Site NR Onset (ms) Norm Onset (ms) O-P Amp (mV) Norm O-P Amp Site1 Site2 Delta-0 (ms) Dist (cm) Vel (m/s) Norm Vel (m/s)  Left Median Motor (Abd Poll Brev)  34C  Wrist    3.7 <4.0 11.4 >6 Elbow Wrist 4.9 28.0 57 >50  Elbow    8.6  11.4         Right Median Motor (Abd Poll Brev)  34C    Wrist    3.3 <4.0 10.5 >6 Elbow Wrist 4.7 29.0 62 >50  Elbow    8.0  10.4         Left Ulnar Motor (Abd Dig Minimi)  34C  Wrist    2.5 <3.1 12.1 >7 B Elbow Wrist 4.6 26.0 57 >50  B Elbow    7.1  11.4  A Elbow B Elbow 2.0 10.0 50 >50  A Elbow    9.1  11.5         Right Ulnar Motor (Abd Dig Minimi)  34C  Wrist    2.0 <3.1 10.7 >7 B Elbow Wrist 4.9 26.0 53 >50  B Elbow    6.9  10.1  A Elbow B Elbow 1.9 10.0 53 >50  A Elbow    8.8  10.1          Comparison Summary Table   Site NR Peak (ms) Norm Peak (ms) P-T Amp (V) Site1 Site2 Delta-P (ms) Norm Delta (ms)  Right Median/Ulnar Palm Comparison (Wrist - 8cm)  34C  Median Palm    2.6 <2.2 21.7 Median Palm Ulnar Palm 0.9   Ulnar Palm    1.7 <2.2 15.0       EMG   Side Muscle Ins Act Fibs Psw Fasc Number Recrt Dur Dur. Amp Amp.  Poly Poly. Comment  Right 1stDorInt Nml Nml Nml Nml Nml Nml Nml Nml Nml Nml Nml Nml N/A  Right Abd Poll Brev Nml Nml Nml Nml Nml Nml Nml Nml Nml Nml Nml Nml N/A  Right PronatorTeres Nml Nml Nml Nml Nml Nml Nml Nml Nml Nml Nml Nml N/A  Right Biceps Nml Nml Nml Nml Nml Nml Nml Nml Nml Nml Nml Nml N/A  Right Triceps Nml Nml Nml Nml Nml Nml Nml Nml Nml Nml Nml Nml N/A  Right Deltoid Nml Nml Nml Nml Nml Nml Nml Nml Nml Nml Nml Nml N/A  Left 1stDorInt Nml Nml Nml Nml Nml Nml Nml Nml Nml Nml Nml Nml N/A  Left Abd Poll Brev Nml Nml Nml Nml Nml Nml Nml Nml Nml Nml Nml Nml N/A  Left PronatorTeres Nml Nml Nml Nml Nml Nml Nml Nml Nml Nml Nml Nml N/A  Left Biceps Nml Nml Nml Nml Nml Nml Nml Nml Nml Nml Nml Nml N/A  Left Triceps Nml Nml Nml Nml Nml Nml Nml Nml Nml Nml Nml Nml N/A  Left Deltoid Nml Nml Nml Nml Nml Nml Nml Nml Nml Nml Nml Nml N/A      Waveforms:

## 2019-08-02 ENCOUNTER — Other Ambulatory Visit: Payer: Self-pay | Admitting: Orthopedic Surgery

## 2019-08-02 DIAGNOSIS — R200 Anesthesia of skin: Secondary | ICD-10-CM | POA: Diagnosis not present

## 2019-08-02 DIAGNOSIS — G5602 Carpal tunnel syndrome, left upper limb: Secondary | ICD-10-CM | POA: Diagnosis not present

## 2019-08-02 DIAGNOSIS — M65312 Trigger thumb, left thumb: Secondary | ICD-10-CM | POA: Diagnosis not present

## 2019-08-05 ENCOUNTER — Ambulatory Visit (AMBULATORY_SURGERY_CENTER): Payer: Self-pay | Admitting: *Deleted

## 2019-08-05 ENCOUNTER — Other Ambulatory Visit: Payer: Self-pay

## 2019-08-05 VITALS — Temp 96.9°F | Ht 67.0 in | Wt 316.6 lb

## 2019-08-05 DIAGNOSIS — Z01818 Encounter for other preprocedural examination: Secondary | ICD-10-CM

## 2019-08-05 DIAGNOSIS — Z1211 Encounter for screening for malignant neoplasm of colon: Secondary | ICD-10-CM

## 2019-08-05 MED ORDER — NA SULFATE-K SULFATE-MG SULF 17.5-3.13-1.6 GM/177ML PO SOLN: ORAL | 0 refills | Status: DC

## 2019-08-05 NOTE — Progress Notes (Signed)
Patient is here in-person for PV. Patient denies any allergies to eggs or soy. Patient denies any problems with anesthesia/sedation. Patient denies any oxygen use at home. Patient denies taking any diet/weight loss medications or blood thinners. Patient is not being treated for MRSA or C-diff. EMMI education assisgned to the patient for the procedure, this was explained and instructions given to patient. COVID-19 screening test is on 1/20, the pt is aware. Pt is aware that care partner will wait in the car during procedure; if they feel like they will be too hot or cold to wait in the car; they may wait in the 4 th floor lobby. Patient is aware to bring only one care partner. We want them to wear a mask (we do not have any that we can provide them), practice social distancing, and we will check their temperatures when they get here.  I did remind the patient that their care partner needs to stay in the parking lot the entire time and have a cell phone available, we will call them when the pt is ready for discharge. Patient will wear mask into building.

## 2019-08-13 DIAGNOSIS — L732 Hidradenitis suppurativa: Secondary | ICD-10-CM | POA: Diagnosis not present

## 2019-08-14 ENCOUNTER — Other Ambulatory Visit: Payer: Self-pay | Admitting: Gastroenterology

## 2019-08-14 ENCOUNTER — Ambulatory Visit (INDEPENDENT_AMBULATORY_CARE_PROVIDER_SITE_OTHER): Payer: Medicaid Other

## 2019-08-14 DIAGNOSIS — Z1159 Encounter for screening for other viral diseases: Secondary | ICD-10-CM

## 2019-08-14 LAB — SARS CORONAVIRUS 2 (TAT 6-24 HRS): SARS Coronavirus 2: NEGATIVE

## 2019-08-16 ENCOUNTER — Other Ambulatory Visit: Payer: Self-pay

## 2019-08-16 ENCOUNTER — Encounter: Payer: Self-pay | Admitting: Gastroenterology

## 2019-08-16 ENCOUNTER — Ambulatory Visit (AMBULATORY_SURGERY_CENTER): Payer: Medicaid Other | Admitting: Gastroenterology

## 2019-08-16 VITALS — BP 156/91 | HR 101 | Temp 98.2°F | Resp 16 | Ht 67.0 in | Wt 316.6 lb

## 2019-08-16 DIAGNOSIS — D128 Benign neoplasm of rectum: Secondary | ICD-10-CM

## 2019-08-16 DIAGNOSIS — K621 Rectal polyp: Secondary | ICD-10-CM

## 2019-08-16 DIAGNOSIS — I10 Essential (primary) hypertension: Secondary | ICD-10-CM | POA: Diagnosis not present

## 2019-08-16 DIAGNOSIS — Z1211 Encounter for screening for malignant neoplasm of colon: Secondary | ICD-10-CM

## 2019-08-16 MED ORDER — SODIUM CHLORIDE 0.9 % IV SOLN: 500.0000 mL | Freq: Once | INTRAVENOUS | Status: DC

## 2019-08-16 NOTE — Progress Notes (Signed)
Called to room to assist during endoscopic procedure.  Patient ID and intended procedure confirmed with present staff. Received instructions for my participation in the procedure from the performing physician.  

## 2019-08-16 NOTE — Op Note (Signed)
[facility] Patient Name: Stacy Campos Procedure Date: 08/16/2019 10:27 AM MRN: [id number] Endoscopist: Thornton Park MD, MD Age: 49 Referring MD:  Date of Birth: 02-Jan-1971 Gender: Female Account #: [id number] Procedure:                Colonoscopy Indications:              Screening for colorectal malignant neoplasm, This                            is the patient's first colonoscopy                           No known family history of colon cancer or polyps Medicines:                Monitored Anesthesia Care Procedure:                Pre-Anesthesia Assessment:                           - Prior to the procedure, a History and Physical                            was performed, and patient medications and                            allergies were reviewed. The patient's tolerance of                            previous anesthesia was also reviewed. The risks                            and benefits of the procedure and the sedation                            options and risks were discussed with the patient.                            All questions were answered, and informed consent                            was obtained. Prior Anticoagulants: The patient has                            taken no previous anticoagulant or antiplatelet                            agents. ASA Grade Assessment: III - A patient with                            severe systemic disease. After reviewing the risks                            and benefits, the patient was deemed in  satisfactory condition to undergo the procedure.                           After obtaining informed consent, the colonoscope                            was passed under direct vision. Throughout the                            procedure, the patient's blood pressure, pulse, and                            oxygen saturations were monitored continuously. The                            Colonoscope was  introduced through the anus and                            advanced to the 3 cm into the ileum. A second                            forward view of the right colon was performed. The                            colonoscopy was performed without difficulty. The                            patient tolerated the procedure well. The quality                            of the bowel preparation was good. The terminal                            ileum, ileocecal valve, appendiceal orifice, and                            rectum were photographed. Scope In: 10:34:38 AM Scope Out: 10:46:01 AM Scope Withdrawal Time: 0 hours 8 minutes 4 seconds  Total Procedure Duration: 0 hours 11 minutes 23 seconds  Findings:                 The perianal and digital rectal examinations were                            normal.                           A 4 mm polyp was found in the rectum. The polyp was                            flat. The polyp was removed with a cold snare.                            Resection and retrieval were complete. Estimated  blood loss was minimal.                           Non-bleeding internal hemorrhoids were found during                            retroflexion. The hemorrhoids were small.                           The exam was otherwise without abnormality on                            direct and retroflexion views. Complications:            No immediate complications. Estimated blood loss:                            Minimal. Estimated Blood Loss:     Estimated blood loss was minimal. Impression:               - One 4 mm polyp in the rectum, removed with a cold                            snare. Resected and retrieved.                           - Non-bleeding internal hemorrhoids.                           - The examination was otherwise normal on direct                            and retroflexion views. Recommendation:           - Patient has a contact number  available for                            emergencies. The signs and symptoms of potential                            delayed complications were discussed with the                            patient. Return to normal activities tomorrow.                            Written discharge instructions were provided to the                            patient.                           - Resume previous diet.                           - Continue present medications.                           -  Await pathology results.                           - Repeat colonoscopy date to be determined after                            pending pathology results are reviewed for                            surveillance. Thornton Park MD, MD 08/16/2019 10:51:49 AM This report has been signed electronically.

## 2019-08-16 NOTE — Patient Instructions (Signed)
Handouts on hemorrhoids and polyps given to you today  Await pathology results   YOU HAD AN ENDOSCOPIC PROCEDURE TODAY AT [facility]:   Refer to the procedure report that was given to you for any specific questions about what was found during the examination.  If the procedure report does not answer your questions, please call your gastroenterologist to clarify.  If you requested that your care partner not be given the details of your procedure findings, then the procedure report has been included in a sealed envelope for you to review at your convenience later.  YOU SHOULD EXPECT: Some feelings of bloating in the abdomen. Passage of more gas than usual.  Walking can help get rid of the air that was put into your GI tract during the procedure and reduce the bloating. If you had a lower endoscopy (such as a colonoscopy or flexible sigmoidoscopy) you may notice spotting of blood in your stool or on the toilet paper. If you underwent a bowel prep for your procedure, you may not have a normal bowel movement for a few days.  Please Note:  You might notice some irritation and congestion in your nose or some drainage.  This is from the oxygen used during your procedure.  There is no need for concern and it should clear up in a day or so.  SYMPTOMS TO REPORT IMMEDIATELY:   Following lower endoscopy (colonoscopy or flexible sigmoidoscopy):  Excessive amounts of blood in the stool  Significant tenderness or worsening of abdominal pains  Swelling of the abdomen that is new, acute  Fever of 100F or higher  For urgent or emergent issues, a gastroenterologist can be reached at any hour by calling [phone].   DIET:  We do recommend a small meal at first, but then you may proceed to your regular diet.  Drink plenty of fluids but you should avoid alcoholic beverages for 24 hours.  ACTIVITY:  You should plan to take it easy for the rest of today and you should NOT DRIVE or use heavy  machinery until tomorrow (because of the sedation medicines used during the test).    FOLLOW UP: Our staff will call the number listed on your records 48-72 hours following your procedure to check on you and address any questions or concerns that you may have regarding the information given to you following your procedure. If we do not reach you, we will leave a message.  We will attempt to reach you two times.  During this call, we will ask if you have developed any symptoms of COVID 19. If you develop any symptoms (ie: fever, flu-like symptoms, shortness of breath, cough etc.) before then, please call [phone].  If you test positive for Covid 19 in the 2 weeks post procedure, please call and report this information to [place].    If any biopsies were taken you will be contacted by phone or by letter within the next 1-3 weeks.  Please call us at [phone] if you have not heard about the biopsies in 3 weeks.    SIGNATURES/CONFIDENTIALITY: You and/or your care partner have signed paperwork which will be entered into your electronic medical record.  These signatures attest to the fact that that the information above on your After Visit Summary has been reviewed and is understood.  Full responsibility of the confidentiality of this discharge information lies with you and/or your care-partner.

## 2019-08-16 NOTE — Progress Notes (Signed)
Report to PACU, RN, vss, BBS= Clear.  

## 2019-08-16 NOTE — Progress Notes (Signed)
VS- Nash Mantis Temperature- Lisa Clapps  Pt states she feels light headed upon arrival- states "I think it's from the prep."

## 2019-08-20 ENCOUNTER — Telehealth: Payer: Self-pay

## 2019-08-20 ENCOUNTER — Telehealth: Payer: Self-pay | Admitting: *Deleted

## 2019-08-20 NOTE — Telephone Encounter (Signed)
  Follow up Call-  Call back number 08/16/2019  Post procedure Call Back phone  # [phone]  Permission to leave phone message Yes  Some recent data might be hidden     Patient questions:  Mailbox is full.

## 2019-08-20 NOTE — Telephone Encounter (Signed)
Please call the patient. Pathology results from recent colonoscopy are still not back. If she is having symptoms, I recommend an office visit with me or an APP to provide a thoughtful review. She had an open access screening colonoscopy last week. Thanks.

## 2019-08-20 NOTE — Telephone Encounter (Signed)
Patient scheduled to see PG on 1/27 at 2 pm.

## 2019-08-20 NOTE — Telephone Encounter (Signed)
  Follow up Call-  Call back number 08/16/2019  Post procedure Call Back phone  # [phone]  Permission to leave phone message Yes  Some recent data might be hidden     Patient questions:  Do you have a fever, pain , or abdominal swelling? No. Pain Score  0 *  Have you tolerated food without any problems? Yes.    Have you been able to return to your normal activities? Yes.    Do you have any questions about your discharge instructions: Diet   No. Medications  No. Follow up visit  No.  Do you have questions or concerns about your Care? Yes.     Patient states she continues to have loose stools, greenish in color approximately 4 times daily since procedure. She has tried using Pepto bismol and tried one dose of Immodium yesterday without success. Told patient I will consult with Dr. Tarri Glenn for advice and staff will get back with her today. Message routed to Dr. Tarri Glenn.  Actions: * If pain score is 4 or above: No action needed, pain <4. 1. Have you developed a fever since your procedure? no  2.   Have you had an respiratory symptoms (SOB or cough) since your procedure? no  3.   Have you tested positive for COVID 19 since your procedure no  4.   Have you had any family members/close contacts diagnosed with the COVID 19 since your procedure?  no   If yes to any of these questions please route to Joylene John, RN and Alphonsa Gin, Therapist, sports.

## 2019-08-21 ENCOUNTER — Other Ambulatory Visit: Payer: Medicaid Other

## 2019-08-21 ENCOUNTER — Encounter: Payer: Self-pay | Admitting: Nurse Practitioner

## 2019-08-21 ENCOUNTER — Ambulatory Visit (INDEPENDENT_AMBULATORY_CARE_PROVIDER_SITE_OTHER): Payer: Medicaid Other | Admitting: Nurse Practitioner

## 2019-08-21 ENCOUNTER — Encounter: Payer: Self-pay | Admitting: Gastroenterology

## 2019-08-21 VITALS — BP 152/100 | HR 95 | Temp 98.3°F | Ht 67.0 in | Wt 315.0 lb

## 2019-08-21 DIAGNOSIS — R197 Diarrhea, unspecified: Secondary | ICD-10-CM | POA: Diagnosis not present
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
## 2019-08-21 NOTE — Patient Instructions (Signed)
If you are age 51 or older, your body mass index should be between 23-30. Your Body mass index is 49.34 kg/m. If this is out of the aforementioned range listed, please consider follow up with your Primary Care Provider.  If you are age 42 or younger, your body mass index should be between 19-25. Your Body mass index is 49.34 kg/m. If this is out of the aformentioned range listed, please consider follow up with your Primary Care Provider.   Your provider has requested that you go to the basement level for lab work before leaving today. Press "B" on the elevator. The lab is located at the first door on the left as you exit the elevator. C Diff  We will call you with results.  Thank you for choosing me and [facility].   Tye Savoy, NP

## 2019-08-22 NOTE — Progress Notes (Signed)
Reviewed and agree with management plans. ? ?Gilles Trimpe L. Paulena Servais, MD, MPH  ?

## 2019-08-23 NOTE — Progress Notes (Signed)
Received call back from Dr Levell July office. scheduler Elmo Putt to contact patient and have patient call pre-op nurse back.

## 2019-08-23 NOTE — Progress Notes (Signed)
Received another call from [place] at Dr Levell July office. Elmo Putt reports that she has personally tried to contact patient and was also unable to get in touch with patient or her daughter. Elmo Putt aware that patient must have covid test on 08/27/2019 for surgery on 08/30/2019. Elmo Putt asks that we f/u with their office on Monday 08/26/2019 if patient has still not been in contact with [facility] pre-op dept.

## 2019-08-23 NOTE — Progress Notes (Addendum)
Unable to reach patient to complete pre-op call for upcoming surgery on 2/5 after multiple attempts. Patient voicemail box is full. RN called number listed for patient's daughter Francisca December [phone] and lvmm but no call back at this time. Contacted Dr Levell July office and lvmm for scheduler Elmo Putt to make aware

## 2019-08-26 ENCOUNTER — Ambulatory Visit
Admission: RE | Admit: 2019-08-26 | Discharge: 2019-08-26 | Disposition: A | Discharge status: Home / Self Care | Payer: Medicaid Other | Source: Ambulatory Visit | Attending: Family Medicine | Admitting: Family Medicine

## 2019-08-26 ENCOUNTER — Other Ambulatory Visit: Payer: Self-pay

## 2019-08-26 DIAGNOSIS — Z1231 Encounter for screening mammogram for malignant neoplasm of breast: Secondary | ICD-10-CM

## 2019-08-26 IMAGING — MG DIGITAL SCREENING BILAT W/ CAD
8 of 9 series · 8 of 9 positions shown · non-contrast
Comparison: Previous exam(s).

ACR Breast Density Category a: The breast tissue is almost entirely
fatty.

CLINICAL DATA: Screening.

EXAM:
DIGITAL SCREENING BILATERAL MAMMOGRAM WITH CAD

[R MLO (1 of 2)]
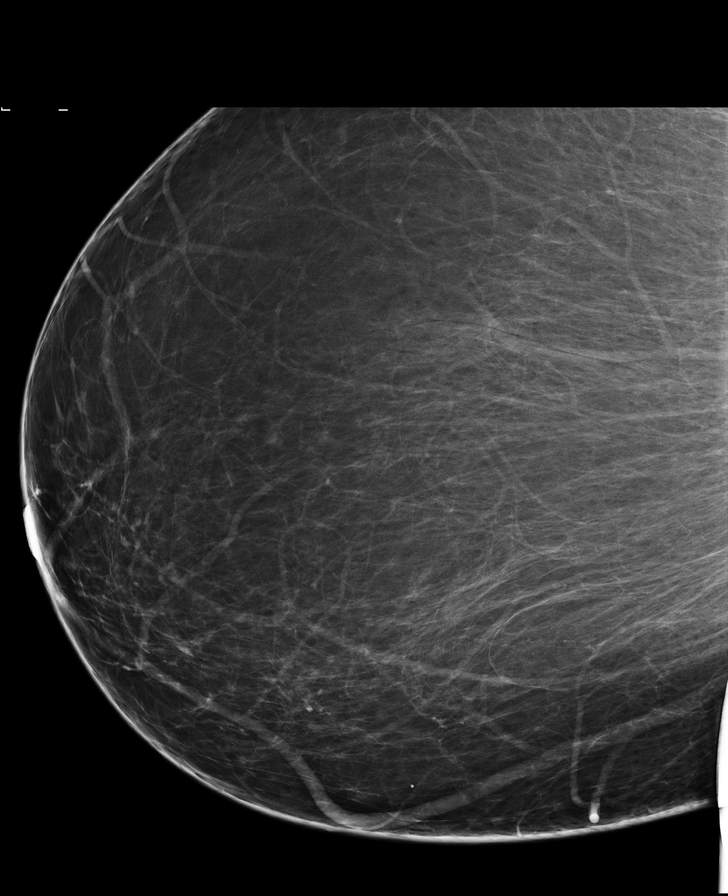

[L CV]
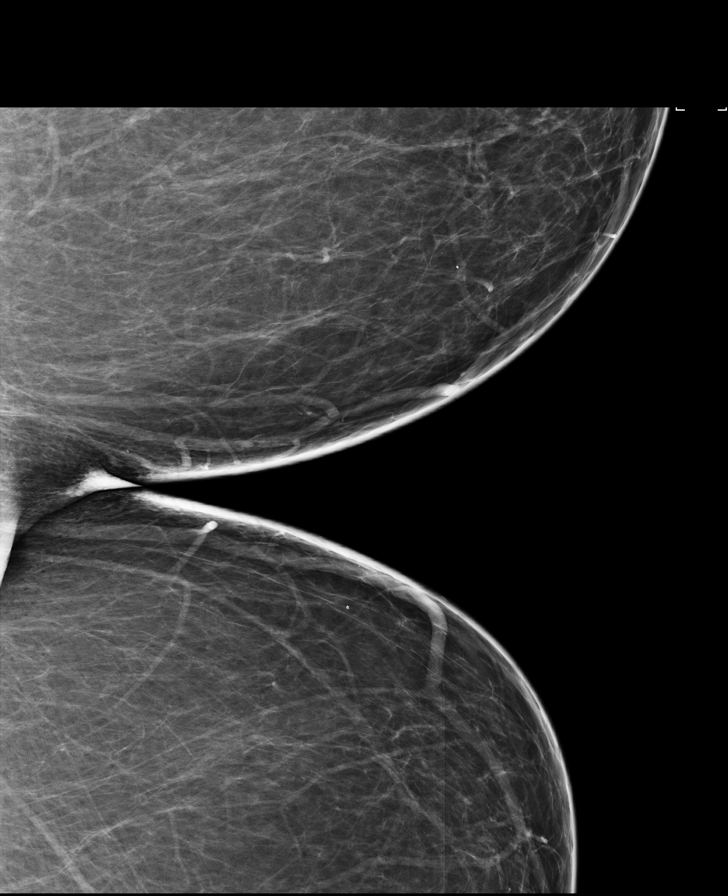

[L CC (1 of 2)]
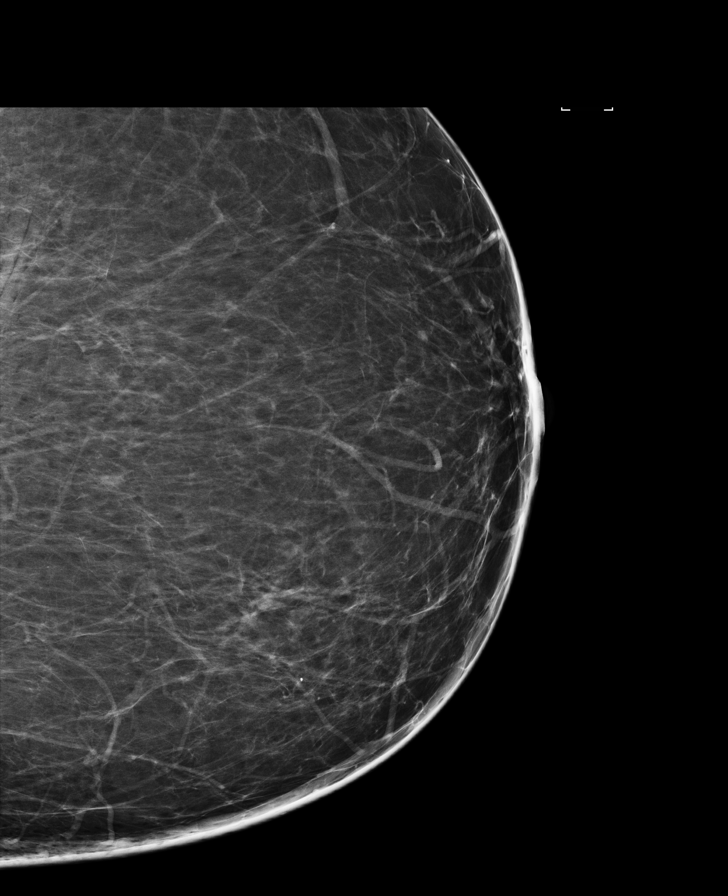

[R CC]
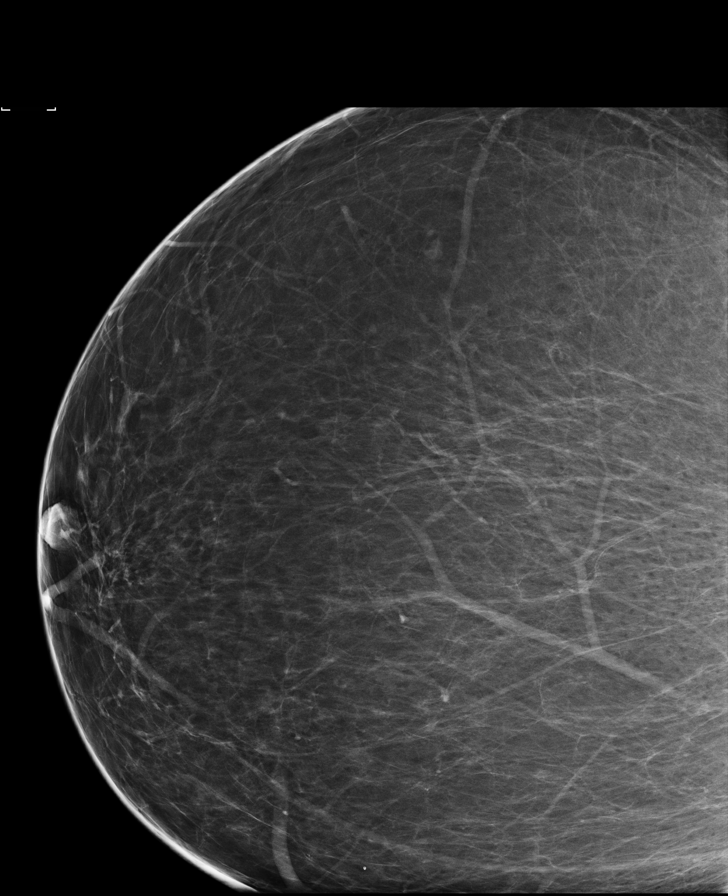

[R MLO (2 of 2)]
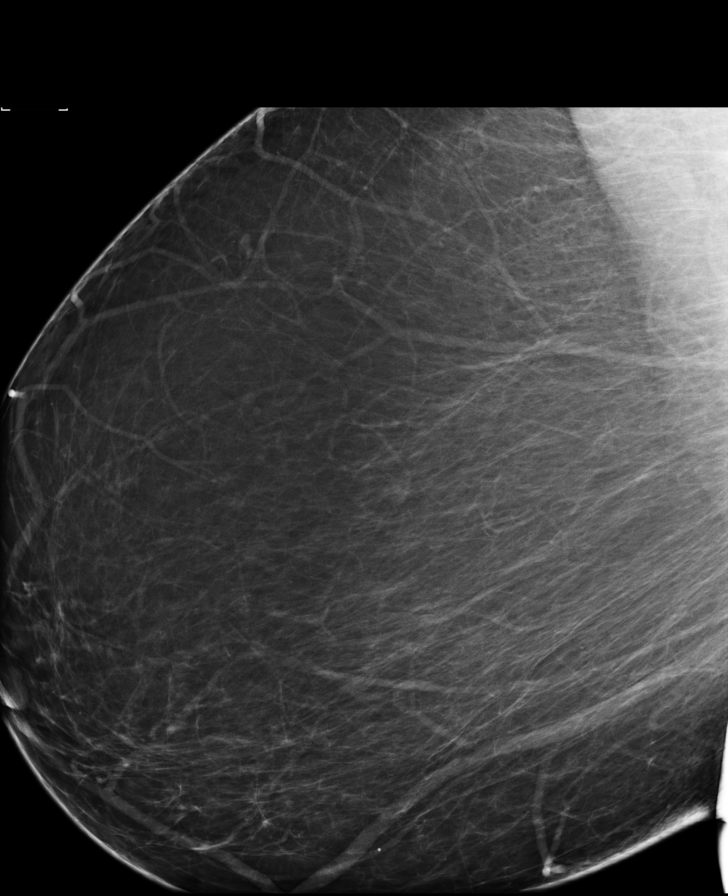

[L MLO (1 of 2)]
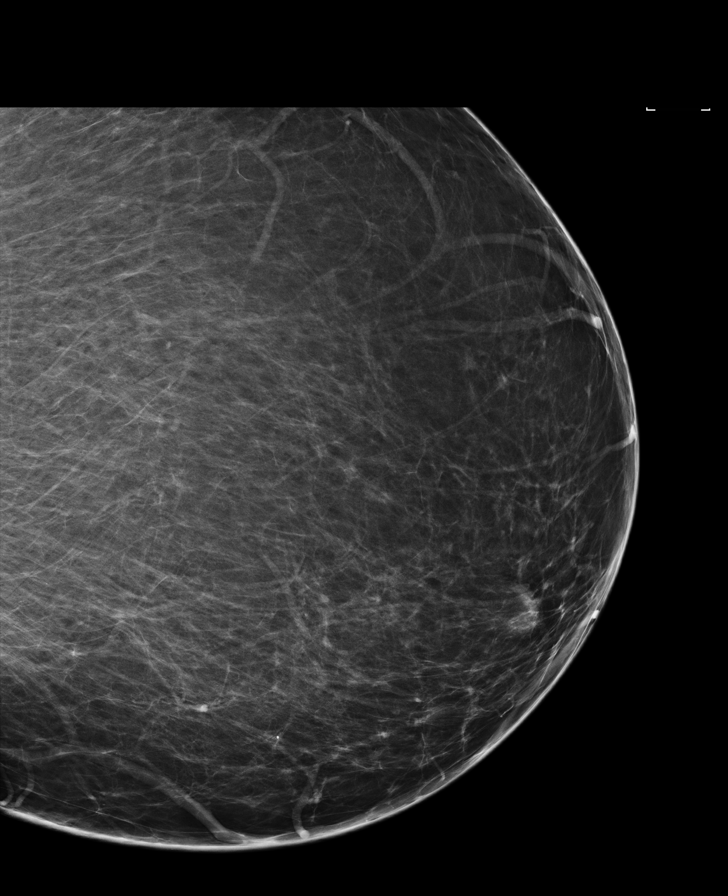

[L MLO (2 of 2)]
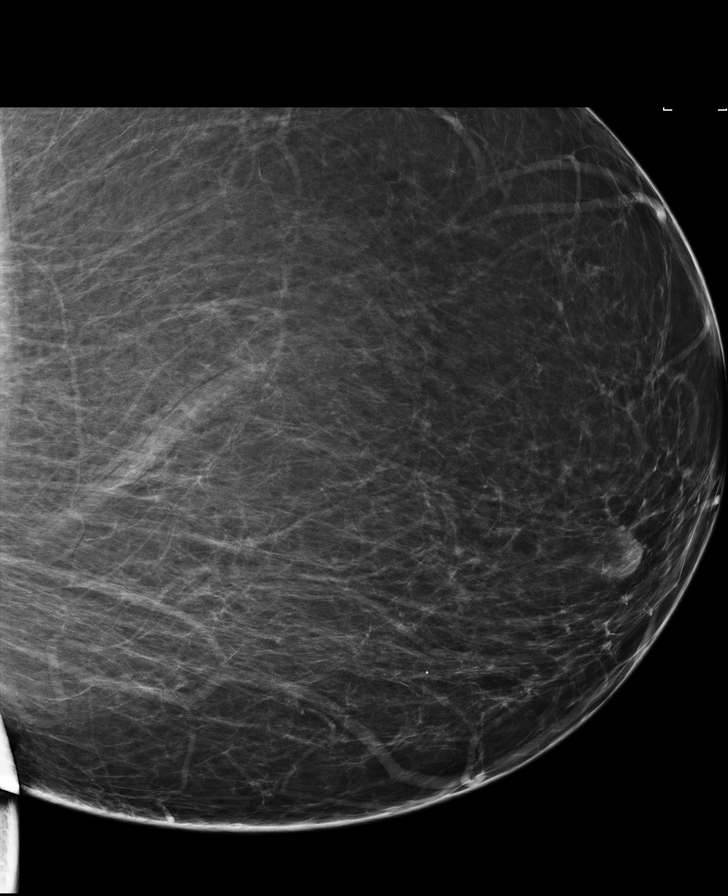

[L CC (2 of 2)]
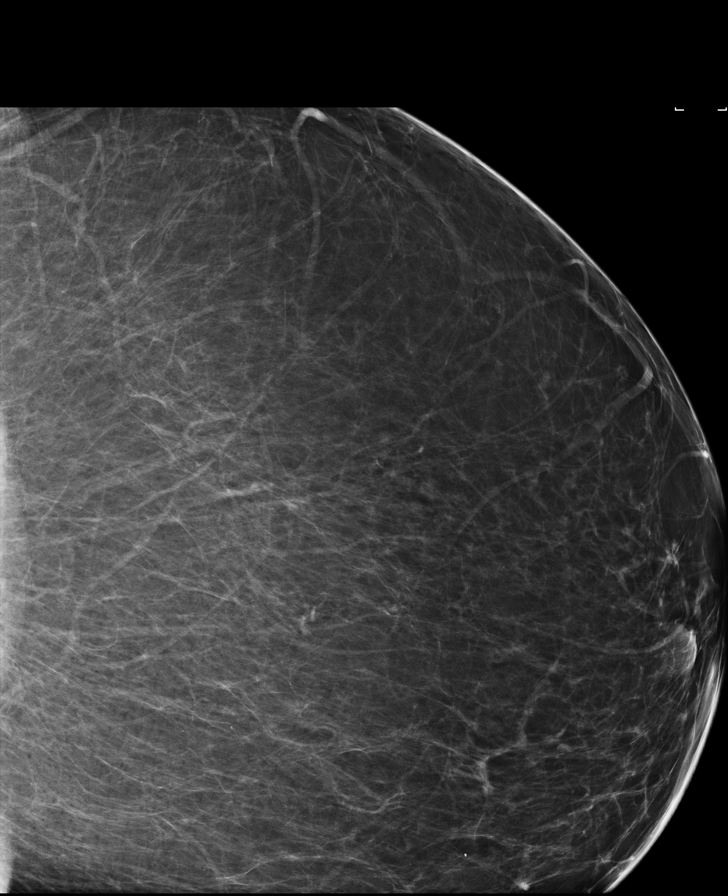

[8 of 9 positions shown; findings below may reference images not displayed]

FINDINGS: There are no findings suspicious for malignancy. Images were
processed with CAD.
IMPRESSION: No mammographic evidence of malignancy. A result letter of this
screening mammogram will be mailed directly to the patient.

RECOMMENDATION:
Screening mammogram in one year. (Code:[0V])

BI-RADS CATEGORY  1: Negative.

## 2019-08-27 ENCOUNTER — Encounter (HOSPITAL_BASED_OUTPATIENT_CLINIC_OR_DEPARTMENT_OTHER)
Admission: RE | Admit: 2019-08-27 | Discharge: 2019-08-27 | Disposition: A | Discharge status: Home / Self Care | Payer: Medicaid Other | Source: Ambulatory Visit | Attending: Orthopedic Surgery | Admitting: Orthopedic Surgery

## 2019-08-27 ENCOUNTER — Encounter (HOSPITAL_BASED_OUTPATIENT_CLINIC_OR_DEPARTMENT_OTHER): Payer: Self-pay | Admitting: Orthopedic Surgery

## 2019-08-27 ENCOUNTER — Other Ambulatory Visit: Payer: Self-pay

## 2019-08-27 ENCOUNTER — Other Ambulatory Visit (HOSPITAL_COMMUNITY): Admission: RE | Admit: 2019-08-27 | Payer: Medicaid Other | Source: Ambulatory Visit

## 2019-08-27 DIAGNOSIS — Z01812 Encounter for preprocedural laboratory examination: Secondary | ICD-10-CM | POA: Diagnosis not present

## 2019-08-27 LAB — BASIC METABOLIC PANEL
Anion gap: 8 (ref 5–15)
BUN: 6 mg/dL (ref 6–20)
CO2: 26 mmol/L (ref 22–32)
Calcium: 8.5 mg/dL — ABNORMAL LOW (ref 8.9–10.3)
Chloride: 103 mmol/L (ref 98–111)
Creatinine, Ser: 0.77 mg/dL (ref 0.44–1.00)
GFR calc Af Amer: >60 mL/min (ref >60)
GFR calc non Af Amer: >60 mL/min (ref >60)
Glucose, Bld: 117 mg/dL — ABNORMAL HIGH (ref 70–99)
Potassium: 3.7 mmol/L (ref 3.5–5.1)
Sodium: 137 mmol/L (ref 135–145)

## 2019-08-27 NOTE — Progress Notes (Signed)
Anesthesia consult per Dr. Doroteo Glassman, will proceed with surgery as scheduled.

## 2019-08-27 NOTE — Progress Notes (Signed)

## 2019-08-28 ENCOUNTER — Other Ambulatory Visit (HOSPITAL_COMMUNITY)
Admission: RE | Admit: 2019-08-28 | Discharge: 2019-08-28 | Disposition: A | Discharge status: Home / Self Care | Payer: Medicaid Other | Source: Ambulatory Visit | Attending: Orthopedic Surgery | Admitting: Orthopedic Surgery

## 2019-08-28 DIAGNOSIS — Z01812 Encounter for preprocedural laboratory examination: Secondary | ICD-10-CM | POA: Diagnosis present

## 2019-08-28 DIAGNOSIS — Z20822 Contact with and (suspected) exposure to covid-19: Secondary | ICD-10-CM | POA: Insufficient documentation

## 2019-08-28 LAB — SARS CORONAVIRUS 2 (TAT 6-24 HRS): SARS Coronavirus 2: NEGATIVE

## 2019-08-30 ENCOUNTER — Encounter (HOSPITAL_BASED_OUTPATIENT_CLINIC_OR_DEPARTMENT_OTHER): Payer: Self-pay | Admitting: Orthopedic Surgery

## 2019-08-30 ENCOUNTER — Other Ambulatory Visit: Payer: Self-pay

## 2019-08-30 ENCOUNTER — Ambulatory Visit (HOSPITAL_BASED_OUTPATIENT_CLINIC_OR_DEPARTMENT_OTHER)
Admission: RE | Admit: 2019-08-30 | Discharge: 2019-08-30 | Disposition: A | Discharge status: Home / Self Care | Payer: Medicaid Other | Source: Ambulatory Visit | Attending: Orthopedic Surgery | Admitting: Orthopedic Surgery

## 2019-08-30 ENCOUNTER — Ambulatory Visit (HOSPITAL_BASED_OUTPATIENT_CLINIC_OR_DEPARTMENT_OTHER): Payer: Medicaid Other | Admitting: Certified Registered"

## 2019-08-30 ENCOUNTER — Encounter (HOSPITAL_BASED_OUTPATIENT_CLINIC_OR_DEPARTMENT_OTHER)
Admission: RE | Disposition: A | Discharge status: Home / Self Care | Payer: Self-pay | Source: Ambulatory Visit | Attending: Orthopedic Surgery

## 2019-08-30 DIAGNOSIS — Z888 Allergy status to other drugs, medicaments and biological substances status: Secondary | ICD-10-CM | POA: Insufficient documentation

## 2019-08-30 DIAGNOSIS — F419 Anxiety disorder, unspecified: Secondary | ICD-10-CM | POA: Insufficient documentation

## 2019-08-30 DIAGNOSIS — Z6841 Body Mass Index (BMI) 40.0 and over, adult: Secondary | ICD-10-CM | POA: Diagnosis not present

## 2019-08-30 DIAGNOSIS — J1282 Pneumonia due to coronavirus disease 2019: Secondary | ICD-10-CM | POA: Diagnosis not present

## 2019-08-30 DIAGNOSIS — M65312 Trigger thumb, left thumb: Secondary | ICD-10-CM | POA: Diagnosis not present

## 2019-08-30 DIAGNOSIS — M419 Scoliosis, unspecified: Secondary | ICD-10-CM | POA: Diagnosis not present

## 2019-08-30 DIAGNOSIS — U071 COVID-19: Secondary | ICD-10-CM | POA: Diagnosis not present

## 2019-08-30 DIAGNOSIS — M1990 Unspecified osteoarthritis, unspecified site: Secondary | ICD-10-CM | POA: Diagnosis not present

## 2019-08-30 DIAGNOSIS — M65842 Other synovitis and tenosynovitis, left hand: Secondary | ICD-10-CM | POA: Insufficient documentation

## 2019-08-30 DIAGNOSIS — Z8261 Family history of arthritis: Secondary | ICD-10-CM | POA: Diagnosis not present

## 2019-08-30 DIAGNOSIS — Z79899 Other long term (current) drug therapy: Secondary | ICD-10-CM | POA: Insufficient documentation

## 2019-08-30 DIAGNOSIS — G5602 Carpal tunnel syndrome, left upper limb: Secondary | ICD-10-CM | POA: Insufficient documentation

## 2019-08-30 DIAGNOSIS — I10 Essential (primary) hypertension: Secondary | ICD-10-CM | POA: Insufficient documentation

## 2019-08-30 HISTORY — PX: CARPAL TUNNEL RELEASE: SHX101

## 2019-08-30 HISTORY — DX: Pneumonia, unspecified organism: J18.9

## 2019-08-30 HISTORY — PX: TRIGGER FINGER RELEASE: SHX641

## 2019-08-30 SURGERY — CARPAL TUNNEL RELEASE
Anesthesia: Regional | Site: Thumb | Laterality: Left

## 2019-08-30 MED ORDER — FENTANYL CITRATE (PF) 100 MCG/2ML IJ SOLN
INTRAMUSCULAR | Status: AC
  Filled 2019-08-30: qty 2

## 2019-08-30 MED ORDER — PROMETHAZINE HCL 25 MG/ML IJ SOLN: 6.2500 mg | INTRAMUSCULAR | Status: DC | PRN

## 2019-08-30 MED ORDER — TRAMADOL HCL 50 MG PO TABS: 50.0000 mg | ORAL_TABLET | Freq: Four times a day (QID) | ORAL | 0 refills | Status: DC | PRN

## 2019-08-30 MED ORDER — FENTANYL CITRATE (PF) 100 MCG/2ML IJ SOLN: 25.0000 ug | INTRAMUSCULAR | Status: DC | PRN

## 2019-08-30 MED ORDER — OXYCODONE HCL 5 MG/5ML PO SOLN: 5.0000 mg | Freq: Once | ORAL | Status: AC | PRN

## 2019-08-30 MED ORDER — MIDAZOLAM HCL 2 MG/2ML IJ SOLN
1.0000 mg | INTRAMUSCULAR | Status: DC | PRN
  Administered 2019-08-30: 2 mg via INTRAVENOUS

## 2019-08-30 MED ORDER — BUPIVACAINE HCL (PF) 0.25 % IJ SOLN
INTRAMUSCULAR | Status: DC | PRN
  Administered 2019-08-30: 9 mL

## 2019-08-30 MED ORDER — CEFAZOLIN SODIUM-DEXTROSE 2-4 GM/100ML-% IV SOLN
2.0000 g | INTRAVENOUS | Status: AC
  Administered 2019-08-30: 3 g via INTRAVENOUS

## 2019-08-30 MED ORDER — ACETAMINOPHEN 500 MG PO TABS
ORAL_TABLET | ORAL | Status: AC
  Filled 2019-08-30: qty 2

## 2019-08-30 MED ORDER — PROPOFOL 500 MG/50ML IV EMUL
INTRAVENOUS | Status: AC
  Filled 2019-08-30: qty 100

## 2019-08-30 MED ORDER — CHLORHEXIDINE GLUCONATE 4 % EX LIQD: 60.0000 mL | Freq: Once | CUTANEOUS | Status: DC

## 2019-08-30 MED ORDER — MIDAZOLAM HCL 2 MG/2ML IJ SOLN
INTRAMUSCULAR | Status: AC
  Filled 2019-08-30: qty 2

## 2019-08-30 MED ORDER — OXYCODONE HCL 5 MG PO TABS
5.0000 mg | ORAL_TABLET | Freq: Once | ORAL | Status: AC | PRN
  Administered 2019-08-30: 14:00:00 5 mg via ORAL

## 2019-08-30 MED ORDER — LACTATED RINGERS IV SOLN: INTRAVENOUS | Status: DC

## 2019-08-30 MED ORDER — PROPOFOL 10 MG/ML IV BOLUS
INTRAVENOUS | Status: DC | PRN
  Administered 2019-08-30: 10 ug via INTRAVENOUS
  Administered 2019-08-30: 20 mg via INTRAVENOUS
  Administered 2019-08-30: 10 ug via INTRAVENOUS
  Administered 2019-08-30 (×3): 20 mg via INTRAVENOUS
  Administered 2019-08-30: 10 mg via INTRAVENOUS
  Administered 2019-08-30 (×3): 20 mg via INTRAVENOUS
  Administered 2019-08-30 (×2): 10 ug via INTRAVENOUS
  Administered 2019-08-30 (×2): 20 mg via INTRAVENOUS

## 2019-08-30 MED ORDER — OXYCODONE HCL 5 MG PO TABS
ORAL_TABLET | ORAL | Status: AC
  Filled 2019-08-30: qty 1

## 2019-08-30 MED ORDER — CEFAZOLIN SODIUM-DEXTROSE 2-4 GM/100ML-% IV SOLN
INTRAVENOUS | Status: AC
  Filled 2019-08-30: qty 100

## 2019-08-30 MED ORDER — CEFAZOLIN SODIUM 1 G IJ SOLR
INTRAMUSCULAR | Status: AC
  Filled 2019-08-30: qty 10

## 2019-08-30 MED ORDER — ACETAMINOPHEN 500 MG PO TABS
1000.0000 mg | ORAL_TABLET | Freq: Once | ORAL | Status: AC
  Administered 2019-08-30: 13:00:00 1000 mg via ORAL

## 2019-08-30 MED ORDER — FENTANYL CITRATE (PF) 100 MCG/2ML IJ SOLN
50.0000 ug | INTRAMUSCULAR | Status: DC | PRN
  Administered 2019-08-30 (×2): 50 ug via INTRAVENOUS

## 2019-08-30 SURGICAL SUPPLY — 41 items
APL PRP STRL LF DISP 70% ISPRP (MISCELLANEOUS) ×2
BLADE SURG 15 STRL LF DISP TIS (BLADE) ×2 IMPLANT
BLADE SURG 15 STRL SS (BLADE) ×4
BNDG CMPR 9X4 STRL LF SNTH (GAUZE/BANDAGES/DRESSINGS)
BNDG COHESIVE 2X5 TAN STRL LF (GAUZE/BANDAGES/DRESSINGS) ×4 IMPLANT
BNDG COHESIVE 3X5 TAN STRL LF (GAUZE/BANDAGES/DRESSINGS) ×1 IMPLANT
BNDG ESMARK 4X9 LF (GAUZE/BANDAGES/DRESSINGS) IMPLANT
BNDG GAUZE ELAST 4 BULKY (GAUZE/BANDAGES/DRESSINGS) ×4 IMPLANT
CHLORAPREP W/TINT 26 (MISCELLANEOUS) ×4 IMPLANT
CORD BIPOLAR FORCEPS 12FT (ELECTRODE) ×4 IMPLANT
COVER BACK TABLE 60X90IN (DRAPES) ×4 IMPLANT
COVER MAYO STAND STRL (DRAPES) ×4 IMPLANT
COVER WAND RF STERILE (DRAPES) IMPLANT
CUFF TOURN SGL QUICK 18X4 (TOURNIQUET CUFF) ×4 IMPLANT
DECANTER SPIKE VIAL GLASS SM (MISCELLANEOUS) IMPLANT
DRAPE EXTREMITY T 121X128X90 (DISPOSABLE) ×4 IMPLANT
DRAPE SURG 17X23 STRL (DRAPES) ×4 IMPLANT
DRSG PAD ABDOMINAL 8X10 ST (GAUZE/BANDAGES/DRESSINGS) ×4 IMPLANT
GAUZE SPONGE 4X4 12PLY STRL (GAUZE/BANDAGES/DRESSINGS) ×4 IMPLANT
GAUZE XEROFORM 1X8 LF (GAUZE/BANDAGES/DRESSINGS) ×4 IMPLANT
GLOVE BIOGEL PI IND STRL 7.0 (GLOVE) ×1 IMPLANT
GLOVE BIOGEL PI IND STRL 8.5 (GLOVE) ×2 IMPLANT
GLOVE BIOGEL PI INDICATOR 7.0 (GLOVE) ×2
GLOVE BIOGEL PI INDICATOR 8.5 (GLOVE) ×2
GLOVE ECLIPSE 6.5 STRL STRAW (GLOVE) ×2 IMPLANT
GLOVE EXAM NITRILE MD LF STRL (GLOVE) ×3 IMPLANT
GLOVE SURG ORTHO 8.0 STRL STRW (GLOVE) ×4 IMPLANT
GOWN STRL REUS W/ TWL LRG LVL3 (GOWN DISPOSABLE) ×2 IMPLANT
GOWN STRL REUS W/TWL LRG LVL3 (GOWN DISPOSABLE) ×4
GOWN STRL REUS W/TWL XL LVL3 (GOWN DISPOSABLE) ×4 IMPLANT
NDL PRECISIONGLIDE 27X1.5 (NEEDLE) ×2 IMPLANT
NEEDLE PRECISIONGLIDE 27X1.5 (NEEDLE) ×4 IMPLANT
NS IRRIG 1000ML POUR BTL (IV SOLUTION) ×4 IMPLANT
PACK BASIN DAY SURGERY FS (CUSTOM PROCEDURE TRAY) ×4 IMPLANT
STOCKINETTE 4X48 STRL (DRAPES) ×4 IMPLANT
SUT ETHILON 4 0 PS 2 18 (SUTURE) ×4 IMPLANT
SUT VICRYL 4-0 PS2 18IN ABS (SUTURE) IMPLANT
SYR BULB 3OZ (MISCELLANEOUS) ×4 IMPLANT
SYR CONTROL 10ML LL (SYRINGE) ×4 IMPLANT
TOWEL GREEN STERILE FF (TOWEL DISPOSABLE) ×8 IMPLANT
UNDERPAD 30X36 HEAVY ABSORB (UNDERPADS AND DIAPERS) ×4 IMPLANT

## 2019-08-30 NOTE — Discharge Instructions (Signed)
No Tylenol until 6:33pm 5mg  oxycodone given at 2:30pm, next dose of tramadol may be taken at 8:30pm   [facility] Instructions Hand Surgery  Wound Care: Keep your hand elevated above the level of your heart.  Do not allow it to dangle by your side.  Keep the dressing dry and do not remove it unless your doctor advises you to do so.  He will usually change it at the time of your post-op visit.  Moving your fingers is advised to stimulate circulation but will depend on the site of your surgery.  If you have a splint applied, your doctor will advise you regarding movement.  Activity: Do not drive or operate machinery today.  Rest today and then you may return to your normal activity and work as indicated by your physician.  Diet:  Drink liquids today or eat a light diet.  You may resume a regular diet tomorrow.    General expectations: Pain for two to three days. Fingers may become slightly swollen.  Call your doctor if any of the following occur: Severe pain not relieved by pain medication. Elevated temperature. Dressing soaked with blood. Inability to move fingers. White or bluish color to fingers.   Post Anesthesia Home Care Instructions  Activity: Get plenty of rest for the remainder of the day. A responsible individual must stay with you for 24 hours following the procedure.  For the next 24 hours, DO NOT: -Drive a car -Paediatric nurse -Drink alcoholic beverages -Take any medication unless instructed by your physician -Make any legal decisions or sign important papers.  Meals: Start with liquid foods such as gelatin or soup. Progress to regular foods as tolerated. Avoid greasy, spicy, heavy foods. If nausea and/or vomiting occur, drink only clear liquids until the nausea and/or vomiting subsides. Call your physician if vomiting continues.  Special Instructions/Symptoms: Your throat may feel dry or sore from the anesthesia or the breathing tube placed in your throat  during surgery. If this causes discomfort, gargle with warm salt water. The discomfort should disappear within 24 hours.  If you had a scopolamine patch placed behind your ear for the management of post- operative nausea and/or vomiting:  1. The medication in the patch is effective for 72 hours, after which it should be removed.  Wrap patch in a tissue and discard in the trash. Wash hands thoroughly with soap and water. 2. You may remove the patch earlier than 72 hours if you experience unpleasant side effects which may include dry mouth, dizziness or visual disturbances. 3. Avoid touching the patch. Wash your hands with soap and water after contact with the patch.   Regional Anesthesia Blocks  1. Numbness or the inability to move the "blocked" extremity may last from 3-48 hours after placement. The length of time depends on the medication injected and your individual response to the medication. If the numbness is not going away after 48 hours, call your surgeon.  2. The extremity that is blocked will need to be protected until the numbness is gone and the  Strength has returned. Because you cannot feel it, you will need to take extra care to avoid injury. Because it may be weak, you may have difficulty moving it or using it. You may not know what position it is in without looking at it while the block is in effect.  3. For blocks in the legs and feet, returning to weight bearing and walking needs to be done carefully. You will need to wait  until the numbness is entirely gone and the strength has returned. You should be able to move your leg and foot normally before you try and bear weight or walk. You will need someone to be with you when you first try to ensure you do not fall and possibly risk injury.  4. Bruising and tenderness at the needle site are common side effects and will resolve in a few days.  5. Persistent numbness or new problems with movement should be communicated to the surgeon  or the [facility] [phone] [place] [phone]).

## 2019-08-30 NOTE — Anesthesia Procedure Notes (Signed)
Anesthesia Regional Block: Bier block (IV Regional)   Pre-Anesthetic Checklist: ,, timeout performed, Correct Patient, Correct Site, Correct Laterality, Correct Procedure,, site marked, surgical consent,, at surgeon's request  Laterality: Left     Needles:  Injection technique: Single-shot  Needle Type: Other      Needle Gauge: 22     Additional Needles:   Procedures:,,,,, intact distal pulses, Esmarch exsanguination, single tourniquet utilized,  Narrative:  Start time: 08/30/2019 1:06 PM End time: 08/30/2019 1:07 PM Injection made incrementally with aspirations every 35 mL.  Performed by: Personally   Additional Notes: Esmark wrap, torq inflated, ned pulse, slowly injected 35 ml 0.5% pres free lido. Pt tol well no neg symptoms

## 2019-08-30 NOTE — Transfer of Care (Signed)
Immediate Anesthesia Transfer of Care Note  Patient: Stacy Campos  Procedure(s) Performed: LEFT CARPAL TUNNEL RELEASE (Left Hand) LEFT THUMB RELEASE TRIGGER FINGER/A-1 PULLEY (Left Thumb)  Patient Location: PACU  Anesthesia Type:MAC and Bier block  Level of Consciousness: awake, alert  and oriented  Airway & Oxygen Therapy: Patient Spontanous Breathing and Patient connected to face mask oxygen  Post-op Assessment: Report given to RN and Post -op Vital signs reviewed and stable  Post vital signs: Reviewed and stable  Last Vitals:  Vitals Value Taken Time  BP    Temp    Pulse 92 08/30/19 1346  Resp    SpO2 100 % 08/30/19 1346  Vitals shown include unvalidated device data.  Last Pain:  Vitals:   08/30/19 1154  TempSrc: Oral  PainSc: 0-No pain         Complications: No apparent anesthesia complications

## 2019-08-30 NOTE — Op Note (Signed)
NAME: Stacy Campos MEDICAL RECORD NO: [id number] DATE OF BIRTH: 15-Sep-1970 FACILITY: [facility] LOCATION: [facility] PHYSICIAN: Wynonia Sours, MD   OPERATIVE REPORT   DATE OF PROCEDURE: 08/30/19    PREOPERATIVE DIAGNOSIS:   Stenosing tenosynovitis left thumb and carpal tunnel syndrome left hand   POSTOPERATIVE DIAGNOSIS:   Same   PROCEDURE:   Decompression median nerve left hand with release A1 pulley left thumb   SURGEON: Daryll Brod, M.D.   ASSISTANT: none   ANESTHESIA:  Bier block with sedation and Local   INTRAVENOUS FLUIDS:  Per anesthesia flow sheet.   ESTIMATED BLOOD LOSS:  Minimal.   COMPLICATIONS:  None.   SPECIMENS:  none   TOURNIQUET TIME:    Total Tourniquet Time Documented: Forearm (Left) - 33 minutes Total: Forearm (Left) - 33 minutes    DISPOSITION:  Stable to PACU.   INDICATIONS: Patient is a 49 year old female with a history of numbness and tingling of her bilateral hands and catching of her left thumb which has not responded to conservative treatment nerve conductions are positive for carpal tunnel syndrome bilaterally.  She has elected undergo surgical decompression to the median nerve and release of the A1 pulley of the left thumb.  Preperi-and postoperative course been discussed along with risks and complications.  She is aware that there is no guarantee to the surgery the possibility of infection recurrence injury to arteries nerves tendons complete relief of symptoms and dystrophy.  Preoperative area the patient seen the extremity marked with both patient and surgeon antibiotic given  OPERATIVE COURSE: Patient is brought to the operating room where form based IV regional anesthetic was carried out without difficulty under the direction of the anesthesia department.  She was prepped using ChloraPrep and in supine position with the left arm free.  A 3-minute dry time was allowed and a timeout taken confirming patient procedure.  The thumb was  approached first.  A transverse incision was made over the A1 pulley of the left thumb carried down through subcutaneous tissue.  Bleeders were electrocauterized with bipolar as necessary.  Neurovascular bundles radially and ulnarly were identified protected the A1 pulley was found to be markedly thickened this was released on its radial aspect.  The tenosynovial tissue proximally was separated with blunt dissection.  The thumb was placed full passive range of motion no further triggering was noted.  The oblique pulley was left intact.  The wound was irrigated and closed with interrupted 4-0 nylon sutures.  A longitudinal incision was made left palm carried down through subcutaneous tissue.  Bleeders were electrocauterized with bipolar.  The palmar fascia was split retractors placed in the superficial palmar arch identified distally.  Flexor tendon the ring little fingers were identified retractors were placed protecting the median nerve radially ulnar nerve ulnarly and the flexor retinaculum was then released on its ulnar border.  Right angle and stool retractor were then placed between skin and forearm fascia.  The deep structures were dissected free with blunt dissection.  The proximal aspect of the flexor retinaculum distal forearm fascia was then released under direct vision to approximately 2 cm proximal to the wrist crease under direct vision.  The canal was explored.  An area compression of the nerve was apparent.  Motor branch entered muscle distally.  No further lesions were identified.  The wound was copiously irrigated with saline.  Skin was closed interrupted 4-0 nylon sutures.  Local infiltration quarter percent bupivacaine without epinephrine was given approximately 9 total  cc were used.  A sterile compressive dressing with fingers 3 was applied.  Deflation of the tourniquet all fingers immediately pink.  She was taken to the recovery room for observation in satisfactory condition.  She will be  discharged home to return to the hand center [facility] in 1 week on Tylenol ibuprofen for pain with Ultram for breakthrough.   Daryll Brod, MD Electronically signed, 08/30/19

## 2019-08-30 NOTE — Anesthesia Postprocedure Evaluation (Signed)
Anesthesia Post Note  Patient: Stacy Campos  Procedure(s) Performed: LEFT CARPAL TUNNEL RELEASE (Left Hand) LEFT THUMB RELEASE TRIGGER FINGER/A-1 PULLEY (Left Thumb)     Patient location during evaluation: PACU Anesthesia Type: Bier Block Level of consciousness: awake and alert and oriented Pain management: pain level controlled Vital Signs Assessment: post-procedure vital signs reviewed and stable Respiratory status: spontaneous breathing, nonlabored ventilation and respiratory function stable Cardiovascular status: blood pressure returned to baseline Postop Assessment: no apparent nausea or vomiting Anesthetic complications: no    Last Vitals:  Vitals:   08/30/19 1346 08/30/19 1400  BP: (!) 159/97 (!) 165/94  Pulse: 92 84  Resp: 20 15  Temp: 36.6 C   SpO2: 100% 97%    Last Pain:  Vitals:   08/30/19 1400  TempSrc:   PainSc: [place]

## 2019-08-30 NOTE — Anesthesia Preprocedure Evaluation (Addendum)
Anesthesia Evaluation  Patient identified by MRN, date of birth, ID band Patient awake    Reviewed: Allergy & Precautions, NPO status , Patient's Chart, lab work & pertinent test results  History of Anesthesia Complications Negative for: history of anesthetic complications  Airway Mallampati: II  TM Distance: >3 FB Neck ROM: Full    Dental  (+) Missing,    Pulmonary former smoker,    Pulmonary exam normal        Cardiovascular hypertension, negative cardio ROS Normal cardiovascular exam     Neuro/Psych Anxiety negative neurological ROS     GI/Hepatic negative GI ROS, Neg liver ROS,   Endo/Other  Morbid obesity  Renal/GU negative Renal ROS  negative genitourinary   Musculoskeletal  (+) Arthritis ,   Abdominal   Peds  Hematology negative hematology ROS (+)   Anesthesia Other Findings Day of surgery medications reviewed with patient.  Reproductive/Obstetrics negative OB ROS                            Anesthesia Physical Anesthesia Plan  ASA: III  Anesthesia Plan: Bier Block and Bier Block-LIDOCAINE ONLY   Post-op Pain Management:    Induction:   PONV Risk Score and Plan: 2 and Treatment may vary due to age or medical condition, Propofol infusion, Ondansetron and Midazolam  Airway Management Planned: Natural Airway and Simple Face Mask  Additional Equipment: None  Intra-op Plan:   Post-operative Plan:   Informed Consent: I have reviewed the patients History and Physical, chart, labs and discussed the procedure including the risks, benefits and alternatives for the proposed anesthesia with the patient or authorized representative who has indicated his/her understanding and acceptance.     Dental advisory given  Plan Discussed with: CRNA  Anesthesia Plan Comments:         Anesthesia Quick Evaluation

## 2019-08-30 NOTE — Brief Op Note (Signed)
08/30/2019  1:48 PM  PATIENT:  Stacy Campos  49 y.o. female  PRE-OPERATIVE DIAGNOSIS:  LEFT CARPAL TUNNEL SYNDROME, LEFT TRIGGER THUMB  POST-OPERATIVE DIAGNOSIS:  LEFT CARPAL TUNNEL SYNDROME, LEFT TRIGGER THUMB  PROCEDURE:  Procedure(s) with comments: LEFT CARPAL TUNNEL RELEASE (Left) - IV REGIONAL FOREARM BIER BLOCK LEFT THUMB RELEASE TRIGGER FINGER/A-1 PULLEY (Left) - Bier block  SURGEON:  Surgeon(s) and Role:    * Daryll Brod, MD - Primary  PHYSICIAN ASSISTANT:   ASSISTANTS: none   ANESTHESIA:   local, regional and IV sedation  EBL:  2 mL   BLOOD ADMINISTERED:none  DRAINS: none   LOCAL MEDICATIONS USED:  BUPIVICAINE   SPECIMEN:  No Specimen  DISPOSITION OF SPECIMEN:  N/A  COUNTS:  YES  TOURNIQUET:   Total Tourniquet Time Documented: Forearm (Left) - 33 minutes Total: Forearm (Left) - 33 minutes   DICTATION: .Viviann Spare Dictation  PLAN OF CARE: Discharge to home after PACU  PATIENT DISPOSITION:  PACU - hemodynamically stable.
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
## 2019-09-03 ENCOUNTER — Encounter: Payer: Self-pay | Admitting: *Deleted

## 2019-11-12 DIAGNOSIS — L732 Hidradenitis suppurativa: Secondary | ICD-10-CM | POA: Diagnosis not present

## 2019-12-25 DIAGNOSIS — M65312 Trigger thumb, left thumb: Secondary | ICD-10-CM | POA: Diagnosis not present

## 2019-12-25 DIAGNOSIS — G5602 Carpal tunnel syndrome, left upper limb: Secondary | ICD-10-CM | POA: Diagnosis not present

## 2020-02-04 ENCOUNTER — Emergency Department (HOSPITAL_COMMUNITY): Payer: Medicaid Other

## 2020-02-04 ENCOUNTER — Emergency Department (HOSPITAL_COMMUNITY)
Admission: EM | Admit: 2020-02-04 | Discharge: 2020-02-04 | Disposition: A | Discharge status: Home / Self Care | Payer: Medicaid Other | Attending: Emergency Medicine | Admitting: Emergency Medicine

## 2020-02-04 DIAGNOSIS — R531 Weakness: Secondary | ICD-10-CM | POA: Diagnosis not present

## 2020-02-04 DIAGNOSIS — I6782 Cerebral ischemia: Secondary | ICD-10-CM | POA: Diagnosis not present

## 2020-02-04 DIAGNOSIS — I639 Cerebral infarction, unspecified: Secondary | ICD-10-CM | POA: Diagnosis not present

## 2020-02-04 DIAGNOSIS — Z79899 Other long term (current) drug therapy: Secondary | ICD-10-CM | POA: Insufficient documentation

## 2020-02-04 DIAGNOSIS — R4781 Slurred speech: Secondary | ICD-10-CM | POA: Insufficient documentation

## 2020-02-04 DIAGNOSIS — R000 Tachycardia, unspecified: Secondary | ICD-10-CM | POA: Diagnosis not present

## 2020-02-04 DIAGNOSIS — Z87891 Personal history of nicotine dependence: Secondary | ICD-10-CM | POA: Diagnosis not present

## 2020-02-04 DIAGNOSIS — G9340 Encephalopathy, unspecified: Secondary | ICD-10-CM | POA: Diagnosis not present

## 2020-02-04 DIAGNOSIS — I10 Essential (primary) hypertension: Secondary | ICD-10-CM | POA: Insufficient documentation

## 2020-02-04 DIAGNOSIS — Z20822 Contact with and (suspected) exposure to covid-19: Secondary | ICD-10-CM | POA: Diagnosis not present

## 2020-02-04 DIAGNOSIS — R471 Dysarthria and anarthria: Secondary | ICD-10-CM | POA: Diagnosis not present

## 2020-02-04 DIAGNOSIS — F8081 Childhood onset fluency disorder: Secondary | ICD-10-CM | POA: Diagnosis not present

## 2020-02-04 DIAGNOSIS — F419 Anxiety disorder, unspecified: Secondary | ICD-10-CM | POA: Insufficient documentation

## 2020-02-04 DIAGNOSIS — G255 Other chorea: Secondary | ICD-10-CM | POA: Diagnosis not present

## 2020-02-04 DIAGNOSIS — R29818 Other symptoms and signs involving the nervous system: Secondary | ICD-10-CM | POA: Diagnosis not present

## 2020-02-04 DIAGNOSIS — R200 Anesthesia of skin: Secondary | ICD-10-CM | POA: Insufficient documentation

## 2020-02-04 DIAGNOSIS — Z03818 Encounter for observation for suspected exposure to other biological agents ruled out: Secondary | ICD-10-CM | POA: Diagnosis not present

## 2020-02-04 DIAGNOSIS — R29810 Facial weakness: Secondary | ICD-10-CM | POA: Diagnosis not present

## 2020-02-04 LAB — RAPID URINE DRUG SCREEN, HOSP PERFORMED
Amphetamines: NOT DETECTED
Barbiturates: NOT DETECTED
Benzodiazepines: NOT DETECTED
Cocaine: NOT DETECTED
Opiates: NOT DETECTED
Tetrahydrocannabinol: POSITIVE — AB

## 2020-02-04 LAB — DIFFERENTIAL
Abs Immature Granulocytes: 0 10*3/uL (ref 0.00–0.07)
Basophils Absolute: 0 10*3/uL (ref 0.0–0.1)
Basophils Relative: 0 %
Eosinophils Absolute: 0.5 10*3/uL (ref 0.0–0.5)
Eosinophils Relative: 4 %
Lymphocytes Relative: 63 %
Lymphs Abs: 8 10*3/uL — ABNORMAL HIGH (ref 0.7–4.0)
Monocytes Absolute: 0.3 10*3/uL (ref 0.1–1.0)
Monocytes Relative: 2 %
Neutro Abs: 3.9 10*3/uL (ref 1.7–7.7)
Neutrophils Relative %: 31 %
nRBC: 0 /100 WBC

## 2020-02-04 LAB — CBC
HCT: 40.5 % (ref 36.0–46.0)
Hemoglobin: 12.6 g/dL (ref 12.0–15.0)
MCH: 26.7 pg (ref 26.0–34.0)
MCHC: 31.1 g/dL (ref 30.0–36.0)
MCV: 85.8 fL (ref 80.0–100.0)
Platelets: 307 10*3/uL (ref 150–400)
RBC: 4.72 MIL/uL (ref 3.87–5.11)
RDW: 13.6 % (ref 11.5–15.5)
WBC: 12.7 10*3/uL — ABNORMAL HIGH (ref 4.0–10.5)
nRBC: 0 % (ref 0.0–0.2)

## 2020-02-04 LAB — COMPREHENSIVE METABOLIC PANEL
ALT: 24 U/L (ref 0–44)
AST: 21 U/L (ref 15–41)
Albumin: 3.8 g/dL (ref 3.5–5.0)
Alkaline Phosphatase: 69 U/L (ref 38–126)
Anion gap: 14 (ref 5–15)
BUN: 11 mg/dL (ref 6–20)
CO2: 24 mmol/L (ref 22–32)
Calcium: 9.2 mg/dL (ref 8.9–10.3)
Chloride: 103 mmol/L (ref 98–111)
Creatinine, Ser: 1.01 mg/dL — ABNORMAL HIGH (ref 0.44–1.00)
GFR calc Af Amer: >60 mL/min (ref >60)
GFR calc non Af Amer: >60 mL/min (ref >60)
Glucose, Bld: 183 mg/dL — ABNORMAL HIGH (ref 70–99)
Potassium: 3.2 mmol/L — ABNORMAL LOW (ref 3.5–5.1)
Sodium: 141 mmol/L (ref 135–145)
Total Bilirubin: 0.6 mg/dL (ref 0.3–1.2)
Total Protein: 7.5 g/dL (ref 6.5–8.1)

## 2020-02-04 LAB — I-STAT CHEM 8, ED
BUN: 13 mg/dL (ref 6–20)
Calcium, Ion: 1.16 mmol/L (ref 1.15–1.40)
Chloride: 102 mmol/L (ref 98–111)
Creatinine, Ser: 0.9 mg/dL (ref 0.44–1.00)
Glucose, Bld: 190 mg/dL — ABNORMAL HIGH (ref 70–99)
HCT: 39 % (ref 36.0–46.0)
Hemoglobin: 13.3 g/dL (ref 12.0–15.0)
Potassium: 3.1 mmol/L — ABNORMAL LOW (ref 3.5–5.1)
Sodium: 143 mmol/L (ref 135–145)
TCO2: 27 mmol/L (ref 22–32)

## 2020-02-04 LAB — URINALYSIS, ROUTINE W REFLEX MICROSCOPIC
Bilirubin Urine: NEGATIVE
Glucose, UA: NEGATIVE mg/dL
Hgb urine dipstick: NEGATIVE
Ketones, ur: NEGATIVE mg/dL
Leukocytes,Ua: NEGATIVE
Nitrite: NEGATIVE
Protein, ur: 100 mg/dL — AB
Specific Gravity, Urine: >1.03 — ABNORMAL HIGH (ref 1.005–1.030)
pH: 5 (ref 5.0–8.0)

## 2020-02-04 LAB — URINALYSIS, MICROSCOPIC (REFLEX): RBC / HPF: NONE SEEN RBC/hpf (ref 0–5)

## 2020-02-04 LAB — PROTIME-INR
INR: 1 (ref 0.8–1.2)
Prothrombin Time: 13.2 seconds (ref 11.4–15.2)

## 2020-02-04 LAB — SARS CORONAVIRUS 2 BY RT PCR (HOSPITAL ORDER, PERFORMED IN ~~LOC~~ HOSPITAL LAB): SARS Coronavirus 2: NEGATIVE

## 2020-02-04 LAB — APTT: aPTT: 27 seconds (ref 24–36)

## 2020-02-04 LAB — ETHANOL: Alcohol, Ethyl (B): <10 mg/dL (ref <10)

## 2020-02-04 IMAGING — MR MR HEAD W/O CM
11 of 13 series · 38 of 48 positions shown · non-contrast
Comparison: Head CT from earlier today

CLINICAL DATA: Generalized weakness.

EXAM:
MRI HEAD WITHOUT CONTRAST
TECHNIQUE: Multiplanar, multiecho pulse sequences of the brain and surrounding
structures were obtained without intravenous contrast.

[Series 5: DWI · axial · 3.0mm · 0.88mm/px · z∈[-84,+66]mm · 7 of 102 slices shown (1 of 4)]
[im 1/102]
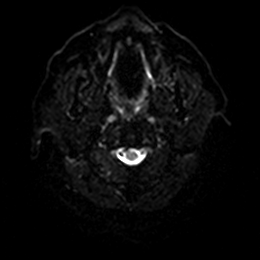
[im 17/102]
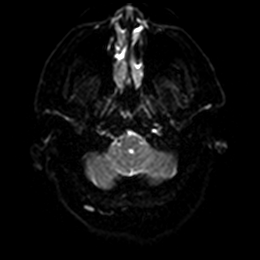
[im 34/102]
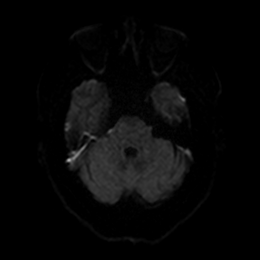
[im 51/102]
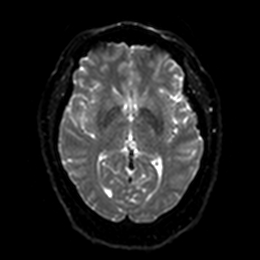
[im 68/102]
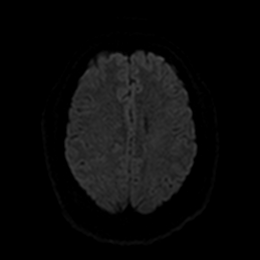
[im 85/102]
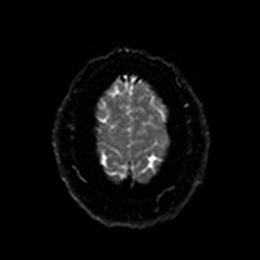
[im 102/102]
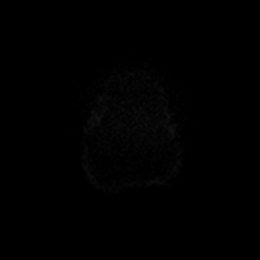

[Series 6: DWI · axial · 3.0mm · 0.88mm/px · z∈[-84,+66]mm · 3 of 51 slices shown (2 of 4)]
[im 1/51]
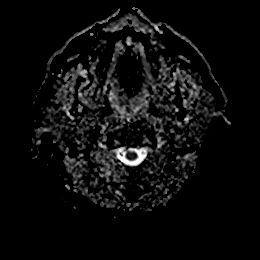
[im 26/51]
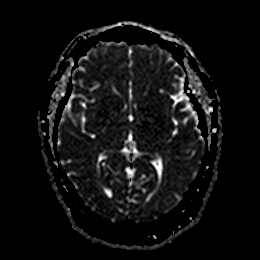
[im 51/51]
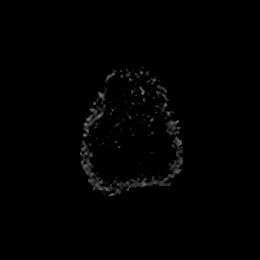

[Series 7: DWI · coronal · 4.0mm · 0.88mm/px · 5 of 66 slices shown (3 of 4)]
[im 1/66]
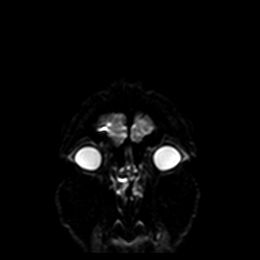
[im 17/66]
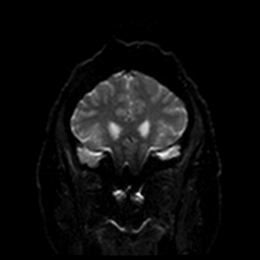
[im 33/66]
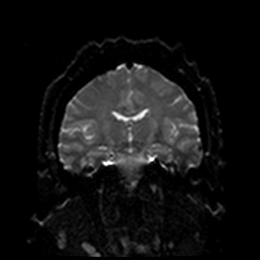
[im 49/66]
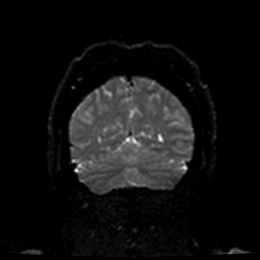
[im 66/66]
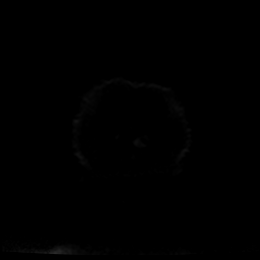

[Series 8: DWI · coronal · 4.0mm · 0.88mm/px · 3 of 33 slices shown (4 of 4)]
[im 1/33]
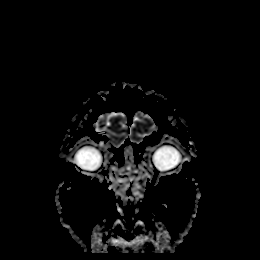
[im 17/33]
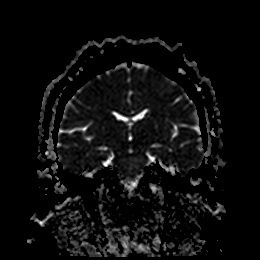
[im 33/33]
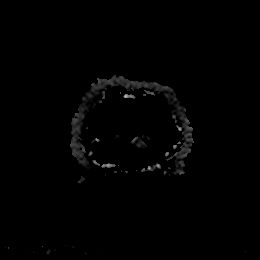

[Series 9: T1 · sagittal · 5.0mm · 0.75mm/px · 2 of 25 slices shown]
[im 1/25]
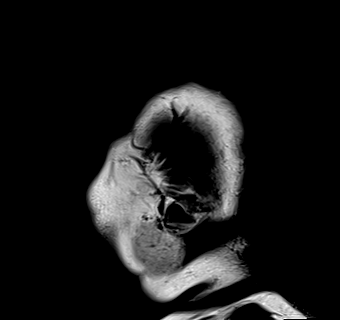
[im 25/25]
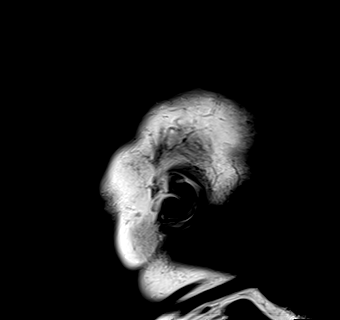

[Series 10: T2 · axial · 5.0mm · 0.72mm/px · z∈[-78,+66]mm · 2 of 25 slices shown (1 of 2)]
[im 1/25]
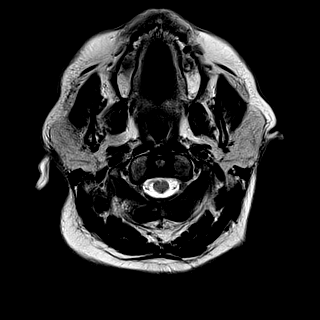
[im 25/25]
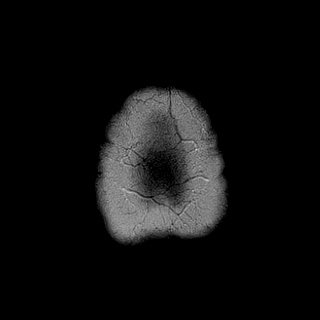

[Series 11: FLAIR · axial · 5.0mm · 0.45mm/px · z∈[-77,+66]mm · 2 of 25 slices shown (1 of 2)]
[im 1/25]
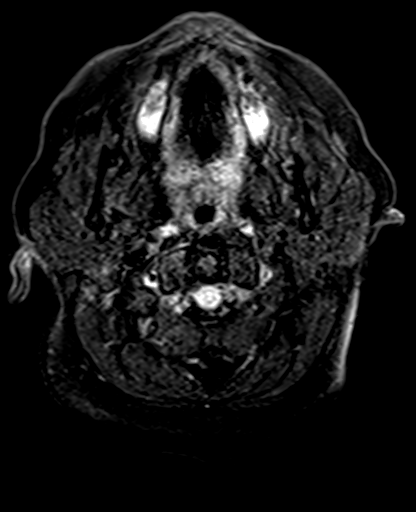
[im 25/25]
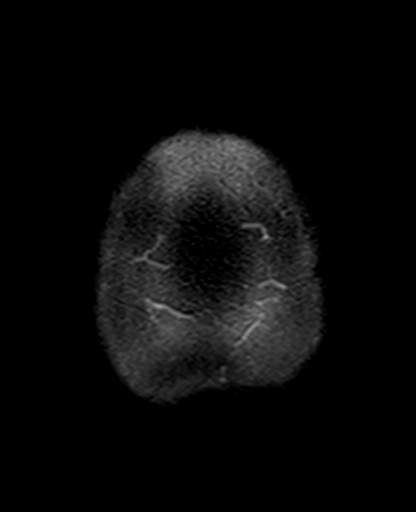

[Series 13: pha_images · axial · 3.0mm · 0.90mm/px · z∈[-88,+89]mm · 5 of 60 slices shown]
[im 1/60]
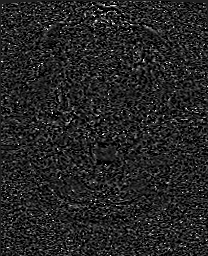
[im 15/60]
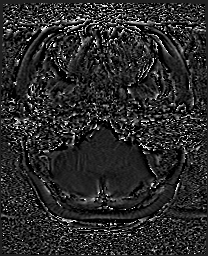
[im 30/60]
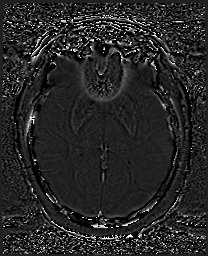
[im 45/60]
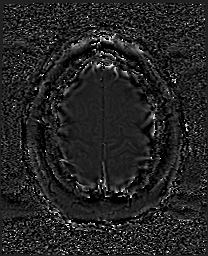
[im 60/60]
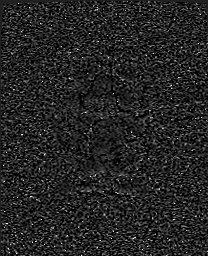

[Series 14: swi_images · axial · 3.0mm · 0.90mm/px · z∈[-88,+89]mm · 5 of 60 slices shown]
[im 1/60]
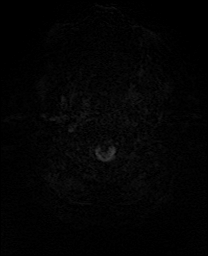
[im 15/60]
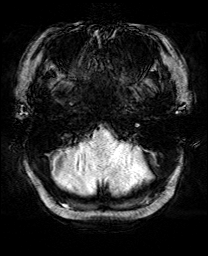
[im 30/60]
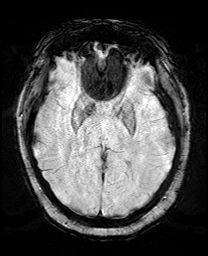
[im 45/60]
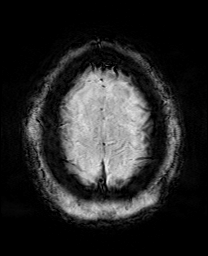
[im 60/60]
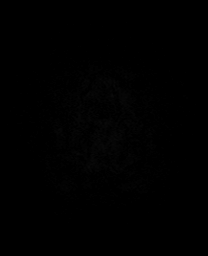

[Series 16: FLAIR · axial · 5.0mm · 0.45mm/px · z∈[-77,+66]mm · 2 of 25 slices shown (2 of 2)]
[im 1/25]
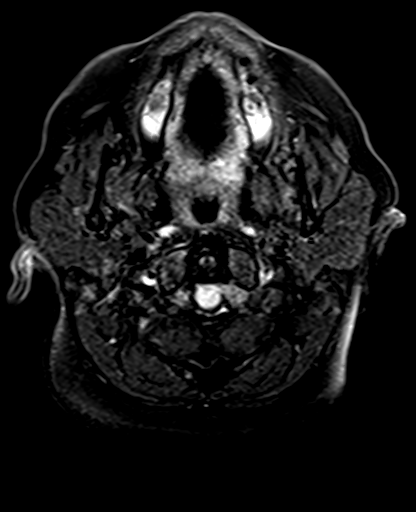
[im 25/25]
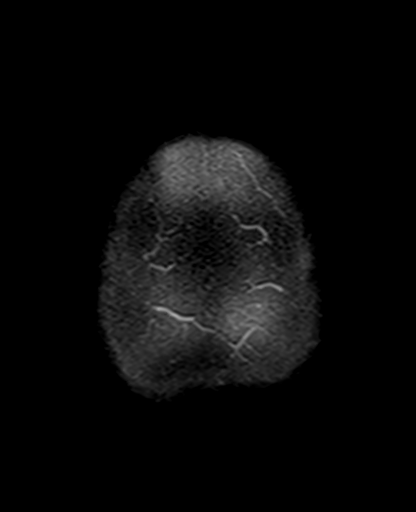

[Series 18: T2 · coronal · 5.0mm · 0.34mm/px · 2 of 29 slices shown (2 of 2)]
[im 1/29]
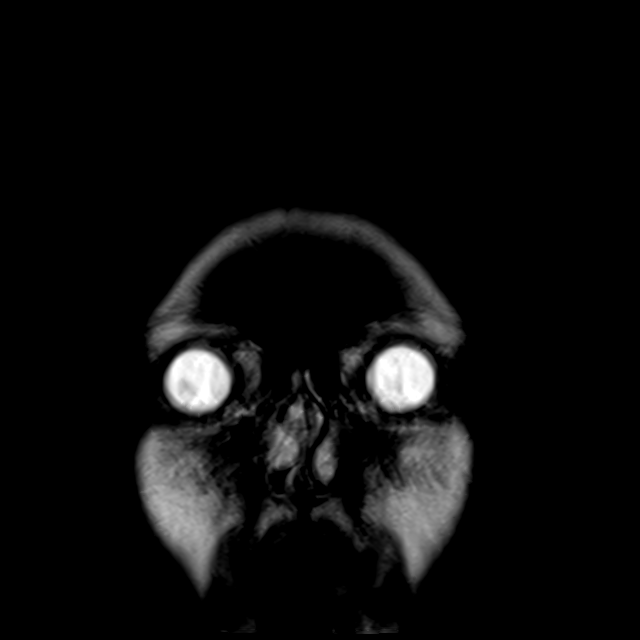
[im 29/29]
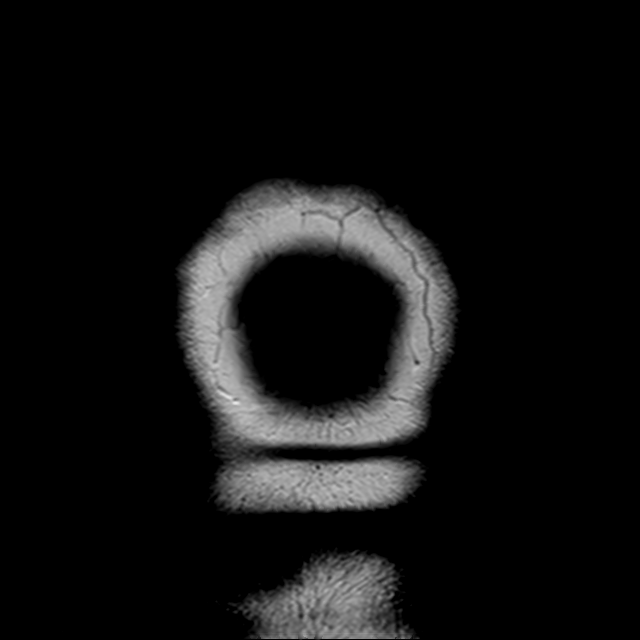

[38 of 48 positions shown; findings below may reference images not displayed]

FINDINGS: Brain: Ill-defined T2 hyperintensity in the pons without expansion.
There is history of hypertension. Supratentorial white matter is
unremarkable. Normal brain volume. No acute infarct, hemorrhage,
hydrocephalus, or masslike finding.

Vascular: Normal flow voids

Skull and upper cervical spine: Normal marrow signal

Sinuses/Orbits: Negative

Other: Mildly hypertrophic appearance of the tonsils.
IMPRESSION: 1. No acute finding such as infarct.
2. Moderate FLAIR hyperintensity in the pons, usually related to
chronic small vessel ischemia in the appropriate clinical setting.

## 2020-02-04 IMAGING — CT CT HEAD CODE STROKE
3 series · 14 of 47 positions shown, 16 images · non-contrast
Comparison: None available

CLINICAL DATA: Code stroke.  Right-sided numbness and dysarthria

EXAM:
CT HEAD WITHOUT CONTRAST
TECHNIQUE: Contiguous axial images were obtained from the base of the skull
through the vertex without intravenous contrast.

[Series 3: head 5.0 st · axial · 0.45mm/px · z∈[+730,+866]mm · 8 of 33 slices shown, 10 images]
[im 3/33  brain]
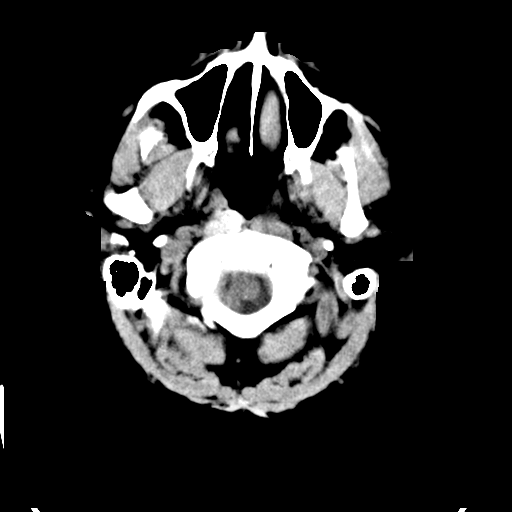
[im 3/33  bone]
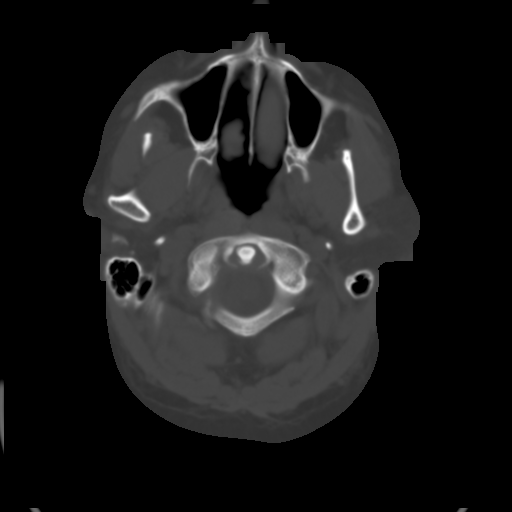
[im 7/33  brain]
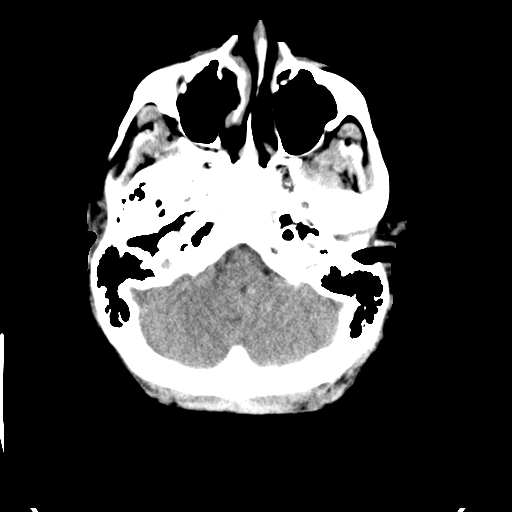
[im 10/33  brain]
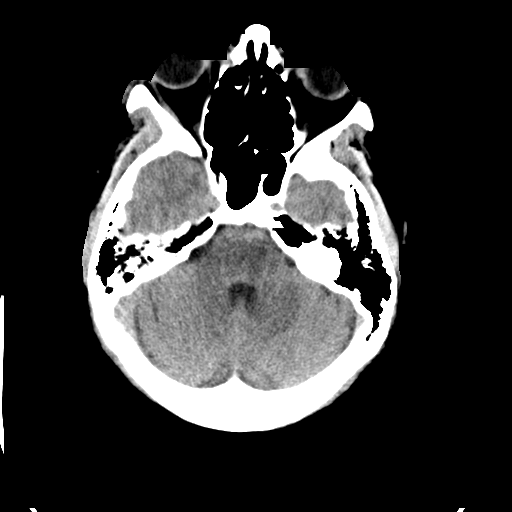
[im 15/33  brain]
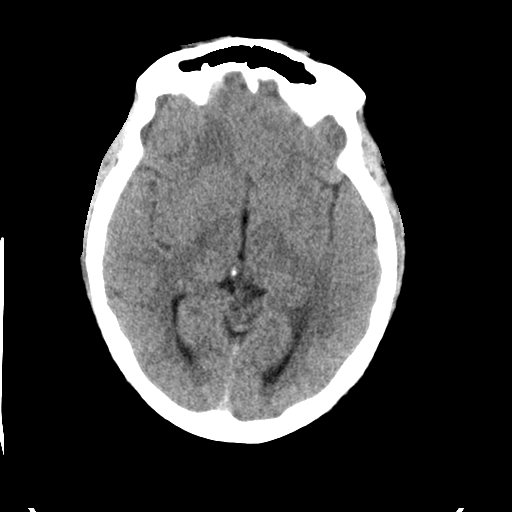
[im 18/33  brain]
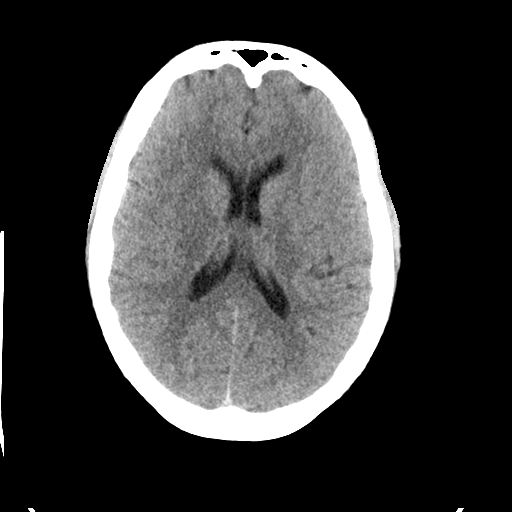
[im 18/33  bone]
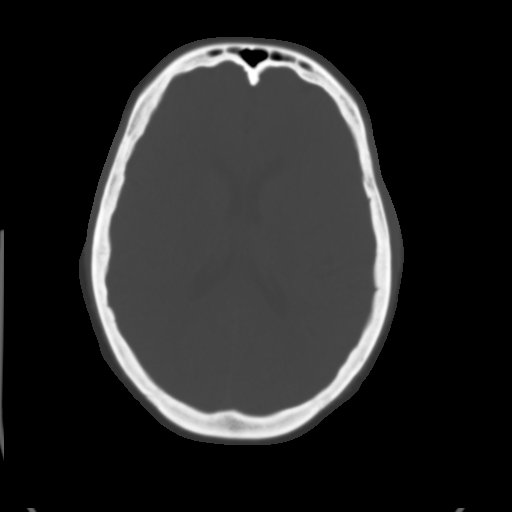
[im 23/33  brain]
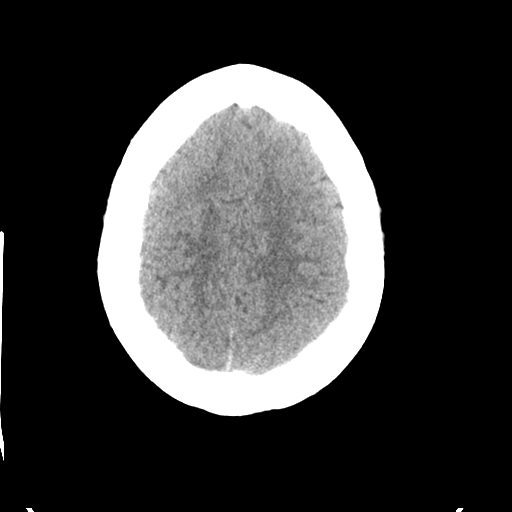
[im 26/33  brain]
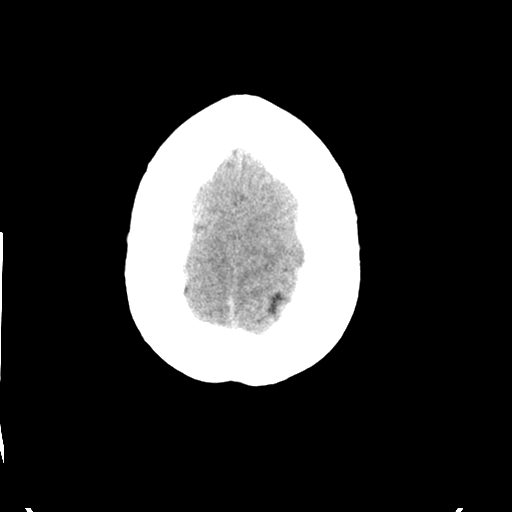
[im 30/33  brain]
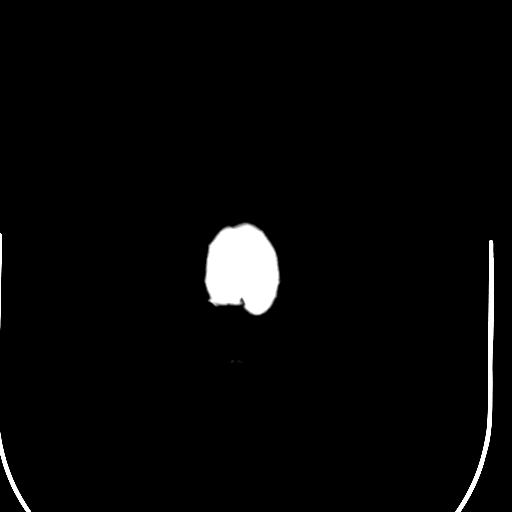

[Series 5: head 3.0 cor st · coronal · 0.32mm/px · 3 of 71 slices shown]
[im 24/71  brain]
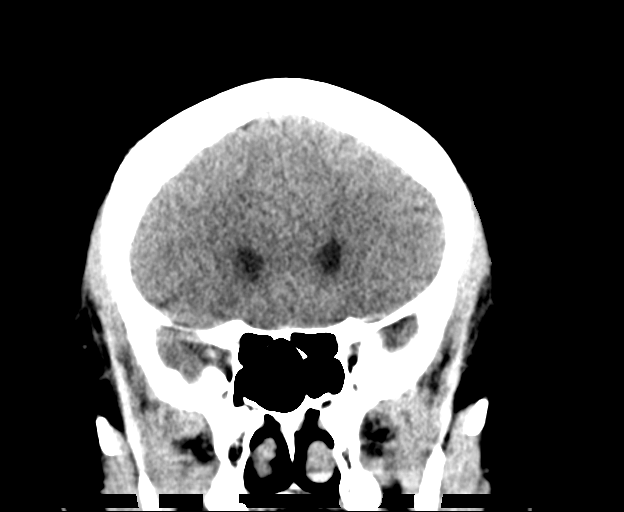
[im 32/71  brain]
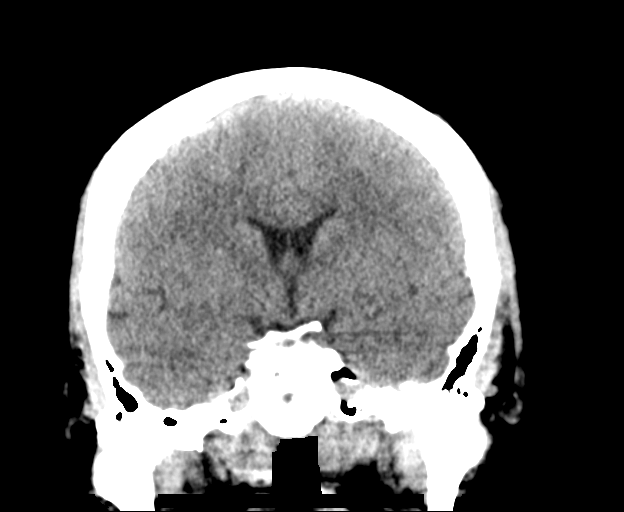
[im 39/71  brain]
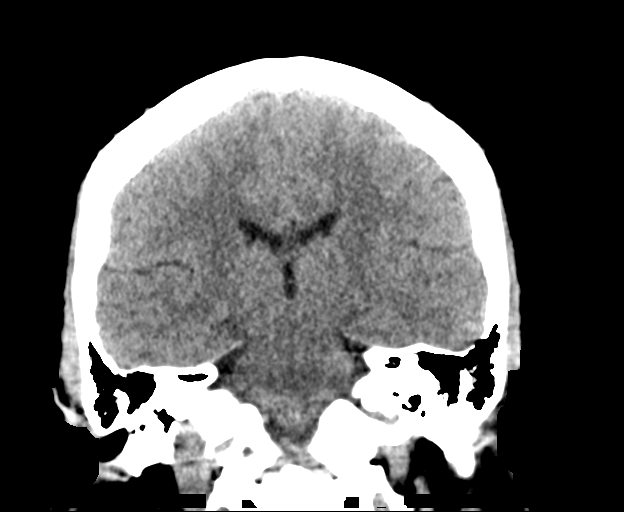

[Series 6: head 3.0 sag st · sagittal · 0.32mm/px · 3 of 67 slices shown]
[im 23/67  brain]
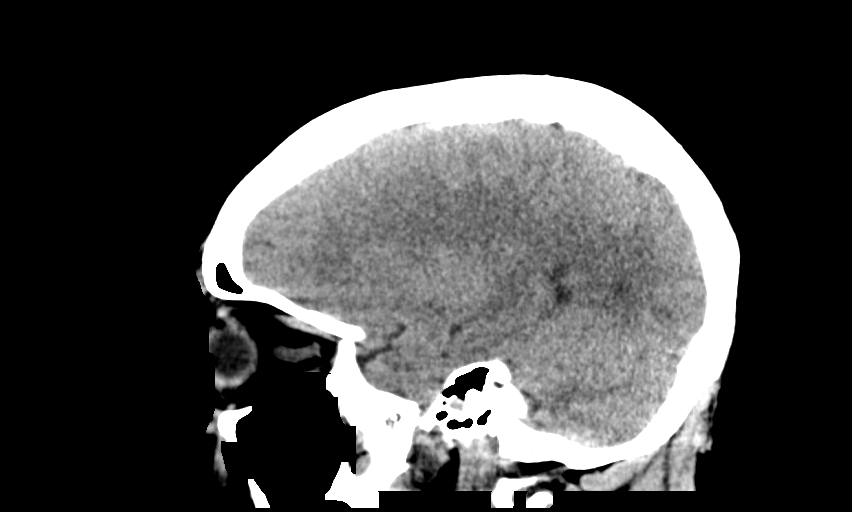
[im 34/67  brain]
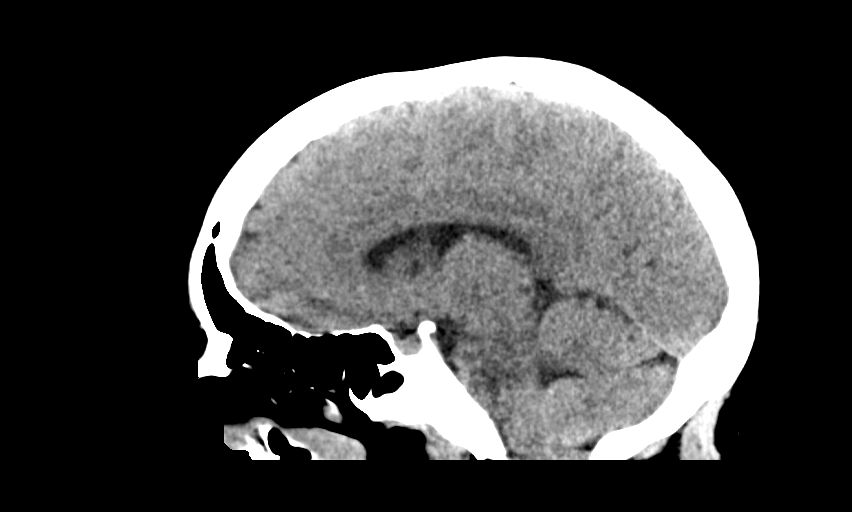
[im 45/67  brain]
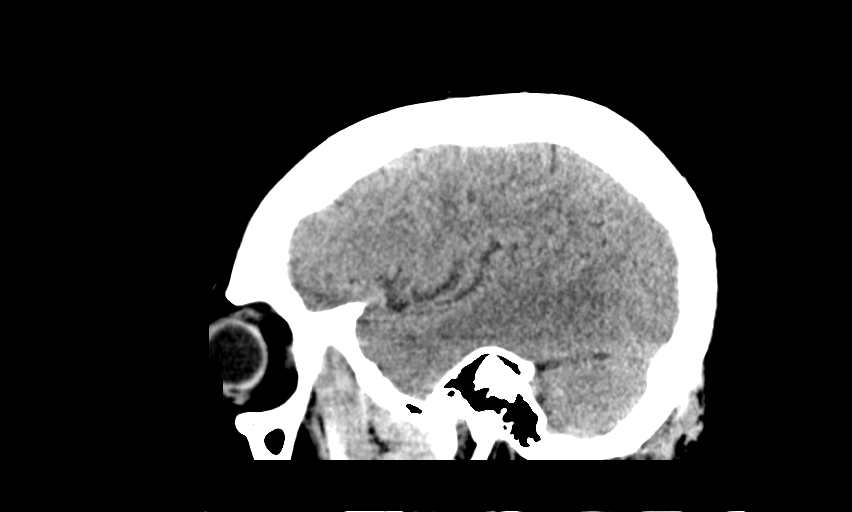

[14 of 47 positions shown; findings below may reference images not displayed]

FINDINGS: Brain: No evidence of acute infarction, hemorrhage, hydrocephalus,
extra-axial collection or mass lesion/mass effect.

Vascular: No hyperdense vessel or unexpected calcification.

Skull: Normal. Negative for fracture or focal lesion.

Sinuses/Orbits: No acute finding.

Other: These results were communicated to Dr. PALOMA at [DATE] PALOMA
[DATE]by text page via the [employer] messaging system.

ASPECTS (Alberta Stroke Program Early CT Score)

- Ganglionic level infarction (caudate, lentiform nuclei, internal
capsule, insula, M1-M3 cortex): 7

- Supraganglionic infarction (M4-M6 cortex): 3

Total score (0-10 with 10 being normal): 10
IMPRESSION: 1. Negative head CT.
2. ASPECTS is 10.

## 2020-02-04 MED ORDER — LORAZEPAM 2 MG/ML IJ SOLN: 2.0000 mg | Freq: Once | INTRAMUSCULAR | Status: AC

## 2020-02-04 MED ORDER — LORAZEPAM 2 MG/ML IJ SOLN
INTRAMUSCULAR | Status: AC
  Administered 2020-02-04: 2 mg via INTRAVENOUS
  Filled 2020-02-04: qty 1

## 2020-02-04 MED ORDER — SODIUM CHLORIDE 0.9 % IV BOLUS
1000.0000 mL | Freq: Once | INTRAVENOUS | Status: AC
  Administered 2020-02-04: 1000 mL via INTRAVENOUS

## 2020-02-04 MED ORDER — CEPHALEXIN 500 MG PO CAPS
500.0000 mg | ORAL_CAPSULE | Freq: Two times a day (BID) | ORAL | 0 refills | Status: AC
Start: 2020-02-04 — End: 2020-02-11

## 2020-02-04 MED ORDER — CEPHALEXIN 250 MG PO CAPS
500.0000 mg | ORAL_CAPSULE | Freq: Once | ORAL | Status: AC
  Administered 2020-02-04: 500 mg via ORAL
  Filled 2020-02-04: qty 2

## 2020-02-04 NOTE — ED Notes (Signed)
Patient verbalizes understanding of discharge instructions. Opportunity for questioning and answers were provided. Armband removed by staff, pt discharged from ED. Wheeled out to lobby  

## 2020-02-04 NOTE — ED Provider Notes (Signed)
I have personally examined this patient at change of shift, she is a 49 year old female, she presents with a complaint of some altered mental status.  The daughter is at the bedside providing most of the information here.  Evidently there was some increased anxiety yesterday when the daughter told her mother that she was going to be without a place to live within the next several weeks.  The mother has a small amount of underlying anxiety but no history of schizophrenia.  Reportedly she woke up in the night screaming and called for a family member who was in the house.  When they called 911 the patient was shaking all over but awake, this was not seizure-like, it was not rhythmic, it was disorganized.  EMS brings the patient to the hospital with concern for stroke however the work-up is included an MRI which was negative for any acute ischemia.  On my exam the patient is still somnolent, arousable and able to talk, sometimes clearly some at times slurred, she is moving all 4 extremities, she has what seems to be effort driven weakness and give out weakness, no facial droop.  She is denying any pain but states that she feels swollen in her legs, she does not have pitting edema but she is generally obese.  Pulses are normal, at rest she is not tachycardic but occasionally when she starts talking and getting anxious appearing she does become tachycardic.  I suspect this is underlying psychiatric driven.  She will need another liter of IV fluids as we still do not have a urinalysis.  She is refusing a rectal temperature.  She does not have complaints of a stiff neck sore throat coughing or shortness of breath.  After multiple repeat evaluations the patient is now more awake and alert and at her baseline mental status.  She states that she feels heavy all over but when she gets up to walk she is able to walk independently without assistance.  She is slow to move around but able to perform all of these tasks.  Her  urinalysis does reveal a positive marijuana screen as well as many bacteria which could be consistent with urinary tract infection.  I reviewed all of the results with the patient and her family member at the bedside.  They are comfortable going home, I recommended a couple of days of rest and to complete treatment for urinary tract infection.  The patient is stable for discharge at this time   Noemi Chapel, MD 02/04/20 1224
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
## 2020-02-04 NOTE — ED Notes (Signed)
Pt refusing to allow nursing staff to obtain rectal temp
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
## 2020-02-04 NOTE — ED Notes (Signed)
Pt ambulated in room. Tolerated well. Vitals remained stable.

## 2020-02-04 NOTE — ED Triage Notes (Signed)
Pt arrived from home via [facility]. Pt cried out at 0300 that she was having a stroke. When EMS arrived Pt was ambulatory,and  A&O x4. Pt is complaining of generalized weakness,, EMS vitals CBG 140, BP 108/74.

## 2020-02-04 NOTE — Discharge Instructions (Signed)
Your testing today has not shown any specific abnormalities other than a urinary tract infection.  It also shows that you have marijuana in your urine which means that you are either smoking it or you are around somebody else who is smoking it and you are breathing in the drug through the air.  Please avoid this at all costs.  Drink plenty of liquids, rest for the next 48 hours, take cephalexin twice a day for the next 7 days to treat for a urinary tract infection.  You will likely have some weakness but you should gradually improve with lots of fluids and rest  If you should develop severe or worsening symptoms return to the emergency department immediately otherwise follow-up with your doctor within 3 days for recheck and if you do not have a doctor see the phone number listed above

## 2020-02-04 NOTE — Plan of Care (Signed)
Asked to follow up MRI by Dr. Cheral Marker. MRI negative for any acute process. Chronic small vessel disease seen. Per Dr. Cheral Marker, exam suggestive of a functional etiology and might need Psychiatry consultation if deemed appropriate. No new recommendations from neurology at this time. Please call with questions as needed. -- Amie Portland, MD [facility] Pager: [phone] If 7pm to 7am, please call on call as listed on AMION.

## 2020-02-05 LAB — URINE CULTURE

## 2020-02-05 LAB — PATHOLOGIST SMEAR REVIEW

## 2020-02-11 ENCOUNTER — Ambulatory Visit: Payer: Medicaid Other | Admitting: Family Medicine

## 2020-02-11 ENCOUNTER — Other Ambulatory Visit: Payer: Self-pay

## 2020-02-11 DIAGNOSIS — L732 Hidradenitis suppurativa: Secondary | ICD-10-CM | POA: Diagnosis not present

## 2020-02-13 ENCOUNTER — Ambulatory Visit (INDEPENDENT_AMBULATORY_CARE_PROVIDER_SITE_OTHER): Payer: Medicaid Other | Admitting: Family Medicine

## 2020-02-13 ENCOUNTER — Other Ambulatory Visit: Payer: Self-pay

## 2020-02-13 ENCOUNTER — Encounter: Payer: Self-pay | Admitting: Family Medicine

## 2020-02-13 VITALS — BP 142/90 | HR 99 | Ht 67.0 in | Wt 315.4 lb

## 2020-02-13 DIAGNOSIS — F430 Acute stress reaction: Secondary | ICD-10-CM | POA: Diagnosis not present

## 2020-02-13 NOTE — Patient Instructions (Signed)
Psychiatry and [place], [place] [phone]  Psychiatrists [facility]  [facility] [address] [place]   [address], [place], [place] [address]   [place], [place] [address] [phone]     [phone]  Dr. Norma Fredrickson   [facility] [address]  [place] [address]  [place] [address] [phone]    [phone]  Sheralyn Boatman, [place] [address]  [place] [address]  [place] [address] [phone]    [phone]  Therapists [facility]  [facility] [address] [place], [place]  [facility]  [facility] [address]      [address] [place] [address]     [place], [place]       [phone]  [facility]  [facility] [address]   [address] [place], [place] [address]   [place], [place] [address] [phone]     [phone]  [facility]   [facility] [address]    [place], [place] [address]    [place], [place] [address] [phone]      [phone]

## 2020-02-17 DIAGNOSIS — F430 Acute stress reaction: Secondary | ICD-10-CM | POA: Insufficient documentation

## 2020-02-17 NOTE — Assessment & Plan Note (Signed)
Secondary to stressors at home, including her mother's failing health.  Patient is open to seeing a therapist/counselor. -Endorses that she is safe to herself and others, denies suicidal ideation -Patient given resources for therapists, counselors, psychiatrist -Asked to follow-up 4 weeks or sooner as needed

## 2020-02-17 NOTE — Progress Notes (Signed)
    SUBJECTIVE:   CHIEF COMPLAINT / HPI:   Follow-up for recent hospital visit: Patient reports to the clinic today for follow-up from recent hospital visit.  Patient reports she has had a lot of psychological stressors going on at home.  The patient and her siblings provide close care for their mother, whom she reports has not been doing very well, having lots of pain due to spinal stenosis, osteoarthritis, and rheumatoid arthritis.  It often occurs that the patient is called very early in the morning to rush over to her mom's house to help care for her for various reasons.  The patient's mom is due for an MRI which will help to isolate the nerves causing her the most pain in order to perform steroid injection, but this cannot be done until August 12th.  The patient reports that very recently her mother stated "I don't want to continue living on like this".  The patient reports that this was a trigger for her.  She woke up very early in the morning on 02/04/2020 and was hysterical, had right sided numbness.  She called 911 and was evaluated in the emergency department.  ED work-up was negative for stroke, the patient was very happy to hear this.  PHQ-9 score today is 12, answer to #9 is 0.  A GAD-7 was not performed today's visit.  The patient reports that she is doing much better today and is interested in seeking help from a counselor/therapist.  PERTINENT  PMH / PSH:  Patient Active Problem List   Diagnosis Date Noted  . Acute stress reaction 02/17/2020  . Palmar nodule, left 07/04/2019  . History of mammogram 07/04/2019  . Encounter for screening mammogram for breast cancer 07/04/2019  . Encounter for screening colonoscopy 07/04/2019  . Yeast infection of the skin 05/21/2019  . Dermatitis 05/21/2019  . Pneumonia due to COVID-19 virus 04/27/2019  . Respiratory failure with hypoxia ([place]) 04/27/2019  . Abnormal uterine bleeding 11/08/2018  . Morbid obesity ([place]) 05/01/2018  . Hydradenitis  12/07/2015  . Essential hypertension 01/07/2010    OBJECTIVE:   BP (!) 142/90   Pulse 99   Ht 5\' 7"  (1.702 m)   Wt (!) 315 lb 6 oz (143.1 kg)   LMP 10/25/2018   SpO2 98%   BMI 49.39 kg/m    Physical exam: General: Pleasant patient, nontoxic-appearing Respiratory: Comfortable work of breathing, speaking complete sentences Cardio: RRR, S1-S2 present, no murmurs appreciated Neuro: Cranial nerves II-XII intact, no focal neurological deficits appreciated  Depression screen PHQ 2/9 02/13/2020  Decreased Interest 1  Down, Depressed, Hopeless 1  PHQ - 2 Score 2  Altered sleeping 1  Tired, decreased energy 1  Change in appetite 2  Feeling bad or failure about yourself  2  Trouble concentrating 2  Moving slowly or fidgety/restless 2  Suicidal thoughts 0  PHQ-9 Score 12     ASSESSMENT/PLAN:   Acute stress reaction Secondary to stressors at home, including her mother's failing health.  Patient is open to seeing a therapist/counselor. -Endorses that she is safe to herself and others, denies suicidal ideation -Patient given resources for therapists, counselors, psychiatrist -Asked to follow-up 4 weeks or sooner as needed     Daisy Floro, [place]

## 2020-02-19 ENCOUNTER — Ambulatory Visit: Payer: Medicaid Other | Admitting: Family Medicine

## 2020-04-01 DIAGNOSIS — G5602 Carpal tunnel syndrome, left upper limb: Secondary | ICD-10-CM | POA: Diagnosis not present

## 2020-04-01 DIAGNOSIS — M65312 Trigger thumb, left thumb: Secondary | ICD-10-CM | POA: Diagnosis not present

## 2020-04-01 DIAGNOSIS — M65332 Trigger finger, left middle finger: Secondary | ICD-10-CM | POA: Diagnosis not present

## 2020-05-11 ENCOUNTER — Ambulatory Visit: Payer: Medicaid Other

## 2020-05-12 DIAGNOSIS — L732 Hidradenitis suppurativa: Secondary | ICD-10-CM | POA: Diagnosis not present

## 2020-05-13 ENCOUNTER — Encounter: Payer: Self-pay | Admitting: Family Medicine

## 2020-05-13 ENCOUNTER — Other Ambulatory Visit: Payer: Self-pay

## 2020-05-13 ENCOUNTER — Ambulatory Visit (INDEPENDENT_AMBULATORY_CARE_PROVIDER_SITE_OTHER): Payer: Medicaid Other | Admitting: Family Medicine

## 2020-05-13 VITALS — BP 118/84 | HR 85 | Ht 67.0 in | Wt 317.2 lb

## 2020-05-13 DIAGNOSIS — R109 Unspecified abdominal pain: Secondary | ICD-10-CM

## 2020-05-13 HISTORY — DX: Unspecified abdominal pain: R10.9

## 2020-05-13 LAB — POCT URINALYSIS DIP (CLINITEK)
Bilirubin, UA: NEGATIVE
Blood, UA: NEGATIVE
Glucose, UA: NEGATIVE mg/dL
Ketones, POC UA: NEGATIVE mg/dL
Leukocytes, UA: NEGATIVE
Nitrite, UA: NEGATIVE
POC PROTEIN,UA: NEGATIVE
Spec Grav, UA: 1.025 (ref 1.010–1.025)
Urobilinogen, UA: 0.2 E.U./dL
pH, UA: 5.5 (ref 5.0–8.0)

## 2020-05-13 NOTE — Progress Notes (Signed)
    CHIEF COMPLAINT / HPI: Left mid to low back pain for about 2-1/2 weeks.  He has never had this before.  She is worried about her kidneys as her mom had renal failure.  She does seem to have some slight change in her symptoms when she urinates.  Sometimes it makes it better and sometimes it makes it worse with it always seems to affect the pain right over the left flank/back area.  She has had no hematuria.  She did have 1 day of fever at home with some fatigue.  No diarrhea.  No abdominal pain.  No injury.  She is not been doing any unusual activities.   PERTINENT  PMH / PSH: I have reviewed the patient's medications, allergies, past medical and surgical history, smoking status and updated in the EMR as appropriate. Previous urinalyses in her chart revealed at least one episode of calcium oxalate crystals seen in her urine.  OBJECTIVE:  BP 118/84   Pulse 85   Ht 5\' 7"  (1.702 m)   Wt (!) 317 lb 3.2 oz (143.9 kg)   LMP 10/25/2018   SpO2 96%   BMI 49.68 kg/m  GENERAL: Well-developed overweight female no acute distress Back: Tender to Palpation over the Area Right under Her Left Costovertebral Angle.  Percussion Is Painful.  She Does Have Tenderness in the Lumbar Musculature Running down to the PSIS Right below This Area.  There Is No Tenderness like This on the Right Side of the Back.  She Has Normal Percussion of the Vertebra without Any Tenderness. ABDOMEN: Soft, nontender. Urinalysis: Dipstick urinalysis is negative.  ASSESSMENT / PLAN:   Acute flank pain She is pretty tender over the left costovertebral angle area.  Urinalysis is negative.  I gave her options including empiric treatment for UTI although she is really showing limited systemic symptoms at this time, she did have 1 day of fever and fatigue.  She could be having intermittent symptoms from a stone.  Given her history of calcium oxalate crystals this is slightly more likely.  She would like to be sure so we will schedule  her for a CT stone study.  I will also check CBC and BMP today to look at her renal function and to additionally rule out any other indicator of infection.  She is in agreement with this plan.  I will also culture her urine.   Dorcas Mcmurray MD

## 2020-05-13 NOTE — Patient Instructions (Signed)
I will send you a note about your labs. IF there is something urgent, I will call you. You may also see the results in My Chart  We are setting up a CT stone study to rule out kidney stones.  I will  Send you results of that after you have it and again IF there is something acutely unusual on the study I will call you with results.

## 2020-05-13 NOTE — Assessment & Plan Note (Signed)
She is pretty tender over the left costovertebral angle area.  Urinalysis is negative.  I gave her options including empiric treatment for UTI although she is really showing limited systemic symptoms at this time, she did have 1 day of fever and fatigue.  She could be having intermittent symptoms from a stone.  Given her history of calcium oxalate crystals this is slightly more likely.  She would like to be sure so we will schedule her for a CT stone study.  I will also check CBC and BMP today to look at her renal function and to additionally rule out any other indicator of infection.  She is in agreement with this plan.  I will also culture her urine.

## 2020-05-14 LAB — BASIC METABOLIC PANEL
BUN/Creatinine Ratio: 11 (ref 9–23)
BUN: 9 mg/dL (ref 6–24)
CO2: 25 mmol/L (ref 20–29)
Calcium: 9.2 mg/dL (ref 8.7–10.2)
Chloride: 101 mmol/L (ref 96–106)
Creatinine, Ser: 0.81 mg/dL (ref 0.57–1.00)
GFR calc Af Amer: 98 mL/min/{1.73_m2} (ref >59)
GFR calc non Af Amer: 85 mL/min/{1.73_m2} (ref >59)
Glucose: 96 mg/dL (ref 65–99)
Potassium: 4.4 mmol/L (ref 3.5–5.2)
Sodium: 139 mmol/L (ref 134–144)

## 2020-05-14 LAB — CBC
Hematocrit: 38 % (ref 34.0–46.6)
Hemoglobin: 12.4 g/dL (ref 11.1–15.9)
MCH: 26.8 pg (ref 26.6–33.0)
MCHC: 32.6 g/dL (ref 31.5–35.7)
MCV: 82 fL (ref 79–97)
Platelets: 260 10*3/uL (ref 150–450)
RBC: 4.63 x10E6/uL (ref 3.77–5.28)
RDW: 13.3 % (ref 11.7–15.4)
WBC: 9.5 10*3/uL (ref 3.4–10.8)

## 2020-05-15 LAB — URINE CULTURE

## 2020-05-22 ENCOUNTER — Other Ambulatory Visit: Payer: Self-pay

## 2020-05-22 ENCOUNTER — Ambulatory Visit
Admission: RE | Admit: 2020-05-22 | Discharge: 2020-05-22 | Disposition: A | Discharge status: Home / Self Care | Payer: Medicaid Other | Source: Ambulatory Visit | Attending: Family Medicine | Admitting: Family Medicine

## 2020-05-22 DIAGNOSIS — R109 Unspecified abdominal pain: Secondary | ICD-10-CM | POA: Diagnosis not present

## 2020-05-22 DIAGNOSIS — M47816 Spondylosis without myelopathy or radiculopathy, lumbar region: Secondary | ICD-10-CM | POA: Diagnosis not present

## 2020-05-22 IMAGING — CT CT RENAL STONE PROTOCOL
1 of 2 series · 14 of 32 positions shown, 19 images · non-contrast
Comparison: None.

CLINICAL DATA: Left-sided flank pain.  Kidney stones suspected.

EXAM:
CT ABDOMEN AND PELVIS WITHOUT CONTRAST
TECHNIQUE: Multidetector CT imaging of the abdomen and pelvis was performed
following the standard protocol without IV contrast.

[Series 2: renal standard/full · axial · 0.98mm/px · z∈[-454,-54]mm · 14 of 90 slices shown, 19 images]
[im 5/90  soft-tissue]
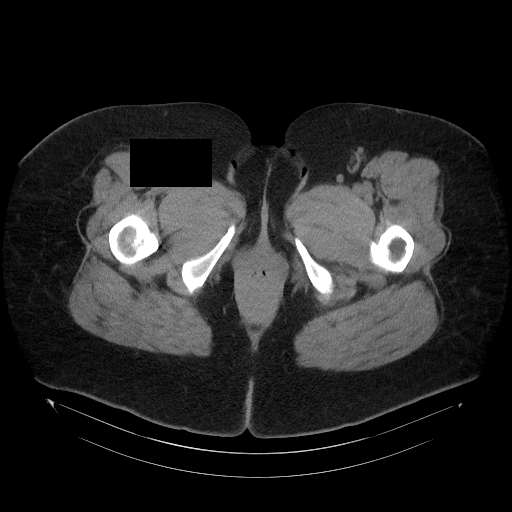
[im 5/90  bone]
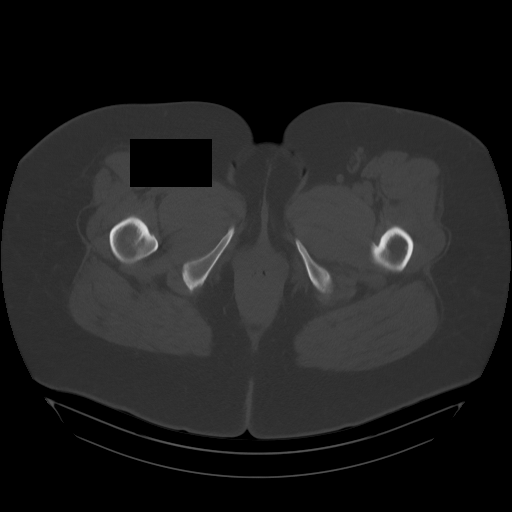
[im 15/90  soft-tissue]
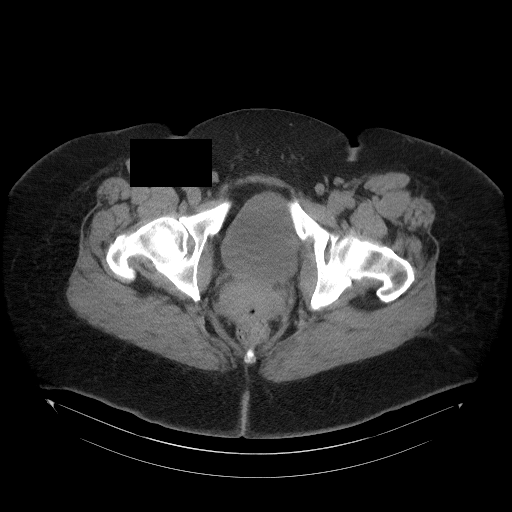
[im 19/90  soft-tissue]
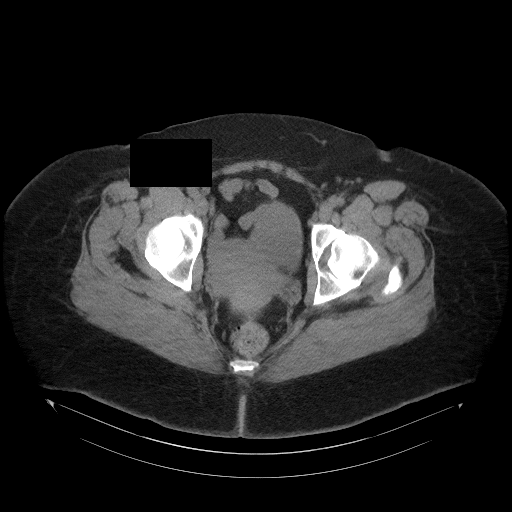
[im 24/90  soft-tissue]
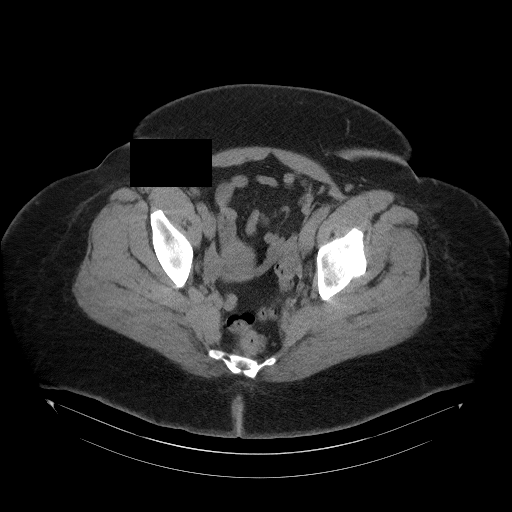
[im 33/90  soft-tissue]
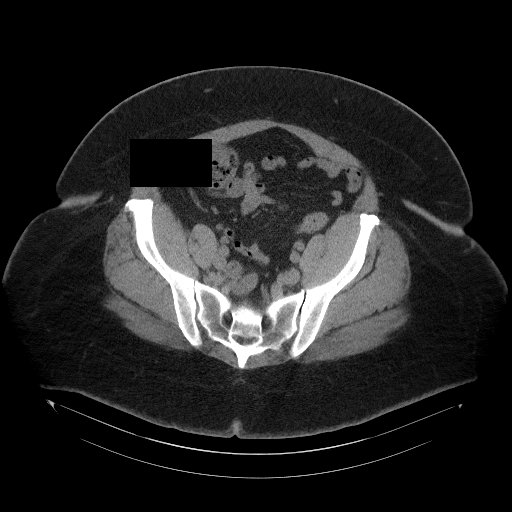
[im 38/90  soft-tissue]
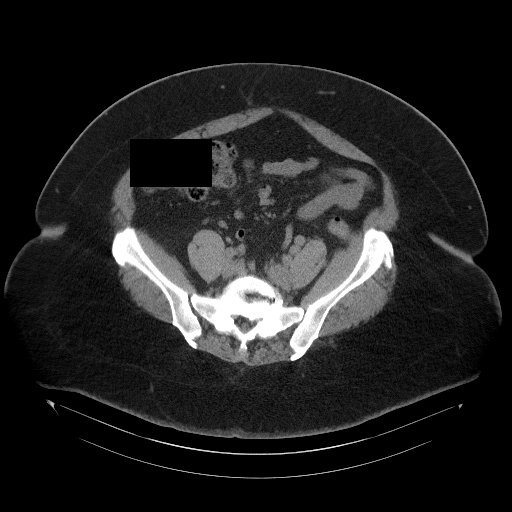
[im 47/90  soft-tissue]
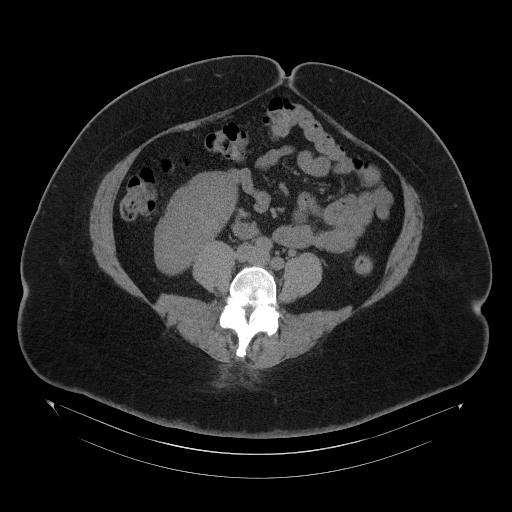
[im 52/90  soft-tissue]
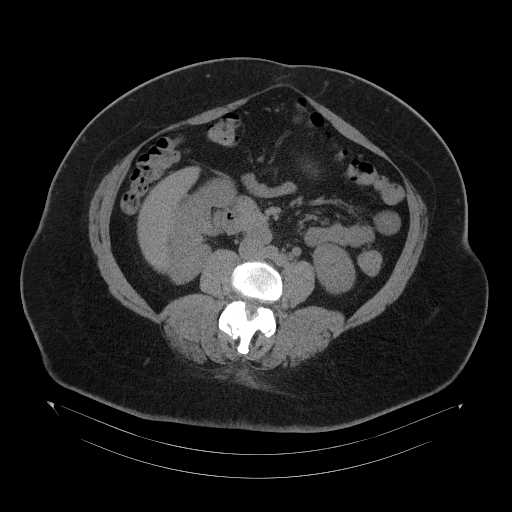
[im 57/90  soft-tissue]
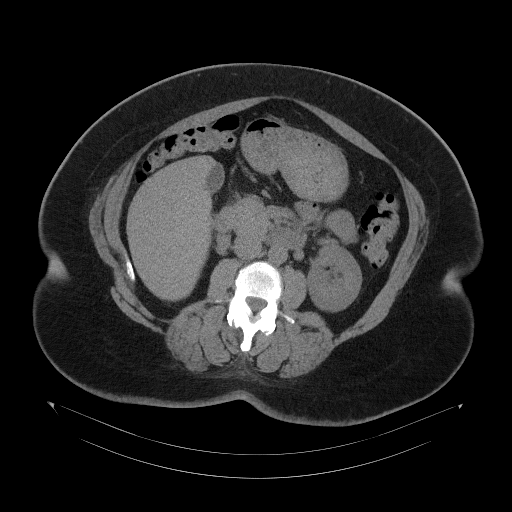
[im 57/90  bone]
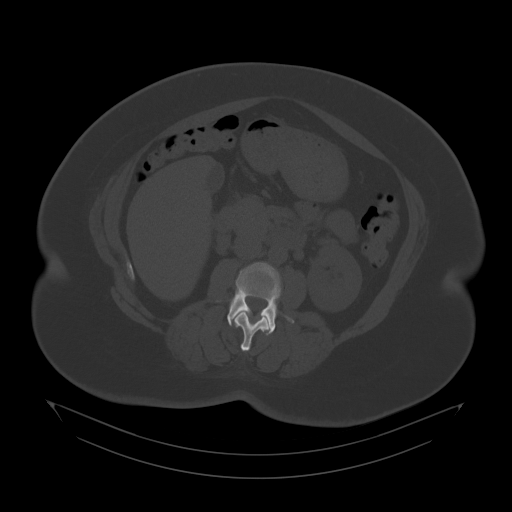
[im 66/90  soft-tissue]
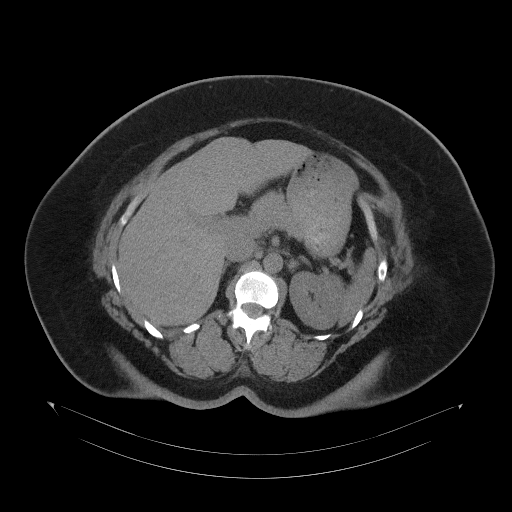
[im 71/90  soft-tissue]
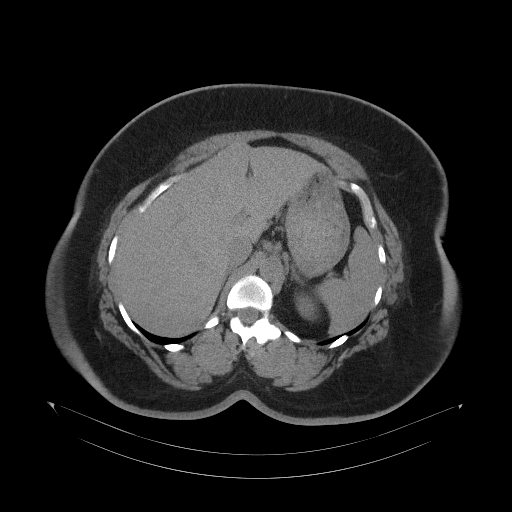
[im 71/90  lung]
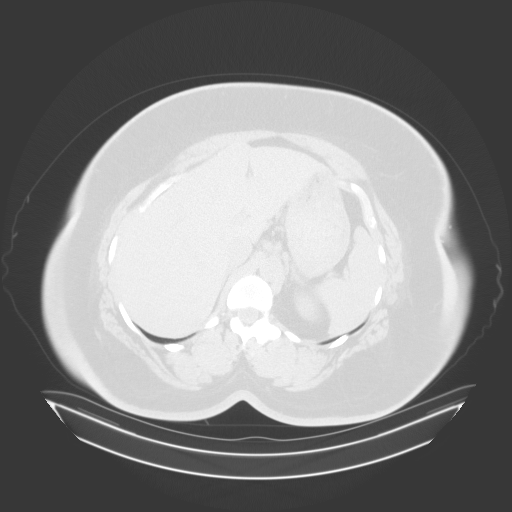
[im 75/90  soft-tissue]
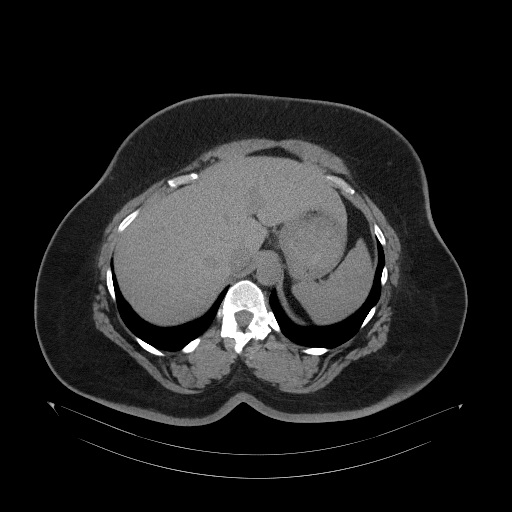
[im 75/90  lung]
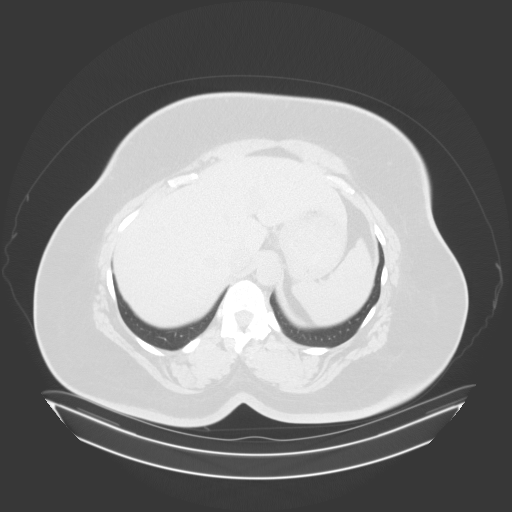
[im 80/90  lung]
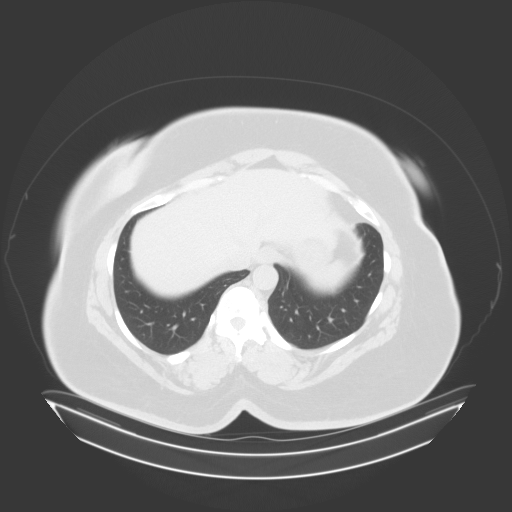
[im 85/90  soft-tissue]
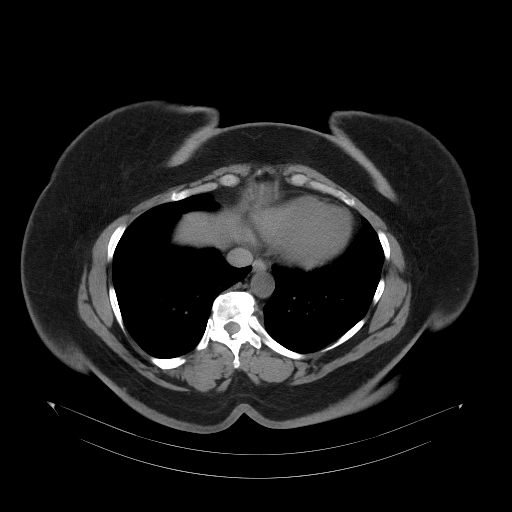
[im 85/90  lung]
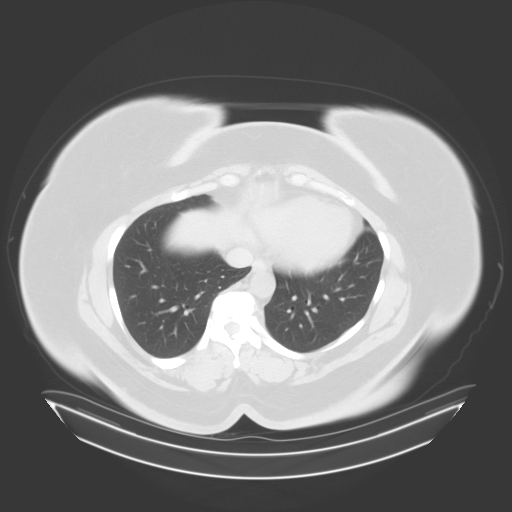

[14 of 32 positions shown; findings below may reference images not displayed]

FINDINGS: Lower chest: The lung bases are clear. The heart size is normal.

Hepatobiliary: The liver is normal. Normal gallbladder.There is no
biliary ductal dilation.

Pancreas: Normal contours without ductal dilatation. No
peripancreatic fluid collection.

Spleen: Unremarkable.

Adrenals/Urinary Tract:

--Adrenal glands: Unremarkable.

--Right kidney/ureter: No hydronephrosis or radiopaque kidney
stones.

--Left kidney/ureter: No hydronephrosis or radiopaque kidney stones.

--Urinary bladder: Unremarkable.

Stomach/Bowel:

--Stomach/Duodenum: No hiatal hernia or other gastric abnormality.
Normal duodenal course and caliber.

--Small bowel: Unremarkable.

--Colon: Unremarkable.

--Appendix: Normal.

Vascular/Lymphatic: Normal course and caliber of the major abdominal
vessels. The left ovarian vein is dilated measuring up to
approximately 1 cm in diameter (axial series 2, image 34).

--No retroperitoneal lymphadenopathy.

--No mesenteric lymphadenopathy.

--No pelvic or inguinal lymphadenopathy.

Reproductive: Unremarkable

Other: No ascites or free air. The abdominal wall is normal.

Musculoskeletal. No acute displaced fractures. Degenerative changes
are noted throughout the lumbar spine
IMPRESSION: 1. No acute abdominopelvic abnormality.
2. Dilated left ovarian vein, which can be seen in the setting of
pelvic congestion syndrome.

## 2020-05-28 ENCOUNTER — Encounter: Payer: Self-pay | Admitting: Family Medicine

## 2020-09-26 ENCOUNTER — Encounter (HOSPITAL_COMMUNITY): Payer: Self-pay

## 2020-09-26 ENCOUNTER — Ambulatory Visit (HOSPITAL_COMMUNITY)
Admission: EM | Admit: 2020-09-26 | Discharge: 2020-09-26 | Disposition: A | Discharge status: Home / Self Care | Payer: Medicaid Other | Attending: Emergency Medicine | Admitting: Emergency Medicine

## 2020-09-26 ENCOUNTER — Other Ambulatory Visit: Payer: Self-pay

## 2020-09-26 DIAGNOSIS — B029 Zoster without complications: Secondary | ICD-10-CM

## 2020-09-26 DIAGNOSIS — H9201 Otalgia, right ear: Secondary | ICD-10-CM

## 2020-09-26 MED ORDER — FLUORESCEIN SODIUM 1 MG OP STRP
ORAL_STRIP | OPHTHALMIC | Status: AC
  Filled 2020-09-26: qty 1

## 2020-09-26 MED ORDER — TETRACAINE HCL 0.5 % OP SOLN
OPHTHALMIC | Status: AC
  Filled 2020-09-26: qty 4

## 2020-09-26 MED ORDER — VALACYCLOVIR HCL 1 G PO TABS: 1000.0000 mg | ORAL_TABLET | Freq: Three times a day (TID) | ORAL | 0 refills | Status: AC

## 2020-09-26 MED ORDER — FLUORESCEIN SODIUM 1 MG OP STRP
1.0000 | ORAL_STRIP | Freq: Once | OPHTHALMIC | Status: AC
  Administered 2020-09-26: 1 via OPHTHALMIC

## 2020-09-26 MED ORDER — TETRACAINE HCL 0.5 % OP SOLN
1.0000 [drp] | Freq: Once | OPHTHALMIC | Status: AC
  Administered 2020-09-26: 2 [drp] via OPHTHALMIC

## 2020-09-26 MED ORDER — IBUPROFEN 600 MG PO TABS: 600.0000 mg | ORAL_TABLET | Freq: Four times a day (QID) | ORAL | 0 refills | Status: DC | PRN

## 2020-09-26 MED ORDER — MUPIROCIN 2 % EX OINT: 1.0000 "application " | TOPICAL_OINTMENT | Freq: Two times a day (BID) | CUTANEOUS | 0 refills | Status: DC

## 2020-09-26 NOTE — ED Triage Notes (Signed)
Pt presents with sore throat and right ear pain X 3 days.

## 2020-09-26 NOTE — Discharge Instructions (Signed)
I am concerned that this could be early shingles.  It is difficult to make the diagnosis until a blistery rash shows up.  Take 600 mg of ibuprofen combined with 1000 mg of Tylenol together 3 or 4 times a day as needed for pain.  Cool compresses, Systane for your eye.  The Valtrex will reduce the severity and duration of shingles outbreak.  Bactroban for when the blisters ruptured and start crusting over to help prevent secondary bacterial infection
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
## 2021-02-19 ENCOUNTER — Emergency Department (HOSPITAL_COMMUNITY): Payer: Medicaid Other

## 2021-02-19 ENCOUNTER — Encounter (HOSPITAL_COMMUNITY): Payer: Self-pay | Admitting: Student

## 2021-02-19 ENCOUNTER — Other Ambulatory Visit: Payer: Self-pay

## 2021-02-19 ENCOUNTER — Observation Stay (HOSPITAL_COMMUNITY)
Admission: EM | Admit: 2021-02-19 | Discharge: 2021-02-21 | Disposition: A | Discharge status: Home / Self Care | Payer: Medicaid Other | Attending: Family Medicine | Admitting: Family Medicine

## 2021-02-19 DIAGNOSIS — Z87891 Personal history of nicotine dependence: Secondary | ICD-10-CM | POA: Insufficient documentation

## 2021-02-19 DIAGNOSIS — U071 COVID-19: Principal | ICD-10-CM | POA: Insufficient documentation

## 2021-02-19 DIAGNOSIS — Z79899 Other long term (current) drug therapy: Secondary | ICD-10-CM | POA: Diagnosis not present

## 2021-02-19 DIAGNOSIS — R29898 Other symptoms and signs involving the musculoskeletal system: Secondary | ICD-10-CM | POA: Diagnosis not present

## 2021-02-19 DIAGNOSIS — R531 Weakness: Secondary | ICD-10-CM | POA: Diagnosis not present

## 2021-02-19 DIAGNOSIS — R6889 Other general symptoms and signs: Secondary | ICD-10-CM | POA: Diagnosis not present

## 2021-02-19 DIAGNOSIS — I499 Cardiac arrhythmia, unspecified: Secondary | ICD-10-CM | POA: Diagnosis not present

## 2021-02-19 DIAGNOSIS — R509 Fever, unspecified: Secondary | ICD-10-CM | POA: Diagnosis not present

## 2021-02-19 DIAGNOSIS — I639 Cerebral infarction, unspecified: Secondary | ICD-10-CM | POA: Diagnosis not present

## 2021-02-19 DIAGNOSIS — Z8616 Personal history of COVID-19: Secondary | ICD-10-CM | POA: Diagnosis not present

## 2021-02-19 DIAGNOSIS — I10 Essential (primary) hypertension: Secondary | ICD-10-CM | POA: Diagnosis not present

## 2021-02-19 DIAGNOSIS — R29810 Facial weakness: Secondary | ICD-10-CM | POA: Diagnosis not present

## 2021-02-19 DIAGNOSIS — R079 Chest pain, unspecified: Secondary | ICD-10-CM | POA: Insufficient documentation

## 2021-02-19 DIAGNOSIS — R479 Unspecified speech disturbances: Secondary | ICD-10-CM

## 2021-02-19 DIAGNOSIS — Z743 Need for continuous supervision: Secondary | ICD-10-CM | POA: Diagnosis not present

## 2021-02-19 DIAGNOSIS — I517 Cardiomegaly: Secondary | ICD-10-CM | POA: Diagnosis not present

## 2021-02-19 LAB — RESP PANEL BY RT-PCR (FLU A&B, COVID) ARPGX2
Influenza A by PCR: NEGATIVE
Influenza B by PCR: NEGATIVE
SARS Coronavirus 2 by RT PCR: POSITIVE — AB

## 2021-02-19 LAB — I-STAT CHEM 8, ED
BUN: 7 mg/dL (ref 6–20)
Calcium, Ion: 1.15 mmol/L (ref 1.15–1.40)
Chloride: 100 mmol/L (ref 98–111)
Creatinine, Ser: 0.8 mg/dL (ref 0.44–1.00)
Glucose, Bld: 105 mg/dL — ABNORMAL HIGH (ref 70–99)
HCT: 41 % (ref 36.0–46.0)
Hemoglobin: 13.9 g/dL (ref 12.0–15.0)
Potassium: 3.5 mmol/L (ref 3.5–5.1)
Sodium: 140 mmol/L (ref 135–145)
TCO2: 28 mmol/L (ref 22–32)

## 2021-02-19 LAB — RAPID URINE DRUG SCREEN, HOSP PERFORMED
Amphetamines: NOT DETECTED
Barbiturates: NOT DETECTED
Benzodiazepines: NOT DETECTED
Cocaine: NOT DETECTED
Opiates: NOT DETECTED
Tetrahydrocannabinol: NOT DETECTED

## 2021-02-19 LAB — CBC
HCT: 40.9 % (ref 36.0–46.0)
Hemoglobin: 12.8 g/dL (ref 12.0–15.0)
MCH: 26.8 pg (ref 26.0–34.0)
MCHC: 31.3 g/dL (ref 30.0–36.0)
MCV: 85.6 fL (ref 80.0–100.0)
Platelets: 251 10*3/uL (ref 150–400)
RBC: 4.78 MIL/uL (ref 3.87–5.11)
RDW: 13.2 % (ref 11.5–15.5)
WBC: 8.3 10*3/uL (ref 4.0–10.5)
nRBC: 0 % (ref 0.0–0.2)

## 2021-02-19 LAB — DIFFERENTIAL
Abs Immature Granulocytes: 0.03 10*3/uL (ref 0.00–0.07)
Basophils Absolute: 0 10*3/uL (ref 0.0–0.1)
Basophils Relative: 0 %
Eosinophils Absolute: 0.1 10*3/uL (ref 0.0–0.5)
Eosinophils Relative: 1 %
Immature Granulocytes: 0 %
Lymphocytes Relative: 20 %
Lymphs Abs: 1.7 10*3/uL (ref 0.7–4.0)
Monocytes Absolute: 0.4 10*3/uL (ref 0.1–1.0)
Monocytes Relative: 5 %
Neutro Abs: 6.1 10*3/uL (ref 1.7–7.7)
Neutrophils Relative %: 74 %

## 2021-02-19 LAB — I-STAT BETA HCG BLOOD, ED (MC, WL, AP ONLY): I-stat hCG, quantitative: <5 m[IU]/mL (ref <5)

## 2021-02-19 LAB — COMPREHENSIVE METABOLIC PANEL
ALT: 19 U/L (ref 0–44)
AST: 18 U/L (ref 15–41)
Albumin: 3.7 g/dL (ref 3.5–5.0)
Alkaline Phosphatase: 74 U/L (ref 38–126)
Anion gap: 6 (ref 5–15)
BUN: 7 mg/dL (ref 6–20)
CO2: 29 mmol/L (ref 22–32)
Calcium: 8.8 mg/dL — ABNORMAL LOW (ref 8.9–10.3)
Chloride: 102 mmol/L (ref 98–111)
Creatinine, Ser: 0.83 mg/dL (ref 0.44–1.00)
GFR, Estimated: >60 mL/min (ref >60)
Glucose, Bld: 104 mg/dL — ABNORMAL HIGH (ref 70–99)
Potassium: 3.4 mmol/L — ABNORMAL LOW (ref 3.5–5.1)
Sodium: 137 mmol/L (ref 135–145)
Total Bilirubin: 0.3 mg/dL (ref 0.3–1.2)
Total Protein: 7.6 g/dL (ref 6.5–8.1)

## 2021-02-19 LAB — PROTIME-INR
INR: 1.1 (ref 0.8–1.2)
Prothrombin Time: 14.1 seconds (ref 11.4–15.2)

## 2021-02-19 LAB — URINALYSIS, ROUTINE W REFLEX MICROSCOPIC
Bilirubin Urine: NEGATIVE
Glucose, UA: NEGATIVE mg/dL
Hgb urine dipstick: NEGATIVE
Ketones, ur: NEGATIVE mg/dL
Leukocytes,Ua: NEGATIVE
Nitrite: NEGATIVE
Protein, ur: NEGATIVE mg/dL
Specific Gravity, Urine: 1.014 (ref 1.005–1.030)
pH: 7 (ref 5.0–8.0)

## 2021-02-19 LAB — LACTIC ACID, PLASMA: Lactic Acid, Venous: 2.4 mmol/L (ref 0.5–1.9)

## 2021-02-19 LAB — APTT: aPTT: 29 seconds (ref 24–36)

## 2021-02-19 LAB — CBG MONITORING, ED: Glucose-Capillary: 106 mg/dL — ABNORMAL HIGH (ref 70–99)

## 2021-02-19 IMAGING — DX DG CHEST 1V PORT
1 series · 1 of 1 positions shown · non-contrast
Comparison: [DATE]

CLINICAL DATA: Fever

EXAM:
PORTABLE CHEST 1 VIEW

[chest ap]
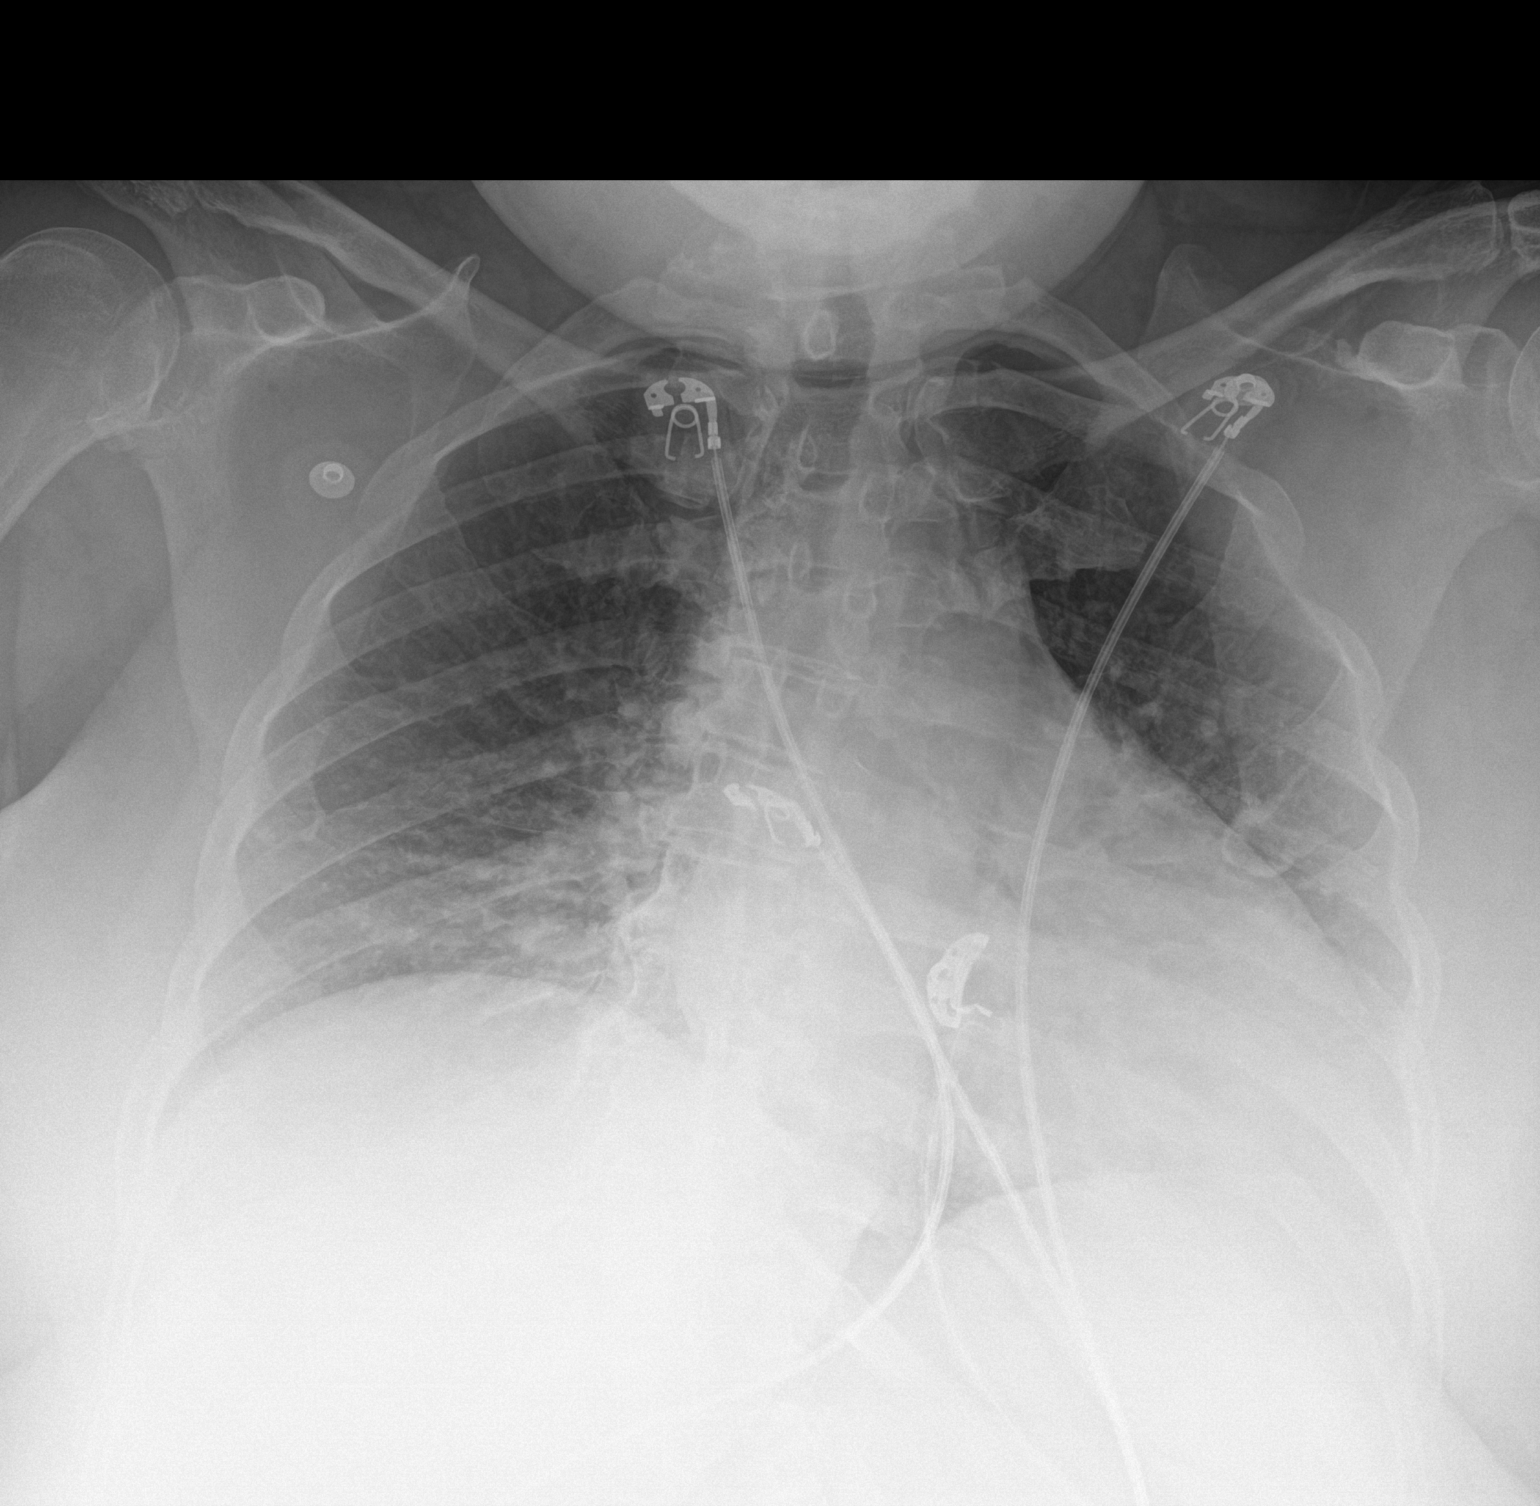

[1 of 1 positions shown; findings below may reference images not displayed]

FINDINGS: Cardiomegaly, vascular congestion. Perihilar opacities and
interstitial prominence may reflect early interstitial edema or
bronchitic changes. No effusions or acute bony abnormality.
IMPRESSION: Cardiomegaly, vascular congestion. Interstitial edema versus
bronchitic changes.

## 2021-02-19 IMAGING — MR MR HEAD W/O CM
10 of 11 series · 43 of 48 positions shown · non-contrast
Comparison: Noncontrast head CT performed earlier today [DATE].
Brain MRI [DATE].

CLINICAL DATA: Neuro deficit, acute, stroke suspected. Additional
history provided: Patient reports right-sided weakness, unable to
follow commands. History of stroke with no residual deficits.

EXAM:
MRI HEAD WITHOUT CONTRAST
TECHNIQUE: Multiplanar, multiecho pulse sequences of the brain and surrounding
structures were obtained without intravenous contrast.

[Series 5: DWI · axial · 3.0mm · 0.92mm/px · z∈[-113,+38]mm · 9 of 104 slices shown (1 of 4)]
[im 1/104]
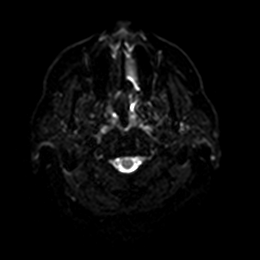
[im 13/104]
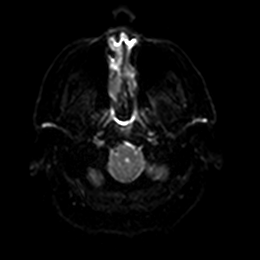
[im 26/104]
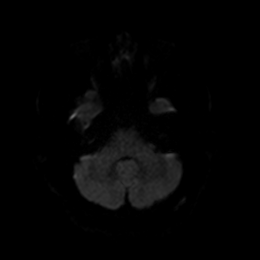
[im 39/104]
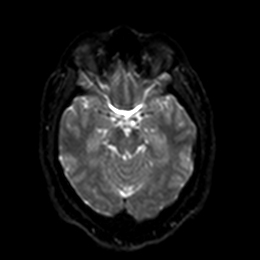
[im 52/104]
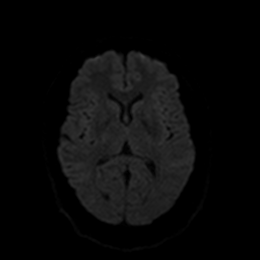
[im 65/104]
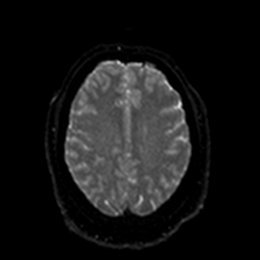
[im 78/104]
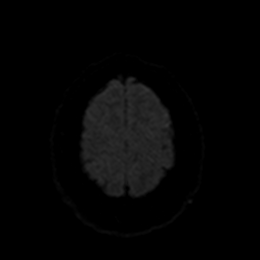
[im 91/104]
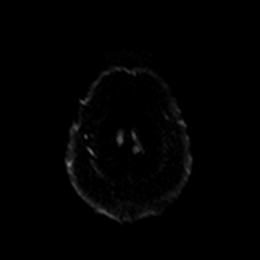
[im 104/104]
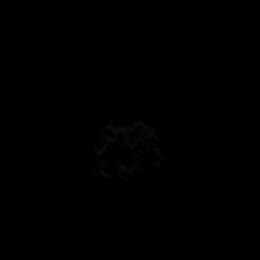

[Series 6: DWI · axial · 3.0mm · 0.92mm/px · z∈[-113,+38]mm · 5 of 52 slices shown (2 of 4)]
[im 1/52]
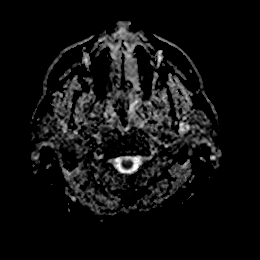
[im 13/52]
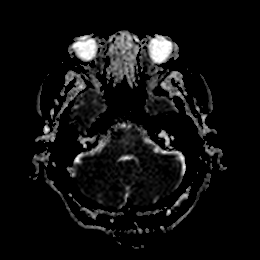
[im 26/52]
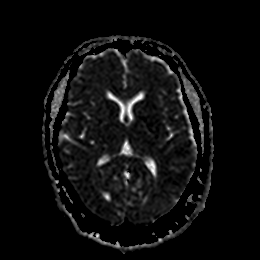
[im 39/52]
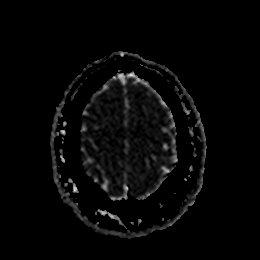
[im 52/52]
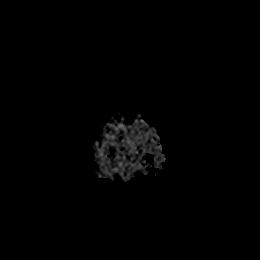

[Series 7: DWI · coronal · 4.0mm · 0.88mm/px · 7 of 72 slices shown (3 of 4)]
[im 1/72]
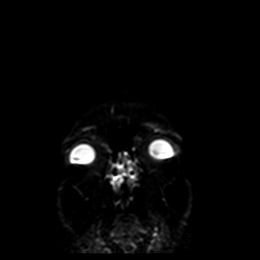
[im 12/72]
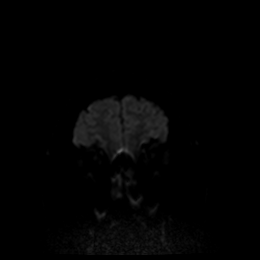
[im 24/72]
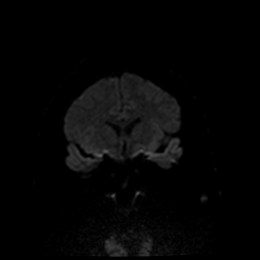
[im 36/72]
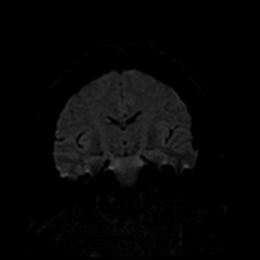
[im 48/72]
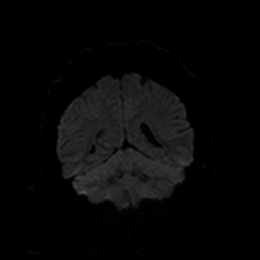
[im 60/72]
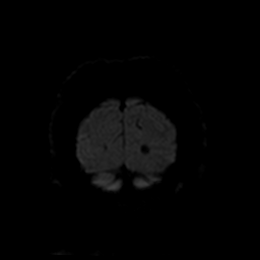
[im 72/72]
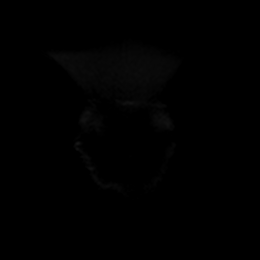

[Series 8: DWI · coronal · 4.0mm · 0.88mm/px · 3 of 36 slices shown (4 of 4)]
[im 1/36]
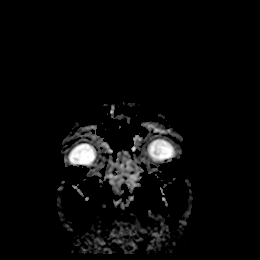
[im 18/36]
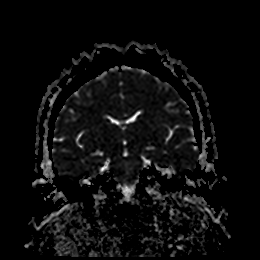
[im 36/36]
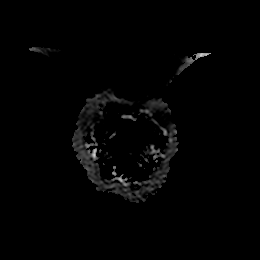

[Series 12: pha_images · axial · 3.0mm · 0.90mm/px · z∈[-112,+39]mm · 5 of 52 slices shown]
[im 1/52]
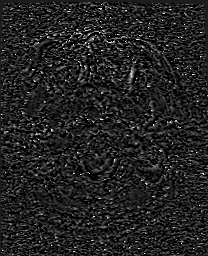
[im 13/52]
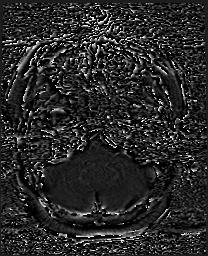
[im 26/52]
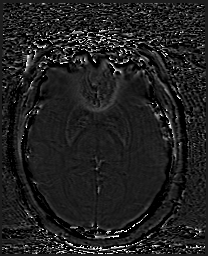
[im 39/52]
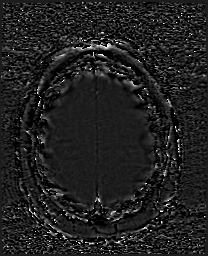
[im 52/52]
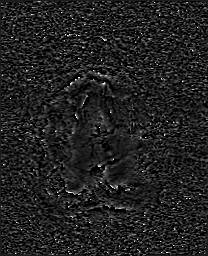

[Series 13: swi_images · axial · 3.0mm · 0.90mm/px · z∈[-112,+39]mm · 5 of 52 slices shown]
[im 1/52]
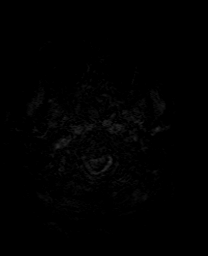
[im 13/52]
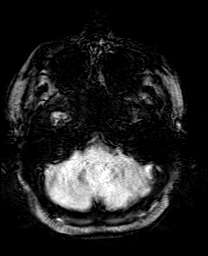
[im 26/52]
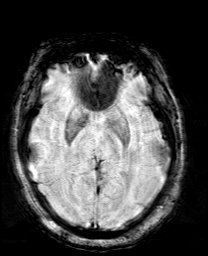
[im 39/52]
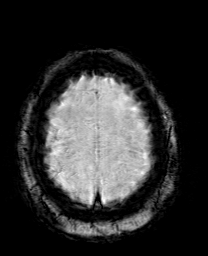
[im 52/52]
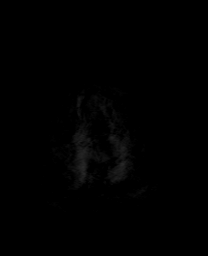

[Series 15: T1 · sagittal · 5.0mm · 0.75mm/px · 2 of 25 slices shown]
[im 1/25]
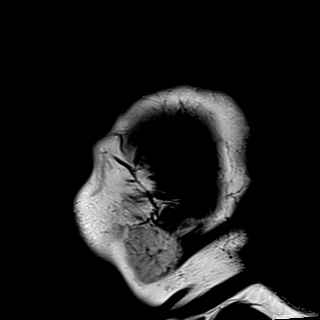
[im 25/25]
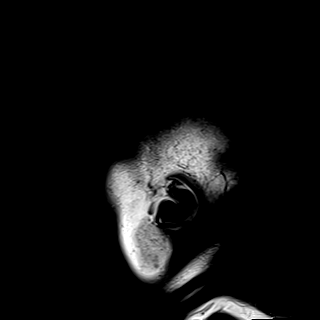

[Series 16: FLAIR · axial · 5.0mm · 0.90mm/px · z∈[-112,+30]mm · 2 of 25 slices shown]
[im 1/25]
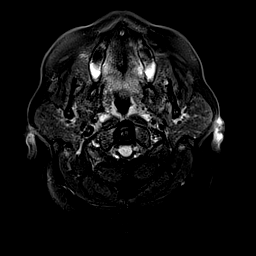
[im 25/25]
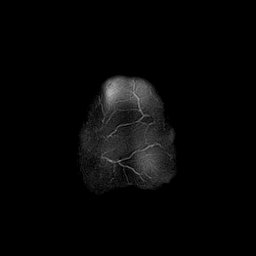

[Series 17: T2 · axial · 5.0mm · 0.72mm/px · z∈[-112,+30]mm · 2 of 25 slices shown (1 of 2)]
[im 1/25]
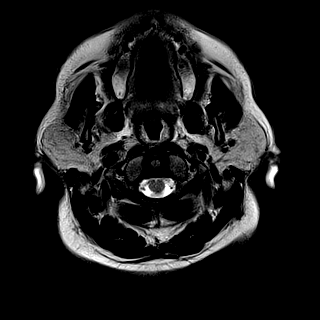
[im 25/25]
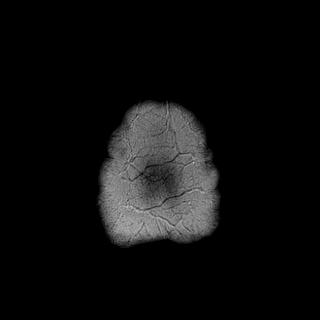

[Series 19: T2 · coronal · 5.0mm · 0.72mm/px · 3 of 30 slices shown (2 of 2)]
[im 1/30]
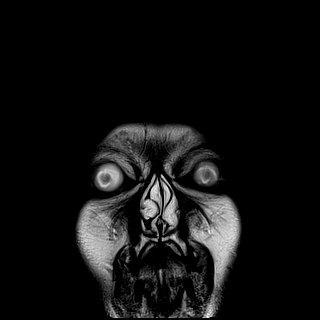
[im 15/30]
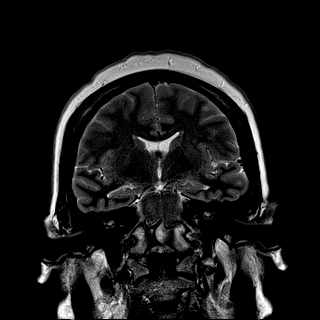
[im 30/30]
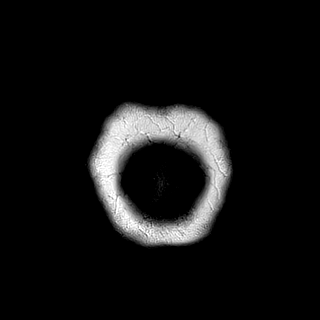

[43 of 48 positions shown; findings below may reference images not displayed]

FINDINGS: Brain:

Intermittently motion degraded examination. Most notably, there is
mild-to-moderate motion degradation of the coronal T2 TSE sequence.

Cerebral volume is normal.

Mild multifocal T2/FLAIR hyperintensity within the cerebral white
matter. Moderate T2/FLAIR hyperintense signal changes within the
pons. These findings are nonspecific, but most often secondary to
chronic small vessel ischemia.

Punctate chronic microhemorrhage within the right temporal lobe
(series 13, image 23)

As before, there is right greater than left cerebellar tonsillar
ectopia. The right cerebellar tonsil extends up to 7 mm below the
level foramen magnum.

There is no acute infarct.

No evidence of an intracranial mass.

No extra-axial fluid collection.

No midline shift.

Vascular: Expected proximal arterial flow voids.

Skull and upper cervical spine: No focal suspicious marrow lesion.

Sinuses/Orbits: Visualized orbits show no acute finding. Trace
bilateral ethmoid sinus mucosal thickening.
IMPRESSION: Intermittently motion degraded exam.

No evidence of acute infarction.

Redemonstrated right greater than left cerebellar tonsillar ectopia
(the right cerebellar tonsil extends 7 mm below level of foramen
magnum). This could reflect a mild Chiari 1 malformation.
Alternatively, cerebellar tonsillar ectopia may be seen in the
setting of idiopathic intracranial hypertension or intracranial
hypotension.

Stable T2/FLAIR hyperintense signal changes which are mild in the
cerebral white matter, and moderate in the pons. These findings are
nonspecific, but most often secondary to chronic small vessel
ischemia.

## 2021-02-19 IMAGING — CT CT HEAD CODE STROKE
4 series · 15 of 47 positions shown, 17 images · non-contrast
Comparison: [DATE]

CLINICAL DATA: Code stroke. Neuro deficit, acute, stroke suspected;
aphasia, right-sided weakness

EXAM:
CT HEAD WITHOUT CONTRAST
TECHNIQUE: Contiguous axial images were obtained from the base of the skull
through the vertex without intravenous contrast.

[Series 2: head wo · axial · 0.42mm/px · z∈[-169,-54]mm · 7 of 31 slices shown, 9 images]
[im 4/31  brain]
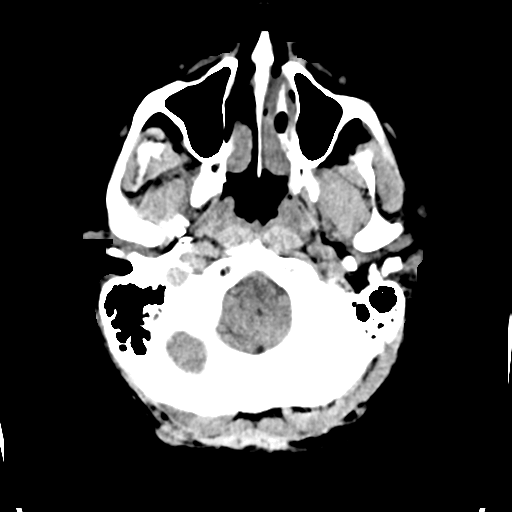
[im 4/31  bone]
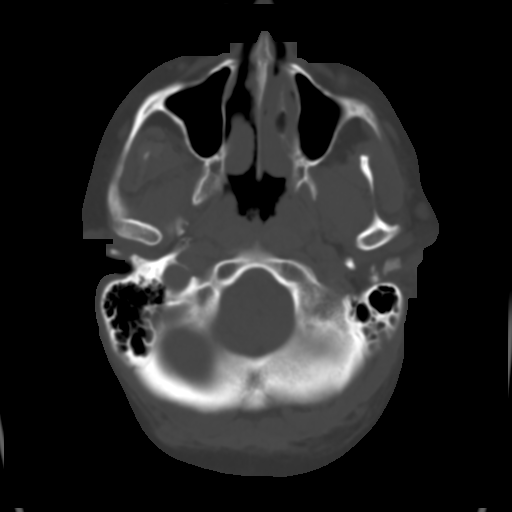
[im 8/31  brain]
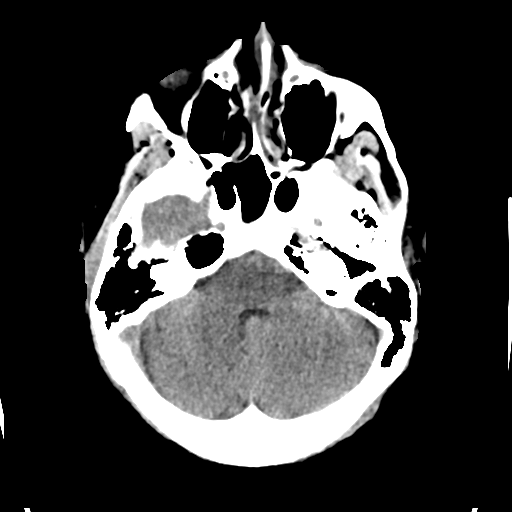
[im 12/31  brain]
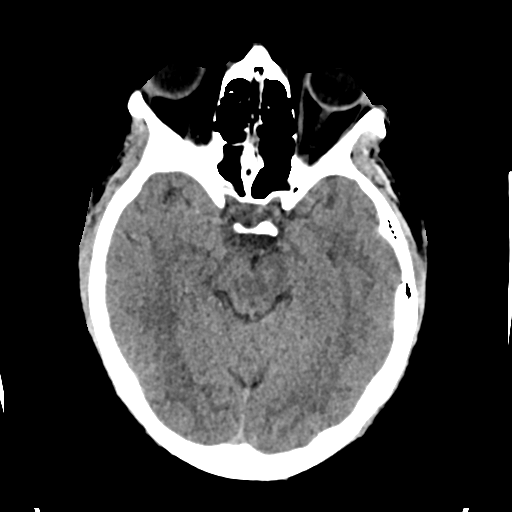
[im 16/31  brain]
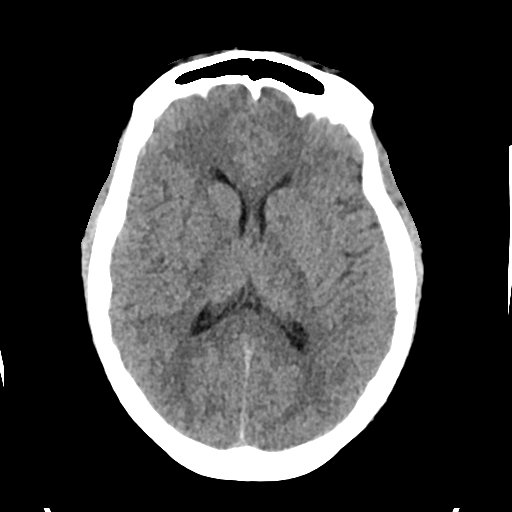
[im 19/31  brain]
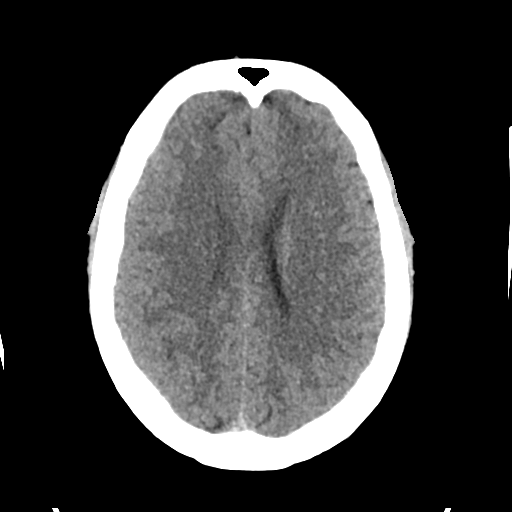
[im 19/31  bone]
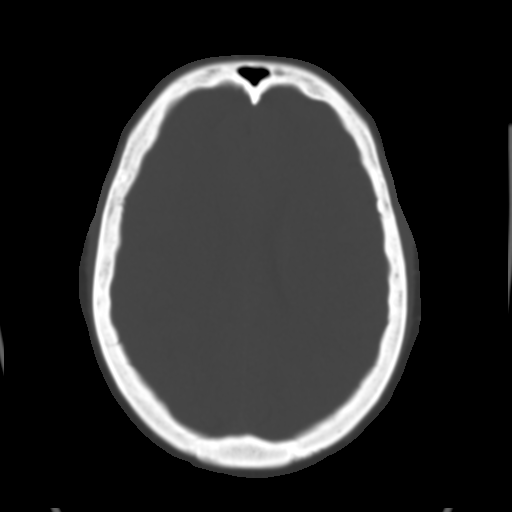
[im 23/31  brain]
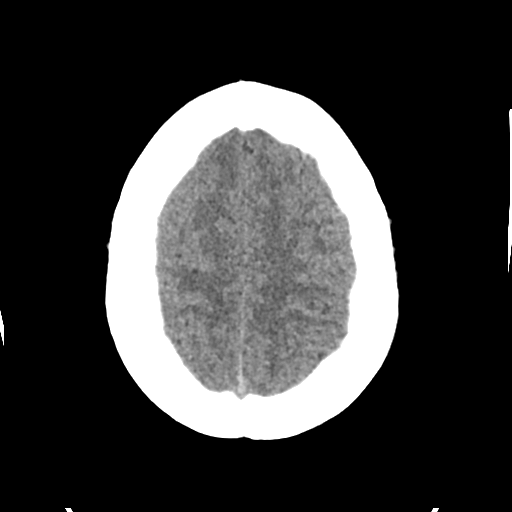
[im 27/31  brain]
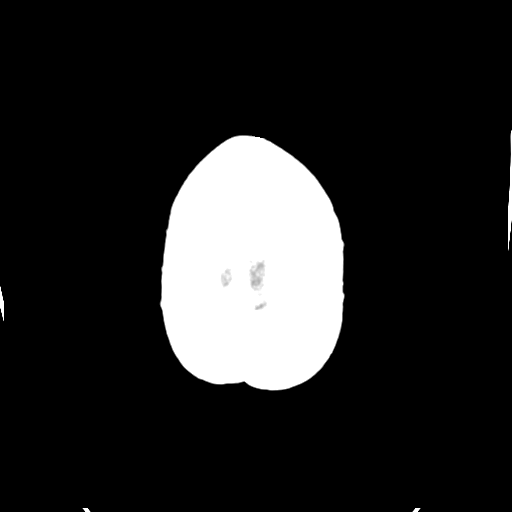

[Series 4: head bone · axial · 0.42mm/px · z∈[-170,-154]mm · 2 of 78 slices shown]
[im 8/78  bone]
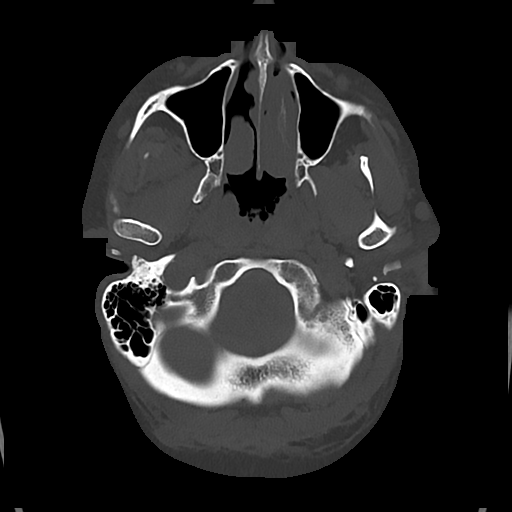
[im 16/78  bone]
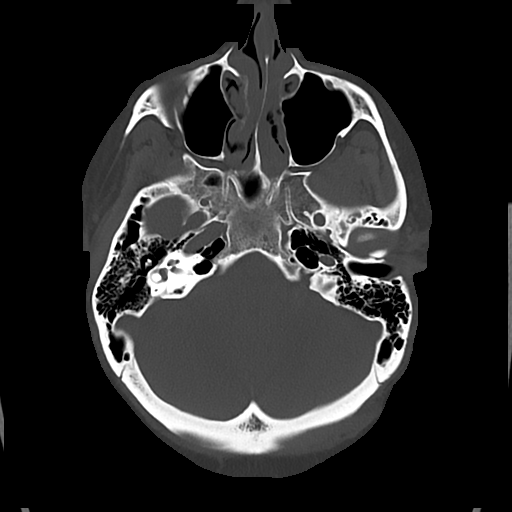

[Series 5: cor soft · coronal · 0.29mm/px · 3 of 72 slices shown]
[im 24/72  brain]
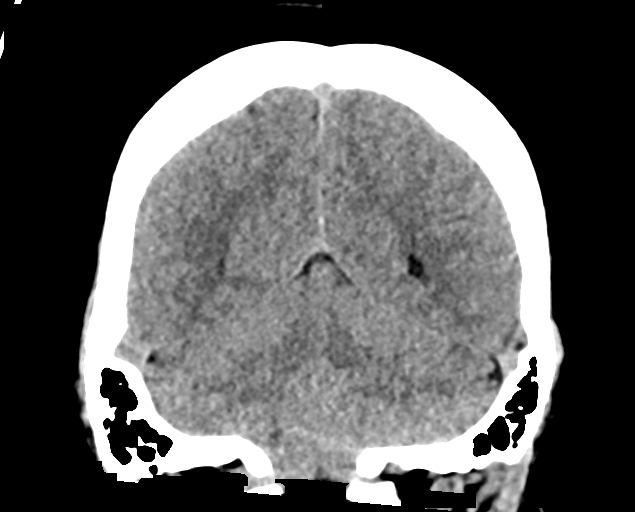
[im 32/72  brain]
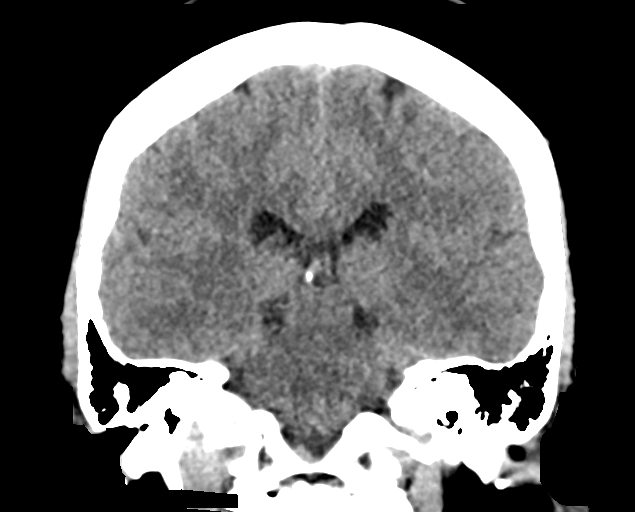
[im 40/72  brain]
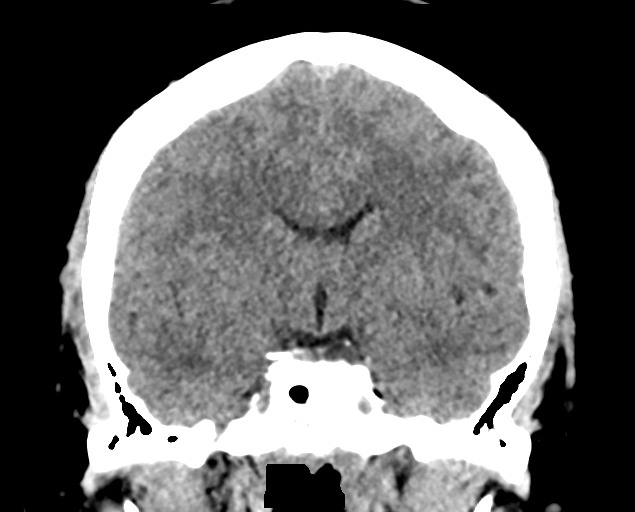

[Series 6: sag soft · sagittal · 0.27mm/px · 3 of 61 slices shown]
[im 21/61  brain]
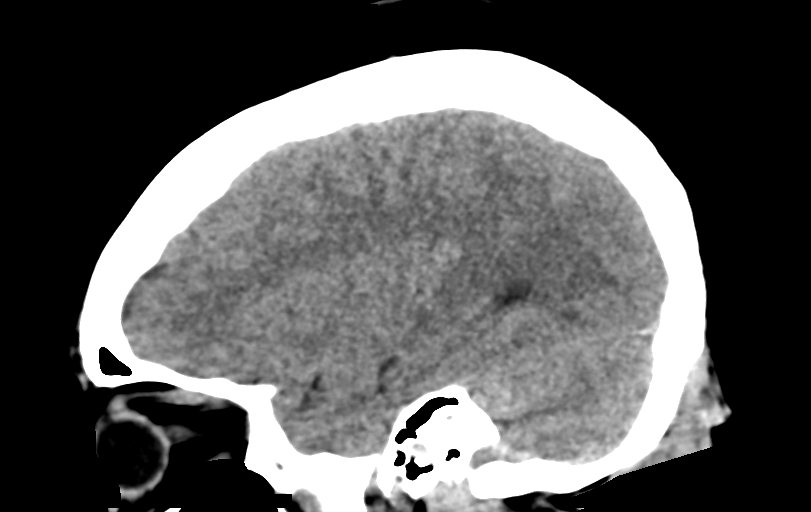
[im 31/61  brain]
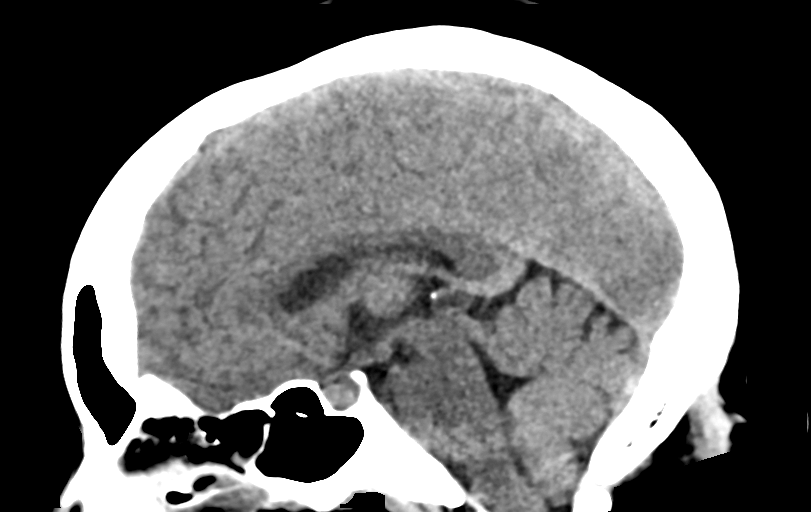
[im 41/61  brain]
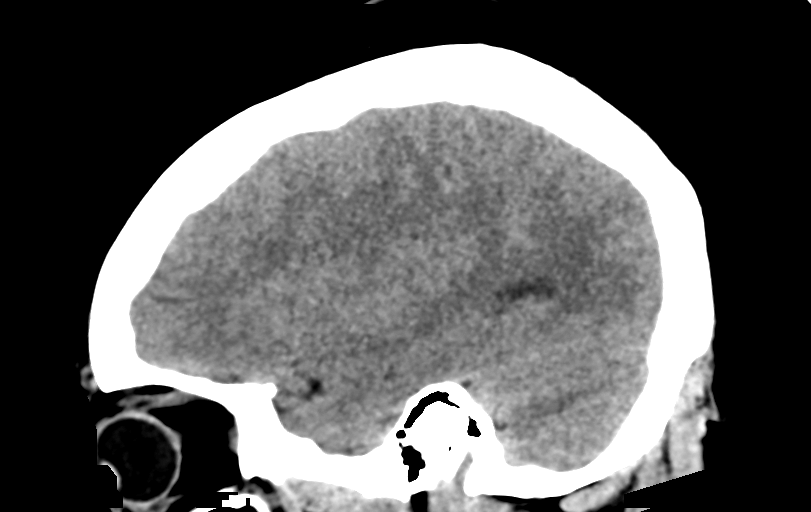

[15 of 47 positions shown; findings below may reference images not displayed]

FINDINGS: Brain: There is no acute intracranial hemorrhage, mass effect, or
edema. No new loss of gray-white differentiation. Ventricles and
sulci are normal in size and configuration. There is no extra-axial
collection. Stable low-lying cerebellar tonsils.

Vascular: No hyperdense vessel or unexpected calcification.

Skull: Unremarkable.

Sinuses/Orbits: No acute abnormality.

Other: Mastoid air cells are clear.

ASPECTS ([place] Stroke Program Early CT Score)

- Ganglionic level infarction (caudate, lentiform nuclei, internal
capsule, insula, M1-M3 cortex): 7

- Supraganglionic infarction (M4-M6 cortex): 3

Total score (0-10 with 10 being normal): 10
IMPRESSION: There is no acute intracranial hemorrhage or evidence of acute
infarction. ASPECT score is 10.

These results were communicated to Dr. VATEL at [DATE] on
[DATE] by text page via the [employer] messaging system.

## 2021-02-19 MED ORDER — DEXAMETHASONE SODIUM PHOSPHATE 10 MG/ML IJ SOLN: 0.1500 mg/kg | Freq: Four times a day (QID) | INTRAMUSCULAR | Status: DC

## 2021-02-19 MED ORDER — SODIUM CHLORIDE 0.9 % IV SOLN: 2.0000 g | INTRAVENOUS | Status: DC

## 2021-02-19 MED ORDER — SODIUM CHLORIDE 0.9% FLUSH
3.0000 mL | Freq: Once | INTRAVENOUS | Status: DC
Start: 2021-02-19 — End: 2021-02-21

## 2021-02-19 MED ORDER — TRAMADOL HCL 50 MG PO TABS
50.0000 mg | ORAL_TABLET | Freq: Two times a day (BID) | ORAL | Status: DC | PRN
  Administered 2021-02-20 (×2): 50 mg via ORAL
  Filled 2021-02-19 (×2): qty 1

## 2021-02-19 MED ORDER — ENOXAPARIN SODIUM 40 MG/0.4ML IJ SOSY: 40.0000 mg | PREFILLED_SYRINGE | INTRAMUSCULAR | Status: DC

## 2021-02-19 MED ORDER — LORAZEPAM 2 MG/ML IJ SOLN
1.0000 mg | Freq: Once | INTRAMUSCULAR | Status: AC
  Administered 2021-02-19: 1 mg via INTRAVENOUS
  Filled 2021-02-19: qty 1

## 2021-02-19 MED ORDER — ENOXAPARIN SODIUM 60 MG/0.6ML IJ SOSY
60.0000 mg | PREFILLED_SYRINGE | INTRAMUSCULAR | Status: DC
  Administered 2021-02-19: 60 mg via SUBCUTANEOUS
  Filled 2021-02-19: qty 0.6

## 2021-02-19 MED ORDER — ACETAMINOPHEN 500 MG PO TABS
1000.0000 mg | ORAL_TABLET | Freq: Once | ORAL | Status: AC
  Administered 2021-02-19: 1000 mg via ORAL
  Filled 2021-02-19: qty 2

## 2021-02-19 MED ORDER — SODIUM CHLORIDE 0.9 % IV BOLUS
1000.0000 mL | Freq: Once | INTRAVENOUS | Status: AC
  Administered 2021-02-19: 1000 mL via INTRAVENOUS

## 2021-02-19 MED ORDER — ACETAMINOPHEN 325 MG PO TABS
650.0000 mg | ORAL_TABLET | Freq: Four times a day (QID) | ORAL | Status: DC | PRN
  Administered 2021-02-20: 650 mg via ORAL
  Filled 2021-02-19: qty 2

## 2021-02-19 MED ORDER — SODIUM CHLORIDE 0.9 % IV SOLN: INTRAVENOUS | Status: DC

## 2021-02-19 MED ORDER — VANCOMYCIN HCL 10 G IV SOLR
2500.0000 mg | Freq: Once | INTRAVENOUS | Status: DC
  Filled 2021-02-19: qty 2500

## 2021-02-19 MED ORDER — SODIUM CHLORIDE 0.9 % IV SOLN: 1.0000 g | Freq: Four times a day (QID) | INTRAVENOUS | Status: DC

## 2021-02-19 MED ORDER — ASPIRIN EC 81 MG PO TBEC
81.0000 mg | DELAYED_RELEASE_TABLET | Freq: Every day | ORAL | Status: DC
  Administered 2021-02-19 – 2021-02-21 (×3): 81 mg via ORAL
  Filled 2021-02-19 (×3): qty 1

## 2021-02-19 NOTE — ED Notes (Signed)
Lactic acid of 2.4  Called in by lab

## 2021-02-19 NOTE — Plan of Care (Signed)
  Problem: Activity: Goal: Risk for activity intolerance will decrease Outcome: Progressing   Problem: Coping: Goal: Level of anxiety will decrease Outcome: Progressing   Problem: Pain Managment: Goal: General experience of comfort will improve Outcome: Progressing   Problem: Safety: Goal: Ability to remain free from injury will improve Outcome: Progressing   
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
## 2021-02-19 NOTE — Code Documentation (Addendum)
Patient from home with a witnessed onset of aphasia and weakness (right greater than the left) at 1330. [facility] was called and activated a code stroke. Pt was taken to [facility], met by the stroke team and cleared by EDP for CT. Pt was taken for scan. Results below. On assessment pt is slow to answer questions and is not consistent with her weakness or commands. She flails her arms and does react to pain bilaterally. Her NIHSS is 12 (see documentation for details). Pt was taken for MRI and then back to her room. Family at the bedside. CBG WNL, BP 165/111. Care Plan: q1hour vitals/mNIHSS. Hand off with Hennie Duos, RN.   "IMPRESSION: There is no acute intracranial hemorrhage or evidence of acute infarction. ASPECT score is 10.   MRI IMPRESSION:  Intermittently motion degraded exam.  No evidence of acute infarction."   Zaneta Lightcap, Rande Brunt, RN  Stroke Response Nurse

## 2021-02-19 NOTE — ED Triage Notes (Addendum)
Pt arrives to ED from [place] as a Code Stroke. Per EMS LKW at 1330. C/O Rt sided weakness, unable to speak or follow commands. Hx of Stroke with no deficits. Stroke Team at bridge upon pt's arrival.
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
## 2021-02-19 NOTE — Progress Notes (Signed)
FPTS Brief Progress Note  S Saw patient at bedside this evening in the ER. She reports a mild headache and wanted a blanket. No other concerns.  O: BP (!) 168/104   Pulse (!) 113   Temp (!) 101.4 F (38.6 C) (Oral)   Resp (!) 21   Ht '5\' 7"'$  (1.702 m)   Wt 134.4 kg   LMP 10/25/2018   SpO2 99%   BMI 46.41 kg/m    General: eyes closed Cardio: tachycardic  Pulm: normal work of breathing Extremities: No peripheral edema.  Neuro: Cranial nerves grossly intact, ANO x 4   A/P: Plan per day team and H&P  -A/w neuro recs -NPO until bed side swallow evaluation  -Monitor fever curve -Monitor respiratory status  -Continue mIVF -Orders reviewed. Labs for AM ordered, which was adjusted as needed.   Lattie Haw, MD 02/19/2021, 7:09 PM PGY-3, [facility] Night Resident  Please page [phone] with questions.

## 2021-02-19 NOTE — Hospital Course (Addendum)
   Discharge recommendations:  Recommend high intensity statin if LDL > 70. ASA '81mg'$  po qd given verbal admission of past stroke.  Goal A1c < 7.  Possible Chiari 1 malformation: MR head 7/29 found right greater than left cerebellar tonsillar ectopia (the right cerebellar tonsil extends 7 mm below level of foramen magnum). This could reflect a mild Chiari 1  malformation. Alternatively, cerebellar tonsillar ectopia may be seen in the setting of idiopathic intracranial hypertension or intracranial hypotension.

## 2021-02-19 NOTE — ED Notes (Signed)
IP RN unable to take report at this time.  Will call back for report when able

## 2021-02-19 NOTE — ED Notes (Signed)
Brother would like to be called with an update [phone]
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
## 2021-02-19 NOTE — Significant Event (Signed)
Family medicine teaching service will be admitting this patient. Our pager information can be located in the physician sticky notes, treatment team sticky notes, and the headers of all our official daily progress notes.   FAMILY MEDICINE TEACHING SERVICE Patient - Please contact intern pager [phone] or text page via website [email or web address] ([email or web address]: [email or web address]) for questions regarding care. DO NOT page listed attending provider unless there is no answer from the number above.   Marki Frede, MD PGY-2, [facility] Service pager [phone]   

## 2021-02-19 NOTE — ED Notes (Signed)
Daughter Kiomara Colchado [phone] would like an update

## 2021-02-20 DIAGNOSIS — R479 Unspecified speech disturbances: Secondary | ICD-10-CM

## 2021-02-20 DIAGNOSIS — R531 Weakness: Secondary | ICD-10-CM | POA: Diagnosis not present

## 2021-02-20 DIAGNOSIS — R509 Fever, unspecified: Secondary | ICD-10-CM

## 2021-02-20 LAB — LIPID PANEL
Cholesterol: 169 mg/dL (ref 0–200)
HDL: 39 mg/dL — ABNORMAL LOW (ref >40)
LDL Cholesterol: 122 mg/dL — ABNORMAL HIGH (ref 0–99)
Total CHOL/HDL Ratio: 4.3 RATIO
Triglycerides: 40 mg/dL (ref <150)
VLDL: 8 mg/dL (ref 0–40)

## 2021-02-20 LAB — COMPREHENSIVE METABOLIC PANEL
ALT: 19 U/L (ref 0–44)
AST: 15 U/L (ref 15–41)
Albumin: 3.5 g/dL (ref 3.5–5.0)
Alkaline Phosphatase: 66 U/L (ref 38–126)
Anion gap: 8 (ref 5–15)
BUN: 6 mg/dL (ref 6–20)
CO2: 25 mmol/L (ref 22–32)
Calcium: 8.4 mg/dL — ABNORMAL LOW (ref 8.9–10.3)
Chloride: 104 mmol/L (ref 98–111)
Creatinine, Ser: 0.77 mg/dL (ref 0.44–1.00)
GFR, Estimated: >60 mL/min (ref >60)
Glucose, Bld: 101 mg/dL — ABNORMAL HIGH (ref 70–99)
Potassium: 3.6 mmol/L (ref 3.5–5.1)
Sodium: 137 mmol/L (ref 135–145)
Total Bilirubin: 0.7 mg/dL (ref 0.3–1.2)
Total Protein: 7.3 g/dL (ref 6.5–8.1)

## 2021-02-20 LAB — CBC
HCT: 37.2 % (ref 36.0–46.0)
Hemoglobin: 11.9 g/dL — ABNORMAL LOW (ref 12.0–15.0)
MCH: 26.8 pg (ref 26.0–34.0)
MCHC: 32 g/dL (ref 30.0–36.0)
MCV: 83.8 fL (ref 80.0–100.0)
Platelets: 217 10*3/uL (ref 150–400)
RBC: 4.44 MIL/uL (ref 3.87–5.11)
RDW: 13.2 % (ref 11.5–15.5)
WBC: 6.8 10*3/uL (ref 4.0–10.5)
nRBC: 0 % (ref 0.0–0.2)

## 2021-02-20 LAB — PHOSPHORUS: Phosphorus: 3.4 mg/dL (ref 2.5–4.6)

## 2021-02-20 LAB — RPR: RPR Ser Ql: NONREACTIVE

## 2021-02-20 LAB — TSH: TSH: 0.468 u[IU]/mL (ref 0.350–4.500)

## 2021-02-20 LAB — AMMONIA: Ammonia: 36 umol/L — ABNORMAL HIGH (ref 9–35)

## 2021-02-20 LAB — MAGNESIUM: Magnesium: 1.7 mg/dL (ref 1.7–2.4)

## 2021-02-20 LAB — HEMOGLOBIN A1C
Hgb A1c MFr Bld: 6 % — ABNORMAL HIGH (ref 4.8–5.6)
Mean Plasma Glucose: 125.5 mg/dL

## 2021-02-20 LAB — FOLATE: Folate: 11.5 ng/mL (ref >5.9)

## 2021-02-20 LAB — VITAMIN B12: Vitamin B-12: 191 pg/mL (ref 180–914)

## 2021-02-20 LAB — HIV ANTIBODY (ROUTINE TESTING W REFLEX): HIV Screen 4th Generation wRfx: NONREACTIVE

## 2021-02-20 MED ORDER — ACETAMINOPHEN 325 MG PO TABS
650.0000 mg | ORAL_TABLET | Freq: Four times a day (QID) | ORAL | Status: DC
  Administered 2021-02-20 – 2021-02-21 (×4): 650 mg via ORAL
  Filled 2021-02-20 (×4): qty 2

## 2021-02-20 MED ORDER — ASPIRIN 81 MG PO TBEC
81.0000 mg | DELAYED_RELEASE_TABLET | Freq: Every day | ORAL | 11 refills | Status: DC
Start: 2021-02-21 — End: 2021-02-25

## 2021-02-20 MED ORDER — ATORVASTATIN CALCIUM 40 MG PO TABS: 40.0000 mg | ORAL_TABLET | Freq: Every day | ORAL | Status: DC

## 2021-02-20 MED ORDER — SALINE SPRAY 0.65 % NA SOLN
1.0000 | NASAL | Status: DC | PRN
  Administered 2021-02-21: 1 via NASAL
  Filled 2021-02-20: qty 44

## 2021-02-20 MED ORDER — ACETAMINOPHEN 325 MG PO TABS: 650.0000 mg | ORAL_TABLET | Freq: Four times a day (QID) | ORAL | Status: DC | PRN

## 2021-02-20 MED ORDER — GUAIFENESIN-DM 100-10 MG/5ML PO SYRP
5.0000 mL | ORAL_SOLUTION | ORAL | Status: DC | PRN
  Administered 2021-02-21 (×2): 5 mL via ORAL
  Filled 2021-02-20 (×2): qty 5

## 2021-02-20 MED ORDER — ENOXAPARIN SODIUM 80 MG/0.8ML IJ SOSY
65.0000 mg | PREFILLED_SYRINGE | INTRAMUSCULAR | Status: DC
  Administered 2021-02-20: 65 mg via SUBCUTANEOUS
  Filled 2021-02-20: qty 0.8

## 2021-02-20 NOTE — Progress Notes (Signed)
Patient was working with Phyical therapy and BP reached 183/113. Pt reported 10/10 pain. Meds have been given and MD aware of both extreme pain and high BP. RN will continue to monitor.

## 2021-02-20 NOTE — Evaluation (Signed)
Physical Therapy Evaluation Patient Details Name: Stacy Campos MRN: [id number] DOB: 23-Jun-1971 Today's Date: 02/20/2021   History of Present Illness  50 y/o female presented to ED on 7/29 for R sided weakness and aphasia. CT head and MRI negative. Patient inconsistent with presentation while in ED. Neurology suspects possible conversion disorder or functional response to stress/anxiety. Tested positive for COVID in ED. PMH: anxiety, HTN, PNA due to COVID 19 in 2020,  Clinical Impression  Patient admitted with above diagnosis. PTA, patient lives with sister and reports independence with mobility and currently working. Patient presents with generalized weakness, impaired balance, decreased activity tolerance. Evaluation limited due to elevated BP in supine and sitting in which OOB mobility was deferred. BP in supine 173/111 and in sitting 183/118. Patient perform bed mobility with supervision and increased time and effort. Patient will benefit from skilled PT services during acute stay to address listed deficits. Anticipate patient will progress quickly once BP is managed. At this time, recommend HHPT following discharge to maximize functional independence and safety in the home. Will continue to assess with next session.    Follow Up Recommendations Home health PT;Supervision/Assistance - 24 hour (Eval limited - will continue to assess mobility)    Equipment Recommendations  Rolling Denelda Akerley with 5" wheels    Recommendations for Other Services       Precautions / Restrictions Precautions Precautions: Fall Precaution Comments: watch BP Restrictions Weight Bearing Restrictions: No      Mobility  Bed Mobility Overal bed mobility: Needs Assistance Bed Mobility: Supine to Sit;Sit to Supine     Supine to sit: Supervision Sit to supine: Supervision   General bed mobility comments: supervision for safety, no physical assistance required. Increased time and effort    Transfers                  General transfer comment: deferred due to elevated BP. See General Comments  Ambulation/Gait             General Gait Details: deferred due to elevated BP  Stairs            Wheelchair Mobility    Modified Rankin (Stroke Patients Only)       Balance Overall balance assessment: Needs assistance Sitting-balance support: Single extremity supported;Feet supported Sitting balance-Leahy Scale: Fair                                       Pertinent Vitals/Pain Pain Assessment: Faces Faces Pain Scale: Hurts even more Pain Location: head Pain Descriptors / Indicators: Headache Pain Intervention(s): Limited activity within patient's tolerance;Monitored during session    Home Living Family/patient expects to be discharged to:: Private residence Living Arrangements: Other relatives (sister) Available Help at Discharge: Family;Available PRN/intermittently Type of Home: House Home Access: Stairs to enter Entrance Stairs-Rails: None Entrance Stairs-Number of Steps: 1 Home Layout: One level Home Equipment: None      Prior Function Level of Independence: Independent         Comments: driving, works in Special educational needs teacher        Extremity/Trunk Assessment   Upper Extremity Assessment Upper Extremity Assessment: Defer to OT evaluation    Lower Extremity Assessment Lower Extremity Assessment: Generalized weakness (strength equal bilaterally 4/5)    Cervical / Trunk Assessment Cervical / Trunk Assessment: Normal  Communication   Communication: No difficulties  Cognition Arousal/Alertness: Awake/alert Behavior During  Therapy: Flat affect Overall Cognitive Status: No family/caregiver present to determine baseline cognitive functioning                                 General Comments: following commands appropriately but with increased time. Flat affect throughout      General Comments General comments  (skin integrity, edema, etc.): Supine BP: 173/111 HR 99  Sitting BP: 183/118 HR 102    Exercises     Assessment/Plan    PT Assessment Patient needs continued PT services  PT Problem List Decreased strength;Decreased activity tolerance;Decreased balance;Decreased mobility;Decreased cognition;Decreased safety awareness       PT Treatment Interventions DME instruction;Gait training;Stair training;Functional mobility training;Therapeutic activities;Therapeutic exercise;Balance training;Patient/family education    PT Goals (Current goals can be found in the Care Plan section)  Acute Rehab PT Goals Patient Stated Goal: did not state PT Goal Formulation: With patient Time For Goal Achievement: 03/06/21 Potential to Achieve Goals: Good    Frequency Min 3X/week   Barriers to discharge        Co-evaluation               AM-PAC PT "6 Clicks" Mobility  Outcome Measure Help needed turning from your back to your side while in a flat bed without using bedrails?: A Little Help needed moving from lying on your back to sitting on the side of a flat bed without using bedrails?: A Little Help needed moving to and from a bed to a chair (including a wheelchair)?: A Lot Help needed standing up from a chair using your arms (e.g., wheelchair or bedside chair)?: A Lot Help needed to walk in hospital room?: A Lot Help needed climbing 3-5 steps with a railing? : A Lot 6 Click Score: 14    End of Session   Activity Tolerance: Treatment limited secondary to medical complications (Comment) (elevated BP) Patient left: in bed;with call bell/phone within reach;with bed alarm set Nurse Communication: Mobility status PT Visit Diagnosis: Muscle weakness (generalized) (M62.81)    Time: [id number] PT Time Calculation (min) (ACUTE ONLY): 18 min   Charges:   PT Evaluation $PT Eval Moderate Complexity: 1 Mod          Sai Moura A. Gilford Rile PT, DPT Acute Rehabilitation Services Pager  [phone] Office [phone]   Linna Hoff 02/20/2021, 10:34 AM

## 2021-02-20 NOTE — Evaluation (Signed)
Occupational Therapy Evaluation Patient Details Name: Stacy Campos MRN: [id number] DOB: [birth year] Today's Date: 02/20/2021    History of Present Illness 50 y/o female presented to ED on 7/29 for R sided weakness and aphasia. CT head and MRI negative. Patient inconsistent with presentation while in ED. Neurology suspects possible conversion disorder or functional response to stress/anxiety. Tested positive for COVID in ED. PMH: anxiety, HTN, PNA due to COVID 19 in 2020,   Clinical Impression   Patient admitted for the above diagnosis.  PTA she lives with her sister that can provide assist on an intermittent basis.  Barriers are listed below.  Of note: limited eval due to headache and elevated BP.  Inconsistent with MMT and observed R UE strength and AROM.  OT to continue with ADL assessment to assist with post acute recommendations.  OT to consider home health at this time.      Follow Up Recommendations  Home health OT    Equipment Recommendations  Tub/shower seat    Recommendations for Other Services       Precautions / Restrictions Precautions Precautions: Fall Precaution Comments: watch BP Restrictions Weight Bearing Restrictions: No      Mobility Bed Mobility Overal bed mobility: Needs Assistance Bed Mobility: Supine to Sit;Sit to Supine     Supine to sit: Supervision Sit to supine: Supervision   General bed mobility comments: supervision for safety, no physical assistance required. Increased time and effort    Transfers                 General transfer comment: deferred due to elevated BP. See General Comments    Balance Overall balance assessment: Needs assistance Sitting-balance support: Single extremity supported;Feet supported Sitting balance-Leahy Scale: Fair                                     ADL either performed or assessed with clinical judgement   ADL                                         General ADL  Comments: ADL to be assessed due to patient's increased BP and complaint of headach.  Not attempted.     Vision Baseline Vision/History: Wears glasses Wears Glasses: Distance only Patient Visual Report: No change from baseline       Perception     Praxis      Pertinent Vitals/Pain Pain Assessment: Faces Faces Pain Scale: Hurts little more Pain Location: head Pain Descriptors / Indicators: Headache Pain Intervention(s): Monitored during session     Hand Dominance Right   Extremity/Trunk Assessment Upper Extremity Assessment Upper Extremity Assessment: RUE deficits/detail RUE Deficits / Details: mixed MMT thoroughout session: formal MMT with catch and release with 2/5 grossly, but functionally when moving patient pusing and pulling with observed increased force. RUE Sensation: decreased light touch RUE Coordination: decreased fine motor;decreased gross motor   Lower Extremity Assessment Lower Extremity Assessment: Defer to PT evaluation   Cervical / Trunk Assessment Cervical / Trunk Assessment: Normal   Communication Communication Communication: No difficulties   Cognition Arousal/Alertness: Awake/alert Behavior During Therapy: Flat affect Overall Cognitive Status: No family/caregiver present to determine baseline cognitive functioning  General Comments: following commands appropriately but with increased time. Flat affect throughout   General Comments  Supine BP: 173/111 HR 99  Sitting BP: 183/118 HR 102    Exercises     Shoulder Instructions      Home Living Family/patient expects to be discharged to:: Private residence Living Arrangements: Other relatives Available Help at Discharge: Family;Available PRN/intermittently Type of Home: House Home Access: Stairs to enter Entrance Stairs-Number of Steps: 1 Entrance Stairs-Rails: None Home Layout: One level     Bathroom Shower/Tub: Radiographer, therapeutic: Standard     Home Equipment: None          Prior Functioning/Environment Level of Independence: Independent        Comments: driving, works in Management consultant Problem List: Decreased strength;Decreased range of motion;Impaired balance (sitting and/or standing);Pain;Impaired UE functional use      OT Treatment/Interventions: Self-care/ADL training;Therapeutic exercise;DME and/or AE instruction;Balance training;Therapeutic activities    OT Goals(Current goals can be found in the care plan section) Acute Rehab OT Goals Patient Stated Goal: did not state OT Goal Formulation: Patient unable to participate in goal setting Time For Goal Achievement: 03/06/21 Potential to Achieve Goals: Good ADL Goals Pt Will Perform Eating: Independently;sitting Pt Will Perform Grooming: Independently;sitting;standing Pt Will Perform Upper Body Bathing: Independently;sitting Pt Will Perform Upper Body Dressing: Independently;sitting Pt Will Transfer to Toilet: with modified independence;ambulating;regular height toilet Pt/caregiver will Perform Home Exercise Program: Increased strength;Right Upper extremity;With written HEP provided  OT Frequency: Min 2X/week   Barriers to D/C:    None noted       Co-evaluation PT/OT/SLP Co-Evaluation/Treatment: Yes Reason for Co-Treatment: Complexity of the patient's impairments (multi-system involvement);For patient/therapist safety   OT goals addressed during session: ADL's and self-care      AM-PAC OT "6 Clicks" Daily Activity     Outcome Measure Help from another person eating meals?: A Little Help from another person taking care of personal grooming?: A Little Help from another person toileting, which includes using toliet, bedpan, or urinal?: A Little Help from another person bathing (including washing, rinsing, drying)?: A Lot Help from another person to put on and taking off regular upper body clothing?: A Little Help from another  person to put on and taking off regular lower body clothing?: A Lot 6 Click Score: 16   End of Session Nurse Communication: Other (comment) (BP results)  Activity Tolerance: Patient limited by pain;Other (comment) (Limited by BP) Patient left: in bed;with call bell/phone within reach  OT Visit Diagnosis: Unsteadiness on feet (R26.81);Muscle weakness (generalized) (M62.81);Pain                Time: [id number] OT Time Calculation (min): 18 min Charges:  OT General Charges $OT Visit: 1 Visit OT Evaluation $OT Eval Moderate Complexity: 1 Mod  02/20/2021  Rich, OTR/L  Acute Rehabilitation Services  Office:  [phone]   Metta Clines 02/20/2021, 10:56 AM

## 2021-02-20 NOTE — Progress Notes (Signed)
Paged by nursing about patient elevated BP. Home medication metoprolol 25 mg TID held due to concern for stroke. Head imaging negative for acute hemorraghic or embolic stroke. Asked to recheck manual being that last BP was at 2000. Will follow up BP check and consider restarting metoprolol.   Gerlene Fee, DO 02/20/2021, 11:49 PM PGY-3, [place]

## 2021-02-20 NOTE — Discharge Summary (Deleted)
[facility] Discharge Summary  Patient name: Stacy Campos Medical record number: [id number] Date of birth: [birth year] Age: 50 y.o. Gender: female Date of Admission: 02/19/2021  Date of Discharge: 02/20/2021 Admitting Physician: Alen Bleacher, MD  Primary Care Provider: Lattie Haw, MD Consultants: Neurology  Indication for Hospitalization: Weakness  Discharge Diagnoses/Problem List:  Right-sided weakness COVID-19 positive  Disposition: Home  Discharge Condition: Stable  Discharge Exam: General: Alert and oriented in no apparent distress Heart: Regular rate and rhythm with no murmurs appreciated Lungs: Some faint crackles present in the bilateral bases, no respiratory distress on room air Abdomen: Bowel sounds present, no abdominal pain Neuro: CN II through XII intact, fine touch sensation intact in upper and lower extremities bilaterally, strength 4/5 on the right arm and leg, 5/5 on the left though I question whether this is due to patient compliance as the patient was able to use both sides equally to help her sit up when I was trying to auscultate her lungs Extremities: No lower extremity edema   Brief Hospital Course:  Stacy Campos is a 50 y.o. female presenting to the ED with right side hemiparesis and aphasia . PMH is significant for HTN, Hydradenitis and Carpel Tunnel.   Right-sided hemiparesis and aphasia-improved/resolved Presented to the ED with right-sided hemiparesis and aphasia.  This resolved the next morning with the patient stating that her strength has mostly gotten back to normal.  Neurology was consulted in the emergency department.  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  Neurology recommended that if her weakness improved she could be discharged home after  negative MRI.  It was thought that her right-sided hemiparesis and aphasia were due to conversion disorder versus malingering versus other etiology.  Neurology recommended labs which showed TSH within normal limits, B12 within normal limits, folate within normal limits.  LDL elevated at 122, HDL 39.  COVID-19 positive/fever/tachycardia Patient remained on room air throughout the hospitalization.  Her O2 sat remained appropriate throughout this.  She did have some complaints of myalgias and headache which were treated with acetaminophen and a dose of tramadol.  At the time of discharge patient was satting well on room air and was given return precautions.   Discharge recommendations:  Recommend high intensity statin as LDL > 70. ASA '81mg'$  po qd given verbal admission of past stroke.  Goal A1c < 7.  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 This could reflect a mild Chiari 1  malformation. Alternatively, cerebellar tonsillar ectopia may be seen in the setting of idiopathic intracranial hypertension or intracranial hypotension.    Significant Procedures: None  Significant Labs and Imaging:  Recent Labs  Lab 02/19/21 1511 02/19/21 1521 02/20/21 0630  WBC 8.3  --  6.8  HGB 12.8 13.9 11.9*  HCT 40.9 41.0 37.2  PLT 251  --  217   Recent Labs  Lab 02/19/21 1511 02/19/21 1521 02/20/21 0311  NA 137 140 137  K 3.4* 3.5 3.6  CL 102 100 104  CO2 29  --  25  GLUCOSE 104* 105* 101*  BUN '7 7 6  '$ CREATININE 0.83 0.80 0.77  CALCIUM 8.8*  --  8.4*  MG  --   --  1.7  PHOS  --   --  3.4  ALKPHOS 74  --  66  AST 18  --  15  ALT 19  --  19  ALBUMIN 3.7  --  3.5    Results/Tests Pending at Time of Discharge: None  Discharge Medications:  Allergies as of 02/20/2021       Reactions   Valsartan Swelling   Onion Diarrhea   White onions cause  severe diarrhea   Dimetapp [albertsons Di Bromm] Hives        Medication List     STOP taking these medications    CINNAMON PO   ELDERBERRY PO   ibuprofen 600 MG tablet Commonly known as: ADVIL   mupirocin ointment 2 % Commonly known as: BACTROBAN   naproxen sodium 220 MG tablet Commonly known as: ALEVE   rifampin 300 MG capsule Commonly known as: RIFADIN   TURMERIC PO   valACYclovir 1000 MG tablet Commonly known as: VALTREX       TAKE these medications    acetaminophen 325 MG tablet Commonly known as: TYLENOL Take 2 tablets (650 mg total) by mouth every 6 (six) hours as needed for mild pain.   albuterol 108 (90 Base) MCG/ACT inhaler Commonly known as: VENTOLIN HFA Inhale 2 puffs into the lungs every 6 (six) hours as needed for wheezing or shortness of breath.   aspirin 81 MG EC tablet Take 1 tablet (81 mg total) by mouth daily. Swallow whole. Start taking on: February 21, 2021   chlorthalidone 25 MG tablet Commonly known as: HYGROTON Take 1 tablet (25 mg total) by mouth daily.   GABAPENTIN PO Take by mouth.   metoprolol succinate 25 MG 24 hr tablet Commonly known as: TOPROL-XL Take 1 tablet (25 mg total) by mouth 3 (three) times daily with meals.   multivitamin with minerals Tabs tablet Take 1 tablet by mouth daily.               Durable Medical Equipment  (From admission, onward)           Start     Ordered   02/20/21 1245  DME Shower stool  Once        02/20/21 1244   02/20/21 1244  DME Walker  Once       Question Answer Comment  Walker: With 5 Inch Wheels   Patient needs a walker to treat with the following condition Weakness      02/20/21 1244            Discharge Instructions: Please refer to Patient Instructions section of EMR for full details.  Patient was counseled important signs and symptoms that should prompt return to medical care, changes in medications, dietary instructions, activity restrictions, and follow up  appointments.   Follow-Up Appointments:   Lurline Del, DO 02/20/2021, 1:01 PM PGY-3, [place]
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
## 2021-02-20 NOTE — Discharge Instructions (Addendum)
If you begin feeling more ill, have trouble breathing, chest pains, or other concerns due to your COVID-19 infection please seek help immediately.  You can take Tylenol and your daily aspirin for headaches or muscle pains.  Your cholesterol was checked during this hospitalization and was a little bit elevated.  I recommend that you talk with your primary care doctor on whether you should start a medicine called a statin to lower your cholesterol.  We are discharging you on a daily aspirin.  You should have outpatient follow-up with your neurologist.  We are also ordering physical therapy and Occupational Therapy for you.

## 2021-02-20 NOTE — Progress Notes (Signed)
Neurology Progress Note  Brief HPI: 50 y.o. female with PMHx of possible CVA without sequelae (no supporting documentation found on chart review, no chronic infarcts identified on MRI), anxiety, HTN, HA, and PNA 2/2 COVID-19 infection in October 2020 who presented to the ED 7/29 for evaluation of right sided weakness and aphasia at home. Assessment on arrival revealed giveway weakness throughout without right-sided asymmetry and CT and MRI imaging were negative for acute infarct. She was found to be positive for COVID on arrival.   Subjective: Patient febrile overnight with Tmax of 103 degrees Fahrenheit  Ongoing complaints of headache with 7/10 severity, frontal, with a band-like quality Endorses being out of her antihypertensive medications at home for 3-4 weeks. Believes hypertension may be contributing to her headaches.  Complaints of neck pain with limited ROM to neck with pain on palpation of the left and midline neck.   Exam: Vitals:   02/20/21 0429 02/20/21 1015  BP:  (!) 168/120  Pulse:  95  Resp:  20  Temp: 98.9 F (37.2 C) 98.6 F (37 C)  SpO2:  99%   Gen: Asleep, laying in bed, in no acute distress Resp: non-labored breathing, no acute respiratory distress, on room air with SpO2 of 98% during assessment Abd: soft, rounded, non-distended, non-tender  Neuro: Mental Status: Asleep, awakes to voice, alert and oriented to self, age, year, location, and situation. She incorrectly states that the month is August. She is able to provide a clear and coherent history of present illness. She states "I was brought to the hospital because I could not move my body". Speech is intact without dysarthria.  Naming, fluency, and comprehension are intact. Repetition with 2-3 errors consistently. No aphasia noted. No neglect noted.  Cranial Nerves: PERRL, EOMI without ptosis or nystagmus, visual fields are full, facial sensation intact and symmetric to light touch, face is symmetric resting and  with movement, hearing is intact to voice, phonation intact, palate elevates symmetrically, shoulders shrug symmetrically, tongue protrudes midline.  Motor: Giveway weakness of the right upper extremity apparent on examination with a functional drift of the right upper extremity. Left upper and lower extremities with 5/5 strength. Right lower extremity with 4/5 strength with giveway quality to weakness.  Tone and bulk are normal.  Sensory: Decreased sensation to light touch reported on the right upper and lower extremities compared to the left.  Plantars: Toes downgoing bilaterally Gait: Deferred  Pertinent Labs: CBC    Component Value Date/Time   WBC 6.8 02/20/2021 0630   RBC 4.44 02/20/2021 0630   HGB 11.9 (L) 02/20/2021 0630   HGB 12.4 05/13/2020 1108   HCT 37.2 02/20/2021 0630   HCT 38.0 05/13/2020 1108   PLT 217 02/20/2021 0630   PLT 260 05/13/2020 1108   MCV 83.8 02/20/2021 0630   MCV 82 05/13/2020 1108   MCH 26.8 02/20/2021 0630   MCHC 32.0 02/20/2021 0630   RDW 13.2 02/20/2021 0630   RDW 13.3 05/13/2020 1108   LYMPHSABS 1.7 02/19/2021 1511   LYMPHSABS 2.8 05/20/2019 1109   MONOABS 0.4 02/19/2021 1511   EOSABS 0.1 02/19/2021 1511   EOSABS 0.4 05/20/2019 1109   BASOSABS 0.0 02/19/2021 1511   BASOSABS 0.0 05/20/2019 1109   CMP     Component Value Date/Time   NA 137 02/20/2021 0311   NA 139 05/13/2020 1108   K 3.6 02/20/2021 0311   CL 104 02/20/2021 0311   CO2 25 02/20/2021 0311   GLUCOSE 101 (H) 02/20/2021 [id number]  BUN 6 02/20/2021 0311   BUN 9 05/13/2020 1108   CREATININE 0.77 02/20/2021 0311   CREATININE 0.70 06/08/2016 1609   CALCIUM 8.4 (L) 02/20/2021 0311   PROT 7.3 02/20/2021 0311   PROT 7.1 05/20/2019 1109   ALBUMIN 3.5 02/20/2021 0311   ALBUMIN 3.9 05/20/2019 1109   AST 15 02/20/2021 0311   ALT 19 02/20/2021 0311   ALKPHOS 66 02/20/2021 0311   BILITOT 0.7 02/20/2021 0311   BILITOT 0.3 05/20/2019 1109   GFRNONAA >60 02/20/2021 0311   GFRNONAA >89  06/08/2016 1609   GFRAA 98 05/13/2020 1108   GFRAA >89 06/08/2016 1609   Results for orders placed or performed during the hospital encounter of 02/19/21  Resp Panel by RT-PCR (Flu A&B, Covid) Nasopharyngeal Swab     Status: Abnormal   Collection Time: 02/19/21  5:16 PM   Specimen: Nasopharyngeal Swab; Nasopharyngeal(NP) swabs in vial transport medium  Result Value Ref Range Status   SARS Coronavirus 2 by RT PCR POSITIVE (A) NEGATIVE Final    Comment: RESULT CALLED TO, READ BACK BY AND VERIFIED WITH: ALBERTO C '@1918'$  02/19/21 EB (NOTE) SARS-CoV-2 target nucleic acids are DETECTED.  The SARS-CoV-2 RNA is generally detectable in upper respiratory specimens during the acute phase of infection. Positive results are indicative of the presence of the identified virus, but do not rule out bacterial infection or co-infection with other pathogens not detected by the test. Clinical correlation with patient history and other diagnostic information is necessary to determine patient infection status. The expected result is Negative.  Fact Sheet for Patients: EntrepreneurPulse.com.au  Fact Sheet for Healthcare Providers: IncredibleEmployment.be  This test is not yet approved or cleared by the [place] FDA and  has been authorized for detection and/or diagnosis of SARS-CoV-2 by FDA under an Emergency Use Authorization (EUA).  This EUA will remain in effect (meaning this test can be used)  for the duration of  the COVID-19 declaration under Section 564(b)(1) of the Act, 21 U.S.C. section 360bbb-3(b)(1), unless the authorization is terminated or revoked sooner.     Influenza A by PCR NEGATIVE NEGATIVE Final   Influenza B by PCR NEGATIVE NEGATIVE Final    Comment: (NOTE) The Xpert Xpress SARS-CoV-2/FLU/RSV plus assay is intended as an aid in the diagnosis of influenza from Nasopharyngeal swab specimens and should not be used as a sole basis for  treatment. Nasal washings and aspirates are unacceptable for Xpert Xpress SARS-CoV-2/FLU/RSV testing.  Fact Sheet for Patients: EntrepreneurPulse.com.au  Fact Sheet for Healthcare Providers: IncredibleEmployment.be  This test is not yet approved or cleared by the [place] FDA and has been authorized for detection and/or diagnosis of SARS-CoV-2 by FDA under an Emergency Use Authorization (EUA). This EUA will remain in effect (meaning this test can be used) for the duration of the COVID-19 declaration under Section 564(b)(1) of the Act, 21 U.S.C. section 360bbb-3(b)(1), unless the authorization is terminated or revoked.  Performed at [facility], [place] [address] [place], [place] [address]    Lab Results  Component Value Date   HGBA1C 6.0 (H) 02/20/2021   Lab Results  Component Value Date   CHOL 169 02/20/2021   HDL 39 (L) 02/20/2021   LDLCALC 122 (H) 02/20/2021   TRIG 40 02/20/2021   CHOLHDL 4.3 02/20/2021   Imaging Reviewed:  CT Head Code Stroke 02/19/2021: There is no acute intracranial hemorrhage or evidence of acute infarction. ASPECT score is 10.  MRI Brain without contrast 02/19/2021: - Intermittently motion degraded  exam. - No evidence of acute infarction. 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 Alternatively, cerebellar tonsillar ectopia may be seen in the setting of idiopathic intracranial hypertension or intracranial hypotension. - Stable T2/FLAIR hyperintense signal changes which are mild in the cerebral white matter, and moderate in the pons. These findings are nonspecific, but most often secondary to chronic small vessel ischemia.   Assessment: 50 yo female with stroke risk factors of possible prior stroke, chronic small vessel ischemia on old MRI brain, remote  tobacco abuse, morbid obesity and HTN who presented for evaluation of atypical pattern of aphasia in conjunction with inconsistent focal weakness with a giveway component  - Initial exam concerning for giveway weakness, inconsistent effort, with a non-physiological speech pattern exhibiting a pattern most consistent with embellished/functional aphasia.  - Repeat examination with further evidence of giveway weakness on the right upper and lower extremity without further speech deficits.  - CTH and MRI brain negative for acute finding. 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 Due to low suspicion of stroke, tPA was not offered. Due to low suspicion of LVO, CTA head and neck were not performed. MRI head revealed grossly patent flow voids. - Attending providers with concern for possible meningitis overnight due to patient febrile with a 24h temperature maximum of 103 degrees Farenheit and complaints of neck pain with palpation and ROM from patient. CBC is without leukocytosis and patient was found to be COVID positive as an etiology for fever and myalgias. Presentation with inconsistent and varying degrees and locations of weakness is not felt likely to be related to meningitis. Patient with c/o headache but states she ran out of her blood pressure medication 3-4 weeks ago at home with persistent hypertension. The etiology of her fever is most likely related to active Covid infection.   - There is high suspicion that her symptoms are functional with DDx including conversion disorder, factitious disorder and malingering. Febrile state with myalgia and headaches likely related to active COVID infection identified on hospital arrival.    Impression:  - Various degrees and locations of weakness with functional exam findings. Stroke is unlikely given her presentation and negative imaging findings. -Possible conversion disorder or  functional response to stress/anxiety. - Active COVID infection  - Persistent headaches likely 2/2 hypertension and/or active COVID infection  Recommendations: - Initiate statin therapy due to patient with stroke risk factors and LDL > 70 - Continue ASA 81 mg PO daily for stroke prevention  - Management of co-morbidities per primary team - Neurology will sign off. Please call if there are additional questions.   Anibal Henderson, AGACNP-BC [facility] [phone]  Electronically signed: Dr. Kerney Elbe

## 2021-02-20 NOTE — Progress Notes (Addendum)
Family Medicine Teaching Service Daily Progress Note Intern Pager: [phone]  Patient name: Stacy Campos Medical record number: [id number] Date of birth: 04-04-1971 Age: 50 y.o. Gender: female  Primary Care Provider: Lattie Haw, MD Consultants: Neurology Code Status: Full  Pt Overview and Major Events to Date:  7/29-admit  Assessment and Plan: Stacy Campos is a 50 y.o. female presenting to the ED with Right side hemiparesis and aphasia . PMH is significant for HTN, Hydradenitis and Carpel Tunnel.  Right-sided hemiparesis and aphasia-improved/resolved This morning she states that her strength has mostly gotten back to normal.  Patient with very mild decrease in strength on the right side compared to the left but I question if this is due to patient compliance as she was able to use that side to help her set up for me to listen to her lungs.  A.m. labs show TSH within normal limits, B12 within normal limits, folate within normal limits.  LDL elevated at 122, HDL 39. 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  Neurology was consulted in the ED and recommended that if her weakness improved she could be discharged home after negative MRI -PT/OT -Continue cardiac monitoring -Consider starting atorvastatin due to LDL of 122 at follow up -Can possibly discharge home today versus tomorrow pending evaluation by PT/OT  COVID-positive  fever  tachycardia Most recent fever at 3:30 AM this morning of 100.7.  Continues on room air.  Physical exam significant for only fine crackles in bilateral bases with no respiratory distress -Continuous pulse ox -Cardiac monitoring   FEN/GI: Regular diet PPx: Lovenox Dispo:Pending PT recommendations   today versus tomorrow . Barriers include continued medical work-up.   Subjective:   Patient slow to respond to questions but is alert and oriented to person, place, time.  She she states her weakness on the right side has improved.  Her main complaint is myalgias and headache this morning.  When asked if she felt like potentially getting out of the hospital today she says she does not think she can go home while her hips hurt and she has a headache.  She states that she lives with her sister who helps care for her.  Objective: Temp:  [98.2 F (36.8 C)-103 F (39.4 C)] 98.9 F (37.2 C) (07/30 0429) Pulse Rate:  [99-115] 99 (07/30 0335) Resp:  [12-29] 20 (07/30 0335) BP: (123-182)/(74-111) 150/91 (07/30 0335) SpO2:  [93 %-99 %] 99 % (07/30 0335) Weight:  [134.4 kg] 134.4 kg (07/29 1553) Physical Exam: General: Alert and oriented in no apparent distress Heart: Regular rate and rhythm with no murmurs appreciated Lungs: Some faint crackles present in the bilateral bases, no respiratory distress on room air Abdomen: Bowel sounds present, no abdominal pain Neuro: CN II through XII intact, fine touch sensation intact in upper and lower extremities bilaterally, strength 4/5 on the right arm and leg, 5/5 on the left though I question whether this is due to patient compliance as the patient was able to use both sides equally to help her set up when I was trying to auscultate her lungs Extremities: No lower extremity edema   Laboratory: Recent Labs  Lab 02/19/21 1511 02/19/21 1521  WBC 8.3  --   HGB 12.8 13.9  HCT 40.9 41.0  PLT 251  --    Recent Labs  Lab 02/19/21 1511 02/19/21 1521 02/20/21 0311  NA  137 140 137  K 3.4* 3.5 3.6  CL 102 100 104  CO2 29  --  25  BUN '7 7 6  '$ CREATININE 0.83 0.80 0.77  CALCIUM 8.8*  --  8.4*  PROT 7.6  --  7.3  BILITOT 0.3  --  0.7  ALKPHOS 74  --  66  ALT 19  --  19  AST 18  --  15  GLUCOSE 104* 105* 101*     Lurline Del, DO 02/20/2021, 6:37 AM PGY-3, [facility] Intern pager: [phone], text pages  welcome

## 2021-02-20 NOTE — Discharge Summary (Signed)
[facility] Discharge Summary   Patient name: Stacy Campos            Medical record number: [id number] Date of birth: Jan 09, 1971        Age: 50 y.o.    Gender: female Date of Admission: 02/19/2021                      Date of Discharge: 02/20/2021 Admitting Physician: Alen Bleacher, MD   Primary Care Provider: Lattie Haw, MD Consultants: Neurology   Indication for Hospitalization: Weakness   Discharge Diagnoses/Problem List:  Right-sided weakness COVID-19 positive   Disposition: Home   Discharge Condition: Stable   Discharge Exam:   Gen: NAD, Alert & Oriented x 3 Heart: Regular rate and rhythm Lungs: No respiratory distress Neuro: Cranial nerves 2-12 intact, though some hoarseness, Muscle strength 5/5 throughout   Brief Hospital Course:  Stacy Campos is a 50 y.o. female presenting to the ED with right side hemiparesis and aphasia . PMH is significant for HTN, Hydradenitis and Carpel Tunnel.   Right-sided hemiparesis and aphasia-improved/resolved Presented to the ED with right-sided hemiparesis and aphasia.  This resolved the next morning with the patient stating that her strength has mostly gotten back to normal.  Neurology was consulted in the emergency department.  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  Neurology recommended that if her weakness improved she could be discharged home after negative MRI.  It was thought that her right-sided hemiparesis and aphasia were due to conversion disorder versus malingering versus other etiology.  Neurology recommended labs which showed TSH within normal limits, B12 within normal limits, folate within normal limits.  LDL elevated at 122, HDL 39. Cleared by Neyrology on 7/30.  COVID-19 positive/fever/tachycardia Patient remained on  room air throughout the hospitalization.  Her O2 sat remained appropriate throughout this.  She did have some complaints of myalgias and headache which were treated with acetaminophen and a dose of tramadol.  At the time of discharge patient was satting well on room air and was given return precautions.    Discharge recommendations: Recommend high intensity statin as LDL > 70. ASA '81mg'$  po qd given verbal admission of past stroke.  Goal A1c < 7.  Follow up on Metoprolol dosing.  Patient currently has in chart she is taking 24 hr dose TID. 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 This could reflect a mild Chiari 1  malformation. Alternatively, cerebellar tonsillar ectopia may be seen in the setting of idiopathic intracranial hypertension or intracranial hypotension.    Significant Procedures: None  Significant Labs and Imaging:  Recent Labs  Lab 02/19/21 1511 02/19/21 1521 02/20/21 0630  WBC 8.3  --  6.8  HGB 12.8 13.9 11.9*  HCT 40.9 41.0 37.2  PLT 251  --  217   Recent Labs  Lab 02/19/21 1511 02/19/21 1521 02/20/21 0311  NA 137 140 137  K 3.4* 3.5 3.6  CL 102 100 104  CO2 29  --  25  GLUCOSE 104* 105* 101*  BUN '7 7 6  '$ CREATININE 0.83 0.80 0.77  CALCIUM 8.8*  --  8.4*  MG  --   --  1.7  PHOS  --   --  3.4  ALKPHOS 74  --  66  AST 18  --  15  ALT 19  --  19  ALBUMIN 3.7  --  3.5    MR BRAIN WO CONTRAST  Result Date: 02/19/2021 CLINICAL DATA:  Neuro deficit, acute, stroke suspected. Additional history provided: Patient reports right-sided weakness, unable to follow commands. History of stroke with no residual deficits. EXAM: MRI HEAD WITHOUT CONTRAST TECHNIQUE: Multiplanar, multiecho pulse sequences of the brain and surrounding structures were obtained without intravenous contrast. COMPARISON:  Noncontrast head CT  performed earlier today 02/19/2021. Brain MRI 02/04/2020. FINDINGS: Brain: Intermittently motion degraded examination. Most notably, there is mild-to-moderate motion degradation of the coronal T2 TSE sequence. Cerebral volume is normal. Mild multifocal T2/FLAIR hyperintensity within the cerebral white matter. Moderate T2/FLAIR hyperintense signal changes within the pons. These findings are nonspecific, but most often secondary to chronic small vessel ischemia. 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 The right cerebellar tonsil extends up to 7 mm below the level foramen magnum. There is no acute infarct. No evidence of an intracranial mass. No extra-axial fluid collection. No midline shift. Vascular: Expected proximal arterial flow voids. Skull and upper cervical spine: No focal suspicious marrow lesion. Sinuses/Orbits: Visualized orbits show no acute finding. Trace bilateral ethmoid sinus mucosal thickening. IMPRESSION: Intermittently motion degraded exam. No evidence of acute infarction. 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 Alternatively, cerebellar tonsillar ectopia may be seen in the setting of idiopathic intracranial hypertension or intracranial hypotension. Stable T2/FLAIR hyperintense signal changes which are mild in the cerebral white matter, and moderate in the pons. These findings are nonspecific, but most often secondary to chronic small vessel ischemia. Electronically Signed   By: Kellie Simmering DO   On: 02/19/2021 16:34   DG Chest Portable 1 View  Result Date: 02/19/2021 CLINICAL DATA:  Fever EXAM: PORTABLE CHEST 1 VIEW COMPARISON:  04/27/2019 FINDINGS: Cardiomegaly, vascular congestion. Perihilar opacities and interstitial  prominence may reflect early interstitial edema or bronchitic changes. No effusions or acute bony abnormality. IMPRESSION: Cardiomegaly, vascular congestion. Interstitial edema versus bronchitic changes. Electronically Signed   By: Rolm Baptise M.D.   On: 02/19/2021 18:28   CT HEAD CODE STROKE WO CONTRAST  Result Date: 02/19/2021 CLINICAL DATA:  Code stroke. Neuro deficit, acute, stroke suspected; aphasia, right-sided weakness EXAM: CT HEAD WITHOUT CONTRAST TECHNIQUE: Contiguous axial images were obtained from the base of the skull through the vertex without intravenous contrast. COMPARISON:  02/04/2020 FINDINGS: Brain: There is no acute intracranial hemorrhage, mass effect, or edema. No new loss of gray-white differentiation. Ventricles and sulci are normal in size and configuration. There is no extra-axial collection. Stable low-lying cerebellar tonsils. Vascular: No hyperdense vessel or unexpected calcification. Skull: Unremarkable. Sinuses/Orbits: No acute abnormality. Other: Mastoid air cells are clear. ASPECTS ([place] Stroke Program Early CT Score) - Ganglionic level infarction (caudate, lentiform nuclei, internal capsule, insula, M1-M3 cortex): 7 - Supraganglionic infarction (M4-M6 cortex): 3 Total score (0-10 with 10 being normal): 10 IMPRESSION: There is no acute intracranial hemorrhage or evidence of acute infarction. ASPECT score is 10. These results were communicated to Dr. Cheral Marker at 3:29 pm on 02/19/2021 by text page via the [facility] messaging system. Electronically Signed   By: Macy Mis M.D.   On: 02/19/2021 15:30  Results/Tests Pending at Time of Discharge: None  Discharge Medications:  Allergies as of 02/21/2021       Reactions   Valsartan Swelling   Onion Diarrhea   White onions cause severe diarrhea   Dimetapp [albertsons Di Bromm] Hives        Medication List     STOP taking these medications    CINNAMON PO   clindamycin 300 MG capsule Commonly known as: CLEOCIN    ELDERBERRY PO   ibuprofen 600 MG tablet Commonly known as: ADVIL   mupirocin ointment 2 % Commonly known as: BACTROBAN   naproxen sodium 220 MG tablet Commonly known as: ALEVE   rifampin 300 MG capsule Commonly known as: RIFADIN   TURMERIC PO   valACYclovir 1000 MG tablet Commonly known as: VALTREX       TAKE these medications    acetaminophen 325 MG tablet Commonly known as: TYLENOL Take 2 tablets (650 mg total) by mouth every 6 (six) hours as needed for mild pain.   albuterol 108 (90 Base) MCG/ACT inhaler Commonly known as: VENTOLIN HFA Inhale 2 puffs into the lungs every 6 (six) hours as needed for wheezing or shortness of breath.   aspirin 81 MG EC tablet Take 1 tablet (81 mg total) by mouth daily. Swallow whole.   chlorthalidone 25 MG tablet Commonly known as: HYGROTON Take 1 tablet (25 mg total) by mouth daily.   GABAPENTIN PO Take by mouth.   metoprolol succinate 25 MG 24 hr tablet Commonly known as: TOPROL-XL Take 1 tablet (25 mg total) by mouth 3 (three) times daily with meals.   multivitamin with minerals Tabs tablet Take 1 tablet by mouth daily.               Durable Medical Equipment  (From admission, onward)           Start     Ordered   02/20/21 1245  DME Shower stool  Once        02/20/21 1244   02/20/21 1244  DME Walker  Once       Question Answer Comment  Walker: With 5 Inch Wheels   Patient needs a walker to treat with the following condition Weakness      02/20/21 1244            Discharge Instructions: Please refer to Patient Instructions section of EMR for full details.  Patient was counseled important signs and symptoms that should prompt return to medical care, changes in medications, dietary instructions, activity restrictions, and follow up appointments.   Follow-Up Appointments:  Follow-up Information     Lattie Haw, MD. Schedule an appointment as soon as possible for a visit in 1 week(s).    Specialty: Family Medicine Why: Please call to make a follow up appointment in the next 1 week Contact information: [address] [place]. [place] [address] [phone]                 Delora Fuel, MD 02/21/2021, 7:02 AM PGY-1, [facility]

## 2021-02-20 NOTE — TOC Transition Note (Addendum)
Transition of Care [facility]) - CM/SW Discharge Note   Patient Details  Name: Stacy Campos MRN: [id number] Date of Birth: October 14, 1970  [facility] CM/SW Contact:  Konrad Penta, RN Phone Number: [phone] 02/20/2021, 1:59 PM   Clinical Narrative:  Received consult for home health services. Patient has Medicaid. Reached out to [facility], [facility], [employer], [employer] who are unable to accept.  Spoke with patient who states she lives with her sister. Discussed the dilemma of setting up home health. Patient states she would have transportation to outpatient therapy referral.   [place] for DME- shower stool and rolling walker.   Will notify MD of not being able to find home health agency due to insurance Medicaid.      Patient Goals and CMS Choice      Discharge Placement               Discharge Plan and Services                Social Determinants of Health (SDOH) Interventions     Readmission Risk Interventions No flowsheet data found.

## 2021-02-21 DIAGNOSIS — I10 Essential (primary) hypertension: Secondary | ICD-10-CM | POA: Diagnosis not present

## 2021-02-21 DIAGNOSIS — F509 Eating disorder, unspecified: Secondary | ICD-10-CM

## 2021-02-21 DIAGNOSIS — R479 Unspecified speech disturbances: Secondary | ICD-10-CM | POA: Diagnosis not present

## 2021-02-21 DIAGNOSIS — R531 Weakness: Secondary | ICD-10-CM | POA: Diagnosis not present

## 2021-02-21 MED ORDER — METOPROLOL SUCCINATE ER 25 MG PO TB24: 25.0000 mg | ORAL_TABLET | Freq: Every day | ORAL | Status: DC

## 2021-02-21 MED ORDER — METOPROLOL SUCCINATE ER 25 MG PO TB24
25.0000 mg | ORAL_TABLET | Freq: Once | ORAL | Status: AC
  Administered 2021-02-21: 25 mg via ORAL
  Filled 2021-02-21: qty 1

## 2021-02-21 NOTE — Progress Notes (Signed)
Physical Therapy Treatment Patient Details Name: Stacy Campos MRN: [id number] DOB: 03/06/1971 Today's Date: 02/21/2021    History of Present Illness 50 y/o female presented to ED on 7/29 for R sided weakness and aphasia. CT head and MRI negative. Patient inconsistent with presentation while in ED. Neurology suspects possible conversion disorder or functional response to stress/anxiety. Tested positive for COVID in ED. PMH: anxiety, HTN, PNA due to COVID 19 in 2020,    PT Comments    Pt with improving mobility. Did well with walker. Ready for DC home from PT standpoint.   Follow Up Recommendations  Home health PT;Supervision - Intermittent     Equipment Recommendations  Rolling walker with 5" wheels    Recommendations for Other Services       Precautions / Restrictions Precautions Precautions: None Restrictions Weight Bearing Restrictions: No    Mobility  Bed Mobility               General bed mobility comments: Pt sitting EOB    Transfers Overall transfer level: Modified independent Equipment used: Rolling walker (2 wheeled)                Ambulation/Gait Ambulation/Gait assistance: Modified independent (Device/Increase time) Gait Distance (Feet): 60 Feet Assistive device: Rolling walker (2 wheeled) Gait Pattern/deviations: Step-through pattern;Decreased stride length Gait velocity: decr Gait velocity interpretation: 1.31 - 2.62 ft/sec, indicative of limited community ambulator General Gait Details: Slow, steady gait with walker. Distance limited to in room due to covid   Stairs             Wheelchair Mobility    Modified Rankin (Stroke Patients Only)       Balance Overall balance assessment: Needs assistance Sitting-balance support: No upper extremity supported Sitting balance-Leahy Scale: Normal     Standing balance support: No upper extremity supported Standing balance-Leahy Scale: Fair                               Cognition Arousal/Alertness: Awake/alert Behavior During Therapy: WFL for tasks assessed/performed Overall Cognitive Status: Within Functional Limits for tasks assessed                                        Exercises      General Comments        Pertinent Vitals/Pain Pain Assessment: No/denies pain    Home Living                      Prior Function            PT Goals (current goals can now be found in the care plan section) Progress towards PT goals: Progressing toward goals    Frequency    Min 3X/week      PT Plan Current plan remains appropriate    Co-evaluation              AM-PAC PT "6 Clicks" Mobility   Outcome Measure  Help needed turning from your back to your side while in a flat bed without using bedrails?: None Help needed moving from lying on your back to sitting on the side of a flat bed without using bedrails?: None Help needed moving to and from a bed to a chair (including a wheelchair)?: None Help needed standing up from a chair using your arms (e.g., wheelchair  or bedside chair)?: None Help needed to walk in hospital room?: None Help needed climbing 3-5 steps with a railing? : A Little 6 Click Score: 23    End of Session   Activity Tolerance: Patient tolerated treatment well Patient left: in bed;with call bell/phone within reach (sitting EOB) Nurse Communication: Mobility status PT Visit Diagnosis: Muscle weakness (generalized) (M62.81)     Time: [id number] PT Time Calculation (min) (ACUTE ONLY): 10 min  Charges:  $Gait Training: 8-22 mins                     [place] Pager [phone] Office [place] 02/21/2021, 11:46 AM

## 2021-02-21 NOTE — Progress Notes (Signed)
FPTS Brief Progress Note  S:  No concerns per nurse   O: BP (!) 164/96 (BP Location: Left Arm)   Pulse 91   Temp 98 F (36.7 C) (Oral)   Resp 14   Ht '5\' 7"'$  (1.702 m)   Wt 134.4 kg   LMP 10/25/2018   SpO2 95%   BMI 46.41 kg/m    Gen:  Resting comfortably in bed  A/P: - D/C in AM - Orders reviewed. Labs for AM not ordered, which was adjusted as needed.  - If condition changes, plan includes reach out to Neuro.   Delora Fuel, MD 02/21/2021, 12:37 AM PGY-1, [facility] Night Resident  Please page [phone] with questions.

## 2021-02-21 NOTE — TOC Transition Note (Signed)
Transition of Care [facility]) - CM/SW Discharge Note   Patient Details  Name: Stacy Campos MRN: [id number] Date of Birth: 02-13-1971  [facility] CM/SW Contact:  Pollie Friar, RN Phone Number: 02/21/2021, 10:35 AM   Clinical Narrative:    Patient is discharging home with [facility] services through [place]. [facility] with [facility] accepted the referral and information is on the AVS. DME for home is by the pts door on the unit. Pt has transport home.   Final next level of care: Home w Home Health Services Barriers to Discharge: No Barriers Identified   Patient Goals and CMS Choice   CMS Medicare.gov Compare Post Acute Care list provided to:: Patient Choice offered to / list presented to : Patient  Discharge Placement                       Discharge Plan and Services                DME Arranged: Shower stool, Walker rolling DME Agency: [employer]       HH Arranged: PT, OT HH Agency: [facility] Date [place]: 02/21/21   Representative spoke with at [place]: [place] ([place]) Interventions     Readmission Risk Interventions No flowsheet data found.

## 2021-02-22 ENCOUNTER — Telehealth: Payer: Self-pay

## 2021-02-22 NOTE — Telephone Encounter (Signed)
Transition Care Management Unsuccessful Follow-up Telephone Call  Date of discharge and from where:  02/21/2021-[place]   Attempts:  1st Attempt  Reason for unsuccessful TCM follow-up call:  Voice mail full

## 2021-02-23 NOTE — Telephone Encounter (Signed)
Transition Care Management Unsuccessful Follow-up Telephone Call  Date of discharge and from where:  02/21/2021-[place]   Attempts:  2nd Attempt  Reason for unsuccessful TCM follow-up call:  Voice mail full

## 2021-02-24 LAB — CULTURE, BLOOD (ROUTINE X 2)
Culture: NO GROWTH
Culture: NO GROWTH

## 2021-02-24 NOTE — Telephone Encounter (Signed)
Transition Care Management Unsuccessful Follow-up Telephone Call  Date of discharge and from where:  02/21/2021-[place]   Attempts:  3rd Attempt  Reason for unsuccessful TCM follow-up call:  Voice mail full
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
## 2021-02-25 ENCOUNTER — Ambulatory Visit (INDEPENDENT_AMBULATORY_CARE_PROVIDER_SITE_OTHER): Payer: Medicaid Other | Admitting: Physician Assistant

## 2021-02-25 ENCOUNTER — Encounter: Payer: Self-pay | Admitting: Physician Assistant

## 2021-02-25 ENCOUNTER — Other Ambulatory Visit: Payer: Self-pay

## 2021-02-25 VITALS — BP 138/78 | HR 88 | Ht 67.0 in | Wt 287.8 lb

## 2021-02-25 DIAGNOSIS — I10 Essential (primary) hypertension: Secondary | ICD-10-CM | POA: Diagnosis not present

## 2021-02-25 DIAGNOSIS — E785 Hyperlipidemia, unspecified: Secondary | ICD-10-CM

## 2021-02-25 MED ORDER — METOPROLOL SUCCINATE ER 25 MG PO TB24: ORAL_TABLET | ORAL | 3 refills | Status: DC

## 2021-02-25 MED ORDER — ROSUVASTATIN CALCIUM 5 MG PO TABS: 5.0000 mg | ORAL_TABLET | Freq: Every day | ORAL | 0 refills | Status: DC

## 2021-02-25 NOTE — Patient Instructions (Addendum)
Medication Instructions:  TAKE Metoprolol Tartrate 75 mg ( 3-25 mg tablets) at night START Crestor 5 mg daily. INCREASE to 10 mg after 1 month. Give office a call for new prescription *If you need a refill on your cardiac medications before your next appointment, please call your pharmacy*  Lab Work: Your physician recommends that you return for a FASTING lipid profile in 6 weeks 04/08/21:   DO NOT EAT OR DRINK PAST MIDNIGHT. OKAY TO HAVE WATER. If you have labs (blood work) drawn today and your tests are completely normal, you will receive your results only by: [place] (if you have MyChart) OR A paper copy in the mail If you have any lab test that is abnormal or we need to change your treatment, we will call you to review the results.  Testing/Procedures: NONE ordered at this time of appointment   Follow-Up: At [facility], you and your health needs are our priority.  As part of our continuing mission to provide you with exceptional heart care, we have created designated Provider Care Teams.  These Care Teams include your primary Cardiologist (physician) and Advanced Practice Providers (APPs -  Physician Assistants and Nurse Practitioners) who all work together to provide you with the care you need, when you need it.  We recommend signing up for the patient portal called "MyChart".  Sign up information is provided on this After Visit Summary.  MyChart is used to connect with patients for Virtual Visits (Telemedicine).  Patients are able to view lab/test results, encounter notes, upcoming appointments, etc.  Non-urgent messages can be sent to your provider as well.   To learn more about what you can do with MyChart, go to [email or web address].    Your next appointment:   1 year(s)  The format for your next appointment:   In Person  Provider:   Quay Burow, MD  Other Instructions Omron blood pressure cuff to monitor your blood pressure at home  Mediterranean Diet A  Mediterranean diet refers to food and lifestyle choices that are based on the traditions of countries located on the [employer]. This way of eating has been shown to help prevent certain conditions and improve outcomes forpeople who have chronic diseases, like kidney disease and heart disease. What are tips for following this plan? Lifestyle Cook and eat meals together with your family, when possible. Drink enough fluid to keep your urine clear or pale yellow. Be physically active every day. This includes: Aerobic exercise like running or swimming. Leisure activities like gardening, walking, or housework. Get 7-8 hours of sleep each night. If recommended by your health care provider, drink red wine in moderation. This means 1 glass a day for nonpregnant women and 2 glasses a day for men. A glass of wine equals 5 oz (150 mL). Reading food labels  Check the serving size of packaged foods. For foods such as rice and pasta, the serving size refers to the amount of cooked product, not dry. Check the total fat in packaged foods. Avoid foods that have saturated fat or trans fats. Check the ingredients list for added sugars, such as corn syrup.  Shopping At the grocery store, buy most of your food from the areas near the walls of the store. This includes: Fresh fruits and vegetables (produce). Grains, beans, nuts, and seeds. Some of these may be available in unpackaged forms or large amounts (in bulk). Fresh seafood. Poultry and eggs. Low-fat dairy products. Buy whole ingredients instead of prepackaged foods. Buy fresh  fruits and vegetables in-season from local farmers markets. Buy frozen fruits and vegetables in resealable bags. If you do not have access to quality fresh seafood, buy precooked frozen shrimp or canned fish, such as tuna, salmon, or sardines. Buy small amounts of raw or cooked vegetables, salads, or olives from the deli or salad bar at your store. Stock your pantry so you  always have certain foods on hand, such as olive oil, canned tuna, canned tomatoes, rice, pasta, and beans. Cooking Cook foods with extra-virgin olive oil instead of using butter or other vegetable oils. Have meat as a side dish, and have vegetables or grains as your main dish. This means having meat in small portions or adding small amounts of meat to foods like pasta or stew. Use beans or vegetables instead of meat in common dishes like chili or lasagna. Experiment with different cooking methods. Try roasting or broiling vegetables instead of steaming or sauteing them. Add frozen vegetables to soups, stews, pasta, or rice. Add nuts or seeds for added healthy fat at each meal. You can add these to yogurt, salads, or vegetable dishes. Marinate fish or vegetables using olive oil, lemon juice, garlic, and fresh herbs. Meal planning  Plan to eat 1 vegetarian meal one day each week. Try to work up to 2 vegetarian meals, if possible. Eat seafood 2 or more times a week. Have healthy snacks readily available, such as: Vegetable sticks with hummus. Greek yogurt. Fruit and nut trail mix. Eat balanced meals throughout the week. This includes: Fruit: 2-3 servings a day Vegetables: 4-5 servings a day Low-fat dairy: 2 servings a day Fish, poultry, or lean meat: 1 serving a day Beans and legumes: 2 or more servings a week Nuts and seeds: 1-2 servings a day Whole grains: 6-8 servings a day Extra-virgin olive oil: 3-4 servings a day Limit red meat and sweets to only a few servings a month  What are my food choices? Mediterranean diet Recommended Grains: Whole-grain pasta. Brown rice. Bulgar wheat. Polenta. Couscous. Whole-wheat bread. [place]. Vegetables: Artichokes. Beets. Broccoli. Cabbage. Carrots. Eggplant. Green beans. Chard. Kale. Spinach. Onions. Leeks. Peas. Squash. Tomatoes. Peppers. Radishes. Fruits: Apples. Apricots. Avocado. Berries. Bananas. Cherries. Dates. Figs. Grapes.  Lemons. Melon. Oranges. Peaches. Plums. Pomegranate. Meats and other protein foods: Beans. Almonds. Sunflower seeds. Pine nuts. Peanuts. [place]. Salmon. Scallops. Shrimp. [place]. Tilapia. Clams. Oysters. Eggs. Dairy: Low-fat milk. Cheese. Greek yogurt. Beverages: Water. Red wine. Herbal tea. Fats and oils: Extra virgin olive oil. Avocado oil. Grape seed oil. Sweets and desserts: [place] yogurt with honey. Baked apples. Poached pears. Trail mix. Seasoning and other foods: Basil. Cilantro. Coriander. Cumin. Mint. Parsley. Sage. Rosemary. Tarragon. Garlic. Oregano. Thyme. Pepper. Balsalmic vinegar. Tahini. Hummus. Tomato sauce. Olives. Mushrooms. Limit these Grains: Prepackaged pasta or rice dishes. Prepackaged cereal with added sugar. Vegetables: Deep fried potatoes (french fries). Fruits: Fruit canned in syrup. Meats and other protein foods: Beef. Pork. Lamb. Poultry with skin. Hot dogs. Berniece Salines. Dairy: Ice cream. Sour cream. Whole milk. Beverages: Juice. Sugar-sweetened soft drinks. Beer. Liquor and spirits. Fats and oils: Butter. Canola oil. Vegetable oil. Beef fat (tallow). Lard. Sweets and desserts: Cookies. Cakes. Pies. Candy. Seasoning and other foods: Mayonnaise. Premade sauces and marinades. The items listed may not be a complete list. Talk with your dietitian aboutwhat dietary choices are right for you. Summary The Mediterranean diet includes both food and lifestyle choices. Eat a variety of fresh fruits and vegetables, beans, nuts, seeds, and whole grains. Limit the amount of red  meat and sweets that you eat. Talk with your health care provider about whether it is safe for you to drink red wine in moderation. This means 1 glass a day for nonpregnant women and 2 glasses a day for men. A glass of wine equals 5 oz (150 mL). This information is not intended to replace advice given to you by your health care provider. Make sure you discuss any questions you have with your healthcare  provider. Document Revised: 03/10/2016 Document Reviewed: 03/03/2016 Elsevier Patient Education  [place].

## 2021-02-28 NOTE — Progress Notes (Signed)
     SUBJECTIVE:   CHIEF COMPLAINT / HPI:   Stacy Campos is a 50 y.o. female presents for hospital follow up  Right sided hemiparesis, aphasia  Pt presented to the ED with right sided hemiparasis and aphasia. CT head: negative. MRI showed no acute infarct, redemonstrated the right greater than left cerebellar tonsillar ectopia that could reflect a mild Chiari I malformation versus idiopathic intracranial hypertension or intracranial hypotension.Neuro consulted who felt symptoms were due to conversion disorder VS malingering. TSH, B12, folate wnl. Pt needs to follow up with neurology for above MRI findings. She is not taking aspirin.   COVID 19 Tested positive for COVID 19 on 02/19/21. During recent hospitalization pt satted well on room air. Also has COVID in 2020 too. Tested negative on home tests on 02/25/21. Denies fevers chest pain, palpitations, dizziness, cough and dyspnea. COVID vaccinations: unvaccinated.  [facility]. Patient is starting Rosuvastatin '5mg'$  today. Will start taking for 2 days for 4 weeks and then will start taking this daily for 3 weeks, then will '10mg'$  daily. She will then follow up with cardiology.   HTN  Pt takes Metoprolol for '75mg'$  once daily. Per cardiologist recommended chloarthalidone if BP >140/90 for 10 days.    [facility] Visit from 05/13/2020 in [place]  PHQ-9 Total Score 13      PERTINENT  PMH / PSH: covid, HTN, HLD   OBJECTIVE:   BP 122/84   Pulse 80   Ht '5\' 7"'$  (1.702 m)   Wt 288 lb 9.6 oz (130.9 kg)   LMP 10/25/2018   SpO2 95%   BMI 45.20 kg/m    General: Alert, no acute distress, well appearing Cardio: well perfused  Pulm: normal work of breathing Neuro: Cranial nerves grossly intact   ASSESSMENT/PLAN:   CVA (cerebral vascular accident) ([place]) CVA work-up in hospital negative.  Neurology felt that her symptoms were more due to conversion disorder versus malingering.  Neurology referral  placed for abnormal MRI findings showing left cerebellar tonsillar ectopia, possibly due to type I Arnold-Chiari formation versus idiopathic intracranial hypertension/intracranial hypotension.  I have placed a neurology referral.  Also recommended patient takes 81 mg aspirin daily until she sees the neurologist.  COVID-19 Recent COVID-19 infection.  Patient asymptomatic and afebrile.  Patient unvaccinated against COVID, encouraged these vaccinations, patient declined today.  Essential hypertension Blood pressure 122/84 today, at goal.  Patient saw her cardiologist last week for follow-up.  I clarified patient's antihypertensive medications-75 mg metoprolol daily as needed chlorthalidone 25 mg if blood pressure is high as above 140/94 for a period greater than 10 days.   Healthcare maintenance Recommended Tdap, pneumonia, COVID and shingles vaccines.  Patient declined these vaccines today but will get them when she is ready to do so.  Hyperlipidemia LDL 122 on 7/30.  We will start rosuvastatin today and titrate dose up per cardiology.  Repeat lipid panel in 6 to 8 weeks.     Lattie Haw, MD PGY-3 [place]

## 2021-03-01 ENCOUNTER — Other Ambulatory Visit: Payer: Self-pay

## 2021-03-01 ENCOUNTER — Ambulatory Visit (INDEPENDENT_AMBULATORY_CARE_PROVIDER_SITE_OTHER): Payer: Medicaid Other | Admitting: Family Medicine

## 2021-03-01 VITALS — BP 122/84 | HR 80 | Ht 67.0 in | Wt 288.6 lb

## 2021-03-01 DIAGNOSIS — I10 Essential (primary) hypertension: Secondary | ICD-10-CM

## 2021-03-01 DIAGNOSIS — E785 Hyperlipidemia, unspecified: Secondary | ICD-10-CM

## 2021-03-01 DIAGNOSIS — U071 COVID-19: Secondary | ICD-10-CM | POA: Insufficient documentation

## 2021-03-01 DIAGNOSIS — R9089 Other abnormal findings on diagnostic imaging of central nervous system: Secondary | ICD-10-CM | POA: Diagnosis not present

## 2021-03-01 DIAGNOSIS — Z0000 Encounter for general adult medical examination without abnormal findings: Secondary | ICD-10-CM

## 2021-03-01 DIAGNOSIS — I639 Cerebral infarction, unspecified: Secondary | ICD-10-CM

## 2021-03-01 HISTORY — DX: Encounter for general adult medical examination without abnormal findings: Z00.00

## 2021-03-01 NOTE — Assessment & Plan Note (Addendum)
CVA work-up in hospital negative.  Neurology felt that her symptoms were more due to conversion disorder versus malingering.  Neurology referral placed for abnormal MRI findings showing left cerebellar tonsillar ectopia, possibly due to type I Arnold-Chiari formation versus idiopathic intracranial hypertension/intracranial hypotension.  I have placed a neurology referral.  Also recommended patient takes 81 mg aspirin daily until she sees the neurologist.

## 2021-03-01 NOTE — Assessment & Plan Note (Signed)
LDL 122 on 7/30.  We will start rosuvastatin today and titrate dose up per cardiology.  Repeat lipid panel in 6 to 8 weeks.

## 2021-03-01 NOTE — Patient Instructions (Signed)
Thank you for coming to see me today. It was a pleasure. Today was your hospital follow up. I recommend: -Continuing aspirin '81mg'$  -Recommend Tetanus, COVID, shingles and pneumonia vaccine when you are able to.  -referring to neurology for follow up based on MRI results.  Please follow-up with me as and when you need.  If you have any questions or concerns, please do not hesitate to call the office at [phone].  Best wishes,   Dr Posey Pronto

## 2021-03-01 NOTE — Assessment & Plan Note (Addendum)
Recent COVID-19 infection.  Patient asymptomatic and afebrile.  Patient unvaccinated against COVID, encouraged these vaccinations, patient declined today.

## 2021-03-01 NOTE — Assessment & Plan Note (Signed)
Blood pressure 122/84 today, at goal.  Patient saw her cardiologist last week for follow-up.  I clarified patient's antihypertensive medications-75 mg metoprolol daily as needed chlorthalidone 25 mg if blood pressure is high as above 140/94 for a period greater than 10 days.

## 2021-03-01 NOTE — Assessment & Plan Note (Signed)
Recommended Tdap, pneumonia, COVID and shingles vaccines.  Patient declined these vaccines today but will get them when she is ready to do so.

## 2021-03-09 ENCOUNTER — Encounter: Payer: Self-pay | Admitting: Neurology

## 2021-03-17 DIAGNOSIS — R9082 White matter disease, unspecified: Secondary | ICD-10-CM | POA: Diagnosis not present

## 2021-03-17 DIAGNOSIS — R9089 Other abnormal findings on diagnostic imaging of central nervous system: Secondary | ICD-10-CM | POA: Diagnosis not present

## 2021-03-17 DIAGNOSIS — Z8673 Personal history of transient ischemic attack (TIA), and cerebral infarction without residual deficits: Secondary | ICD-10-CM | POA: Diagnosis not present

## 2021-03-17 DIAGNOSIS — Z719 Counseling, unspecified: Secondary | ICD-10-CM | POA: Diagnosis not present

## 2021-03-17 DIAGNOSIS — G935 Compression of brain: Secondary | ICD-10-CM | POA: Diagnosis not present

## 2021-03-18 ENCOUNTER — Other Ambulatory Visit: Payer: Self-pay | Admitting: Pain Medicine

## 2021-03-18 DIAGNOSIS — G4489 Other headache syndrome: Secondary | ICD-10-CM

## 2021-03-19 ENCOUNTER — Telehealth: Payer: Self-pay

## 2021-03-19 NOTE — Telephone Encounter (Signed)
Patient calls nurse line stating she needs an accomodation letter for school. Patient reports her daily headaches have caused her to fall behind in her classes. Patient states she spoke to her school and they need a letter addressing the following:  -Medical diagnose -Impact on school work   The letter needs to state she needs additional time to complete assignments and tests.   Letter can be faxed [phone] [employer]- Disability Services

## 2021-03-24 ENCOUNTER — Other Ambulatory Visit: Payer: Self-pay | Admitting: Family Medicine

## 2021-03-24 NOTE — Telephone Encounter (Signed)
Letter has been put in the to be faxed box. Ottis Stain, CMA

## 2021-03-24 NOTE — Telephone Encounter (Signed)
Hi team I have written this letter and it is under communications. Please could you kindly fax the letter to the number below. Thank you

## 2021-04-02 DIAGNOSIS — Z8673 Personal history of transient ischemic attack (TIA), and cerebral infarction without residual deficits: Secondary | ICD-10-CM | POA: Diagnosis not present

## 2021-04-03 ENCOUNTER — Other Ambulatory Visit: Payer: Medicaid Other

## 2021-04-06 DIAGNOSIS — R2990 Unspecified symptoms and signs involving the nervous system: Secondary | ICD-10-CM | POA: Diagnosis not present

## 2021-04-06 DIAGNOSIS — R9082 White matter disease, unspecified: Secondary | ICD-10-CM | POA: Diagnosis not present

## 2021-04-06 DIAGNOSIS — G935 Compression of brain: Secondary | ICD-10-CM | POA: Diagnosis not present

## 2021-04-06 DIAGNOSIS — R9089 Other abnormal findings on diagnostic imaging of central nervous system: Secondary | ICD-10-CM | POA: Diagnosis not present

## 2021-04-09 ENCOUNTER — Other Ambulatory Visit: Payer: Self-pay

## 2021-04-09 DIAGNOSIS — E785 Hyperlipidemia, unspecified: Secondary | ICD-10-CM

## 2021-04-12 DIAGNOSIS — E785 Hyperlipidemia, unspecified: Secondary | ICD-10-CM | POA: Diagnosis not present

## 2021-04-13 LAB — LIPID PANEL
Chol/HDL Ratio: 3.1 ratio (ref 0.0–4.4)
Cholesterol, Total: 129 mg/dL (ref 100–199)
HDL: 41 mg/dL (ref >39)
LDL Chol Calc (NIH): 69 mg/dL (ref 0–99)
Triglycerides: 102 mg/dL (ref 0–149)
VLDL Cholesterol Cal: 19 mg/dL (ref 5–40)

## 2021-04-14 ENCOUNTER — Encounter: Payer: Self-pay | Admitting: *Deleted

## 2021-04-17 ENCOUNTER — Other Ambulatory Visit: Payer: Medicaid Other

## 2021-04-26 DIAGNOSIS — L732 Hidradenitis suppurativa: Secondary | ICD-10-CM | POA: Diagnosis not present

## 2021-05-24 ENCOUNTER — Other Ambulatory Visit: Payer: Self-pay

## 2021-05-24 MED ORDER — ROSUVASTATIN CALCIUM 5 MG PO TABS: 5.0000 mg | ORAL_TABLET | Freq: Every day | ORAL | 0 refills | Status: DC

## 2021-05-26 ENCOUNTER — Ambulatory Visit
Admission: RE | Admit: 2021-05-26 | Discharge: 2021-05-26 | Disposition: A | Discharge status: Home / Self Care | Payer: Medicaid Other | Source: Ambulatory Visit | Attending: Pain Medicine | Admitting: Pain Medicine

## 2021-05-26 DIAGNOSIS — M4802 Spinal stenosis, cervical region: Secondary | ICD-10-CM | POA: Diagnosis not present

## 2021-05-26 DIAGNOSIS — G4489 Other headache syndrome: Secondary | ICD-10-CM

## 2021-05-26 IMAGING — MR MR CERVICAL SPINE W/O CM
4 of 5 series · 27 of 48 positions shown · non-contrast
Comparison: Cervical spine radiographs [DATE]. Head MRI
[DATE].

CLINICAL DATA: Headache syndrome. Neck clicking, headaches, and
numbness in the hands and legs since [REDACTED].

EXAM:
MRI CERVICAL SPINE WITHOUT CONTRAST
TECHNIQUE: Multiplanar, multisequence MR imaging of the cervical spine was
performed. No intravenous contrast was administered.

[Series 5: T2 · sagittal · 3.0mm · 0.55mm/px · 6 of 15 slices shown (1 of 2)]
[im 1/15]
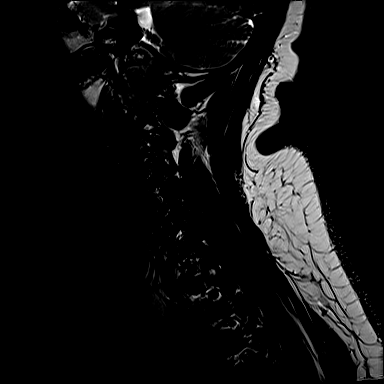
[im 3/15]
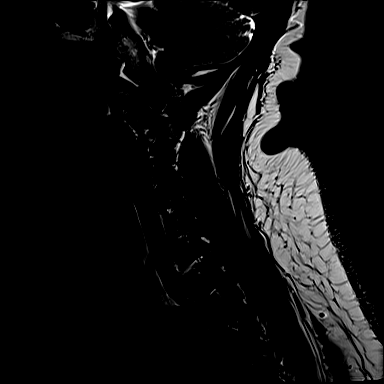
[im 6/15]
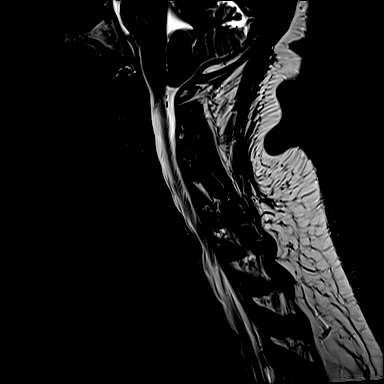
[im 9/15]
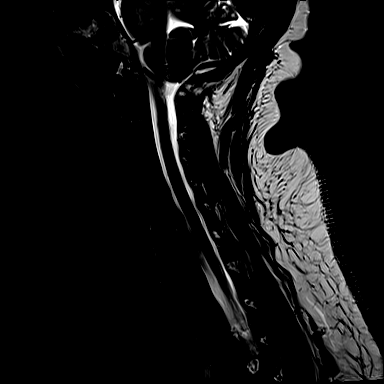
[im 12/15]
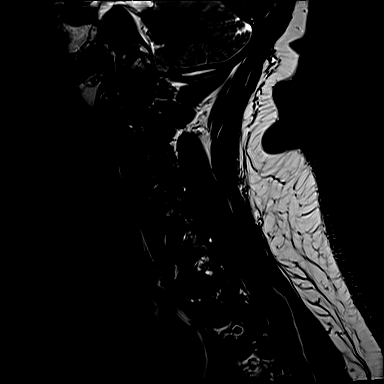
[im 15/15]
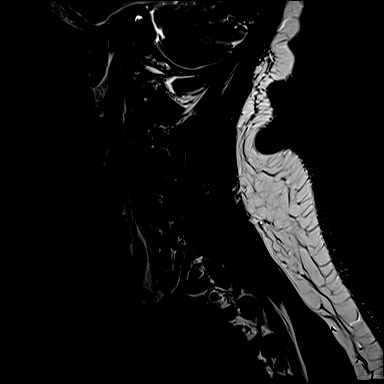

[Series 6: T1 · sagittal · 3.0mm · 0.66mm/px · 7 of 15 slices shown]
[im 1/15]
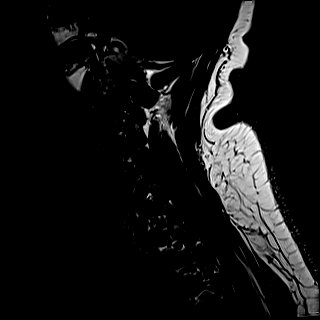
[im 3/15]
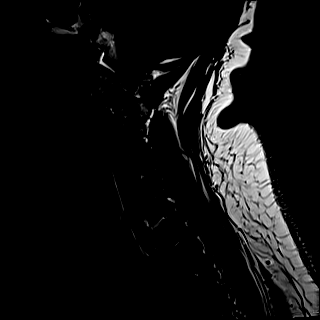
[im 5/15]
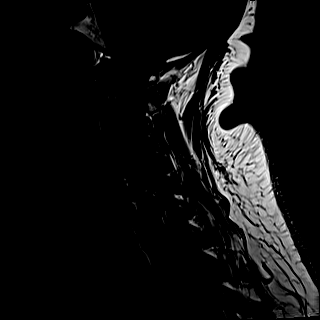
[im 8/15]
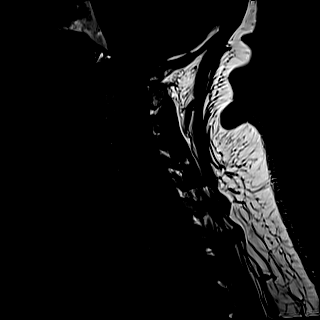
[im 10/15]
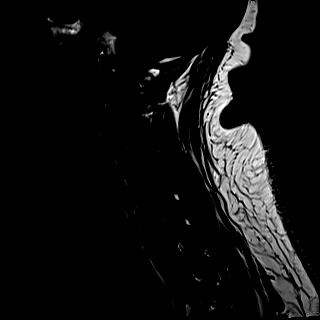
[im 12/15]
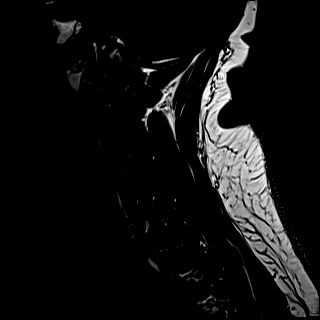
[im 15/15]
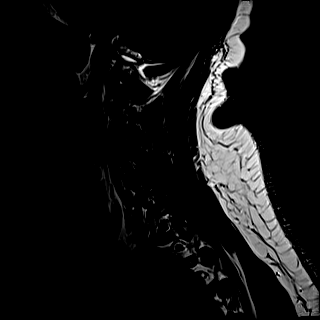

[Series 7: STIR · sagittal · 3.0mm · 0.33mm/px · 6 of 15 slices shown]
[im 1/15]
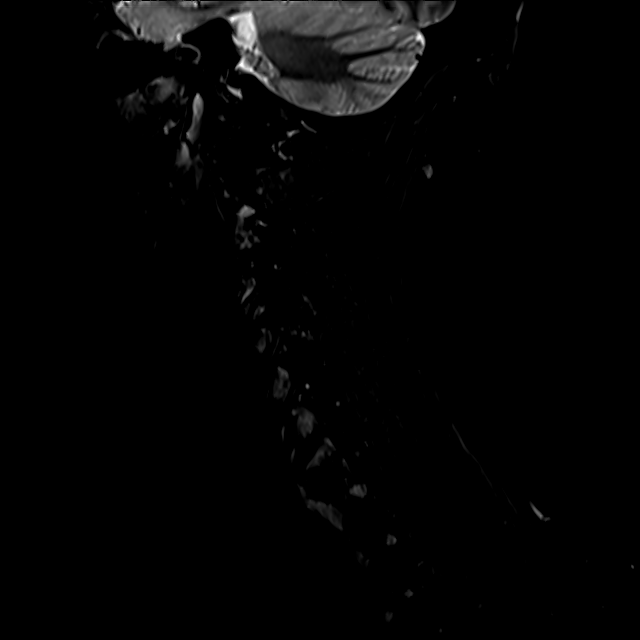
[im 3/15]
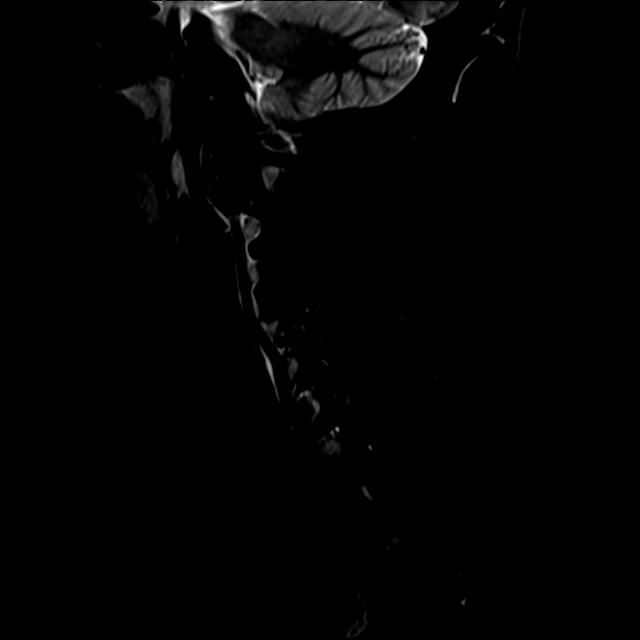
[im 5/15]
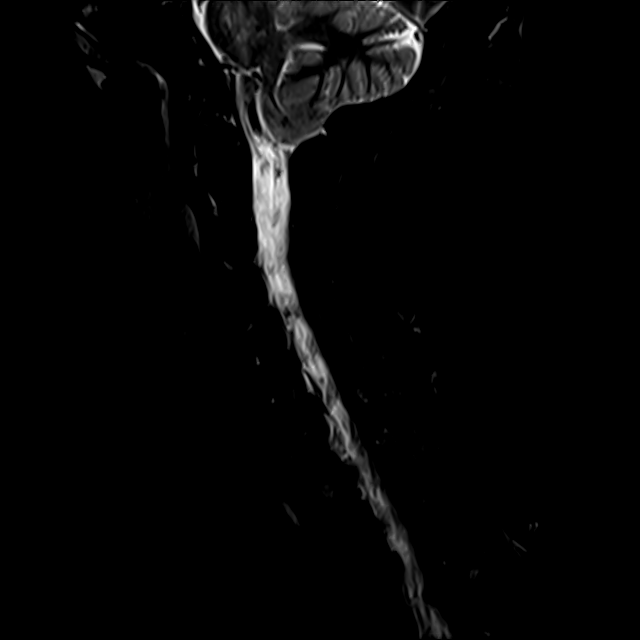
[im 8/15]
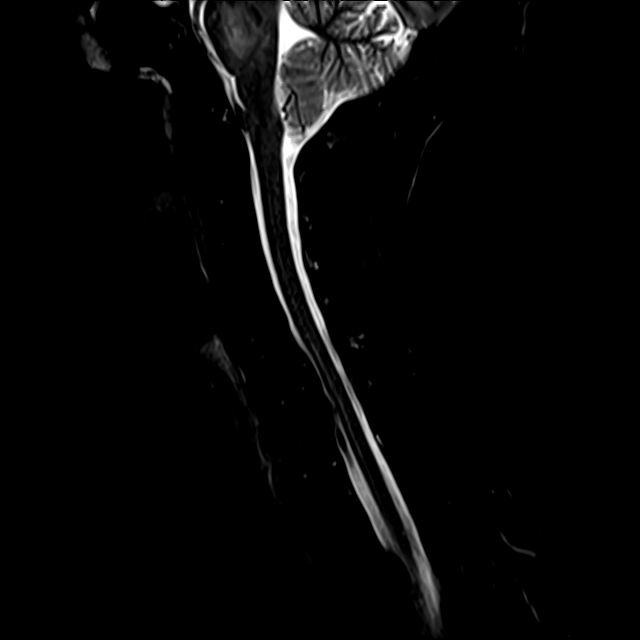
[im 10/15]
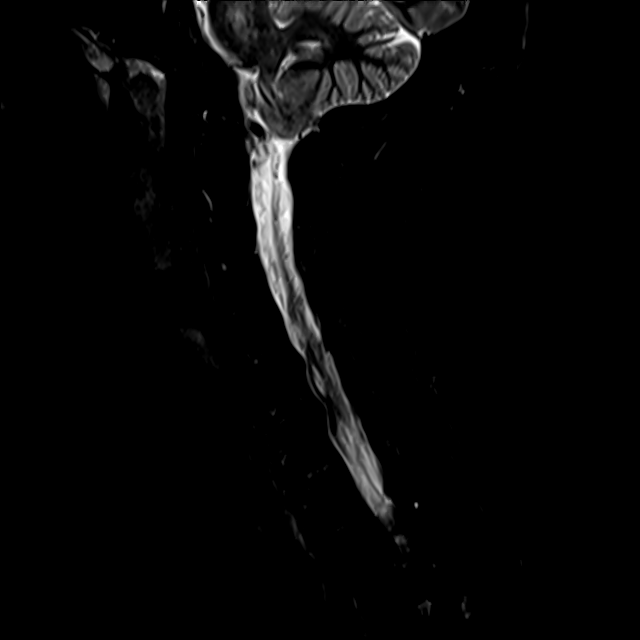
[im 12/15]
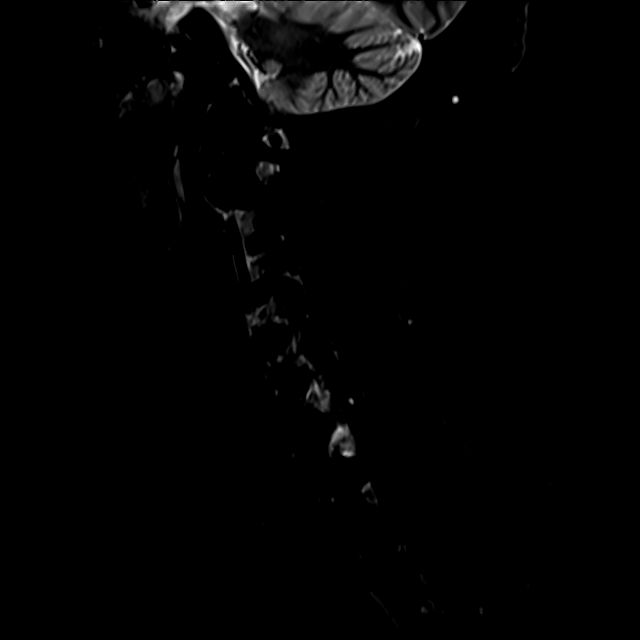

[Series 8: T2 · axial · 3.0mm · 0.50mm/px · z∈[-61,+31]mm · 8 of 32 slices shown (2 of 2)]
[im 1/32]
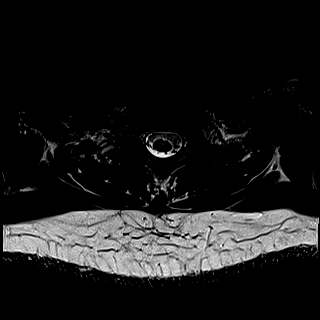
[im 5/32]
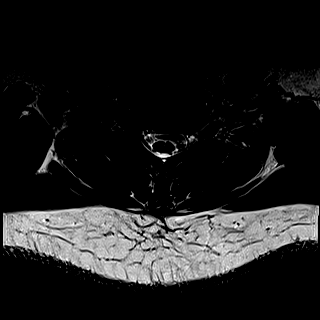
[im 10/32]
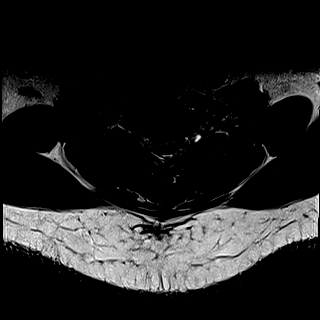
[im 15/32]
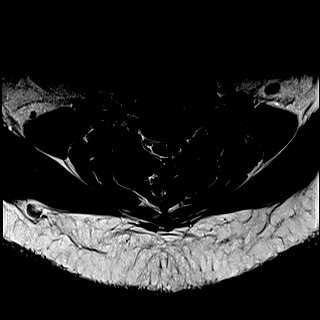
[im 17/32]
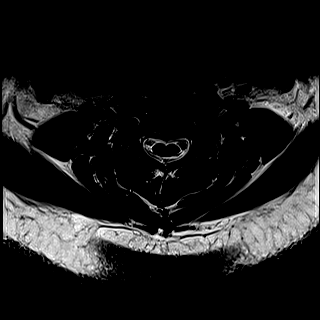
[im 22/32]
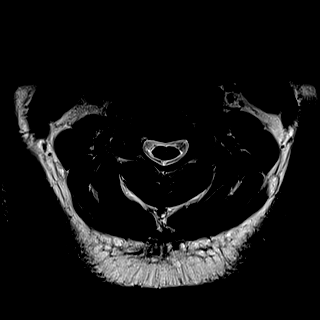
[im 27/32]
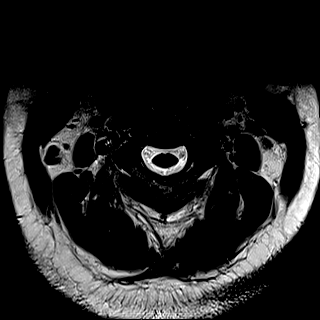
[im 32/32]
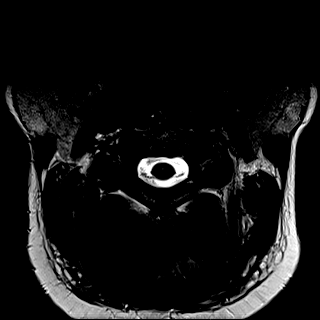

[27 of 48 positions shown; findings below may reference images not displayed]

FINDINGS: Alignment: Straightening of the normal cervical lordosis. No
significant listhesis.

Vertebrae: No fracture, suspicious marrow lesion, or significant
marrow edema.

Cord: Normal cervical spinal cord signal. Partially visualized
syrinx in the thoracic spinal cord at T3 measuring approximately
mm in diameter, only included on sagittal images.

Posterior Fossa, vertebral arteries, paraspinal tissues: Patchy T2
hyperintensity in the pons and 8 mm of cerebellar tonsillar ectopia
as seen on the prior head MRI. Preserved vertebral artery flow
voids.

Disc levels:

Mild disc space narrowing at C5-6 and C6-7.

C2-3: Negative.

C3-4: Mild right and moderate left facet arthrosis without disc
herniation or stenosis.

C4-5: Mild uncovertebral spurring and moderate right facet arthrosis
result in moderate right neural foraminal stenosis without spinal
stenosis.

C5-6: Disc bulging, a central disc protrusion, uncovertebral
spurring, and mild facet arthrosis result in mild spinal stenosis
and mild left neural foraminal stenosis.

C6-7: A moderate-sized right paracentral disc extrusion with mild
cranial greater than caudal migration result in moderate right-sided
spinal stenosis with moderate right ventral cord flattening. Disc
bulging and right uncovertebral spurring result in mild right neural
foraminal stenosis.

C7-T1: Negative.
IMPRESSION: 1. C6-7 disc extrusion with moderate spinal stenosis.
2. Mild spinal stenosis at C5-6.
3. Moderate right neural foraminal stenosis at C4-5.
4. 8 mm of cerebellar tonsillar ectopia/mild Chiari I malformation.
5. Partially visualized syrinx in the upper thoracic spinal cord. A
dedicated thoracic spine MRI (preferably without and with contrast)
is recommended for further evaluation.

## 2021-05-28 ENCOUNTER — Ambulatory Visit: Payer: Medicaid Other | Admitting: Neurology

## 2021-05-28 ENCOUNTER — Encounter: Payer: Self-pay | Admitting: Family Medicine

## 2021-05-28 ENCOUNTER — Telehealth: Payer: Self-pay | Admitting: Family Medicine

## 2021-05-28 DIAGNOSIS — R9082 White matter disease, unspecified: Secondary | ICD-10-CM | POA: Diagnosis not present

## 2021-05-28 DIAGNOSIS — G4489 Other headache syndrome: Secondary | ICD-10-CM | POA: Diagnosis not present

## 2021-05-28 DIAGNOSIS — R9089 Other abnormal findings on diagnostic imaging of central nervous system: Secondary | ICD-10-CM | POA: Diagnosis not present

## 2021-05-28 DIAGNOSIS — G935 Compression of brain: Secondary | ICD-10-CM | POA: Insufficient documentation

## 2021-05-28 DIAGNOSIS — R937 Abnormal findings on diagnostic imaging of other parts of musculoskeletal system: Secondary | ICD-10-CM | POA: Diagnosis not present

## 2021-05-28 NOTE — Telephone Encounter (Addendum)
Called patient as I received the results while covering Dr. Serita Grit box of a cervical MRI with results that show a partial syrinx and chiari I malformation. Patient reports that she is not feeling well today, she had a fall two days ago and reports weakness and pain on her right side that is new (appeared prior to fall). She also reports increasing difficulty with walking and not being able to use the walker she received. I discussed case with Drs. Brown and McDiarmid, and given severity of those symptoms, new/worsening aspects, and patient has a history of a stroke, recommend she go to the ED for evaluation. I tried to contact patient again to notify her of recommendation, left HIPAA compliant VM. If patient returns call, please let her know this information.  I have scheduled an appt at [facility] on 06/01/21 at 9:50 AM for follow up. Cervical MRI recommends dedicated thoracic MRI to evaluate syrinx, recommend ordering this at that visit. Patient will need referral to neurosurgery and requests new referral for second opinion of neurology. Will also recommend consideration of referral to PM&R.   Patient also reports severe headaches as she is in an online college program and it takes her many hours to complete her assignments. I faxed note to school requesting accommodations given headaches are likely due to Chiari I malformation and screen usage.  Gladys Damme, MD [employer], PGY-3

## 2021-06-01 ENCOUNTER — Encounter: Payer: Self-pay | Admitting: *Deleted

## 2021-06-01 ENCOUNTER — Ambulatory Visit (INDEPENDENT_AMBULATORY_CARE_PROVIDER_SITE_OTHER): Payer: Medicaid Other | Admitting: Family Medicine

## 2021-06-01 ENCOUNTER — Other Ambulatory Visit: Payer: Self-pay

## 2021-06-01 ENCOUNTER — Encounter: Payer: Self-pay | Admitting: Family Medicine

## 2021-06-01 ENCOUNTER — Telehealth: Payer: Self-pay

## 2021-06-01 VITALS — BP 142/74 | HR 95 | Ht 67.0 in | Wt 304.8 lb

## 2021-06-01 DIAGNOSIS — G950 Syringomyelia and syringobulbia: Secondary | ICD-10-CM | POA: Diagnosis not present

## 2021-06-01 DIAGNOSIS — M5416 Radiculopathy, lumbar region: Secondary | ICD-10-CM

## 2021-06-01 DIAGNOSIS — G935 Compression of brain: Secondary | ICD-10-CM | POA: Diagnosis not present

## 2021-06-01 HISTORY — DX: Radiculopathy, lumbar region: M54.16

## 2021-06-01 HISTORY — DX: Syringomyelia and syringobulbia: G95.0

## 2021-06-01 NOTE — Progress Notes (Signed)
    SUBJECTIVE:   CHIEF COMPLAINT / HPI:   Chiari I Malformation Thoracic Syrinx Patient here for follow up after cervical MRI on 05/26/2021 showed partially visualized syrinx in the upper thoracic spinal cord as well as 92mm cerebellar tonsillar ectopia/chiari I malformation.   Reports she is still having daily headaches and neck pain. Symptoms have been present 3-4 months. She was seen by neurology at [facility] and reports she was prescribed Tramadol. The Tramadol helps temporarily but the headache always comes back. States she does not wish to be on Tramadol long term. Her neck pain is diffuse and is worse with any type of movement. Has limited ROM in all directions secondary to pain. No vision changes, nausea/vomiting, photophobia, numbness/tingling, or unilateral weakness. Patient expresses significant frustration and irritable mood/depressed mood as a result of her symptoms.   L Sided Radicular Pain Patient also with pain in her left lower back and buttocks that radiates down her entire L leg. Endorses numbness/tingling below the knee. These symptoms have also been present for a few months. Requests referral to physical therapy.   PERTINENT  PMH / PSH: HTN, HLD  OBJECTIVE:   BP (!) 142/74   Pulse 95   Ht 5\' 7"  (1.702 m)   Wt (!) 304 lb 12.8 oz (138.3 kg)   LMP 10/25/2018   SpO2 97%   BMI 47.74 kg/m   General: NAD, pleasant, able to participate in exam Neck: supple, no deformity or step off, no midline tenderness, decreased ROM in all directions secondary to pain Back: no midline tenderness, moderate tenderness to palpation of L paraspinal muscles in lumbar region, normal flexion/extension/rotation Respiratory: No respiratory distress Skin: warm and dry, no rashes noted Psych: Normal affect and mood Neuro: CN II-XII intact, sensation to light touch intact in bilateral upper and lower extremities, 5/5 strength in all extremities, mildly antalgic gait   ASSESSMENT/PLAN:    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HCC) Not well visualized on cervical MRI due to location in upper thoracic spinal cord. Dedicated thoracic MRI was recommended. -Obtain MRI thoracic spine with and without contrast -Referral to neurosurgery   Lumbar radiculopathy Patient's history and exam are consistent with left sided lumbar radiculopathy. She is reportedly prescribed Tramadol from a neurologist at [facility]. ?possibly related to her thoracic syrinx as well. -Referral to physical therapy -Consider addition of gabapentin if persistent -Referral to neurosurgery as above  Chiari I malformation [facility]) Patient reports daily headaches over the last 3-4 months. Reportedly saw a neurologist at [facility] and was prescribed Tramadol, which helps temporarily. Requests second opinion from neurology. Neuro exam largely unremarkable today. I wonder if there is a large component of tension type headache related to her neck pain/muscle tightness. -Referral to neurology has already been placed, patient apparently cancelled appt with [place] as the wait was too long. Will try to reschedule with [facility] if possible.    Alcus Dad, MD [place]

## 2021-06-01 NOTE — Assessment & Plan Note (Signed)
Not well visualized on cervical MRI due to location in upper thoracic spinal cord. Dedicated thoracic MRI was recommended. -Obtain MRI thoracic spine with and without contrast -Referral to neurosurgery

## 2021-06-01 NOTE — Telephone Encounter (Signed)
Called pt to inform her the info below. [facility] 11/28 Arrive at 1:45 [phone] (if she wants the number)  Ottis Stain, CMA

## 2021-06-01 NOTE — Assessment & Plan Note (Signed)
Patient reports daily headaches over the last 3-4 months. Reportedly saw a neurologist at [facility] and was prescribed Tramadol, which helps temporarily. Requests second opinion from neurology. Neuro exam largely unremarkable today. I wonder if there is a large component of tension type headache related to her neck pain/muscle tightness. -Referral to neurology has already been placed, patient apparently cancelled appt with [place] as the wait was too long. Will try to reschedule with [facility] if possible.

## 2021-06-01 NOTE — Patient Instructions (Signed)
It was great to meet you!  Things we discussed at today's visit: - I have ordered an MRI of your back (lower than the MRI that was done previously). We will call you to schedule once this is approved. - I have placed referrals to neurology and neurosurgery. We will work to get you seen as soon as possible - In the meantime, I have ordered physical therapy. Someone should contact you to set up an appointment.  - For now, continue seeing your neurologist at [facility].  Take care and seek immediate care sooner if you develop any concerns.  Dr. [facility]

## 2021-06-01 NOTE — Assessment & Plan Note (Addendum)
Patient's history and exam are consistent with left sided lumbar radiculopathy. She is reportedly prescribed Tramadol from a neurologist at [facility]. ?possibly related to her thoracic syrinx as well. -Referral to physical therapy -Consider addition of gabapentin if persistent -Referral to neurosurgery as above

## 2021-06-01 NOTE — Telephone Encounter (Signed)
Patient returned call to nurse line. Informed of below.   Talbot Grumbling, RN

## 2021-06-21 ENCOUNTER — Ambulatory Visit (HOSPITAL_COMMUNITY)
Admission: RE | Admit: 2021-06-21 | Discharge: 2021-06-21 | Disposition: A | Discharge status: Home / Self Care | Payer: Medicaid Other | Source: Ambulatory Visit | Attending: Family Medicine | Admitting: Family Medicine

## 2021-06-21 ENCOUNTER — Other Ambulatory Visit: Payer: Self-pay

## 2021-06-21 DIAGNOSIS — G950 Syringomyelia and syringobulbia: Secondary | ICD-10-CM | POA: Diagnosis not present

## 2021-06-21 DIAGNOSIS — M5124 Other intervertebral disc displacement, thoracic region: Secondary | ICD-10-CM | POA: Diagnosis not present

## 2021-06-21 DIAGNOSIS — M419 Scoliosis, unspecified: Secondary | ICD-10-CM | POA: Diagnosis not present

## 2021-06-21 DIAGNOSIS — R200 Anesthesia of skin: Secondary | ICD-10-CM | POA: Diagnosis not present

## 2021-06-21 DIAGNOSIS — M2578 Osteophyte, vertebrae: Secondary | ICD-10-CM | POA: Diagnosis not present

## 2021-06-21 IMAGING — MR MR THORACIC SPINE WO/W CM
5 of 10 series · 17 of 48 positions shown · IV contrast (gadavist)
Comparison: Cervical spine MRI [DATE].  Chest CT [DATE].

CLINICAL DATA: 50-year-old female with partially visible upper
thoracic spinal cord syrinx on cervical spine MRI earlier this
month. Headache, numbness and burning in the extremities.

EXAM:
MRI THORACIC WITHOUT AND WITH CONTRAST
TECHNIQUE: Multiplanar and multiecho pulse sequences of the thoracic spine were
obtained without and with intravenous contrast.
CONTRAST:  10mL GADAVIST GADOBUTROL 1 MMOL/ML IV SOLN

[Series 2: T1 · sagittal · 3.0mm · 0.90mm/px · 1 of 16 slices shown (1 of 3)]
[im 1/16]
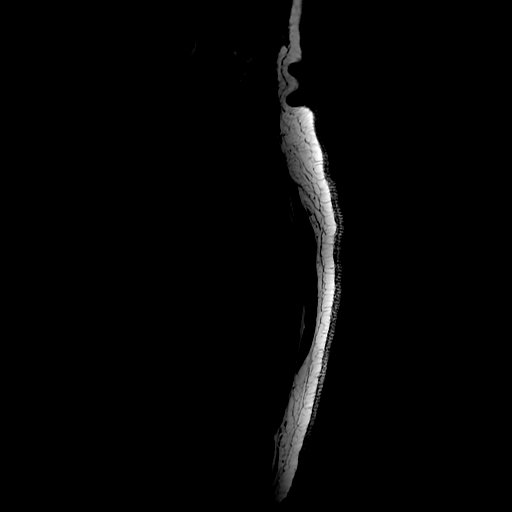

[Series 3: T2 · sagittal · 3.0mm · 0.66mm/px · 3 of 22 slices shown (1 of 2)]
[im 1/22]
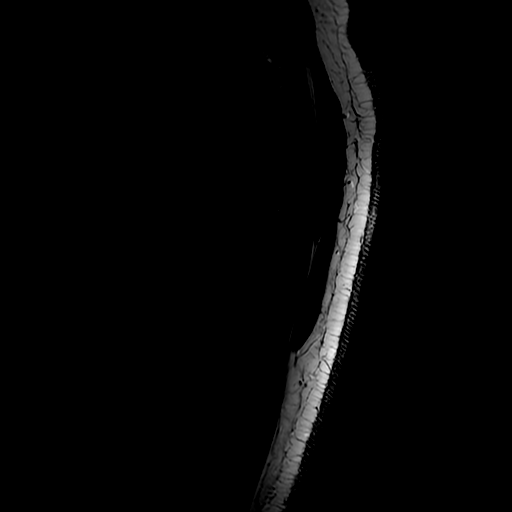
[im 11/22]
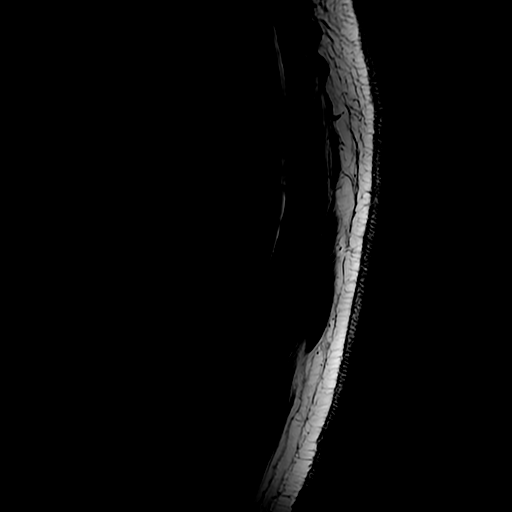
[im 22/22]
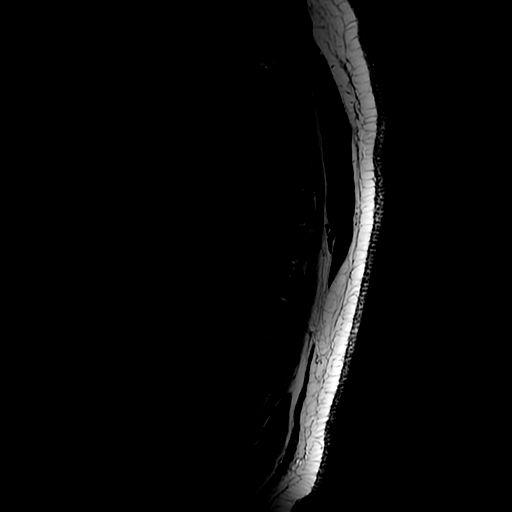

[Series 5: T1 · sagittal · 3.0mm · 0.66mm/px · 3 of 22 slices shown (2 of 3)]
[im 1/22]
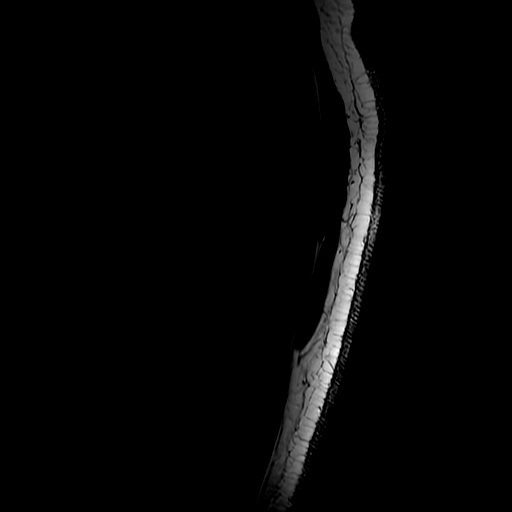
[im 11/22]
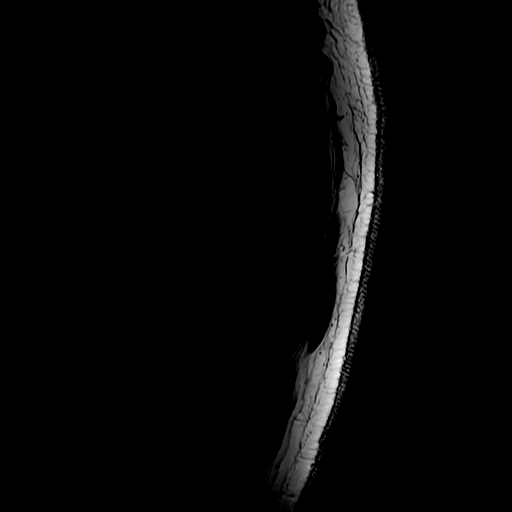
[im 22/22]
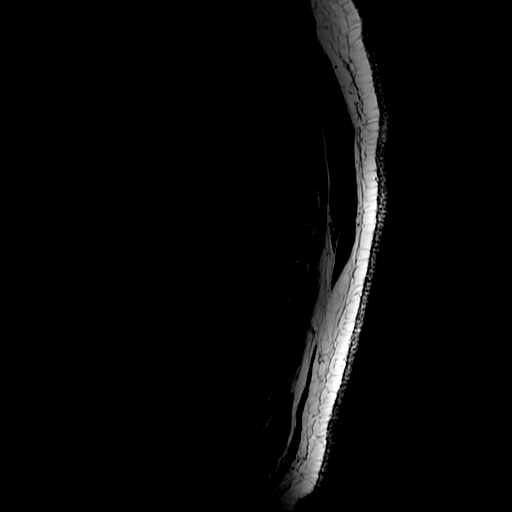

[Series 6: T2 · axial · 4.0mm · 0.39mm/px · z∈[-248,+15]mm · 7 of 50 slices shown (2 of 2)]
[im 1/50]
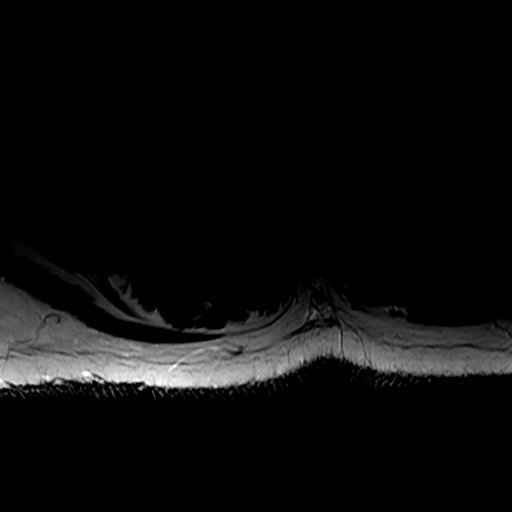
[im 9/50]
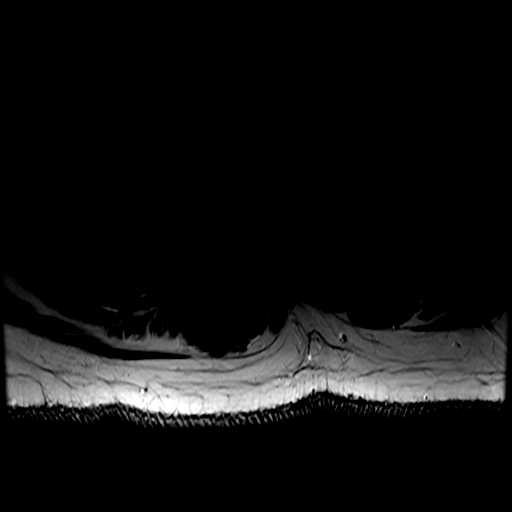
[im 17/50]
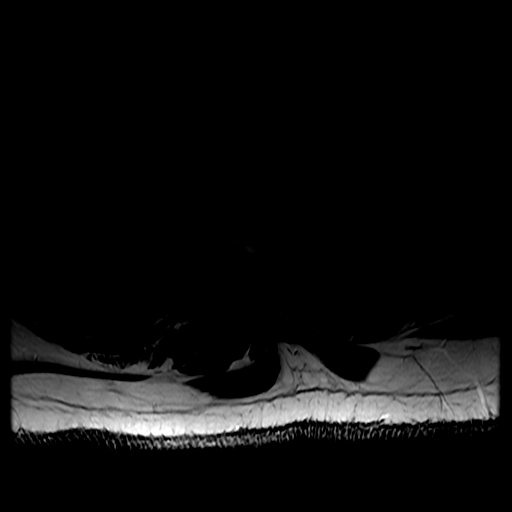
[im 25/50]
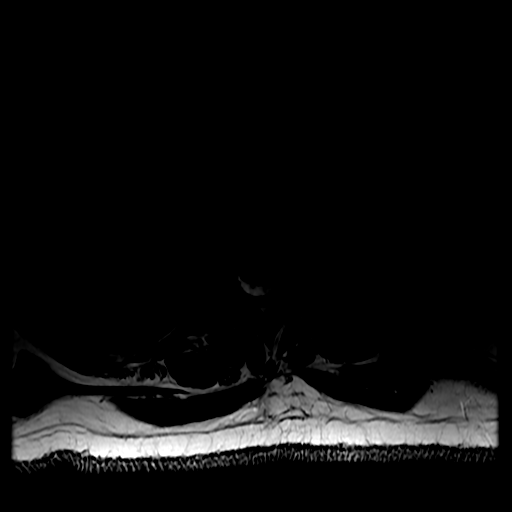
[im 33/50]
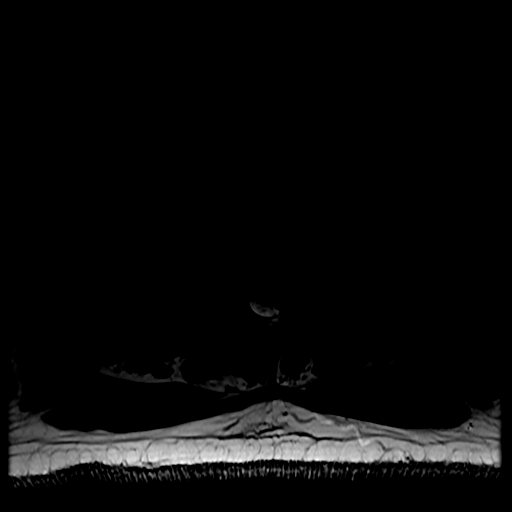
[im 41/50]
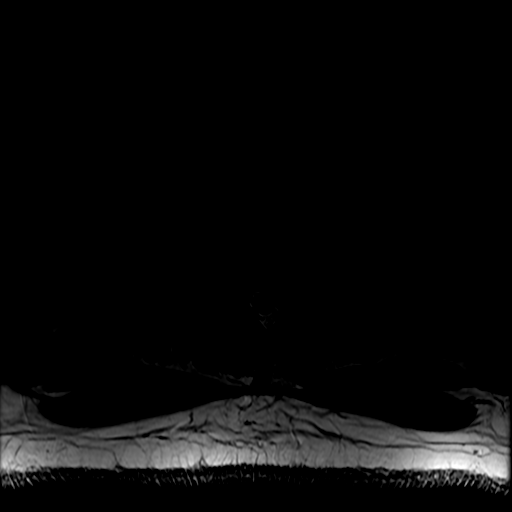
[im 50/50]
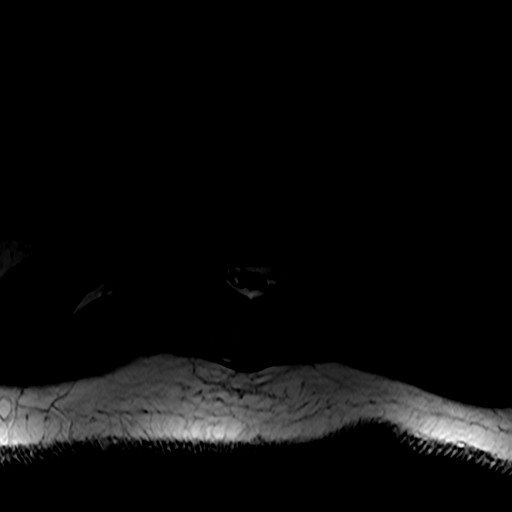

[Series 8: T1 · axial · non-contrast · 4.0mm · 0.39mm/px · z∈[-248,-162]mm · 3 of 50 slices shown (3 of 3)]
[im 1/50]
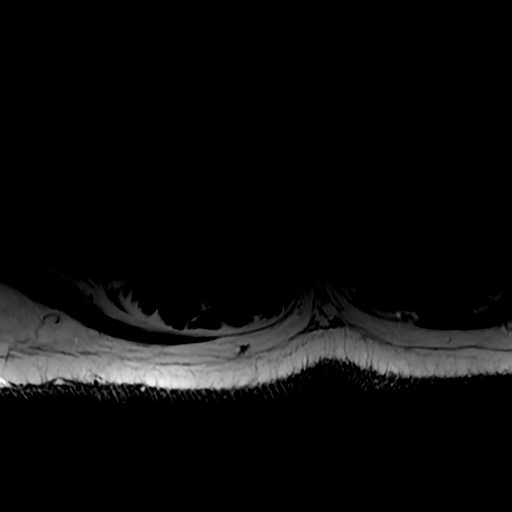
[im 9/50]
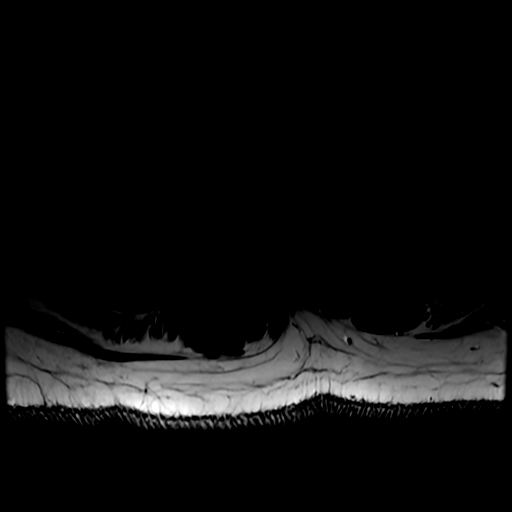
[im 17/50]
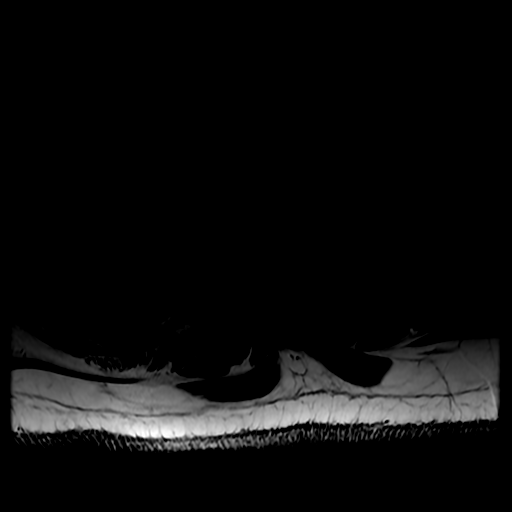

[17 of 48 positions shown; findings below may reference images not displayed]

FINDINGS: Limited cervical spine imaging:  Stable since [DATE].

Thoracic spine segmentation:  Appears to be normal.

Alignment: Mild to moderate reverse S-shaped thoracic scoliosis
demonstrated on the [8L] chest CT, and not significantly changed. No
associated spondylolisthesis.

Vertebrae: Flowing thoracic endplate osteophytes resulting in
interbody ankylosis on the [8L] CT at T4-T5, T5-T6, T7-T8, T9-T10.
No marrow edema or evidence of acute osseous abnormality. Normal
background bone marrow signal.

Cord: Small syrinx within the upper thoracic cord, measures up to
1-2 mm diameter and begins just below a T2-T3 disc herniation (see
below). Extending from the upper T3 through the lower T4 vertebral
levels. Below that there is only minimal central T2 cord signal at
T7-T8 which might be physiologic prominence of the central spinal
canal (series 6, image 30). Normal conus medullaris at T12-L1. No
spinal cord edema or expansion. No abnormal intradural enhancement.
No dural thickening.

Paraspinal and other soft tissues: Negative.

Disc levels:

T1-T2: Negative.

T2-T3: Disc desiccation. Lobulated right paracentral and caudal disc
extrusion which was also demonstrated on the sagittal cervical
images earlier this month. Today see series 3, image 14 and series
6, images 9 and 10 associated rightward endplate spurring. Effaced
ventral CSF space although the dorsal thecal sac remains patent.
There is mild to moderate right T2 foraminal stenosis.

T3-T4: Right foraminal disc osteophyte complex and mild to moderate
right facet hypertrophy. No spinal stenosis but severe right T3
foraminal stenosis.

T4-T5: Moderate to severe right facet hypertrophy. Moderate to
severe right T4 foraminal stenosis.

T5-T6: Moderate to severe right facet hypertrophy. Moderate to
severe right T5 foraminal stenosis.

T6-T7: Minor disc bulging. Moderate right facet hypertrophy and
right T6 foraminal stenosis.

T7-T8: Mild facet hypertrophy on the right. Mild right T7 foraminal
stenosis.

T8-T9: Negative.

T9-T10: Moderate facet hypertrophy on the left with moderate to
severe left T9 foraminal stenosis.

T10-T11: Severe facet hypertrophy on the left and severe left T10
foraminal stenosis.

T11-T12: Mild facet hypertrophy on the left. Mild to moderate left
T11 foraminal stenosis.

T12-L1: Negative.
IMPRESSION: 1. A small syrinx of the upper thoracic spinal cord at T3 and T4
appears to be associated with isolated moderate size thoracic disc
herniation at T2-T3 which contacts the ventral cord. But there is no
significant thoracic spinal stenosis.

2. Underlying chronic thoracic scoliosis and multiple levels of
interbody ankylosis secondary to flowing endplate osteophytes.
Associated multilevel moderate and severe thoracic facet hypertrophy
results in moderate or severe neural foraminal stenosis at the right
T3 through T6 and left T9 through T11 nerve levels.

## 2021-06-21 MED ORDER — GADOBUTROL 1 MMOL/ML IV SOLN
10.0000 mL | Freq: Once | INTRAVENOUS | Status: AC | PRN
  Administered 2021-06-21: 15:00:00 10 mL via INTRAVENOUS

## 2021-06-28 ENCOUNTER — Ambulatory Visit: Payer: Medicaid Other | Attending: Family Medicine

## 2021-06-28 ENCOUNTER — Other Ambulatory Visit: Payer: Self-pay

## 2021-06-28 DIAGNOSIS — M6281 Muscle weakness (generalized): Secondary | ICD-10-CM

## 2021-06-28 DIAGNOSIS — M79662 Pain in left lower leg: Secondary | ICD-10-CM | POA: Diagnosis not present

## 2021-06-28 DIAGNOSIS — G8929 Other chronic pain: Secondary | ICD-10-CM

## 2021-06-28 DIAGNOSIS — M5442 Lumbago with sciatica, left side: Secondary | ICD-10-CM | POA: Diagnosis not present

## 2021-06-28 DIAGNOSIS — M5416 Radiculopathy, lumbar region: Secondary | ICD-10-CM | POA: Diagnosis not present
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
## 2021-07-07 ENCOUNTER — Other Ambulatory Visit: Payer: Self-pay

## 2021-07-07 ENCOUNTER — Ambulatory Visit: Payer: Medicaid Other | Admitting: Physical Therapy

## 2021-07-07 ENCOUNTER — Encounter: Payer: Self-pay | Admitting: Physical Therapy

## 2021-07-07 DIAGNOSIS — M5442 Lumbago with sciatica, left side: Secondary | ICD-10-CM | POA: Diagnosis not present

## 2021-07-07 DIAGNOSIS — G8929 Other chronic pain: Secondary | ICD-10-CM

## 2021-07-07 DIAGNOSIS — M5416 Radiculopathy, lumbar region: Secondary | ICD-10-CM | POA: Diagnosis not present

## 2021-07-07 DIAGNOSIS — M6281 Muscle weakness (generalized): Secondary | ICD-10-CM | POA: Diagnosis not present

## 2021-07-07 DIAGNOSIS — M79662 Pain in left lower leg: Secondary | ICD-10-CM

## 2021-07-07 NOTE — Patient Instructions (Signed)
Access Code: [email or web address] URL: [email or web address] Date: 07/07/2021 Prepared by: Raeford Razor  Exercises Hooklying Single Knee to Chest Stretch - 5 x daily - 7 x weekly - 1 sets - 10 reps Supine Double Knee to Chest Modified - 5 x daily - 7 x weekly - 1 sets - 10 reps Supine Lower Trunk Rotation - 2 x daily - 7 x weekly - 2 sets - 10 reps - 10 hold Seated Abdominal Press into [employer] - 1 x daily - 7 x weekly - 2 sets - 10 reps - 5-10 hold Seated March with Resistance - 1 x daily - 7 x weekly - 2 sets - 10 reps - 5 hold Seated Hip Abduction with Resistance - 1 x daily - 7 x weekly - 2 sets - 10 reps - 5 hold
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
## 2021-07-09 DIAGNOSIS — R200 Anesthesia of skin: Secondary | ICD-10-CM | POA: Diagnosis not present

## 2021-07-09 DIAGNOSIS — G9589 Other specified diseases of spinal cord: Secondary | ICD-10-CM | POA: Diagnosis not present

## 2021-07-09 DIAGNOSIS — M5481 Occipital neuralgia: Secondary | ICD-10-CM | POA: Diagnosis not present

## 2021-07-09 DIAGNOSIS — G8192 Hemiplegia, unspecified affecting left dominant side: Secondary | ICD-10-CM | POA: Diagnosis not present

## 2021-07-09 DIAGNOSIS — M5124 Other intervertebral disc displacement, thoracic region: Secondary | ICD-10-CM | POA: Diagnosis not present

## 2021-07-09 DIAGNOSIS — R4701 Aphasia: Secondary | ICD-10-CM | POA: Diagnosis not present

## 2021-07-09 DIAGNOSIS — R152 Fecal urgency: Secondary | ICD-10-CM | POA: Diagnosis not present

## 2021-07-09 DIAGNOSIS — G935 Compression of brain: Secondary | ICD-10-CM | POA: Diagnosis not present

## 2021-07-09 DIAGNOSIS — M25551 Pain in right hip: Secondary | ICD-10-CM | POA: Diagnosis not present

## 2021-07-09 DIAGNOSIS — R531 Weakness: Secondary | ICD-10-CM | POA: Diagnosis not present

## 2021-07-09 DIAGNOSIS — R262 Difficulty in walking, not elsewhere classified: Secondary | ICD-10-CM | POA: Diagnosis not present

## 2021-07-09 DIAGNOSIS — G950 Syringomyelia and syringobulbia: Secondary | ICD-10-CM | POA: Diagnosis not present

## 2021-07-09 DIAGNOSIS — M79606 Pain in leg, unspecified: Secondary | ICD-10-CM | POA: Diagnosis not present

## 2021-07-09 DIAGNOSIS — I9589 Other hypotension: Secondary | ICD-10-CM | POA: Diagnosis not present

## 2021-07-09 DIAGNOSIS — M25552 Pain in left hip: Secondary | ICD-10-CM | POA: Diagnosis not present

## 2021-07-12 ENCOUNTER — Ambulatory Visit: Payer: Medicaid Other

## 2021-07-12 ENCOUNTER — Other Ambulatory Visit: Payer: Self-pay

## 2021-07-12 DIAGNOSIS — G8929 Other chronic pain: Secondary | ICD-10-CM

## 2021-07-12 DIAGNOSIS — M5416 Radiculopathy, lumbar region: Secondary | ICD-10-CM | POA: Diagnosis not present

## 2021-07-12 DIAGNOSIS — M6281 Muscle weakness (generalized): Secondary | ICD-10-CM | POA: Diagnosis not present

## 2021-07-12 DIAGNOSIS — M79662 Pain in left lower leg: Secondary | ICD-10-CM

## 2021-07-12 DIAGNOSIS — M5442 Lumbago with sciatica, left side: Secondary | ICD-10-CM | POA: Diagnosis not present
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
## 2021-07-14 ENCOUNTER — Other Ambulatory Visit: Payer: Self-pay

## 2021-07-14 ENCOUNTER — Ambulatory Visit: Payer: Medicaid Other

## 2021-07-14 DIAGNOSIS — G8929 Other chronic pain: Secondary | ICD-10-CM

## 2021-07-14 DIAGNOSIS — M79662 Pain in left lower leg: Secondary | ICD-10-CM | POA: Diagnosis not present

## 2021-07-14 DIAGNOSIS — M5442 Lumbago with sciatica, left side: Secondary | ICD-10-CM | POA: Diagnosis not present

## 2021-07-14 DIAGNOSIS — M5416 Radiculopathy, lumbar region: Secondary | ICD-10-CM | POA: Diagnosis not present

## 2021-07-14 DIAGNOSIS — M6281 Muscle weakness (generalized): Secondary | ICD-10-CM | POA: Diagnosis not present
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
## 2021-07-21 ENCOUNTER — Encounter: Payer: Self-pay | Admitting: Physical Therapy

## 2021-07-21 ENCOUNTER — Ambulatory Visit: Payer: Medicaid Other | Admitting: Physical Therapy

## 2021-07-21 ENCOUNTER — Other Ambulatory Visit: Payer: Self-pay

## 2021-07-21 DIAGNOSIS — M5416 Radiculopathy, lumbar region: Secondary | ICD-10-CM | POA: Diagnosis not present

## 2021-07-21 DIAGNOSIS — M79662 Pain in left lower leg: Secondary | ICD-10-CM

## 2021-07-21 DIAGNOSIS — M5442 Lumbago with sciatica, left side: Secondary | ICD-10-CM

## 2021-07-21 DIAGNOSIS — M6281 Muscle weakness (generalized): Secondary | ICD-10-CM | POA: Diagnosis not present

## 2021-07-21 DIAGNOSIS — G8929 Other chronic pain: Secondary | ICD-10-CM | POA: Diagnosis not present
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
## 2021-07-23 ENCOUNTER — Other Ambulatory Visit: Payer: Self-pay

## 2021-07-23 ENCOUNTER — Encounter: Payer: Self-pay | Admitting: Physical Therapy

## 2021-07-23 ENCOUNTER — Ambulatory Visit: Payer: Medicaid Other | Admitting: Physical Therapy

## 2021-07-23 DIAGNOSIS — M6281 Muscle weakness (generalized): Secondary | ICD-10-CM

## 2021-07-23 DIAGNOSIS — G8929 Other chronic pain: Secondary | ICD-10-CM | POA: Diagnosis not present

## 2021-07-23 DIAGNOSIS — M5416 Radiculopathy, lumbar region: Secondary | ICD-10-CM | POA: Diagnosis not present

## 2021-07-23 DIAGNOSIS — M5442 Lumbago with sciatica, left side: Secondary | ICD-10-CM | POA: Diagnosis not present

## 2021-07-23 DIAGNOSIS — M79662 Pain in left lower leg: Secondary | ICD-10-CM
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
## 2021-07-27 ENCOUNTER — Other Ambulatory Visit: Payer: Self-pay

## 2021-07-27 ENCOUNTER — Ambulatory Visit: Payer: Medicaid Other | Attending: Family Medicine

## 2021-07-27 DIAGNOSIS — M79662 Pain in left lower leg: Secondary | ICD-10-CM | POA: Diagnosis present

## 2021-07-27 DIAGNOSIS — M5442 Lumbago with sciatica, left side: Secondary | ICD-10-CM | POA: Insufficient documentation

## 2021-07-27 DIAGNOSIS — L732 Hidradenitis suppurativa: Secondary | ICD-10-CM | POA: Diagnosis not present

## 2021-07-27 DIAGNOSIS — M6281 Muscle weakness (generalized): Secondary | ICD-10-CM | POA: Insufficient documentation

## 2021-07-27 DIAGNOSIS — G8929 Other chronic pain: Secondary | ICD-10-CM | POA: Diagnosis present
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
## 2021-07-29 ENCOUNTER — Ambulatory Visit: Payer: Medicaid Other

## 2021-07-29 ENCOUNTER — Other Ambulatory Visit: Payer: Self-pay

## 2021-07-29 DIAGNOSIS — M79662 Pain in left lower leg: Secondary | ICD-10-CM

## 2021-07-29 DIAGNOSIS — G8929 Other chronic pain: Secondary | ICD-10-CM

## 2021-07-29 DIAGNOSIS — M5442 Lumbago with sciatica, left side: Secondary | ICD-10-CM | POA: Diagnosis not present

## 2021-07-29 DIAGNOSIS — M6281 Muscle weakness (generalized): Secondary | ICD-10-CM
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
## 2021-08-03 ENCOUNTER — Ambulatory Visit: Payer: Medicaid Other | Admitting: Physical Therapy

## 2021-08-03 ENCOUNTER — Other Ambulatory Visit: Payer: Self-pay

## 2021-08-03 ENCOUNTER — Encounter: Payer: Self-pay | Admitting: Physical Therapy

## 2021-08-03 DIAGNOSIS — M5442 Lumbago with sciatica, left side: Secondary | ICD-10-CM | POA: Diagnosis not present

## 2021-08-03 DIAGNOSIS — G8929 Other chronic pain: Secondary | ICD-10-CM

## 2021-08-03 DIAGNOSIS — M6281 Muscle weakness (generalized): Secondary | ICD-10-CM

## 2021-08-03 DIAGNOSIS — M79662 Pain in left lower leg: Secondary | ICD-10-CM
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
## 2021-08-06 ENCOUNTER — Encounter: Payer: Self-pay | Admitting: Family Medicine

## 2021-08-06 ENCOUNTER — Ambulatory Visit: Payer: Medicaid Other | Admitting: Physical Therapy

## 2021-08-06 ENCOUNTER — Ambulatory Visit: Payer: Medicaid Other | Admitting: Family Medicine

## 2021-08-06 ENCOUNTER — Other Ambulatory Visit: Payer: Self-pay

## 2021-08-06 ENCOUNTER — Encounter: Payer: Self-pay | Admitting: Physical Therapy

## 2021-08-06 DIAGNOSIS — G8929 Other chronic pain: Secondary | ICD-10-CM

## 2021-08-06 DIAGNOSIS — M79662 Pain in left lower leg: Secondary | ICD-10-CM

## 2021-08-06 DIAGNOSIS — M5416 Radiculopathy, lumbar region: Secondary | ICD-10-CM

## 2021-08-06 DIAGNOSIS — M5442 Lumbago with sciatica, left side: Secondary | ICD-10-CM | POA: Diagnosis not present

## 2021-08-06 DIAGNOSIS — M6281 Muscle weakness (generalized): Secondary | ICD-10-CM

## 2021-08-06 NOTE — Patient Instructions (Signed)
Thank you for coming to see me today. It was a pleasure. Today we discussed your hip and leg pain. I am glad your symptoms are better with physical therapy. Recommend you continue it long term. I do not think an MRI lumbar spine is urgent now. Follow up with the neurosurgeon to see if he would like you to have the MRI lumbar spine. If so I am happy to place it-let me know via mychart.   If you have any questions or concerns, please do not hesitate to call the office at [phone].  Best wishes,   Dr Posey Pronto

## 2021-08-06 NOTE — Progress Notes (Signed)
° ° ° °  SUBJECTIVE:   CHIEF COMPLAINT / HPI:   Stacy Campos is a 51 y.o. female presents for   Lumbar radiculopathy Was experiencing left lower back pain and numbness, radiating to the left foot. Has been doing physical therapy since Dec twice a week. Noticed improved mobility and pain. Today is the first day she has not required a walker. She discussed with neurosurgery with regards to getting a lumbar MRI who said to discuss with PCP.  [facility] Office Visit from 06/01/2021 in [place]  PHQ-9 Total Score 17         PERTINENT  PMH / PSH: spinal cord syrinx, chronic bilaterally   OBJECTIVE:   BP (!) 151/96    Pulse 97    Ht 5\' 7"  (1.702 m)    Wt (!) 310 lb 12.8 oz (141 kg)    LMP 10/25/2018    SpO2 99%    BMI 48.68 kg/m    General: Alert, no acute distress Cardio: Normal S1 and S2, RRR, no r/m/g Pulm: CTAB, normal work of breathing Abdomen: Bowel sounds normal. Abdomen soft and non-tender.  Extremities: No peripheral edema.  Neuro: Cranial nerves grossly intact   Back Normal skin, Spine with normal alignment and no deformity.  No tenderness to vertebral process palpation Left paraspinous muscles are tender and with spasm.  Range of motion is full at neck and limited at lumbar sacral regions. Negative straight leg test.   ASSESSMENT/PLAN:   Lumbar radiculopathy Back pain significantly improved with physical therapy. No indication at present for lumbar MRI, however pt will discuss the need for this with neurosurgery. I am happy to place an order for it if neurosurgery feel it is necessary. Recommended continuing therapy. Follow up as needed.    Lattie Haw, MD PGY-3 [place]
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
## 2021-08-11 ENCOUNTER — Encounter: Payer: Self-pay | Admitting: Physical Therapy

## 2021-08-11 ENCOUNTER — Ambulatory Visit: Payer: Medicaid Other

## 2021-08-11 ENCOUNTER — Ambulatory Visit: Payer: Medicaid Other | Admitting: Physical Therapy

## 2021-08-11 ENCOUNTER — Other Ambulatory Visit: Payer: Self-pay

## 2021-08-11 DIAGNOSIS — M79662 Pain in left lower leg: Secondary | ICD-10-CM

## 2021-08-11 DIAGNOSIS — M6281 Muscle weakness (generalized): Secondary | ICD-10-CM

## 2021-08-11 DIAGNOSIS — M5442 Lumbago with sciatica, left side: Secondary | ICD-10-CM | POA: Diagnosis not present

## 2021-08-11 DIAGNOSIS — G8929 Other chronic pain: Secondary | ICD-10-CM
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
## 2021-08-12 NOTE — Assessment & Plan Note (Addendum)
Back pain significantly improved with physical therapy. No indication at present for lumbar MRI, however pt will discuss the need for this with neurosurgery. I am happy to place an order for it if neurosurgery feel it is necessary. Recommended continuing therapy. Follow up as needed.

## 2021-08-13 ENCOUNTER — Other Ambulatory Visit: Payer: Self-pay

## 2021-08-13 ENCOUNTER — Encounter: Payer: Self-pay | Admitting: Physical Therapy

## 2021-08-13 ENCOUNTER — Ambulatory Visit: Payer: Medicaid Other | Admitting: Physical Therapy

## 2021-08-13 DIAGNOSIS — M5442 Lumbago with sciatica, left side: Secondary | ICD-10-CM | POA: Diagnosis not present

## 2021-08-13 DIAGNOSIS — M79662 Pain in left lower leg: Secondary | ICD-10-CM

## 2021-08-13 DIAGNOSIS — M6281 Muscle weakness (generalized): Secondary | ICD-10-CM

## 2021-08-13 DIAGNOSIS — G8929 Other chronic pain: Secondary | ICD-10-CM
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
## 2021-08-16 DIAGNOSIS — M5442 Lumbago with sciatica, left side: Secondary | ICD-10-CM | POA: Diagnosis not present

## 2021-08-16 DIAGNOSIS — E669 Obesity, unspecified: Secondary | ICD-10-CM | POA: Diagnosis not present

## 2021-08-16 DIAGNOSIS — G4440 Drug-induced headache, not elsewhere classified, not intractable: Secondary | ICD-10-CM | POA: Diagnosis not present

## 2021-08-16 DIAGNOSIS — G43719 Chronic migraine without aura, intractable, without status migrainosus: Secondary | ICD-10-CM | POA: Diagnosis not present

## 2021-08-16 DIAGNOSIS — R930 Abnormal findings on diagnostic imaging of skull and head, not elsewhere classified: Secondary | ICD-10-CM | POA: Diagnosis not present

## 2021-08-16 DIAGNOSIS — M542 Cervicalgia: Secondary | ICD-10-CM | POA: Diagnosis not present

## 2021-08-16 DIAGNOSIS — G935 Compression of brain: Secondary | ICD-10-CM | POA: Diagnosis not present

## 2021-08-16 DIAGNOSIS — M5481 Occipital neuralgia: Secondary | ICD-10-CM | POA: Diagnosis not present

## 2021-08-16 DIAGNOSIS — M25562 Pain in left knee: Secondary | ICD-10-CM | POA: Diagnosis not present

## 2021-08-16 DIAGNOSIS — R937 Abnormal findings on diagnostic imaging of other parts of musculoskeletal system: Secondary | ICD-10-CM | POA: Diagnosis not present

## 2021-08-16 DIAGNOSIS — G478 Other sleep disorders: Secondary | ICD-10-CM | POA: Diagnosis not present

## 2021-08-16 DIAGNOSIS — G5603 Carpal tunnel syndrome, bilateral upper limbs: Secondary | ICD-10-CM | POA: Diagnosis not present

## 2021-08-16 DIAGNOSIS — G43709 Chronic migraine without aura, not intractable, without status migrainosus: Secondary | ICD-10-CM | POA: Diagnosis not present

## 2021-08-16 DIAGNOSIS — Z6841 Body Mass Index (BMI) 40.0 and over, adult: Secondary | ICD-10-CM | POA: Diagnosis not present

## 2021-08-18 ENCOUNTER — Other Ambulatory Visit: Payer: Self-pay

## 2021-08-18 ENCOUNTER — Ambulatory Visit: Payer: Medicaid Other

## 2021-08-18 DIAGNOSIS — M5442 Lumbago with sciatica, left side: Secondary | ICD-10-CM | POA: Diagnosis not present

## 2021-08-18 DIAGNOSIS — G8929 Other chronic pain: Secondary | ICD-10-CM

## 2021-08-18 DIAGNOSIS — M6281 Muscle weakness (generalized): Secondary | ICD-10-CM

## 2021-08-18 DIAGNOSIS — M79662 Pain in left lower leg: Secondary | ICD-10-CM
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
## 2021-08-19 DIAGNOSIS — G5603 Carpal tunnel syndrome, bilateral upper limbs: Secondary | ICD-10-CM | POA: Diagnosis not present

## 2021-08-19 DIAGNOSIS — M5416 Radiculopathy, lumbar region: Secondary | ICD-10-CM | POA: Diagnosis not present

## 2021-08-20 ENCOUNTER — Ambulatory Visit: Payer: Medicaid Other | Admitting: Physical Therapy

## 2021-08-20 ENCOUNTER — Encounter: Payer: Self-pay | Admitting: Physical Therapy

## 2021-08-20 ENCOUNTER — Other Ambulatory Visit: Payer: Self-pay

## 2021-08-20 DIAGNOSIS — M79662 Pain in left lower leg: Secondary | ICD-10-CM

## 2021-08-20 DIAGNOSIS — M6281 Muscle weakness (generalized): Secondary | ICD-10-CM

## 2021-08-20 DIAGNOSIS — M5442 Lumbago with sciatica, left side: Secondary | ICD-10-CM | POA: Diagnosis not present

## 2021-08-20 DIAGNOSIS — G8929 Other chronic pain: Secondary | ICD-10-CM

## 2021-08-20 NOTE — Patient Instructions (Signed)
Aquatic Therapy at [facility]-  What to Expect!  Where:   [facility] [address] [place], [place] [address] Rehab phone [phone]  NOTE:  You will receive an automated phone message reminding you of your appt and it will say the appointment is at the [address] [facility] [address] [facility] clinic.          How to Prepare: Please make sure you drink 8 ounces of water about one hour prior to your pool session A caregiver may attend if needed with the patient to help assist as needed. A caregiver can sit in the pool room on chair. Please arrive IN YOUR SUIT and 15 minutes prior to your appointment - this helps to avoid delays in starting your session. Please make sure to attend to any toileting needs prior to entering the pool Locker rooms for changing are provided.   There is direct access to the pool deck form the locker room.  You can lock your belongings in a locker with lock provided. Once on the pool deck your therapist will ask if you have signed the Patient  Consent and Assignment of Benefits form before beginning treatment Your therapist may take your blood pressure prior to, during and after your session if indicated We usually try and create a home exercise program based on activities we do in the pool.  Please be thinking about who might be able to assist you in the pool should you need to participate in an aquatic home exercise program at the time of discharge if you need assistance.  Some patients do not want to or do not have the ability to participate in an aquatic home program - this is not a barrier in any way to you participating in aquatic therapy as part of your current therapy plan! After Discharge from PT, you can continue using home program at  the [facility]/, there is a drop-in fee for $5 ($45 a month)or for 60 years  or older $4.00 ($40 a month for seniors ) or any local [employer] pool.  Memberships for purchase are  available for gym/pool at [facility]  IT IS VERY IMPORTANT THAT YOUR LAST VISIT BE IN THE CLINIC AT [address] AFTER YOUR LAST AQUATIC VISIT.  PLEASE MAKE SURE THAT YOU HAVE A [address]  APPOINTMENT SCHEDULED.   About the pool: Pool is located approximately 500 FT from the entrance of the building.  Please bring a support person if you need assistance traveling this      distance.   Your therapist will assist you in entering the water; there are two ways to           enter: stairs with railings, and a mechanical lift. Your therapist will determine the most appropriate way for you.  Water temperature is usually between 88-90 degrees  There may be up to 2 other swimmers in the pool at the same time  The pool deck is tile, please wear shoes with good traction if you prefer not to be barefoot.    Contact Info:  For appointment scheduling and cancellations:         Please call the [facility]  PH:[phone]              Aquatic Therapy  [facility]       All sessions are 45 minutes                                                    
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
## 2021-08-23 ENCOUNTER — Ambulatory Visit: Payer: Medicaid Other

## 2021-08-23 ENCOUNTER — Other Ambulatory Visit: Payer: Self-pay

## 2021-08-23 MED ORDER — ROSUVASTATIN CALCIUM 5 MG PO TABS: 5.0000 mg | ORAL_TABLET | Freq: Every day | ORAL | 2 refills | Status: DC

## 2021-08-25 ENCOUNTER — Other Ambulatory Visit: Payer: Self-pay

## 2021-08-25 ENCOUNTER — Ambulatory Visit: Payer: Medicaid Other | Attending: Family Medicine

## 2021-08-25 DIAGNOSIS — G8929 Other chronic pain: Secondary | ICD-10-CM | POA: Insufficient documentation

## 2021-08-25 DIAGNOSIS — M5442 Lumbago with sciatica, left side: Secondary | ICD-10-CM | POA: Diagnosis present

## 2021-08-25 DIAGNOSIS — M6281 Muscle weakness (generalized): Secondary | ICD-10-CM | POA: Diagnosis present

## 2021-08-25 DIAGNOSIS — M79662 Pain in left lower leg: Secondary | ICD-10-CM | POA: Diagnosis present
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
## 2021-08-27 ENCOUNTER — Encounter: Payer: Self-pay | Admitting: Physical Therapy

## 2021-08-27 ENCOUNTER — Other Ambulatory Visit: Payer: Self-pay

## 2021-08-27 ENCOUNTER — Ambulatory Visit: Payer: Medicaid Other | Admitting: Physical Therapy

## 2021-08-27 DIAGNOSIS — M6281 Muscle weakness (generalized): Secondary | ICD-10-CM

## 2021-08-27 DIAGNOSIS — M5442 Lumbago with sciatica, left side: Secondary | ICD-10-CM | POA: Diagnosis not present

## 2021-08-27 DIAGNOSIS — G8929 Other chronic pain: Secondary | ICD-10-CM

## 2021-08-27 DIAGNOSIS — M79662 Pain in left lower leg: Secondary | ICD-10-CM
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
## 2021-09-01 ENCOUNTER — Ambulatory Visit: Payer: Medicaid Other

## 2021-09-01 ENCOUNTER — Other Ambulatory Visit: Payer: Self-pay

## 2021-09-01 DIAGNOSIS — G8929 Other chronic pain: Secondary | ICD-10-CM

## 2021-09-01 DIAGNOSIS — M5442 Lumbago with sciatica, left side: Secondary | ICD-10-CM | POA: Diagnosis not present

## 2021-09-01 DIAGNOSIS — M79662 Pain in left lower leg: Secondary | ICD-10-CM

## 2021-09-01 DIAGNOSIS — M6281 Muscle weakness (generalized): Secondary | ICD-10-CM
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
## 2021-09-01 NOTE — Patient Instructions (Signed)
Aquatic Therapy at [facility]-  What to Expect!  Where:   [facility] [address] [facility] [place], [place] [address] Rehab phone [phone]  NOTE:  You will receive an automated phone message reminding you of your appt and it will say the appointment is at the [address] [facility] [address] [facility] clinic.          How to Prepare: Please make sure you drink 8 ounces of water about one hour prior to your pool session A caregiver may attend if needed with the patient to help assist as needed. A caregiver can sit in the pool room on chair. Please arrive IN YOUR SUIT and 15 minutes prior to your appointment - this helps to avoid delays in starting your session. Please make sure to attend to any toileting needs prior to entering the pool Locker rooms for changing are provided.   There is direct access to the pool deck form the locker room.  You can lock your belongings in a locker with lock provided. Once on the pool deck your therapist will ask if you have signed the Patient  Consent and Assignment of Benefits form before beginning treatment Your therapist may take your blood pressure prior to, during and after your session if indicated We usually try and create a home exercise program based on activities we do in the pool.  Please be thinking about who might be able to assist you in the pool should you need to participate in an aquatic home exercise program at the time of discharge if you need assistance.  Some patients do not want to or do not have the ability to participate in an aquatic home program - this is not a barrier in any way to you participating in aquatic therapy as part of your current therapy plan! After Discharge from PT, you can continue using home program at  the [facility]/, there is a drop-in fee for $5 ($45 a month)or for 60 years  or older $4.00 ($40 a month for seniors ) or any local [facility] pool.  Memberships for purchase are  available for gym/pool at [facility]  IT IS VERY IMPORTANT THAT YOUR LAST VISIT BE IN THE CLINIC AT [address] AFTER YOUR LAST AQUATIC VISIT.  PLEASE MAKE SURE THAT YOU HAVE A [address]  APPOINTMENT SCHEDULED.   About the pool: Pool is located approximately 500 FT from the entrance of the building.  Please bring a support person if you need assistance traveling this      distance.   Your therapist will assist you in entering the water; there are two ways to           enter: stairs with railings, and a mechanical lift. Your therapist will determine the most appropriate way for you.  Water temperature is usually between 88-90 degrees  There may be up to 2 other swimmers in the pool at the same time  The pool deck is tile, please wear shoes with good traction if you prefer not to be barefoot.    Contact Info:  For appointment scheduling and cancellations:         Please call the [facility]  PH:[phone]              Aquatic Therapy  [facility]patient [facility]       All sessions are 45 minutes                                                    

## 2021-09-06 ENCOUNTER — Ambulatory Visit: Payer: Medicaid Other | Admitting: Physical Therapy

## 2021-09-06 DIAGNOSIS — M5117 Intervertebral disc disorders with radiculopathy, lumbosacral region: Secondary | ICD-10-CM | POA: Diagnosis not present

## 2021-09-06 DIAGNOSIS — M48061 Spinal stenosis, lumbar region without neurogenic claudication: Secondary | ICD-10-CM | POA: Diagnosis not present

## 2021-09-08 ENCOUNTER — Encounter (HOSPITAL_BASED_OUTPATIENT_CLINIC_OR_DEPARTMENT_OTHER): Payer: Self-pay | Admitting: Physical Therapy

## 2021-09-08 ENCOUNTER — Other Ambulatory Visit: Payer: Self-pay

## 2021-09-08 ENCOUNTER — Ambulatory Visit (HOSPITAL_BASED_OUTPATIENT_CLINIC_OR_DEPARTMENT_OTHER): Payer: Medicaid Other | Attending: Family Medicine | Admitting: Physical Therapy

## 2021-09-08 DIAGNOSIS — M79662 Pain in left lower leg: Secondary | ICD-10-CM

## 2021-09-08 DIAGNOSIS — M5416 Radiculopathy, lumbar region: Secondary | ICD-10-CM | POA: Diagnosis not present

## 2021-09-08 DIAGNOSIS — M6281 Muscle weakness (generalized): Secondary | ICD-10-CM

## 2021-09-08 DIAGNOSIS — G8929 Other chronic pain: Secondary | ICD-10-CM
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
## 2021-09-10 ENCOUNTER — Encounter: Payer: Self-pay | Admitting: Physical Therapy

## 2021-09-10 ENCOUNTER — Other Ambulatory Visit: Payer: Self-pay

## 2021-09-10 ENCOUNTER — Ambulatory Visit: Payer: Medicaid Other | Admitting: Physical Therapy

## 2021-09-10 DIAGNOSIS — G8929 Other chronic pain: Secondary | ICD-10-CM

## 2021-09-10 DIAGNOSIS — M6281 Muscle weakness (generalized): Secondary | ICD-10-CM

## 2021-09-10 DIAGNOSIS — M5442 Lumbago with sciatica, left side: Secondary | ICD-10-CM | POA: Diagnosis not present

## 2021-09-10 DIAGNOSIS — M79662 Pain in left lower leg: Secondary | ICD-10-CM
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
## 2021-09-11 DIAGNOSIS — G43719 Chronic migraine without aura, intractable, without status migrainosus: Secondary | ICD-10-CM | POA: Diagnosis not present

## 2021-09-11 DIAGNOSIS — R930 Abnormal findings on diagnostic imaging of skull and head, not elsewhere classified: Secondary | ICD-10-CM | POA: Diagnosis not present

## 2021-09-11 DIAGNOSIS — G935 Compression of brain: Secondary | ICD-10-CM | POA: Diagnosis not present

## 2021-09-11 DIAGNOSIS — R937 Abnormal findings on diagnostic imaging of other parts of musculoskeletal system: Secondary | ICD-10-CM | POA: Diagnosis not present

## 2021-09-11 DIAGNOSIS — M542 Cervicalgia: Secondary | ICD-10-CM | POA: Diagnosis not present

## 2021-09-13 ENCOUNTER — Ambulatory Visit: Payer: Medicaid Other | Admitting: Physical Therapy

## 2021-09-14 ENCOUNTER — Other Ambulatory Visit: Payer: Self-pay

## 2021-09-14 ENCOUNTER — Ambulatory Visit: Payer: Medicaid Other

## 2021-09-14 DIAGNOSIS — M5442 Lumbago with sciatica, left side: Secondary | ICD-10-CM | POA: Diagnosis not present

## 2021-09-14 DIAGNOSIS — M79662 Pain in left lower leg: Secondary | ICD-10-CM

## 2021-09-14 DIAGNOSIS — G8929 Other chronic pain: Secondary | ICD-10-CM

## 2021-09-14 DIAGNOSIS — M6281 Muscle weakness (generalized): Secondary | ICD-10-CM
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
## 2021-09-20 ENCOUNTER — Ambulatory Visit: Payer: Medicaid Other

## 2021-09-20 ENCOUNTER — Other Ambulatory Visit: Payer: Self-pay

## 2021-09-20 DIAGNOSIS — M79662 Pain in left lower leg: Secondary | ICD-10-CM

## 2021-09-20 DIAGNOSIS — M5442 Lumbago with sciatica, left side: Secondary | ICD-10-CM | POA: Diagnosis not present

## 2021-09-20 DIAGNOSIS — G8929 Other chronic pain: Secondary | ICD-10-CM

## 2021-09-20 DIAGNOSIS — M6281 Muscle weakness (generalized): Secondary | ICD-10-CM
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
## 2021-09-21 DIAGNOSIS — G8929 Other chronic pain: Secondary | ICD-10-CM | POA: Diagnosis not present

## 2021-09-21 DIAGNOSIS — M5416 Radiculopathy, lumbar region: Secondary | ICD-10-CM | POA: Diagnosis not present

## 2021-09-21 DIAGNOSIS — M4186 Other forms of scoliosis, lumbar region: Secondary | ICD-10-CM | POA: Diagnosis not present

## 2021-09-21 DIAGNOSIS — M47816 Spondylosis without myelopathy or radiculopathy, lumbar region: Secondary | ICD-10-CM | POA: Diagnosis not present

## 2021-09-21 DIAGNOSIS — M48061 Spinal stenosis, lumbar region without neurogenic claudication: Secondary | ICD-10-CM | POA: Diagnosis not present

## 2021-09-21 DIAGNOSIS — M5136 Other intervertebral disc degeneration, lumbar region: Secondary | ICD-10-CM | POA: Diagnosis not present

## 2021-09-21 DIAGNOSIS — M5137 Other intervertebral disc degeneration, lumbosacral region: Secondary | ICD-10-CM | POA: Diagnosis not present

## 2021-09-21 DIAGNOSIS — M4807 Spinal stenosis, lumbosacral region: Secondary | ICD-10-CM | POA: Diagnosis not present

## 2021-09-21 DIAGNOSIS — Z6841 Body Mass Index (BMI) 40.0 and over, adult: Secondary | ICD-10-CM | POA: Diagnosis not present

## 2021-09-21 DIAGNOSIS — M79605 Pain in left leg: Secondary | ICD-10-CM | POA: Diagnosis not present

## 2021-09-21 DIAGNOSIS — M5117 Intervertebral disc disorders with radiculopathy, lumbosacral region: Secondary | ICD-10-CM | POA: Diagnosis not present

## 2021-09-21 DIAGNOSIS — E669 Obesity, unspecified: Secondary | ICD-10-CM | POA: Diagnosis not present

## 2021-09-21 DIAGNOSIS — M2578 Osteophyte, vertebrae: Secondary | ICD-10-CM | POA: Diagnosis not present

## 2021-09-21 DIAGNOSIS — M5442 Lumbago with sciatica, left side: Secondary | ICD-10-CM | POA: Diagnosis not present
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
## 2021-09-22 ENCOUNTER — Other Ambulatory Visit: Payer: Self-pay

## 2021-09-22 ENCOUNTER — Ambulatory Visit: Payer: Medicaid Other | Attending: Family Medicine

## 2021-09-22 DIAGNOSIS — M79662 Pain in left lower leg: Secondary | ICD-10-CM | POA: Diagnosis not present

## 2021-09-22 DIAGNOSIS — M5442 Lumbago with sciatica, left side: Secondary | ICD-10-CM | POA: Insufficient documentation

## 2021-09-22 DIAGNOSIS — M6281 Muscle weakness (generalized): Secondary | ICD-10-CM | POA: Diagnosis not present

## 2021-09-22 DIAGNOSIS — G8929 Other chronic pain: Secondary | ICD-10-CM | POA: Diagnosis not present

## 2021-09-27 ENCOUNTER — Encounter (HOSPITAL_BASED_OUTPATIENT_CLINIC_OR_DEPARTMENT_OTHER): Payer: Self-pay | Admitting: Physical Therapy

## 2021-09-27 ENCOUNTER — Ambulatory Visit (HOSPITAL_BASED_OUTPATIENT_CLINIC_OR_DEPARTMENT_OTHER): Payer: Medicaid Other | Attending: Family Medicine | Admitting: Physical Therapy

## 2021-09-27 ENCOUNTER — Other Ambulatory Visit: Payer: Self-pay

## 2021-09-27 DIAGNOSIS — M79662 Pain in left lower leg: Secondary | ICD-10-CM | POA: Diagnosis not present

## 2021-09-27 DIAGNOSIS — G8929 Other chronic pain: Secondary | ICD-10-CM | POA: Diagnosis not present

## 2021-09-27 DIAGNOSIS — M5442 Lumbago with sciatica, left side: Secondary | ICD-10-CM | POA: Diagnosis not present

## 2021-09-27 DIAGNOSIS — M6281 Muscle weakness (generalized): Secondary | ICD-10-CM | POA: Insufficient documentation
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
## 2021-09-29 ENCOUNTER — Ambulatory Visit: Payer: Medicaid Other

## 2021-10-04 ENCOUNTER — Other Ambulatory Visit: Payer: Self-pay

## 2021-10-04 ENCOUNTER — Ambulatory Visit: Payer: Medicaid Other

## 2021-10-04 DIAGNOSIS — M6281 Muscle weakness (generalized): Secondary | ICD-10-CM | POA: Diagnosis not present

## 2021-10-04 DIAGNOSIS — M79662 Pain in left lower leg: Secondary | ICD-10-CM | POA: Diagnosis not present

## 2021-10-04 DIAGNOSIS — M5442 Lumbago with sciatica, left side: Secondary | ICD-10-CM | POA: Diagnosis not present

## 2021-10-04 DIAGNOSIS — G8929 Other chronic pain: Secondary | ICD-10-CM | POA: Diagnosis not present
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
## 2021-10-06 ENCOUNTER — Ambulatory Visit (HOSPITAL_BASED_OUTPATIENT_CLINIC_OR_DEPARTMENT_OTHER): Payer: Medicaid Other | Admitting: Physical Therapy

## 2021-10-08 DIAGNOSIS — G5603 Carpal tunnel syndrome, bilateral upper limbs: Secondary | ICD-10-CM | POA: Diagnosis not present

## 2021-10-13 ENCOUNTER — Ambulatory Visit (HOSPITAL_BASED_OUTPATIENT_CLINIC_OR_DEPARTMENT_OTHER): Payer: Medicaid Other | Admitting: Physical Therapy

## 2021-10-18 DIAGNOSIS — M5416 Radiculopathy, lumbar region: Secondary | ICD-10-CM | POA: Diagnosis not present

## 2021-10-18 DIAGNOSIS — G8929 Other chronic pain: Secondary | ICD-10-CM | POA: Diagnosis not present

## 2021-10-20 ENCOUNTER — Ambulatory Visit (HOSPITAL_BASED_OUTPATIENT_CLINIC_OR_DEPARTMENT_OTHER): Payer: Medicaid Other | Admitting: Physical Therapy

## 2021-10-20 DIAGNOSIS — G5603 Carpal tunnel syndrome, bilateral upper limbs: Secondary | ICD-10-CM | POA: Diagnosis not present

## 2021-10-20 DIAGNOSIS — M79641 Pain in right hand: Secondary | ICD-10-CM | POA: Diagnosis not present

## 2021-10-20 DIAGNOSIS — M79642 Pain in left hand: Secondary | ICD-10-CM | POA: Diagnosis not present

## 2021-10-20 DIAGNOSIS — R2232 Localized swelling, mass and lump, left upper limb: Secondary | ICD-10-CM | POA: Diagnosis not present
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
## 2021-10-25 ENCOUNTER — Encounter: Payer: Self-pay | Admitting: Physical Therapy

## 2021-10-25 ENCOUNTER — Ambulatory Visit: Payer: Medicaid Other | Attending: Family Medicine | Admitting: Physical Therapy

## 2021-10-25 DIAGNOSIS — M6281 Muscle weakness (generalized): Secondary | ICD-10-CM | POA: Insufficient documentation

## 2021-10-25 DIAGNOSIS — M79662 Pain in left lower leg: Secondary | ICD-10-CM | POA: Insufficient documentation

## 2021-10-25 DIAGNOSIS — G8929 Other chronic pain: Secondary | ICD-10-CM | POA: Diagnosis not present

## 2021-10-25 DIAGNOSIS — M5442 Lumbago with sciatica, left side: Secondary | ICD-10-CM | POA: Insufficient documentation

## 2021-10-25 DIAGNOSIS — M542 Cervicalgia: Secondary | ICD-10-CM | POA: Insufficient documentation

## 2021-10-26 DIAGNOSIS — L732 Hidradenitis suppurativa: Secondary | ICD-10-CM | POA: Diagnosis not present

## 2021-10-27 DIAGNOSIS — R2232 Localized swelling, mass and lump, left upper limb: Secondary | ICD-10-CM | POA: Diagnosis not present

## 2021-10-31 ENCOUNTER — Encounter: Payer: Self-pay | Admitting: Physical Therapy

## 2021-11-03 ENCOUNTER — Ambulatory Visit: Payer: Medicaid Other | Admitting: Physical Therapy
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
## 2021-11-08 ENCOUNTER — Ambulatory Visit: Payer: Medicaid Other | Admitting: Physical Therapy

## 2021-11-08 DIAGNOSIS — G8929 Other chronic pain: Secondary | ICD-10-CM | POA: Diagnosis not present

## 2021-11-08 DIAGNOSIS — M6281 Muscle weakness (generalized): Secondary | ICD-10-CM

## 2021-11-08 DIAGNOSIS — M542 Cervicalgia: Secondary | ICD-10-CM | POA: Diagnosis not present

## 2021-11-08 DIAGNOSIS — M79662 Pain in left lower leg: Secondary | ICD-10-CM | POA: Diagnosis not present

## 2021-11-08 DIAGNOSIS — M5442 Lumbago with sciatica, left side: Secondary | ICD-10-CM | POA: Diagnosis not present

## 2021-11-10 ENCOUNTER — Other Ambulatory Visit: Payer: Self-pay | Admitting: Orthopedic Surgery

## 2021-11-10 DIAGNOSIS — M67442 Ganglion, left hand: Secondary | ICD-10-CM | POA: Diagnosis not present

## 2021-11-15 ENCOUNTER — Encounter: Payer: Medicaid Other | Admitting: Physical Therapy

## 2021-11-15 DIAGNOSIS — G478 Other sleep disorders: Secondary | ICD-10-CM | POA: Diagnosis not present

## 2021-11-15 DIAGNOSIS — R21 Rash and other nonspecific skin eruption: Secondary | ICD-10-CM | POA: Diagnosis not present

## 2021-11-15 DIAGNOSIS — G43909 Migraine, unspecified, not intractable, without status migrainosus: Secondary | ICD-10-CM | POA: Diagnosis not present

## 2021-11-15 DIAGNOSIS — N62 Hypertrophy of breast: Secondary | ICD-10-CM | POA: Diagnosis not present

## 2021-11-15 DIAGNOSIS — M542 Cervicalgia: Secondary | ICD-10-CM | POA: Diagnosis not present

## 2021-11-17 ENCOUNTER — Telehealth: Payer: Self-pay | Admitting: Cardiovascular Disease

## 2021-11-17 NOTE — Telephone Encounter (Signed)
I tried to call the opt though her vm is full. I called pt's daughter Francisca December (DPR). She stated she will call the pt and tell her to call our office and ask with the pre op team for tele pre op appt.  ?

## 2021-11-17 NOTE — Telephone Encounter (Signed)
Preoperative team, please contact this patient and set up a phone call appointment for further preoperative risk assessment. Please obtain consent and complete medication review. Thank you for your help.  ? ?Seems patient takes ASA for  CVA.  ?

## 2021-11-17 NOTE — Telephone Encounter (Signed)
? ?  Pre-operative Risk Assessment  ?  ?Patient Name: Stacy Campos  ?DOB: [birth year] ?MRN: [id number]  ? ?  ? ?Request for Surgical Clearance   ? ?Procedure:   Excision Cyst Left Palm ? ?Date of Surgery:  Clearance 11/25/21                              ?   ?Surgeon:  Dr. Leanora Cover ?Surgeon's Group or Practice Name:  [place] [facility] ?Phone number:  [phone] ?Fax number:  [phone] ?  ?Type of Clearance Requested:   ?- Medical  ?  ?Type of Anesthesia:   IV Regional ?  ?Additional requests/questions:  Please advise surgeon/provider what medications should be held. - Aspirin ? ?Signed, ?Belisicia T Harris   ?11/17/2021, 11:34 AM  ? ?
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
## 2021-11-18 ENCOUNTER — Ambulatory Visit: Payer: Medicaid Other | Admitting: Physical Therapy

## 2021-11-18 DIAGNOSIS — M6281 Muscle weakness (generalized): Secondary | ICD-10-CM | POA: Diagnosis not present

## 2021-11-18 DIAGNOSIS — M542 Cervicalgia: Secondary | ICD-10-CM | POA: Diagnosis not present

## 2021-11-18 DIAGNOSIS — G8929 Other chronic pain: Secondary | ICD-10-CM

## 2021-11-18 DIAGNOSIS — M5442 Lumbago with sciatica, left side: Secondary | ICD-10-CM | POA: Diagnosis not present

## 2021-11-18 DIAGNOSIS — M79662 Pain in left lower leg: Secondary | ICD-10-CM | POA: Diagnosis not present

## 2021-11-22 ENCOUNTER — Other Ambulatory Visit: Payer: Self-pay

## 2021-11-22 ENCOUNTER — Telehealth: Payer: Self-pay | Admitting: *Deleted

## 2021-11-22 ENCOUNTER — Encounter (HOSPITAL_BASED_OUTPATIENT_CLINIC_OR_DEPARTMENT_OTHER): Payer: Self-pay | Admitting: Orthopedic Surgery

## 2021-11-22 NOTE — Progress Notes (Signed)
Hassan Rowan called at Dr. Levell July office regarding patients aspirin orders. ?

## 2021-11-22 NOTE — Telephone Encounter (Signed)
Patient was returning call for appt ?

## 2021-11-22 NOTE — Telephone Encounter (Signed)
Pt agreeable to plan of care for tele pre op appt 11/23/21 @ 1 pm. Med rec and consent are done.  ? ?I will send FYI to Dr. Leanora Cover office that the pt has a telephone appt for pre op 11/23/21 @ 1 pm ?

## 2021-11-22 NOTE — Progress Notes (Signed)
Reviewed chart with Dr. Smith Robert, okay to proceed with surgery at [facility] with current BMI. ?

## 2021-11-22 NOTE — Telephone Encounter (Signed)
Pt agreeable to plan of care for tele pre op appt 11/23/21 @ 1 pm. Med rec and consent are done.  ? ?  ?Patient Consent for Virtual Visit  ? ? ?   ? ?Stacy Campos has provided verbal consent on 11/22/2021 for a virtual visit (video or telephone). ? ? ?CONSENT FOR VIRTUAL VISIT FOR:  Stacy Campos  ?By participating in this virtual visit I agree to the following: ? ?I hereby voluntarily request, consent and authorize [facility] and its employed or contracted physicians, physician assistants, nurse practitioners or other licensed health care professionals (the Practitioner), to provide me with telemedicine health care services (the ?Services") as deemed necessary by the treating Practitioner. I acknowledge and consent to receive the Services by the Practitioner via telemedicine. I understand that the telemedicine visit will involve communicating with the Practitioner through live audiovisual communication technology and the disclosure of certain medical information by electronic transmission. I acknowledge that I have been given the opportunity to request an in-person assessment or other available alternative prior to the telemedicine visit and am voluntarily participating in the telemedicine visit. ? ?I understand that I have the right to withhold or withdraw my consent to the use of telemedicine in the course of my care at any time, without affecting my right to future care or treatment, and that the Practitioner or I may terminate the telemedicine visit at any time. I understand that I have the right to inspect all information obtained and/or recorded in the course of the telemedicine visit and may receive copies of available information for a reasonable fee.  I understand that some of the potential risks of receiving the Services via telemedicine include:  ?Delay or interruption in medical evaluation due to technological equipment failure or disruption; ?Information transmitted may not be sufficient (e.g. poor  resolution of images) to allow for appropriate medical decision making by the Practitioner; and/or  ?In rare instances, security protocols could fail, causing a breach of personal health information. ? ?Furthermore, I acknowledge that it is my responsibility to provide information about my medical history, conditions and care that is complete and accurate to the best of my ability. I acknowledge that Practitioner's advice, recommendations, and/or decision may be based on factors not within their control, such as incomplete or inaccurate data provided by me or distortions of diagnostic images or specimens that may result from electronic transmissions. I understand that the practice of medicine is not an exact science and that Practitioner makes no warranties or guarantees regarding treatment outcomes. I acknowledge that a copy of this consent can be made available to me via my patient portal ([facility]), or I can request a printed copy by calling the office of [facility].   ? ?I understand that my insurance will be billed for this visit.  ? ?I have read or had this consent read to me. ?I understand the contents of this consent, which adequately explains the benefits and risks of the Services being provided via telemedicine.  ?I have been provided ample opportunity to ask questions regarding this consent and the Services and have had my questions answered to my satisfaction. ?I give my informed consent for the services to be provided through the use of telemedicine in my medical care ? ? ? ?

## 2021-11-23 ENCOUNTER — Telehealth: Payer: Self-pay | Admitting: Cardiovascular Disease

## 2021-11-23 ENCOUNTER — Telehealth: Payer: Self-pay | Admitting: Physical Therapy

## 2021-11-23 ENCOUNTER — Encounter: Payer: Self-pay | Admitting: Nurse Practitioner

## 2021-11-23 ENCOUNTER — Telehealth: Payer: Self-pay

## 2021-11-23 ENCOUNTER — Ambulatory Visit: Payer: Medicaid Other | Attending: Family Medicine | Admitting: Physical Therapy

## 2021-11-23 ENCOUNTER — Ambulatory Visit (INDEPENDENT_AMBULATORY_CARE_PROVIDER_SITE_OTHER): Payer: Medicaid Other | Admitting: Nurse Practitioner

## 2021-11-23 DIAGNOSIS — Z01810 Encounter for preprocedural cardiovascular examination: Secondary | ICD-10-CM

## 2021-11-23 DIAGNOSIS — G8929 Other chronic pain: Secondary | ICD-10-CM | POA: Insufficient documentation

## 2021-11-23 DIAGNOSIS — M79662 Pain in left lower leg: Secondary | ICD-10-CM | POA: Insufficient documentation

## 2021-11-23 DIAGNOSIS — M542 Cervicalgia: Secondary | ICD-10-CM | POA: Insufficient documentation

## 2021-11-23 DIAGNOSIS — M5442 Lumbago with sciatica, left side: Secondary | ICD-10-CM | POA: Insufficient documentation

## 2021-11-23 DIAGNOSIS — M6281 Muscle weakness (generalized): Secondary | ICD-10-CM | POA: Insufficient documentation

## 2021-11-23 NOTE — Telephone Encounter (Signed)
Stacy Campos with [facility] calls nurse line requesting aspirin regime prior to surgery.  ? ?Stacy Campos reports unfortunately the surgery is scheduled for 5/4 and they were hoping Cardiology would be able to assist with aspirin. However, cards deferred to [place].   ? ?Patient reported taking one aspirin per day to pre op nurse.  ? ?Will forward to PCP.  ? ?Stacy Campos will be checking with Neurology as well.  ?

## 2021-11-23 NOTE — Telephone Encounter (Signed)
Transferred call to nurse

## 2021-11-23 NOTE — Telephone Encounter (Signed)
Hassan Rowan from Dr. Fredna Dow office called about ASA hold time. I s/w d/w Coletta Memos, FNP pre op provider today. Per FNP cardiology does not manage ASA for the pt. Pt was placed on ASA for CVA. I have let Hassan Rowan know this and she will reach out to PCP about ASA. I will fax this note to surgeon office as FYI from today's call ?
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
## 2021-11-23 NOTE — Telephone Encounter (Signed)
Contacted patient regarding no show to appointment today. She already called the front desk to reschedule the appointment. She was reminded of the attendance policy and verbalized understanding.  ?

## 2021-11-23 NOTE — Telephone Encounter (Signed)
This has been addressed.

## 2021-11-24 ENCOUNTER — Encounter: Payer: Self-pay | Admitting: Physical Therapy

## 2021-11-24 ENCOUNTER — Ambulatory Visit: Payer: Medicaid Other | Admitting: Physical Therapy

## 2021-11-24 DIAGNOSIS — M79662 Pain in left lower leg: Secondary | ICD-10-CM | POA: Diagnosis present

## 2021-11-24 DIAGNOSIS — M542 Cervicalgia: Secondary | ICD-10-CM | POA: Diagnosis present

## 2021-11-24 DIAGNOSIS — M6281 Muscle weakness (generalized): Secondary | ICD-10-CM | POA: Diagnosis present

## 2021-11-24 DIAGNOSIS — M5442 Lumbago with sciatica, left side: Secondary | ICD-10-CM | POA: Diagnosis present

## 2021-11-24 DIAGNOSIS — G8929 Other chronic pain: Secondary | ICD-10-CM | POA: Diagnosis present
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
## 2021-11-24 NOTE — Telephone Encounter (Signed)
LVM for Hassan Rowan to call back to give her the below information. Laylanie Kruczek Zimmerman Rumple, CMA ? ?

## 2021-11-24 NOTE — Telephone Encounter (Signed)
Patient should continue aspirin given history of CVA. Please let [place] know. ? ?Stacy Singh, MD  ?Family Medicine Teaching Service  ? ?

## 2021-11-24 NOTE — Telephone Encounter (Signed)
Stacy Campos LVM on nurse line reporting they have already addressed this with a different provider.  ? ?She appreciates [place] calling her back.  ?

## 2021-11-25 ENCOUNTER — Encounter (HOSPITAL_BASED_OUTPATIENT_CLINIC_OR_DEPARTMENT_OTHER): Payer: Self-pay | Admitting: Orthopedic Surgery

## 2021-11-25 ENCOUNTER — Ambulatory Visit (HOSPITAL_BASED_OUTPATIENT_CLINIC_OR_DEPARTMENT_OTHER): Payer: Medicaid Other | Admitting: Anesthesiology

## 2021-11-25 ENCOUNTER — Encounter (HOSPITAL_BASED_OUTPATIENT_CLINIC_OR_DEPARTMENT_OTHER)
Admission: RE | Disposition: A | Discharge status: Home / Self Care | Payer: Self-pay | Source: Home / Self Care | Attending: Orthopedic Surgery

## 2021-11-25 ENCOUNTER — Ambulatory Visit (HOSPITAL_BASED_OUTPATIENT_CLINIC_OR_DEPARTMENT_OTHER)
Admission: RE | Admit: 2021-11-25 | Discharge: 2021-11-25 | Disposition: A | Discharge status: Home / Self Care | Payer: Medicaid Other | Attending: Orthopedic Surgery | Admitting: Orthopedic Surgery

## 2021-11-25 ENCOUNTER — Other Ambulatory Visit: Payer: Self-pay

## 2021-11-25 DIAGNOSIS — Z6841 Body Mass Index (BMI) 40.0 and over, adult: Secondary | ICD-10-CM | POA: Diagnosis not present

## 2021-11-25 DIAGNOSIS — M67442 Ganglion, left hand: Secondary | ICD-10-CM | POA: Diagnosis not present

## 2021-11-25 DIAGNOSIS — M1990 Unspecified osteoarthritis, unspecified site: Secondary | ICD-10-CM | POA: Diagnosis not present

## 2021-11-25 DIAGNOSIS — I10 Essential (primary) hypertension: Secondary | ICD-10-CM | POA: Diagnosis not present

## 2021-11-25 DIAGNOSIS — Z87891 Personal history of nicotine dependence: Secondary | ICD-10-CM | POA: Diagnosis not present

## 2021-11-25 DIAGNOSIS — Z79899 Other long term (current) drug therapy: Secondary | ICD-10-CM | POA: Insufficient documentation

## 2021-11-25 DIAGNOSIS — M71342 Other bursal cyst, left hand: Secondary | ICD-10-CM | POA: Diagnosis not present

## 2021-11-25 DIAGNOSIS — L729 Follicular cyst of the skin and subcutaneous tissue, unspecified: Secondary | ICD-10-CM | POA: Diagnosis not present

## 2021-11-25 DIAGNOSIS — F419 Anxiety disorder, unspecified: Secondary | ICD-10-CM | POA: Diagnosis not present

## 2021-11-25 DIAGNOSIS — R2232 Localized swelling, mass and lump, left upper limb: Secondary | ICD-10-CM | POA: Diagnosis present

## 2021-11-25 HISTORY — PX: CYST EXCISION: SHX5701

## 2021-11-25 SURGERY — CYST REMOVAL
Anesthesia: General | Site: Hand | Laterality: Left

## 2021-11-25 MED ORDER — BUPIVACAINE HCL (PF) 0.25 % IJ SOLN
INTRAMUSCULAR | Status: AC
  Filled 2021-11-25: qty 30

## 2021-11-25 MED ORDER — KETOROLAC TROMETHAMINE 30 MG/ML IJ SOLN
30.0000 mg | Freq: Once | INTRAMUSCULAR | Status: AC
Start: 2021-11-25 — End: 2021-11-25
  Administered 2021-11-25: 30 mg via INTRAVENOUS

## 2021-11-25 MED ORDER — DEXAMETHASONE SODIUM PHOSPHATE 10 MG/ML IJ SOLN
INTRAMUSCULAR | Status: AC
  Filled 2021-11-25: qty 1

## 2021-11-25 MED ORDER — LIDOCAINE HCL (PF) 1 % IJ SOLN
INTRAMUSCULAR | Status: AC
  Filled 2021-11-25: qty 30

## 2021-11-25 MED ORDER — PROPOFOL 10 MG/ML IV BOLUS
INTRAVENOUS | Status: DC | PRN
  Administered 2021-11-25: 200 mg via INTRAVENOUS

## 2021-11-25 MED ORDER — KETOROLAC TROMETHAMINE 30 MG/ML IJ SOLN
INTRAMUSCULAR | Status: AC
  Filled 2021-11-25: qty 1

## 2021-11-25 MED ORDER — AMISULPRIDE (ANTIEMETIC) 5 MG/2ML IV SOLN: 10.0000 mg | Freq: Once | INTRAVENOUS | Status: DC | PRN

## 2021-11-25 MED ORDER — 0.9 % SODIUM CHLORIDE (POUR BTL) OPTIME
TOPICAL | Status: DC | PRN
  Administered 2021-11-25: 60 mL

## 2021-11-25 MED ORDER — OXYCODONE HCL 5 MG PO TABS: 5.0000 mg | ORAL_TABLET | Freq: Once | ORAL | Status: DC | PRN

## 2021-11-25 MED ORDER — CEFAZOLIN IN SODIUM CHLORIDE 3-0.9 GM/100ML-% IV SOLN
INTRAVENOUS | Status: AC
  Filled 2021-11-25: qty 100

## 2021-11-25 MED ORDER — OXYCODONE HCL 5 MG/5ML PO SOLN: 5.0000 mg | Freq: Once | ORAL | Status: DC | PRN

## 2021-11-25 MED ORDER — LACTATED RINGERS IV SOLN: INTRAVENOUS | Status: DC | PRN

## 2021-11-25 MED ORDER — PROPOFOL 10 MG/ML IV BOLUS
INTRAVENOUS | Status: AC
  Filled 2021-11-25: qty 20

## 2021-11-25 MED ORDER — MIDAZOLAM HCL 2 MG/2ML IJ SOLN
INTRAMUSCULAR | Status: AC
  Filled 2021-11-25: qty 2

## 2021-11-25 MED ORDER — FENTANYL CITRATE (PF) 100 MCG/2ML IJ SOLN: 25.0000 ug | INTRAMUSCULAR | Status: DC | PRN

## 2021-11-25 MED ORDER — LACTATED RINGERS IV SOLN: INTRAVENOUS | Status: DC

## 2021-11-25 MED ORDER — FENTANYL CITRATE (PF) 100 MCG/2ML IJ SOLN
INTRAMUSCULAR | Status: DC | PRN
  Administered 2021-11-25: 50 ug via INTRAVENOUS

## 2021-11-25 MED ORDER — BUPIVACAINE HCL (PF) 0.25 % IJ SOLN
INTRAMUSCULAR | Status: DC | PRN
  Administered 2021-11-25: 9 mL

## 2021-11-25 MED ORDER — DEXAMETHASONE SODIUM PHOSPHATE 10 MG/ML IJ SOLN
INTRAMUSCULAR | Status: DC | PRN
  Administered 2021-11-25: 5 mg via INTRAVENOUS

## 2021-11-25 MED ORDER — HYDROCODONE-ACETAMINOPHEN 5-325 MG PO TABS
ORAL_TABLET | ORAL | 0 refills | Status: DC
Start: 2021-11-25 — End: 2022-04-15

## 2021-11-25 MED ORDER — ACETAMINOPHEN 10 MG/ML IV SOLN: 1000.0000 mg | Freq: Once | INTRAVENOUS | Status: DC | PRN

## 2021-11-25 MED ORDER — LIDOCAINE HCL (CARDIAC) PF 100 MG/5ML IV SOSY
PREFILLED_SYRINGE | INTRAVENOUS | Status: DC | PRN
  Administered 2021-11-25: 100 mg via INTRAVENOUS

## 2021-11-25 MED ORDER — FENTANYL CITRATE (PF) 100 MCG/2ML IJ SOLN
INTRAMUSCULAR | Status: AC
  Filled 2021-11-25: qty 2

## 2021-11-25 MED ORDER — LIDOCAINE 2% (20 MG/ML) 5 ML SYRINGE
INTRAMUSCULAR | Status: AC
  Filled 2021-11-25: qty 5

## 2021-11-25 MED ORDER — CEFAZOLIN SODIUM-DEXTROSE 2-3 GM-%(50ML) IV SOLR
INTRAVENOUS | Status: DC | PRN
  Administered 2021-11-25: 3 g via INTRAVENOUS

## 2021-11-25 MED ORDER — ONDANSETRON HCL 4 MG/2ML IJ SOLN
INTRAMUSCULAR | Status: AC
  Filled 2021-11-25: qty 2

## 2021-11-25 MED ORDER — ONDANSETRON HCL 4 MG/2ML IJ SOLN
INTRAMUSCULAR | Status: DC | PRN
  Administered 2021-11-25: 4 mg via INTRAVENOUS

## 2021-11-25 MED ORDER — CEFAZOLIN IN SODIUM CHLORIDE 3-0.9 GM/100ML-% IV SOLN: 3.0000 g | INTRAVENOUS | Status: DC

## 2021-11-25 SURGICAL SUPPLY — 50 items
APL PRP STRL LF DISP 70% ISPRP (MISCELLANEOUS) ×1
APL SKNCLS STERI-STRIP NONHPOA (GAUZE/BANDAGES/DRESSINGS)
BENZOIN TINCTURE PRP APPL 2/3 (GAUZE/BANDAGES/DRESSINGS) IMPLANT
BLADE MINI RND TIP GREEN BEAV (BLADE) IMPLANT
BLADE SURG 15 STRL LF DISP TIS (BLADE) ×2 IMPLANT
BLADE SURG 15 STRL SS (BLADE) ×4
BNDG CMPR 5X2 CHSV 1 LYR STRL (GAUZE/BANDAGES/DRESSINGS)
BNDG CMPR 9X4 STRL LF SNTH (GAUZE/BANDAGES/DRESSINGS)
BNDG COHESIVE 1X5 TAN STRL LF (GAUZE/BANDAGES/DRESSINGS) IMPLANT
BNDG COHESIVE 2X5 TAN ST LF (GAUZE/BANDAGES/DRESSINGS) IMPLANT
BNDG CONFORM 2 STRL LF (GAUZE/BANDAGES/DRESSINGS) IMPLANT
BNDG ELASTIC 2X5.8 VLCR STR LF (GAUZE/BANDAGES/DRESSINGS) IMPLANT
BNDG ELASTIC 3X5.8 VLCR STR LF (GAUZE/BANDAGES/DRESSINGS) IMPLANT
BNDG ESMARK 4X9 LF (GAUZE/BANDAGES/DRESSINGS) IMPLANT
BNDG GAUZE 1X2.1 STRL (MISCELLANEOUS) IMPLANT
BNDG GAUZE ELAST 4 BULKY (GAUZE/BANDAGES/DRESSINGS) IMPLANT
BNDG PLASTER X FAST 3X3 WHT LF (CAST SUPPLIES) IMPLANT
BNDG PLSTR 9X3 FST ST WHT (CAST SUPPLIES)
CHLORAPREP W/TINT 26 (MISCELLANEOUS) ×2 IMPLANT
CORD BIPOLAR FORCEPS 12FT (ELECTRODE) ×2 IMPLANT
COVER BACK TABLE 60X90IN (DRAPES) ×2 IMPLANT
COVER MAYO STAND STRL (DRAPES) ×2 IMPLANT
CUFF TOURN SGL QUICK 18X4 (TOURNIQUET CUFF) ×2 IMPLANT
DRAPE EXTREMITY T 121X128X90 (DISPOSABLE) ×2 IMPLANT
DRAPE SURG 17X23 STRL (DRAPES) ×2 IMPLANT
GAUZE SPONGE 4X4 12PLY STRL (GAUZE/BANDAGES/DRESSINGS) ×2 IMPLANT
GAUZE XEROFORM 1X8 LF (GAUZE/BANDAGES/DRESSINGS) ×2 IMPLANT
GLOVE BIO SURGEON STRL SZ7.5 (GLOVE) ×2 IMPLANT
GLOVE BIOGEL PI IND STRL 8 (GLOVE) ×1 IMPLANT
GLOVE BIOGEL PI INDICATOR 8 (GLOVE) ×1
GOWN STRL REUS W/ TWL LRG LVL3 (GOWN DISPOSABLE) ×1 IMPLANT
GOWN STRL REUS W/TWL LRG LVL3 (GOWN DISPOSABLE) ×2
NDL HYPO 25X1 1.5 SAFETY (NEEDLE) ×1 IMPLANT
NEEDLE HYPO 25X1 1.5 SAFETY (NEEDLE) ×2 IMPLANT
NS IRRIG 1000ML POUR BTL (IV SOLUTION) ×2 IMPLANT
PACK BASIN DAY SURGERY FS (CUSTOM PROCEDURE TRAY) ×2 IMPLANT
PAD CAST 3X4 CTTN HI CHSV (CAST SUPPLIES) IMPLANT
PAD CAST 4YDX4 CTTN HI CHSV (CAST SUPPLIES) IMPLANT
PADDING CAST ABS 4INX4YD NS (CAST SUPPLIES) ×1
PADDING CAST ABS COTTON 4X4 ST (CAST SUPPLIES) ×1 IMPLANT
PADDING CAST COTTON 3X4 STRL (CAST SUPPLIES)
PADDING CAST COTTON 4X4 STRL (CAST SUPPLIES)
STOCKINETTE 4X48 STRL (DRAPES) ×2 IMPLANT
STRIP CLOSURE SKIN 1/2X4 (GAUZE/BANDAGES/DRESSINGS) IMPLANT
SUT ETHILON 3 0 PS 1 (SUTURE) IMPLANT
SUT ETHILON 4 0 PS 2 18 (SUTURE) ×2 IMPLANT
SYR BULB EAR ULCER 3OZ GRN STR (SYRINGE) ×2 IMPLANT
SYR CONTROL 10ML LL (SYRINGE) ×2 IMPLANT
TOWEL GREEN STERILE FF (TOWEL DISPOSABLE) ×4 IMPLANT
UNDERPAD 30X36 HEAVY ABSORB (UNDERPADS AND DIAPERS) ×2 IMPLANT

## 2021-11-25 NOTE — Anesthesia Postprocedure Evaluation (Signed)
Anesthesia Post Note ? ?Patient: Stacy Campos ? ?Procedure(s) Performed: excision cyst left palm (Left: Hand) ? ?  ? ?Patient location during evaluation: PACU ?Anesthesia Type: General ?Level of consciousness: awake ?Pain management: pain level controlled ?Vital Signs Assessment: post-procedure vital signs reviewed and stable ?Respiratory status: spontaneous breathing, nonlabored ventilation, respiratory function stable and patient connected to nasal cannula oxygen ?Cardiovascular status: blood pressure returned to baseline and stable ?Postop Assessment: no apparent nausea or vomiting ?Anesthetic complications: no ? ? ?No notable events documented. ? ?Last Vitals:  ?Vitals:  ? 11/25/21 1100 11/25/21 1140  ?BP:  (!) 151/89  ?Pulse: 81 81  ?Resp:  16  ?Temp:  36.7 ?C  ?SpO2: 99% 99%  ?  ?Last Pain:  ?Vitals:  ? 11/25/21 1140  ?TempSrc:   ?PainSc: 0-No pain  ? ? ?  ?  ?  ?  ?  ?  ? ?Elijio Staples P Aili Casillas ? ? ? ? ?
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
## 2021-11-25 NOTE — Anesthesia Procedure Notes (Signed)
Procedure Name: LMA Insertion ?Date/Time: 11/25/2021 9:51 AM ?Performed by: Verita Lamb, CRNA ?Pre-anesthesia Checklist: Patient identified, Emergency Drugs available, Suction available and Patient being monitored ?Patient Re-evaluated:Patient Re-evaluated prior to induction ?Oxygen Delivery Method: Circle system utilized ?Preoxygenation: Pre-oxygenation with 100% oxygen ?Induction Type: IV induction ?Ventilation: Mask ventilation without difficulty ?LMA: LMA inserted ?LMA Size: 5.0 ?Number of attempts: 1 ?Airway Equipment and Method: Bite block ?Placement Confirmation: positive ETCO2, CO2 detector and breath sounds checked- equal and bilateral ?Tube secured with: Tape ?Dental Injury: Teeth and Oropharynx as per pre-operative assessment  ? ? ? ? ?

## 2021-11-25 NOTE — Op Note (Signed)
NAME: Stacy Campos ?MEDICAL RECORD NO: [id number] ?DATE OF BIRTH: Dec 25, 1970 ?FACILITY: [facility] ?LOCATION: [place] ?PHYSICIAN: Tennis Must, MD ?  ?OPERATIVE REPORT ?  ?DATE OF PROCEDURE: 11/25/21  ?  ?PREOPERATIVE DIAGNOSIS: Left palm mass ?  ?POSTOPERATIVE DIAGNOSIS: Left palm annular ligament cyst ?  ?PROCEDURE: Excision left palm annular ligament cyst from long finger flexor sheath ?  ?SURGEON:  Leanora Cover, M.D. ?  ?ASSISTANT: none ?  ?ANESTHESIA:  General ?  ?INTRAVENOUS FLUIDS:  Per anesthesia flow sheet. ?  ?ESTIMATED BLOOD LOSS:  Minimal. ?  ?COMPLICATIONS:  None. ?  ?SPECIMENS: Left palm cyst to pathology ?  ?TOURNIQUET TIME:   ? ?Total Tourniquet Time Documented: ?Upper Arm (Left) - 17 minutes ?Total: Upper Arm (Left) - 17 minutes ? ?  ?DISPOSITION:  Stable to PACU. ?  ?INDICATIONS: 51 year old female has noted a mass in her left palm.  It is bothersome to her.  Ultrasound shows a cyst coming from the flexor sheath.  She wishes to have this removed.  Risks, benefits and alternatives of surgery were discussed including the risks of blood loss, infection, damage to nerves, vessels, tendons, ligaments, bone for surgery, need for additional surgery, complications with wound healing, continued pain, stiffness, , recurrence.  She voiced understanding of these risks and elected to proceed. ? ?OPERATIVE COURSE:  After being identified preoperatively by myself,  the patient and I agreed on the procedure and site of the procedure.  The surgical site was marked.  Surgical consent had been signed. Preoperative IV antibiotic prophylaxis was given. She was transferred to the operating room and placed on the operating table in supine position with the Left upper extremity on an arm board.  General anesthesia was induced by the anesthesiologist.  Left upper extremity was prepped and draped in normal sterile orthopedic fashion.  A surgical pause was performed between the surgeons, anesthesia, and  operating room staff and all were in agreement as to the patient, procedure, and site of procedure.  Tourniquet at the proximal aspect of the extremity was inflated to 250 mmHg after exsanguination of the arm with an Esmarch bandage.  Incision was made over the mass in the palm of the hand just to the ulnar side of the long finger MP joint.  This was carried in subcutaneous tissues by spreading technique.  Bipolar electrocautery was used to obtain hemostasis.  Mass was easily identified.  It was filled with clear gelatinous fluid.  The digital nerve and artery at the ulnar side were adjacent to the cyst.  These were retracted and protected throughout the case.  There was a stalk coming from the ulnar side of the flexor sheath.  This was deep and adjacent to the MP joint.  The cyst was removed and sent to pathology for examination.  A window was made in the flexor sheath surrounding the area where the cyst appeared to be coming from.  The wound was copiously irrigated with sterile saline.  Was closed with 4-0 nylon in a horizontal mattress fashion.  Was injected with quarter percent plain Marcaine to aid in postoperative analgesia.  Was dressed with sterile Xeroform 4 x 4's and wrapped with a Coban dressing lightly.  The tourniquet was deflated at 17 minutes.  Fingertips were pink with brisk capillary refill after deflation of tourniquet.  The operative  drapes were broken down.  The patient was awoken from anesthesia safely.  She was transferred back to the stretcher and taken to PACU in stable  condition.  I will see her back in the office in 1 week for postoperative followup.  I will give her a prescription for Norco 5/325 1-2 tabs PO q6 hours prn pain, dispense # 15. ? ? ?Leanora Cover, MD ?Electronically signed, 11/25/21 ?

## 2021-11-25 NOTE — Anesthesia Preprocedure Evaluation (Signed)
Anesthesia Evaluation  ?Patient identified by MRN, date of birth, ID band ?Patient awake ? ? ? ?Reviewed: ?Allergy & Precautions, NPO status , Patient's Chart, lab work & pertinent test results ? ?Airway ?Mallampati: III ? ?TM Distance: >3 FB ?Neck ROM: Full ? ? ? Dental ?no notable dental hx. ? ?  ?Pulmonary ?former smoker,  ?  ?Pulmonary exam normal ? ? ? ? ? ? ? Cardiovascular ?hypertension, Pt. on home beta blockers ?Normal cardiovascular exam ? ? ?  ?Neuro/Psych ? Headaches, PSYCHIATRIC DISORDERS Anxiety  Neuromuscular disease   ? GI/Hepatic ?negative GI ROS, Neg liver ROS,   ?Endo/Other  ?Morbid obesity ? Renal/GU ?negative Renal ROS  ? ?  ?Musculoskeletal ? ?(+) Arthritis , Ambulates with walker  ? Abdominal ?(+) + obese,   ?Peds ? Hematology ?negative hematology ROS ?(+)   ?Anesthesia Other Findings ?mass cyst left middle ring palm ? Reproductive/Obstetrics ? ?  ? ? ? ? ? ? ? ? ? ? ? ? ? ?  ?  ? ? ? ? ? ? ? ? ?Anesthesia Physical ?Anesthesia Plan ? ?ASA: 3 ? ?Anesthesia Plan: General  ? ?Post-op Pain Management:   ? ?Induction: Intravenous ? ?PONV Risk Score and Plan: 3 and Ondansetron, Dexamethasone, Midazolam and Treatment may vary due to age or medical condition ? ?Airway Management Planned: LMA ? ?Additional Equipment:  ? ?Intra-op Plan:  ? ?Post-operative Plan: Extubation in OR ? ?Informed Consent: I have reviewed the patients History and Physical, chart, labs and discussed the procedure including the risks, benefits and alternatives for the proposed anesthesia with the patient or authorized representative who has indicated his/her understanding and acceptance.  ? ? ? ?Dental advisory given ? ?Plan Discussed with: CRNA ? ?Anesthesia Plan Comments:   ? ? ? ? ? ? ?Anesthesia Quick Evaluation ? ?

## 2021-11-25 NOTE — Transfer of Care (Signed)
Immediate Anesthesia Transfer of Care Note ? ?Patient: Stacy Campos ? ?Procedure(s) Performed: excision cyst left palm (Left: Hand) ? ?Patient Location: PACU ? ?Anesthesia Type:General ? ?Level of Consciousness: awake, drowsy and patient cooperative ? ?Airway & Oxygen Therapy: Patient Spontanous Breathing and Patient connected to face mask oxygen ? ?Post-op Assessment: Report given to RN and Post -op Vital signs reviewed and stable ? ?Post vital signs: Reviewed and stable ? ?Last Vitals:  ?Vitals Value Taken Time  ?BP 153/106 11/25/21 1020  ?Temp    ?Pulse 92 11/25/21 1022  ?Resp 15 11/25/21 1022  ?SpO2 99 % 11/25/21 1022  ?Vitals shown include unvalidated device data. ? ?Last Pain:  ?Vitals:  ? 11/25/21 0851  ?TempSrc: Oral  ?PainSc: 4   ?   ? ?  ? ?Complications: No notable events documented. ?

## 2021-11-25 NOTE — Discharge Instructions (Addendum)
[facility] Instructions ?Hand Surgery ? ?Wound Care: ?Keep your hand elevated above the level of your heart.  Do not allow it to dangle by your side.  Keep the dressing dry and do not remove it unless your doctor advises you to do so.  He will usually change it at the time of your post-op visit.  Moving your fingers is advised to stimulate circulation but will depend on the site of your surgery.  If you have a splint applied, your doctor will advise you regarding movement. ? ?Activity: ?Do not drive or operate machinery today.  Rest today and then you may return to your normal activity and work as indicated by your physician. ? ?Diet:  ?Drink liquids today or eat a light diet.  You may resume a regular diet tomorrow.   ? ?General expectations: ?Pain for two to three days. ?Fingers may become slightly swollen. ? ?Call your doctor if any of the following occur: ?Severe pain not relieved by pain medication. ?Elevated temperature. ?Dressing soaked with blood. ?Inability to move fingers. ?White or bluish color to fingers. ? ?*No Ibuprofen until after 5pm ? ?Post Anesthesia Home Care Instructions ? ?Activity: ?Get plenty of rest for the remainder of the day. A responsible individual must stay with you for 24 hours following the procedure.  ?For the next 24 hours, DO NOT: ?-Drive a car ?-Paediatric nurse ?-Drink alcoholic beverages ?-Take any medication unless instructed by your physician ?-Make any legal decisions or sign important papers. ? ?Meals: ?Start with liquid foods such as gelatin or soup. Progress to regular foods as tolerated. Avoid greasy, spicy, heavy foods. If nausea and/or vomiting occur, drink only clear liquids until the nausea and/or vomiting subsides. Call your physician if vomiting continues. ? ?Special Instructions/Symptoms: ?Your throat may feel dry or sore from the anesthesia or the breathing tube placed in your throat during surgery. If this causes discomfort, gargle with warm salt water. The  discomfort should disappear within 24 hours. ? ?If you had a scopolamine patch placed behind your ear for the management of post- operative nausea and/or vomiting: ? ?1. The medication in the patch is effective for 72 hours, after which it should be removed.  Wrap patch in a tissue and discard in the trash. Wash hands thoroughly with soap and water. ?2. You may remove the patch earlier than 72 hours if you experience unpleasant side effects which may include dry mouth, dizziness or visual disturbances. ?3. Avoid touching the patch. Wash your hands with soap and water after contact with the patch. ?    ?

## 2021-11-26 NOTE — Addendum Note (Signed)
Addendum  created 11/26/21 1212 by Maryella Shivers, CRNA  ? Charge Capture section accepted  ?  ?

## 2021-11-29 LAB — SURGICAL PATHOLOGY
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
## 2021-12-01 ENCOUNTER — Ambulatory Visit: Payer: Medicaid Other | Admitting: Physical Therapy

## 2021-12-01 ENCOUNTER — Encounter: Payer: Self-pay | Admitting: Physical Therapy

## 2021-12-01 DIAGNOSIS — G8929 Other chronic pain: Secondary | ICD-10-CM

## 2021-12-01 DIAGNOSIS — M79662 Pain in left lower leg: Secondary | ICD-10-CM

## 2021-12-01 DIAGNOSIS — M6281 Muscle weakness (generalized): Secondary | ICD-10-CM

## 2021-12-01 DIAGNOSIS — M542 Cervicalgia: Secondary | ICD-10-CM | POA: Diagnosis not present

## 2021-12-24 DIAGNOSIS — G894 Chronic pain syndrome: Secondary | ICD-10-CM | POA: Diagnosis not present

## 2021-12-24 DIAGNOSIS — M47816 Spondylosis without myelopathy or radiculopathy, lumbar region: Secondary | ICD-10-CM | POA: Diagnosis not present

## 2021-12-24 DIAGNOSIS — M5416 Radiculopathy, lumbar region: Secondary | ICD-10-CM | POA: Diagnosis not present

## 2021-12-24 DIAGNOSIS — M7918 Myalgia, other site: Secondary | ICD-10-CM | POA: Diagnosis not present

## 2021-12-28 ENCOUNTER — Encounter: Payer: Self-pay | Admitting: *Deleted

## 2022-02-14 DIAGNOSIS — M47816 Spondylosis without myelopathy or radiculopathy, lumbar region: Secondary | ICD-10-CM | POA: Diagnosis not present

## 2022-02-18 DIAGNOSIS — G43019 Migraine without aura, intractable, without status migrainosus: Secondary | ICD-10-CM | POA: Diagnosis not present

## 2022-03-12 DIAGNOSIS — L732 Hidradenitis suppurativa: Secondary | ICD-10-CM | POA: Diagnosis not present

## 2022-03-21 DIAGNOSIS — M47816 Spondylosis without myelopathy or radiculopathy, lumbar region: Secondary | ICD-10-CM | POA: Diagnosis not present

## 2022-04-15 ENCOUNTER — Ambulatory Visit: Payer: Medicaid Other | Admitting: Family Medicine

## 2022-04-15 ENCOUNTER — Encounter: Payer: Self-pay | Admitting: Family Medicine

## 2022-04-15 VITALS — BP 131/97 | HR 93 | Ht 67.0 in | Wt 315.8 lb

## 2022-04-15 DIAGNOSIS — M5126 Other intervertebral disc displacement, lumbar region: Secondary | ICD-10-CM | POA: Insufficient documentation

## 2022-04-15 DIAGNOSIS — L602 Onychogryphosis: Secondary | ICD-10-CM | POA: Insufficient documentation

## 2022-04-15 HISTORY — DX: Onychogryphosis: L60.2

## 2022-04-15 NOTE — Assessment & Plan Note (Signed)
Referral to podiatry for nail trimming as I suspect this resolve most of her symptoms with trying to wear shoes No wounds or skin breakdown noted today, discussed frequent foot checks that she has some numbness on her lateral foot from her lumbar disc protrusion

## 2022-04-15 NOTE — Assessment & Plan Note (Signed)
Following with [facility] known of L5-S1 on left side, described numbness and dermatomes and showed her a picture today to explain her symptoms, she was appreciative Procedure on 04/18/2022 with [facility] for ablation

## 2022-04-15 NOTE — Patient Instructions (Addendum)
It was wonderful to see you today.  Please bring ALL of your medications with you to every visit.   Today we talked about:  We referred you to podiatry to get your toenails trimmed You have your procedure in a few days for your back  Please make a follow up in the next month when youre feeling better for your tetanus booster, flu shot, and pap smear!   Thank you for choosing [facility].   Please call [phone] with any questions about today's appointment.  Please be sure to schedule follow up at the front  desk before you leave today.   Please arrive at least 15 minutes prior to your scheduled appointments.   If you had blood work today, I will send you a MyChart message or a letter if results are normal. Otherwise, I will give you a call.   If you had a referral placed, they will call you to set up an appointment. Please give [place] a call if you don't hear back in the next 2 weeks.   If you need additional refills before your next appointment, please call your pharmacy first.   Yehuda Savannah, MD  Family Medicine

## 2022-04-15 NOTE — Progress Notes (Signed)
    SUBJECTIVE:   CHIEF COMPLAINT / HPI:   Left foot pain-she notes her toenails have gotten thickened and she is unable to get a pedicure cut them herself.  When she tries to put on shoes this is pushing down and causing pain and she is unable to wear closed toed shoes.  She is open to podiatry referral.  Left leg and lateral foot numbness-she had a lumbar MRI In February 2023 in February 2023 at [facility] that shows left eccentric disc protrusion resulting in advanced left foraminal stenosis and mass effect on L5 and S1 nerve roots.  She has a procedure on September 25 to address this at [facility].  The numbness has been present for the past year.  She is wondering if it will get better.  PERTINENT  PMH / PSH: CVA, HTN, Chiari I and syrinx of spinal cord, HLD, obesity  OBJECTIVE:   BP (!) 131/97   Pulse 93   Ht '5\' 7"'$  (1.702 m)   Wt (!) 315 lb 12.8 oz (143.2 kg)   LMP 10/25/2018   SpO2 97%   BMI 49.46 kg/m   General: A&O, NAD HEENT: No sign of trauma, EOM grossly intact Respiratory: Normal work of breathing Extremities: Bilateral toenails thickened especially on the left, no skin breakdown or ingrown toenails appreciated Neuro: Normal gait, crease sensation over the L5 and S1 dermatomes of left lower extremity.  Pain with left leg raise.  Weakness of dorsiflexion of left foot noted. Psych: Appropriate mood and affect   ASSESSMENT/PLAN:   Lumbar disc herniation Following with [facility] known of L5-S1 on left side, described numbness and dermatomes and showed her a picture today to explain her symptoms, she was appreciative Procedure on 04/18/2022 with [facility] for ablation  Thickened nail Referral to podiatry for nail trimming as I suspect this resolve most of her symptoms with trying to wear shoes No wounds or skin breakdown noted today, discussed frequent foot checks that she has some numbness on her lateral foot from her lumbar disc protrusion      Lenoria Chime, MD [place]

## 2022-04-18 DIAGNOSIS — M47816 Spondylosis without myelopathy or radiculopathy, lumbar region: Secondary | ICD-10-CM | POA: Diagnosis not present

## 2022-04-24 ENCOUNTER — Emergency Department (HOSPITAL_BASED_OUTPATIENT_CLINIC_OR_DEPARTMENT_OTHER)
Admission: EM | Admit: 2022-04-24 | Discharge: 2022-04-24 | Disposition: A | Discharge status: Home / Self Care | Payer: BC Managed Care – PPO | Attending: Emergency Medicine | Admitting: Emergency Medicine

## 2022-04-24 ENCOUNTER — Emergency Department (HOSPITAL_BASED_OUTPATIENT_CLINIC_OR_DEPARTMENT_OTHER): Payer: BC Managed Care – PPO

## 2022-04-24 ENCOUNTER — Encounter (HOSPITAL_BASED_OUTPATIENT_CLINIC_OR_DEPARTMENT_OTHER): Payer: Self-pay

## 2022-04-24 ENCOUNTER — Other Ambulatory Visit: Payer: Self-pay

## 2022-04-24 DIAGNOSIS — A084 Viral intestinal infection, unspecified: Secondary | ICD-10-CM

## 2022-04-24 DIAGNOSIS — A0839 Other viral enteritis: Secondary | ICD-10-CM | POA: Insufficient documentation

## 2022-04-24 DIAGNOSIS — R109 Unspecified abdominal pain: Secondary | ICD-10-CM | POA: Diagnosis not present

## 2022-04-24 DIAGNOSIS — R1110 Vomiting, unspecified: Secondary | ICD-10-CM | POA: Diagnosis not present

## 2022-04-24 DIAGNOSIS — R000 Tachycardia, unspecified: Secondary | ICD-10-CM | POA: Diagnosis not present

## 2022-04-24 LAB — COMPREHENSIVE METABOLIC PANEL
ALT: 14 U/L (ref 0–44)
AST: 14 U/L — ABNORMAL LOW (ref 15–41)
Albumin: 4.3 g/dL (ref 3.5–5.0)
Alkaline Phosphatase: 69 U/L (ref 38–126)
Anion gap: 12 (ref 5–15)
BUN: 10 mg/dL (ref 6–20)
CO2: 26 mmol/L (ref 22–32)
Calcium: 9 mg/dL (ref 8.9–10.3)
Chloride: 97 mmol/L — ABNORMAL LOW (ref 98–111)
Creatinine, Ser: 1.04 mg/dL — ABNORMAL HIGH (ref 0.44–1.00)
GFR, Estimated: >60 mL/min (ref >60)
Glucose, Bld: 152 mg/dL — ABNORMAL HIGH (ref 70–99)
Potassium: 3.5 mmol/L (ref 3.5–5.1)
Sodium: 135 mmol/L (ref 135–145)
Total Bilirubin: 1.9 mg/dL — ABNORMAL HIGH (ref 0.3–1.2)
Total Protein: 8.1 g/dL (ref 6.5–8.1)

## 2022-04-24 LAB — CBC
HCT: 40.8 % (ref 36.0–46.0)
Hemoglobin: 13.3 g/dL (ref 12.0–15.0)
MCH: 27 pg (ref 26.0–34.0)
MCHC: 32.6 g/dL (ref 30.0–36.0)
MCV: 82.8 fL (ref 80.0–100.0)
Platelets: 271 10*3/uL (ref 150–400)
RBC: 4.93 MIL/uL (ref 3.87–5.11)
RDW: 14 % (ref 11.5–15.5)
WBC: 10.8 10*3/uL — ABNORMAL HIGH (ref 4.0–10.5)
nRBC: 0 % (ref 0.0–0.2)

## 2022-04-24 LAB — HCG, SERUM, QUALITATIVE: Preg, Serum: NEGATIVE

## 2022-04-24 LAB — TROPONIN I (HIGH SENSITIVITY): Troponin I (High Sensitivity): <2 ng/L (ref <18)

## 2022-04-24 LAB — LIPASE, BLOOD: Lipase: 27 U/L (ref 11–51)

## 2022-04-24 MED ORDER — ONDANSETRON 4 MG PO TBDP
4.0000 mg | ORAL_TABLET | Freq: Three times a day (TID) | ORAL | 0 refills | Status: DC | PRN
Start: 2022-04-24 — End: 2022-11-07

## 2022-04-24 MED ORDER — IOHEXOL 300 MG/ML  SOLN
100.0000 mL | Freq: Once | INTRAMUSCULAR | Status: AC | PRN
  Administered 2022-04-24: 100 mL via INTRAVENOUS

## 2022-04-24 MED ORDER — LACTATED RINGERS IV BOLUS
1000.0000 mL | Freq: Once | INTRAVENOUS | Status: AC
Start: 2022-04-24 — End: 2022-04-24
  Administered 2022-04-24: 1000 mL via INTRAVENOUS

## 2022-04-24 MED ORDER — ONDANSETRON HCL 4 MG/2ML IJ SOLN
4.0000 mg | Freq: Once | INTRAMUSCULAR | Status: AC
  Administered 2022-04-24: 4 mg via INTRAVENOUS
  Filled 2022-04-24: qty 2

## 2022-04-24 MED ORDER — HYDROMORPHONE HCL 1 MG/ML IJ SOLN
1.0000 mg | Freq: Once | INTRAMUSCULAR | Status: AC
  Administered 2022-04-24: 1 mg via INTRAVENOUS
  Filled 2022-04-24: qty 1

## 2022-04-24 NOTE — ED Provider Notes (Signed)
[facility] EMERGENCY DEPT Provider Note   CSN: [id number] Arrival date & time: 04/24/22  1557     History  Chief Complaint  Patient presents with   Abdominal Pain    Stacy Campos is a 51 y.o. female.  HPI 51 year old female presents with abdominal pain. Last night about 20 minutes after eating food she developed vomiting and abdominal pain. Now is just dry heaving. Abdominal pain is continuous and severe. It is diffuse. Also having chest pain. Had dyspnea but that has resolved. No fevers but has felt cold. Has had a few episodes of diarrhea, no gross blood. Feels dizzy. She has multiple comorbidities, including HTN, obesity, CVA.   Home Medications Prior to Admission medications   Medication Sig Start Date End Date Taking? Authorizing Provider  ondansetron (ZOFRAN-ODT) 4 MG disintegrating tablet Take 1 tablet (4 mg total) by mouth every 8 (eight) hours as needed for nausea or vomiting. 04/24/22  Yes Sherwood Gambler, MD  albuterol (VENTOLIN HFA) 108 (90 Base) MCG/ACT inhaler Inhale 2 puffs into the lungs every 6 (six) hours as needed for wheezing or shortness of breath. 05/08/19   Guadalupe Dawn, MD  ASPIRIN LOW DOSE 81 MG EC tablet Take 81 mg by mouth daily. 05/26/21   [provider]  cholecalciferol (VITAMIN D3) 25 MCG (1000 UNIT) tablet Take 1,000 Units by mouth daily.    [provider]  clindamycin (CLEOCIN) 300 MG capsule Take 300 mg by mouth 3 (three) times daily. 04/26/21   [provider]  gabapentin (NEURONTIN) 600 MG tablet Take 1,800 mg by mouth 3 (three) times daily.    [provider]  meloxicam (MOBIC) 7.5 MG tablet Take 7.5 mg by mouth daily. 10/20/21   [provider]  metoprolol succinate (TOPROL-XL) 25 MG 24 hr tablet Take 3 tablets at night 02/25/21   [facility], Tami Lin, PA  Multiple Vitamin (MULTIVITAMIN WITH MINERALS) TABS tablet Take 1 tablet by mouth daily.    [provider]  rifampin (RIFADIN)  300 MG capsule Take 300 mg by mouth 3 (three) times daily. 04/26/21   [provider]  rosuvastatin (CRESTOR) 5 MG tablet Take 1 tablet (5 mg total) by mouth daily. Patient taking differently: Take 10 mg by mouth daily. 08/23/21   [facility], Tami Lin, PA      Allergies    Valsartan, Onion, and Dimetapp [albertsons di bromm]    Review of Systems   Review of Systems  Constitutional:  Negative for fever.  HENT:  Positive for nosebleeds (last night, none now).   Respiratory:  Positive for shortness of breath.   Cardiovascular:  Positive for chest pain.  Gastrointestinal:  Positive for abdominal pain, diarrhea, nausea and vomiting. Negative for blood in stool.  Neurological:  Positive for light-headedness.    Physical Exam Updated Vital Signs BP (!) 103/59   Pulse 91   Temp 98.1 F (36.7 C)   Resp (!) 22   Ht '5\' 7"'$  (1.702 m)   Wt 136.1 kg   LMP 10/25/2018   SpO2 99%   BMI 46.99 kg/m  Physical Exam Vitals and nursing note reviewed.  Constitutional:      Appearance: She is well-developed. She is obese.  HENT:     Head: Normocephalic and atraumatic.  Cardiovascular:     Rate and Rhythm: Regular rhythm. Tachycardia present.     Heart sounds: Normal heart sounds.  Pulmonary:     Effort: Pulmonary effort is normal.     Breath sounds:  Normal breath sounds.  Abdominal:     Palpations: Abdomen is soft.     Tenderness: There is generalized abdominal tenderness.  Skin:    General: Skin is warm and dry.  Neurological:     Mental Status: She is alert.     ED Results / Procedures / Treatments   Labs (all labs ordered are listed, but only abnormal results are displayed) Labs Reviewed  COMPREHENSIVE METABOLIC PANEL - Abnormal; Notable for the following components:      Result Value   Chloride 97 (*)    Glucose, Bld 152 (*)    Creatinine, Ser 1.04 (*)    AST 14 (*)    Total Bilirubin 1.9 (*)    All other components within normal limits  CBC - Abnormal; Notable for  the following components:   WBC 10.8 (*)    All other components within normal limits  RESP PANEL BY RT-PCR (FLU A&B, COVID) ARPGX2  LIPASE, BLOOD  HCG, SERUM, QUALITATIVE  TROPONIN I (HIGH SENSITIVITY)    EKG EKG Interpretation  Date/Time:  Sunday April 24 2022 21:18:43 EDT Ventricular Rate:  98 PR Interval:  139 QRS Duration: 92 QT Interval:  368 QTC Calculation: 470 R Axis:   59 Text Interpretation: Sinus rhythm Consider left atrial enlargement Borderline ST depression, diffuse leads Baseline wander in lead(s) II aVR similar to July 2022 Confirmed by Sherwood Gambler [phone]) on 04/24/2022 9:26:35 PM  Radiology DG Chest Portable 1 View  Result Date: 04/24/2022 CLINICAL DATA:  Chest pain and vomiting EXAM: PORTABLE CHEST 1 VIEW COMPARISON:  02/19/2021 FINDINGS: Cardiac shadow is mildly enlarged but stable. Lungs are well aerated bilaterally. No focal infiltrate or effusion is seen. No acute bony abnormality is noted. IMPRESSION: No acute abnormality noted. Electronically Signed   By: Inez Catalina M.D.   On: 04/24/2022 21:13   CT ABDOMEN PELVIS W CONTRAST  Result Date: 04/24/2022 CLINICAL DATA:  Abdominal pain with nausea, vomiting and diarrhea. EXAM: CT ABDOMEN AND PELVIS WITH CONTRAST TECHNIQUE: Multidetector CT imaging of the abdomen and pelvis was performed using the standard protocol following bolus administration of intravenous contrast. RADIATION DOSE REDUCTION: This exam was performed according to the departmental dose-optimization program which includes automated exposure control, adjustment of the mA and/or kV according to patient size and/or use of iterative reconstruction technique. CONTRAST:  133m OMNIPAQUE IOHEXOL 300 MG/ML  SOLN COMPARISON:  May 22, 2020 FINDINGS: Lower chest: No acute abnormality. Hepatobiliary: No focal liver abnormality is seen. No gallstones, gallbladder wall thickening, or biliary dilatation. Pancreas: Unremarkable. No pancreatic ductal dilatation  or surrounding inflammatory changes. Spleen: Normal in size without focal abnormality. Adrenals/Urinary Tract: Adrenal glands are unremarkable. Kidneys are normal, without renal calculi, focal lesion, or hydronephrosis. The urinary bladder is poorly distended and subsequently limited in evaluation. Stomach/Bowel: Stomach is within normal limits. Appendix appears normal. No evidence of bowel wall thickening, distention, or inflammatory changes. Vascular/Lymphatic: No significant vascular findings are present. No enlarged abdominal or pelvic lymph nodes. Reproductive: Uterus and bilateral adnexa are unremarkable. Other: No abdominal wall hernia or abnormality. No abdominopelvic ascites. Musculoskeletal: No acute or significant osseous findings. IMPRESSION: No acute or active process within the abdomen or pelvis. Electronically Signed   By: TVirgina NorfolkM.D.   On: 04/24/2022 21:04    Procedures Procedures    Medications Ordered in ED Medications  HYDROmorphone (DILAUDID) injection 1 mg (1 mg Intravenous Given 04/24/22 2110)  ondansetron (ZOFRAN) injection 4 mg (4 mg Intravenous Given 04/24/22 2110)  lactated  ringers bolus 1,000 mL (1,000 mLs Intravenous New Bag/Given 04/24/22 2109)  iohexol (OMNIPAQUE) 300 MG/ML solution 100 mL (100 mLs Intravenous Contrast Given 04/24/22 2051)    ED Course/ Medical Decision Making/ A&P                           Medical Decision Making Amount and/or Complexity of Data Reviewed Labs: ordered.    Details: Mild leukocytosis, probably reactive to vomiting.  Slight bump in creatinine but not consistent with AKI.  Normal lipase and troponin. Radiology: ordered and independent interpretation performed.    Details: CT without obstruction.  Chest x-ray without pneumonia. ECG/medicine tests: independent interpretation performed.    Details: Nonspecific findings unchanged from 2022.  Risk Prescription drug management.   Patient likely has viral gastroenteritis.  She  was given IV Dilaudid and a CT was obtained given her significant abdominal tenderness on exam but with an unremarkable work-up and significant improvement, my suspicion for missed intra-abdominal emergency is fairly low.  She is feeling a lot better.  No vomiting while in the emergency department.  She was given fluids.  She appears stable for discharge home with supportive care and Zofran prescription.  We will give return precautions.        Final Clinical Impression(s) / ED Diagnoses Final diagnoses:  Viral gastroenteritis    Rx / DC Orders ED Discharge Orders          Ordered    ondansetron (ZOFRAN-ODT) 4 MG disintegrating tablet  Every 8 hours PRN        04/24/22 2226              Sherwood Gambler, MD 04/24/22 2306

## 2022-04-24 NOTE — ED Notes (Signed)
RT placed pt on [place] 2 Lpm for desaturation post pain medication administration. Pt sats increased post placement from 88% to 95% on  2 Lpm. RT will continue to monitor while at [facility] ED.

## 2022-04-24 NOTE — Discharge Instructions (Signed)
If you develop worsening, continued, or recurrent abdominal pain, uncontrolled vomiting, fever, chest or back pain, or any other new/concerning symptoms then return to the ER for evaluation.  

## 2022-04-24 NOTE — ED Triage Notes (Signed)
Pt arrives POV with her daughter with c/o abdominal pain, chest pain, nausea, vomiting and diarrhea.  Pt's daughter says pt ate sushi, along with some other things last night, and then vomited several times.    Pt's daughter says she has had more episodes of vomiting today, and has started having episodes of diarrhea.  Pt says she has tried [employer] before arriving.  Pt BIB wheelchair, appears very lethargic in triage but is not in acute distress.

## 2022-04-27 ENCOUNTER — Telehealth: Payer: Self-pay | Admitting: *Deleted

## 2022-04-27 NOTE — Patient Outreach (Signed)
Care Coordination  04/27/2022  Stacy Campos Jan 18, 1971 [id number]   Transition Care Management Follow-up Telephone Call Date of discharge and from where: 04/24/22 from [facility] How have you been since you were released from the hospital? Patient reports feeling better since discharge. Any questions or concerns? No  Items Reviewed: Did the pt receive and understand the discharge instructions provided? Yes  Medications obtained and verified? Yes  Other? No  Any new allergies since your discharge? No  Dietary orders reviewed? Yes Do you have support at home? Yes   Home Care and Equipment/Supplies: Were home health services ordered? no If so, what is the name of the agency? N/A  Has the agency set up a time to come to the patient's home? not applicable Were any new equipment or medical supplies ordered?  No What is the name of the medical supply agency? N/A Were you able to get the supplies/equipment? not applicable Do you have any questions related to the use of the equipment or supplies? No  Functional Questionnaire: (I = Independent and D = Dependent) ADLs: I  Bathing/Dressing- I  Meal Prep- I  Eating- I  Maintaining continence- I  Transferring/Ambulation- I  Managing Meds- I  Follow up appointments reviewed:  PCP Hospital f/u appt confirmed?  N/A, ED visit   [facility] f/u appt confirmed?  N/A, ED visit   Are transportation arrangements needed? No  If their condition worsens, is the pt aware to call PCP or go to the Emergency Dept.? Yes Was the patient provided with contact information for the PCP's office or ED? No, patient has contact information Was to pt encouraged to call back with questions or concerns? Yes  Lurena Joiner RN, BSN [name]  [employer] manager

## 2022-05-02 DIAGNOSIS — M47816 Spondylosis without myelopathy or radiculopathy, lumbar region: Secondary | ICD-10-CM | POA: Diagnosis not present

## 2022-05-05 ENCOUNTER — Ambulatory Visit: Payer: BC Managed Care – PPO | Admitting: Podiatry

## 2022-05-05 DIAGNOSIS — B351 Tinea unguium: Secondary | ICD-10-CM

## 2022-05-05 DIAGNOSIS — M79675 Pain in left toe(s): Secondary | ICD-10-CM | POA: Diagnosis not present

## 2022-05-05 DIAGNOSIS — M79674 Pain in right toe(s): Secondary | ICD-10-CM | POA: Diagnosis not present

## 2022-05-05 MED ORDER — TERBINAFINE HCL 250 MG PO TABS: 250.0000 mg | ORAL_TABLET | Freq: Every day | ORAL | 2 refills | Status: AC

## 2022-05-05 MED ORDER — CICLOPIROX 8 % EX SOLN: Freq: Every day | CUTANEOUS | 0 refills | Status: DC

## 2022-05-05 NOTE — Progress Notes (Signed)
  Subjective:  Patient ID: Stacy Campos, female    DOB: 03/23/1971,  MRN: [id number]  Chief Complaint  Patient presents with   Nail Problem    Possible fungus on toe of left foot     Numbness    Room 16  Pt states she had numbness in left foot for over 1 year     51 y.o. female presents with the above complaint. History confirmed with patient. Patient presenting with pain related to dystrophic thickened elongated nails, esp the left hallux nail. Patient is unable to trim own nails related to nail dystrophy and/or mobility issues. Patient does not have a history of T2DM but does have issue with sciatica esp on the left foot causing numbness ot the 4th and 5th toe.   Objective:  Physical Exam: warm, good capillary refill, DP and PT pulses 2/4 bilateral nail exam onychomycosis of the toenails, onycholysis, and dystrophic nails DP pulses palpable, PT pulses palpable, and protective sensation intact except for left foot 4th and 5th toe which are absent sentation to light touch related to sciatica Left Foot:  Pain with palpation of nails due to elongation and dystrophic growth.  Right Foot: Pain with palpation of nails due to elongation and dystrophic growth.   Assessment:   1. Onychomycosis   2. Pain due to onychomycosis of toenails of both feet      Plan:  Patient was evaluated and treated and all questions answered.  #Onychomycosis -Educated on etiology of nail fungus. -Nail sample taken for microbiology and histology. -Baseline liver function studies ordered. Will d/c terbinafine if elevated during therapy. -eRx for oral terbinafine 250 mg daily x 3 mo  Educated on risks and benefits of the medication including risk of liver toxicity . -eRx for penlac 8% solution apply topically to all affected nails daily    #Onychomycosis with pain  -Nails palliatively debrided as below. -Educated on self-care  Procedure: Nail Debridement Rationale: Pain Type of Debridement: manual, sharp  debridement. Instrumentation: Nail nipper, rotary burr. Number of Nails: 10  Return in about 3 months (around 08/05/2022) for Onychomycosis.         Everitt Amber, DPM [facility]
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
## 2022-05-18 ENCOUNTER — Ambulatory Visit: Payer: BC Managed Care – PPO | Attending: Physician Assistant | Admitting: Physician Assistant

## 2022-05-20 ENCOUNTER — Encounter: Payer: Self-pay | Admitting: Family Medicine

## 2022-05-20 ENCOUNTER — Other Ambulatory Visit: Payer: Self-pay

## 2022-05-20 ENCOUNTER — Ambulatory Visit: Payer: BC Managed Care – PPO | Admitting: Family Medicine

## 2022-05-20 VITALS — BP 148/91 | HR 100 | Wt 314.6 lb

## 2022-05-20 DIAGNOSIS — H5711 Ocular pain, right eye: Secondary | ICD-10-CM

## 2022-05-20 NOTE — Progress Notes (Unsigned)
    SUBJECTIVE:   CHIEF COMPLAINT / HPI:   Over the last few weeks right eye burning. This morning woke up and itw as closed and swollen. Tried using warm compress. Today eye was watering. Believes it started when she was using double screens in September. Worsened with light. Feels a little itchy at the top of the eye. Right eye feels a little more blurry. Was blood shot red this morning. Denies headaches or new extremity weakness   PERTINENT  PMH / PSH: ***  OBJECTIVE:   BP (!) 148/91   Pulse 100   Wt (!) 314 lb 9.6 oz (142.7 kg)   LMP 10/25/2018   SpO2 98%   BMI 49.27 kg/m    Physical exam General: well appearing, NAD HEENT: *** Cardiovascular: RRR, no murmurs Lungs: CTAB. Normal WOB Abdomen: soft, non-distended, non-tender Skin: warm, dry. No edema  ASSESSMENT/PLAN:   No problem-specific Assessment & Plan notes found for this encounter.   Concern for uveitis vs glaucoma given eye pain and photophobia. Less likely to be foreign body given symptoms for the past 3 weeks or corneal abrasion given no history of trauma or contact use, etc   Stacy Campos, [place]
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
## 2022-05-20 NOTE — Patient Instructions (Signed)
It was great seeing you today!  Im sorry you have been experiencing this eye pain. I have placed a referral to ophthalmology and they will call you within the next week to schedule that appointment.  Feel free to call with any questions or concerns at any time, at [phone].   Take care,  Dr. Shary Key [facility]

## 2022-05-21 DIAGNOSIS — H5711 Ocular pain, right eye: Secondary | ICD-10-CM

## 2022-05-21 HISTORY — DX: Ocular pain, right eye: H57.11

## 2022-05-21 NOTE — Assessment & Plan Note (Signed)
Patient presents with 3 weeks of worsening R eye pain, redness, blurriness, and sensitivity to light without inciting trauma. Physical exam with increased sensitivity to light, normal pupillary reflex, extraocular movement, and conjunctiva clear. Vision in L eye 20/20, R 20/40 and both 20/20. Concern for uveitis vs glaucoma given eye pain and photophobia. Less likely to be foreign body given her symptoms occurring for the past 3 weeks or corneal abrasion given no history of trauma or contact use, etc. Placed referral to ophthalmology and will reach out to the office on Monday. Strict ED precautions provided.

## 2022-05-25 ENCOUNTER — Encounter: Payer: Self-pay | Admitting: Physician Assistant

## 2022-05-25 ENCOUNTER — Ambulatory Visit: Payer: BC Managed Care – PPO | Attending: Physician Assistant | Admitting: Physician Assistant

## 2022-05-25 VITALS — BP 138/88 | HR 96 | Ht 67.0 in | Wt 315.8 lb

## 2022-05-25 DIAGNOSIS — R002 Palpitations: Secondary | ICD-10-CM

## 2022-05-25 DIAGNOSIS — E785 Hyperlipidemia, unspecified: Secondary | ICD-10-CM

## 2022-05-25 DIAGNOSIS — I639 Cerebral infarction, unspecified: Secondary | ICD-10-CM

## 2022-05-25 DIAGNOSIS — I10 Essential (primary) hypertension: Secondary | ICD-10-CM

## 2022-05-25 MED ORDER — METOPROLOL SUCCINATE ER 25 MG PO TB24: 75.0000 mg | ORAL_TABLET | Freq: Every day | ORAL | 3 refills | Status: DC

## 2022-05-25 MED ORDER — ROSUVASTATIN CALCIUM 10 MG PO TABS: 10.0000 mg | ORAL_TABLET | Freq: Every day | ORAL | 3 refills | Status: DC

## 2022-05-25 NOTE — Patient Instructions (Addendum)
Medication Instructions:  INCREASE Crestor to 10 mg daily  *If you need a refill on your cardiac medications before your next appointment, please call your pharmacy*  Lab Work: Your physician recommends that you return for lab work in 6 weeks (07/06/22):  Fasting Lipid Panel-DO NOT eat or drink past midnight. Okay to have water to drink morning of  Hepatic (Liver) Function Test  If you have labs (blood work) drawn today and your tests are completely normal, you will receive your results only by: MyChart Message (if you have MyChart) OR A paper copy in the mail If you have any lab test that is abnormal or we need to change your treatment, we will call you to review the results.  Testing/Procedures: NONE ordered at this time of appointment   Follow-Up: At [facility], you and your health needs are our priority.  As part of our continuing mission to provide you with exceptional heart care, we have created designated Provider Care Teams.  These Care Teams include your primary Cardiologist (physician) and Advanced Practice Providers (APPs -  Physician Assistants and Nurse Practitioners) who all work together to provide you with the care you need, when you need it.  Your next appointment:   6 month(s)  The format for your next appointment:   In Person  Provider:   Quay Burow, MD  or Fabian Sharp, PA-C        Other Instructions  Important Information About Sugar

## 2022-06-01 ENCOUNTER — Encounter: Payer: Self-pay | Admitting: Podiatry

## 2022-06-07 DIAGNOSIS — M47816 Spondylosis without myelopathy or radiculopathy, lumbar region: Secondary | ICD-10-CM | POA: Diagnosis not present

## 2022-06-07 DIAGNOSIS — M7918 Myalgia, other site: Secondary | ICD-10-CM | POA: Diagnosis not present

## 2022-06-07 DIAGNOSIS — G894 Chronic pain syndrome: Secondary | ICD-10-CM | POA: Diagnosis not present

## 2022-06-07 DIAGNOSIS — M5416 Radiculopathy, lumbar region: Secondary | ICD-10-CM | POA: Diagnosis not present

## 2022-07-07 LAB — HEPATIC FUNCTION PANEL
ALT: 18 IU/L (ref 0–32)
AST: 16 IU/L (ref 0–40)
Albumin: 4.2 g/dL (ref 3.8–4.9)
Alkaline Phosphatase: 89 IU/L (ref 44–121)
Bilirubin Total: <0.2 mg/dL (ref 0.0–1.2)
Bilirubin, Direct: <0.1 mg/dL (ref 0.00–0.40)
Total Protein: 7.5 g/dL (ref 6.0–8.5)

## 2022-07-07 LAB — LIPID PANEL
Chol/HDL Ratio: 3.7 ratio (ref 0.0–4.4)
Cholesterol, Total: 146 mg/dL (ref 100–199)
HDL: 40 mg/dL (ref >39)
LDL Chol Calc (NIH): 92 mg/dL (ref 0–99)
Triglycerides: 69 mg/dL (ref 0–149)
VLDL Cholesterol Cal: 14 mg/dL (ref 5–40)

## 2022-07-15 ENCOUNTER — Other Ambulatory Visit: Payer: Self-pay | Admitting: *Deleted

## 2022-07-15 MED ORDER — ROSUVASTATIN CALCIUM 20 MG PO TABS: 20.0000 mg | ORAL_TABLET | Freq: Every day | ORAL | 3 refills | Status: DC

## 2022-08-18 ENCOUNTER — Ambulatory Visit (INDEPENDENT_AMBULATORY_CARE_PROVIDER_SITE_OTHER): Payer: BLUE CROSS/BLUE SHIELD | Admitting: Podiatry

## 2022-08-18 DIAGNOSIS — B351 Tinea unguium: Secondary | ICD-10-CM | POA: Diagnosis not present

## 2022-08-18 DIAGNOSIS — B353 Tinea pedis: Secondary | ICD-10-CM

## 2022-08-18 MED ORDER — KETOCONAZOLE 2 % EX CREA: 1.0000 | TOPICAL_CREAM | Freq: Every day | CUTANEOUS | 0 refills | Status: DC

## 2022-08-18 NOTE — Progress Notes (Signed)
  Subjective:  Patient ID: Stacy Campos, female    DOB: Jul 27, 1970,  MRN: [id number]  Chief Complaint  Patient presents with   Nail Problem    Nail follow up. Patient states that her 4th and 5th toe  on her left foot are numb at times as well as her left heel.    52 y.o. female presents with the above complaint. History confirmed with patient. Patient presenting for follow-up related to dystrophic thickened elongated nails, esp the left hallux nail. Patient is unable to trim own nails related to nail dystrophy and/or mobility issues. Patient does not have a history of T2DM but does have issue with sciatica esp on the left foot causing numbness ot the 4th and 5th toe.  Patient reports that her left hallux nail fell off and she has an acrylic nail over it at this time.  There is no pain at this time.  She also reports numbness in the fourth and fifth toe that has not improved since prior.  She does have a history of sciatica and low back problems.  Also has noticed dry flaking skin on both feet on the bottom not using any antifungal creams or sprays on the feet.  Objective:  Physical Exam: warm, good capillary refill, DP and PT pulses 2/4 bilateral nail exam onychomycosis of the toenails, onycholysis, and dystrophic nails.  There is dry flaking xerotic skin present in the bilateral plantar aspect of both feet DP pulses palpable, PT pulses palpable, and protective sensation intact except for left foot 4th and 5th toe which are absent sentation to light touch related to sciatica Left Foot:  Pain with palpation of nails due to elongation and dystrophic growth.  Right Foot: Pain with palpation of nails due to elongation and dystrophic growth.   Assessment:   1. Onychomycosis   2. Tinea pedis of both feet       Plan:  Patient was evaluated and treated and all questions answered.  # Tinea pedis bilateral plantar foot Discussed the etiology and treatment options for tinea pedis.  Discussed  topical and oral treatment.  Recommended topical treatment with 2% ketoconazole cream.  This was sent to the patient's pharmacy.  Also discussed appropriate foot hygiene, use of antifungal spray such as Tinactin in shoes, as well as cleaning her foot surfaces such as showers and bathroom floors with bleach.  #Onychomycosis -Educated on etiology of nail fungus. -Improved following terbinafine therapy for 3 months as well as topical antifungal -Continue to monitor  Return in about 3 months (around 11/17/2022) for Follow-up tinea pedis and onychomycosis.         Everitt Amber, DPM [facility]

## 2022-09-26 DIAGNOSIS — E559 Vitamin D deficiency, unspecified: Secondary | ICD-10-CM | POA: Diagnosis not present

## 2022-09-26 DIAGNOSIS — R7989 Other specified abnormal findings of blood chemistry: Secondary | ICD-10-CM | POA: Diagnosis not present

## 2022-11-07 ENCOUNTER — Ambulatory Visit (HOSPITAL_COMMUNITY)
Admission: EM | Admit: 2022-11-07 | Discharge: 2022-11-07 | Disposition: A | Discharge status: Home / Self Care | Payer: 59 | Attending: Physician Assistant | Admitting: Physician Assistant

## 2022-11-07 ENCOUNTER — Encounter (HOSPITAL_COMMUNITY): Payer: Self-pay

## 2022-11-07 DIAGNOSIS — R197 Diarrhea, unspecified: Secondary | ICD-10-CM | POA: Diagnosis not present

## 2022-11-07 DIAGNOSIS — R110 Nausea: Secondary | ICD-10-CM

## 2022-11-07 DIAGNOSIS — L0231 Cutaneous abscess of buttock: Secondary | ICD-10-CM

## 2022-11-07 DIAGNOSIS — R112 Nausea with vomiting, unspecified: Secondary | ICD-10-CM | POA: Diagnosis not present

## 2022-11-07 MED ORDER — DOXYCYCLINE HYCLATE 100 MG PO CAPS: 100.0000 mg | ORAL_CAPSULE | Freq: Two times a day (BID) | ORAL | 0 refills | Status: DC

## 2022-11-07 MED ORDER — CLINDAMYCIN PHOSPHATE 1 % EX LOTN: TOPICAL_LOTION | Freq: Two times a day (BID) | CUTANEOUS | 0 refills | Status: DC

## 2022-11-07 MED ORDER — HYDROCODONE-ACETAMINOPHEN 5-325 MG PO TABS: 1.0000 | ORAL_TABLET | Freq: Three times a day (TID) | ORAL | 0 refills | Status: DC

## 2022-11-07 MED ORDER — ONDANSETRON 4 MG PO TBDP: 4.0000 mg | ORAL_TABLET | Freq: Three times a day (TID) | ORAL | 0 refills | Status: DC | PRN

## 2022-11-07 NOTE — Discharge Instructions (Addendum)
Advised take the Zofran 4 mg every 6 hours on a regular basis to help control nausea and to help slow the diarrhea. Advised to use Epsom salt warm water soaks, 15 minutes, 3-4 times throughout the day if possible.  Advised follow-up PCP return to urgent care as needed.

## 2022-11-07 NOTE — ED Triage Notes (Signed)
Pt has a boil in her rectal area x 2 days. Reports seeing blood when she wiped this morning.
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
## 2022-11-08 ENCOUNTER — Telehealth (HOSPITAL_COMMUNITY): Payer: Self-pay | Admitting: Family Medicine

## 2022-11-08 MED ORDER — MUPIROCIN 2 % EX OINT: 1.0000 | TOPICAL_OINTMENT | Freq: Two times a day (BID) | CUTANEOUS | 0 refills | Status: DC

## 2022-11-08 NOTE — Telephone Encounter (Signed)
Clinda not covered without PA. Pt taking doxy.... bactroban sent as alternative to the clinda

## 2022-11-17 ENCOUNTER — Ambulatory Visit: Payer: BLUE CROSS/BLUE SHIELD | Admitting: Podiatry

## 2022-11-23 ENCOUNTER — Ambulatory Visit: Payer: 59 | Attending: Cardiovascular Disease | Admitting: Cardiovascular Disease

## 2022-11-23 ENCOUNTER — Encounter: Payer: Self-pay | Admitting: Cardiovascular Disease

## 2022-11-23 VITALS — BP 134/86 | HR 82 | Ht 67.0 in | Wt 321.0 lb

## 2022-11-23 DIAGNOSIS — E782 Mixed hyperlipidemia: Secondary | ICD-10-CM | POA: Diagnosis not present

## 2022-11-23 DIAGNOSIS — I10 Essential (primary) hypertension: Secondary | ICD-10-CM | POA: Diagnosis not present

## 2022-11-23 NOTE — Assessment & Plan Note (Signed)
History of morbid obesity with a BMI of 50.  She has gained 30 pounds since I saw her 6 years ago.  She is willing to be seen at [facility] wellness for physician assisted weight loss.

## 2022-11-23 NOTE — Patient Instructions (Addendum)
Medication Instructions:  Your physician recommends that you continue on your current medications as directed. Please refer to the Current Medication list given to you today.  *If you need a refill on your cardiac medications before your next appointment, please call your pharmacy*   Lab Work: None   Testing/Procedures: None   Follow-Up: At [facility], you and your health needs are our priority.  As part of our continuing mission to provide you with exceptional heart care, we have created designated Provider Care Teams.  These Care Teams include your primary Cardiologist (physician) and Advanced Practice Providers (APPs -  Physician Assistants and Nurse Practitioners) who all work together to provide you with the care you need, when you need it.  Your next appointment:   6 month(s)  Provider:   Micah Flesher, PA-C    Then, Nanetta Batty, MD will plan to see you again in 12 month(s).

## 2022-11-23 NOTE — Assessment & Plan Note (Signed)
History of essential hypertension with blood pressure measured today of 134/86.  She is on Toprol-XL 3 times daily.

## 2022-11-23 NOTE — Progress Notes (Signed)
11/23/2022 Stacy Campos   1970/08/21  [id number]  Primary Physician Dameron, Nolberto Hanlon, DO Primary Cardiologist: Runell Gess MD Nicholes Calamity, [place]  HPI:  Stacy Campos is a 52 y.o.  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 She does have history of hypertension on lisinopril and chlorthalidone.  Apparently her sister who was  in remission from cancer and passed away in 17-Nov-2017.  She was complaining of atypical chest pain which is sharp and piercing lasting 10 minutes at a time without radiation or other symptoms. Since I saw her last she continues to have infrequent atypical chest pain. Her blood pressure is elevated at 158/98 today. She is aware of salt restriction. She stopped taking her lisinopril on her own because of changes in her vision which she attributes to this.  Since I saw her 6 years ago she has been seen Angie Duke PA-C.  Her chest pain resolved.  She was having palpitations which she attributed to anxiety.  She has gained 30 pounds as I saw her.  She has had some back issues with sciatica requiring injections.   Current Meds  Medication Sig   ASPIRIN LOW DOSE 81 MG EC tablet Take 81 mg by mouth daily.   cholecalciferol (VITAMIN D3) 25 MCG (1000 UNIT) tablet Take 1,000 Units by mouth daily.   clindamycin (CLEOCIN) 300 MG capsule Take 300 mg by mouth 3 (three) times daily.   gabapentin (NEURONTIN) 600 MG tablet Take 1,800 mg by mouth 3 (three) times daily.   HYDROcodone-acetaminophen (NORCO/VICODIN) 5-325 MG tablet Take 1-2 tablets by mouth 3 (three) times daily.   metoprolol succinate (TOPROL-XL) 25 MG 24 hr tablet Take 3 tablets (75 mg total) by mouth daily.   Multiple Vitamin (MULTIVITAMIN WITH MINERALS) TABS tablet Take 1 tablet by mouth  daily.   ondansetron (ZOFRAN-ODT) 4 MG disintegrating tablet Take 1 tablet (4 mg total) by mouth every 8 (eight) hours as needed for nausea or vomiting.   rifampin (RIFADIN) 300 MG capsule Take 300 mg by mouth 3 (three) times daily.   rosuvastatin (CRESTOR) 20 MG tablet Take 1 tablet (20 mg total) by mouth daily.   [DISCONTINUED] clindamycin (CLEOCIN T) 1 % lotion Apply topically 2 (two) times daily.   [DISCONTINUED] doxycycline (VIBRAMYCIN) 100 MG capsule Take 1 capsule (100 mg total) by mouth 2 (two) times daily.   [DISCONTINUED] ketoconazole (NIZORAL) 2 % cream Apply 1 Application topically daily.   [DISCONTINUED] mupirocin ointment (BACTROBAN) 2 % Apply 1 Application topically 2 (two) times daily. 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    Employer: [employer]  Tobacco Use   Smoking status: Former    Years: 2    Types: Cigarettes    Quit date: 1990    Years since quitting: 34.3   Smokeless tobacco:  Never  Vaping Use   Vaping Use: Never used  Substance and Sexual Activity   Alcohol use: Yes    Alcohol/week: 0.0 standard drinks of alcohol    Comment: rare   Drug use: No   Sexual activity: Not Currently  Other Topics Concern   Not on file  Social History Narrative   Not on file   Social Determinants of Health   Financial Resource Strain: Not on file  Food Insecurity: Not on file  Transportation Needs: Not on file  Physical Activity: Not on file  Stress: Not on file  Social Connections: Not on file  Intimate Partner Violence: Not on file     Review of Systems: General: negative  for chills, fever, night sweats or weight changes.  Cardiovascular: negative for chest pain, dyspnea on exertion, edema, orthopnea, palpitations, paroxysmal nocturnal dyspnea or shortness of breath Dermatological: negative for rash Respiratory: negative for cough or wheezing Urologic: negative for hematuria Abdominal: negative for nausea, vomiting, diarrhea, bright red blood per rectum, melena, or hematemesis Neurologic: negative for visual changes, syncope, or dizziness All other systems reviewed and are otherwise negative except as noted above.    Blood pressure 134/86, pulse 82, height 5\' 7"  (1.702 m), weight (!) 321 lb (145.6 kg), last menstrual period 10/25/2018.  General appearance: alert and no distress Neck: no adenopathy, no carotid bruit, no JVD, supple, symmetrical, trachea midline, and thyroid not enlarged, symmetric, no tenderness/mass/nodules Lungs: clear to auscultation bilaterally Heart: regular rate and rhythm, S1, S2 normal, no murmur, click, rub or gallop Extremities: extremities normal, atraumatic, no cyanosis or edema Pulses: 2+ and symmetric Skin: Skin color, texture, turgor normal. No rashes or lesions Neurologic: Grossly normal  EKG rhythm at 82 without ST or T wave changes.  Personally reviewed this EKG.  ASSESSMENT AND PLAN:   Essential hypertension History of essential hypertension with blood pressure measured today of 134/86.  She is on Toprol-XL 3 times daily.  Morbid obesity (HCC) History of morbid obesity with a BMI of 50.  She has gained 30 pounds since I saw her 6 years ago.  She is willing to be seen at [facility] wellness for physician assisted weight loss.  Hyperlipidemia History of hyperlipidemia on statin therapy with lipid profile performed 07/06/2022 revealing total cholesterol 146, LDL 92 and HDL 40.     Runell Gess MD FACP,FACC,FAHA, [facility] 11/23/2022 9:22 AM

## 2022-11-23 NOTE — Assessment & Plan Note (Signed)
History of hyperlipidemia on statin therapy with lipid profile performed 07/06/2022 revealing total cholesterol 146, LDL 92 and HDL 40.

## 2023-03-08 ENCOUNTER — Encounter (INDEPENDENT_AMBULATORY_CARE_PROVIDER_SITE_OTHER): Payer: 59 | Admitting: Family Medicine

## 2023-04-27 ENCOUNTER — Ambulatory Visit
Admission: RE | Admit: 2023-04-27 | Discharge: 2023-04-27 | Disposition: A | Discharge status: Home / Self Care | Payer: 59 | Source: Ambulatory Visit | Attending: Family Medicine | Admitting: Family Medicine

## 2023-04-27 ENCOUNTER — Ambulatory Visit (INDEPENDENT_AMBULATORY_CARE_PROVIDER_SITE_OTHER): Payer: 59 | Admitting: Student

## 2023-04-27 ENCOUNTER — Ambulatory Visit: Payer: 59

## 2023-04-27 VITALS — BP 150/80 | HR 94 | Wt 324.4 lb

## 2023-04-27 DIAGNOSIS — G8929 Other chronic pain: Secondary | ICD-10-CM | POA: Diagnosis not present

## 2023-04-27 DIAGNOSIS — M25561 Pain in right knee: Secondary | ICD-10-CM | POA: Diagnosis not present

## 2023-04-27 DIAGNOSIS — M25551 Pain in right hip: Secondary | ICD-10-CM

## 2023-04-27 DIAGNOSIS — H547 Unspecified visual loss: Secondary | ICD-10-CM | POA: Diagnosis not present

## 2023-04-27 NOTE — Progress Notes (Signed)
  SUBJECTIVE:   CHIEF COMPLAINT / HPI:   Vision and Hip Pain  Vision Looking for eye doctor, vision is poor, used to have an eye doctor but he retired and she doesn't like who they sent her to afterwards.  Hip/Leg Pain Had some lumbar work done last year, they put in nerve blockers , because she couldn't walk without a walker. Had a fall in May, was seen by her orthopedist and everything was okay, But come August, started having hip and knee pain. Hip pain is when turning onto her hip, knee pain happens with walking. Also notes her knee cap moves. Both pains are worse in the morning, but tend to improve towards tolerable come the afternoon/evening. Pain is severe for her and hard for her to manage.    PERTINENT  PMH / PSH:    OBJECTIVE:  BP (!) 150/80   Pulse 94   Wt (!) 324 lb 6.4 oz (147.1 kg)   LMP 10/25/2018   SpO2 96%   BMI 50.81 kg/m  Physical Exam Constitutional:      General: She is in acute distress.  Musculoskeletal:     Right hip: Tenderness and bony tenderness present. No deformity, lacerations or crepitus. Decreased range of motion.     Left hip: Normal.     Right knee: Bony tenderness present. No crepitus. Normal range of motion. Tenderness present over the medial joint line and lateral joint line. No LCL laxity or MCL laxity.     Instability Tests: Anterior drawer test negative. Posterior drawer test negative.  Neurological:     Mental Status: She is alert.      ASSESSMENT/PLAN:  Pain of right hip Assessment & Plan: Patient repots hx of knee pain but acutely worse since August. Patient had fall a few months earlier that she originally felt fine from, but now is wondering if this playing a role. Pain tends to be better towards the later half of day. Som concern for OA given age and symptoms hx, also concern for fx or osteo necrosis of hip, given post-menopausal. Patient exam TTP in hip, unable to fully range joint. Low concern for osteonecrosis/fx, but will  obtain imaging. For now will recommend topical pain management.  -DG Right Hip -Voltaren gel and lidocaine patches   Orders: -     DG HIP UNILAT W OR W/O PELVIS 2-3 VIEWS RIGHT; Future -     Ambulatory referral to Physical Therapy  Chronic pain of right knee Assessment & Plan: Patient repots hx of knee pain but acutely worse since August, following fall. Patient had exam following fall and was okay, but is wondering if it's playing a role now. Patient with severe TTP diffusely about joint line on knee, but good ROM, negative signs for menisci tear. Som concern for OA given age and symptoms hx, also concern for fx given fall and post-menopausal. Will recommend topical analgesic -DG Right Knee -Voltaren gel and lidocaine patches   Orders: -     DG Knee Complete 4 Views Right; Future -     Ambulatory referral to Physical Therapy  Poor vision Assessment & Plan: Patient comes in looking for referral to opthamalogist. Report's hx of poor vision and retirement of previous provider. Vision tested today 20/30, 20/40, 20/40 Both. -Amb Ref to ophthal  Orders: -     Ambulatory referral to Ophthalmology   No follow-ups on file. Bess Kinds, MD 04/29/2023, 7:22 PM PGY-3, [facility]

## 2023-04-27 NOTE — Patient Instructions (Addendum)
It was great to see you! Thank you for allowing me to participate in your care!  I recommend that you always bring your medications to each appointment as this makes it easy to ensure we are on the correct medications and helps [place] not miss when refills are needed.  Our plans for today:  - Vision  Placing a referral for ophthalmologist *If you've not heard anything about the referral in 2 weeks, call the clinic and inquire about the referral.  - Back/Hip pain Your pain sounds like it may be coming from arthritis, but we will get some imaging to be sure. I also will recommend you go to Physical Therapy and use your walker at all times.   Referral to Physical Therapy.    *If you've not heard back about this referral in 2 weeks, call the clinic and inquire about the referral  Xray Hip and Knee [facility] [address] [place] Phone: [phone]   *Go when convenient for you.  Pain control: Use Salonpas and Voltaren gel as needed.  Can leave the salonpas on for 12 hours at a time, then can use voltaren gel when not using the patch.         Take care and seek immediate care sooner if you develop any concerns.   Dr. Bess Kinds, MD [facility]

## 2023-04-29 DIAGNOSIS — M25551 Pain in right hip: Secondary | ICD-10-CM | POA: Insufficient documentation

## 2023-04-29 DIAGNOSIS — G8929 Other chronic pain: Secondary | ICD-10-CM | POA: Insufficient documentation

## 2023-04-29 DIAGNOSIS — H547 Unspecified visual loss: Secondary | ICD-10-CM | POA: Insufficient documentation

## 2023-04-29 HISTORY — DX: Pain in right hip: M25.551

## 2023-04-29 NOTE — Assessment & Plan Note (Signed)
Patient comes in looking for referral to opthamalogist. Report's hx of poor vision and retirement of previous provider. Vision tested today 20/30, 20/40, 20/40 Both. -Amb Ref to ophthal

## 2023-04-29 NOTE — Assessment & Plan Note (Addendum)
Patient repots hx of knee pain but acutely worse since August, following fall. Patient had exam following fall and was okay, but is wondering if it's playing a role now. Patient with severe TTP diffusely about joint line on knee, but good ROM, negative signs for menisci tear. Som concern for OA given age and symptoms hx, also concern for fx given fall and post-menopausal. Will recommend topical analgesic -DG Right Knee -Voltaren gel and lidocaine patches

## 2023-04-29 NOTE — Assessment & Plan Note (Signed)
Patient repots hx of knee pain but acutely worse since August. Patient had fall a few months earlier that she originally felt fine from, but now is wondering if this playing a role. Pain tends to be better towards the later half of day. Som concern for OA given age and symptoms hx, also concern for fx or osteo necrosis of hip, given post-menopausal. Patient exam TTP in hip, unable to fully range joint. Low concern for osteonecrosis/fx, but will obtain imaging. For now will recommend topical pain management.  -DG Right Hip -Voltaren gel and lidocaine patches

## 2023-05-05 ENCOUNTER — Telehealth: Payer: Self-pay | Admitting: Student

## 2023-05-05 DIAGNOSIS — M25551 Pain in right hip: Secondary | ICD-10-CM

## 2023-05-05 NOTE — Telephone Encounter (Signed)
Patient dropped off FMLA paperwork to be completed. Last DOS was 04/27/23. Placed in [employer].

## 2023-05-05 NOTE — Telephone Encounter (Signed)
Reviewed paperwork and placed in PCP's box for completion.  .Janashia Parco R Makailey Hodgkin, CMA' 

## 2023-05-05 NOTE — Telephone Encounter (Signed)
Patient came in stating that where her physical therapy referral was placed does not accept her insurance. Asks if the referral can be sent to the [facility] on [facility] please.

## 2023-05-16 ENCOUNTER — Encounter: Payer: Self-pay | Admitting: Student

## 2023-05-16 NOTE — Telephone Encounter (Signed)
Patient LVM on nurse line checking the status of FMLA paperwork.   Do we have an update on this?

## 2023-05-17 ENCOUNTER — Telehealth: Payer: Self-pay | Admitting: Student

## 2023-05-17 NOTE — Telephone Encounter (Signed)
Called patient to inquire about FMLA paperwork. Inquiring what patient needing from FMLA (Time off, leave of absence, time for appointments, ect).  Left VM asking patient to call back. Please ask patient what she needs from the FMLA/what she needs it for. Her medical conditions make sense, I just don't know what she is looking to get from the [facility] paperwork.

## 2023-05-17 NOTE — Telephone Encounter (Signed)
Called patient to inquire about FMLA paperwork.  Patient left FMLA paperwork to be filled out.  Provider unsure of what patient needs it filled out for.  Provider wondering if patient is wanting time off, or extended leave of absence, or decrease activities at work, or decreased work schedule.  Provider needing to know what patient is looking for specifically with the FMLA before he can fill it out.  Called patient to inquire and left voicemail asking for return call.  Questions to be answered: - Why does patient need FMLA filled out/what are they looking to get (Decreased work hours, time off, extended leave of absence, periodic time off for appointments, ECT)  Provider will complete paperwork once he knows what patient needs paperwork for.

## 2023-05-18 ENCOUNTER — Encounter: Payer: Self-pay | Admitting: Student

## 2023-05-18 ENCOUNTER — Telehealth: Payer: Self-pay | Admitting: Student

## 2023-05-18 NOTE — Telephone Encounter (Signed)
Called to discuss imaging that showed patient has arthritis in knee and low back. This can be acutely flaring/ causing her symptoms.   Also called to discus FMLA paperwork. Provider unsure what patient needs from the [facility] paperwork (time off, leave of absence, reduced hours, reduced duties, ect). Patient has good reason for FMLA with all her doctors visits, just unsure what she is hoping to get from this paperwork.   Left VM requesting call back.   If patient calls back, rout message to Dr. [name] Noon, as she is PCP and can see to the is patient's issues sooner. I will be out of the office for the next week.

## 2023-05-18 NOTE — Telephone Encounter (Signed)
Patient LVM on nurse line returning call to Dr. Barbaraann Faster.   She apologizes for missing call as she is currently at work.   She states that she will try to be available for when provider calls back. Veronda Prude, RN

## 2023-05-18 NOTE — Telephone Encounter (Signed)
Called patient x 3 to inquire about FMLA paperwork. Unsure what she needs it for, so I cannot complete. I will be out of the office for the next week. If patient calls back, route message to Dr. [name] Noon to have paperwork completed.

## 2023-05-19 NOTE — Telephone Encounter (Signed)
Patient calls nurse line in regards to [facility] and requests to speak with [place].   Patient advised of mychart message sent to her by PCP.   Patient encouraged to respond with PCP and start communication in regards to [facility].

## 2023-05-22 NOTE — Progress Notes (Unsigned)
Cardiology Office Note   Date:  05/24/2023  ID:  Stacy Campos, Stacy Campos 12/06/1970, MRN [id number] PCP:  Darral Dash, DO [facility] Cardiologist: Nanetta Batty, MD  Reason for visit: 6 month follow-up  History of Present Illness    Stacy Campos is a 52 y.o. female with a hx of hypertension, atypical chest pain, palpitations, sciatica.  She last saw Dr. Allyson Sabal in May 2024.  Patient was noted to have BMI of 50, she had gained 30 pounds the past 6 years.  She was referred to [facility].  Today, she states she has a lot of stress.  She states she is helping take care of her mother and stepfather.  She mentions her 47 year old son who has pneumonia.  She has medical issues she feels like is impacting her work schedule.  She mentions a possible skin infection on her chest that started 3 weeks ago, after self treating this is almost resolved.  She also has had vision changes for over the past year and is going to follow-up with the ophthalmologist.  Otherwise, patient denies exertional chest discomfort, significant shortness of breath, PND, orthopnea, lower extremity swelling, lightheadedness and syncope.  Her palpitations have been well-controlled with Toprol-XL 75 mg daily.  She mentions her systolic blood pressure is often 130s to 150s.  She states she was unable to afford copayments to go to the [facility] for healthy weight and wellness.  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   Objective / Physical Exam   EKG today: Normal sinus rhythm, heart rate 90.  GEN: No acute distress, obese NECK: No carotid bruits CARDIAC: RRR, no murmurs RESPIRATORY:  Clear to auscultation without rales, wheezing or rhonchi  EXTREMITIES: No edema  Assessment and Plan   Palpitations, well controlled -Continue Toprol XL 75 mg daily.  Refill today.  Hypertension, BP elevated -Recommend starting amlodipine 5 mg daily.  Keep a blood pressure log checking blood pressure  approximately 3 times weekly and bring to follow-up in 2 months. -Goal BP is <130/80.  Recommend DASH diet (high in vegetables, fruits, low-fat dairy products, whole grains, poultry, fish, and nuts and low in sweets, sugar-sweetened beverages, and red meats), salt restriction and increase physical activity.  Hyperlipidemia with goal LDL less than 70 -LDL 92 in December 2023.  Crestor was increased to 20 mg.  Will recheck fasting lipids today. -Recommend cholesterol lowering diets - Mediterranean diet, DASH diet, vegetarian diet, low-carbohydrate diet and avoidance of trans fats.  Discussed healthier choice substitutes.  Nuts, high-fiber foods, and fiber supplements may also improve lipids.    Obesity with BMI 50 -Even a 5-10% weight loss can have cardiovascular benefits.   -Recommend moderate intensity activity for 30 minutes 5 days/week and the DASH diet.  Disposition - Follow-up in 2 months.   Signed, Cannon Kettle, PA-C  05/24/2023 [facility]

## 2023-05-24 ENCOUNTER — Ambulatory Visit: Payer: 59 | Admitting: Physician Assistant

## 2023-05-24 ENCOUNTER — Encounter: Payer: Self-pay | Admitting: Physician Assistant

## 2023-05-24 ENCOUNTER — Ambulatory Visit: Payer: 59 | Attending: Physician Assistant | Admitting: Physician Assistant

## 2023-05-24 VITALS — BP 132/90 | HR 90 | Ht 67.0 in | Wt 320.2 lb

## 2023-05-24 DIAGNOSIS — R002 Palpitations: Secondary | ICD-10-CM | POA: Diagnosis not present

## 2023-05-24 DIAGNOSIS — I10 Essential (primary) hypertension: Secondary | ICD-10-CM

## 2023-05-24 DIAGNOSIS — E782 Mixed hyperlipidemia: Secondary | ICD-10-CM

## 2023-05-24 MED ORDER — ROSUVASTATIN CALCIUM 20 MG PO TABS: 20.0000 mg | ORAL_TABLET | Freq: Every day | ORAL | 3 refills | Status: DC

## 2023-05-24 MED ORDER — METOPROLOL SUCCINATE ER 25 MG PO TB24: 75.0000 mg | ORAL_TABLET | Freq: Every day | ORAL | 3 refills | Status: DC

## 2023-05-24 MED ORDER — AMLODIPINE BESYLATE 5 MG PO TABS: 5.0000 mg | ORAL_TABLET | Freq: Every day | ORAL | 3 refills | Status: DC

## 2023-05-24 NOTE — Telephone Encounter (Signed)
Patient returns call to nurse line asking to speak with PCP regarding FMLA paperwork.   Please return call to [phone].  Veronda Prude, RN

## 2023-05-24 NOTE — Patient Instructions (Signed)
Medication Instructions:  Start Amlodipine 5 mg ( Take 1 Tablet Daily). *If you need a refill on your cardiac medications before your next appointment, please call your pharmacy*   Lab Work: CMET , Lipid Panel If you have labs (blood work) drawn today and your tests are completely normal, you will receive your results only by: MyChart Message (if you have MyChart) OR A paper copy in the mail If you have any lab test that is abnormal or we need to change your treatment, we will call you to review the results.   Testing/Procedures: No Testing   Follow-Up: At [facility], you and your health needs are our priority.  As part of our continuing mission to provide you with exceptional heart care, we have created designated Provider Care Teams.  These Care Teams include your primary Cardiologist (physician) and Advanced Practice Providers (APPs -  Physician Assistants and Nurse Practitioners) who all work together to provide you with the care you need, when you need it.  We recommend signing up for the patient portal called "MyChart".  Sign up information is provided on this After Visit Summary.  MyChart is used to connect with patients for Virtual Visits (Telemedicine).  Patients are able to view lab/test results, encounter notes, upcoming appointments, etc.  Non-urgent messages can be sent to your provider as well.   To learn more about what you can do with MyChart, go to [email or web address].    Your next appointment:   2 month(s)  Provider:   Micah Flesher, PA-C

## 2023-05-25 ENCOUNTER — Telehealth: Payer: Self-pay | Admitting: Student

## 2023-05-25 LAB — COMPREHENSIVE METABOLIC PANEL
ALT: 13 [IU]/L (ref 0–32)
AST: 14 [IU]/L (ref 0–40)
Albumin: 4.1 g/dL (ref 3.8–4.9)
Alkaline Phosphatase: 92 [IU]/L (ref 44–121)
BUN/Creatinine Ratio: 13 (ref 9–23)
BUN: 11 mg/dL (ref 6–24)
Bilirubin Total: 0.3 mg/dL (ref 0.0–1.2)
CO2: 23 mmol/L (ref 20–29)
Calcium: 8.9 mg/dL (ref 8.7–10.2)
Chloride: 104 mmol/L (ref 96–106)
Creatinine, Ser: 0.86 mg/dL (ref 0.57–1.00)
Globulin, Total: 3.6 g/dL (ref 1.5–4.5)
Glucose: 95 mg/dL (ref 70–99)
Potassium: 4.4 mmol/L (ref 3.5–5.2)
Sodium: 142 mmol/L (ref 134–144)
Total Protein: 7.7 g/dL (ref 6.0–8.5)
eGFR: 81 mL/min/{1.73_m2} (ref >59)

## 2023-05-25 LAB — LIPID PANEL
Chol/HDL Ratio: 4.4 ratio (ref 0.0–4.4)
Cholesterol, Total: 185 mg/dL (ref 100–199)
HDL: 42 mg/dL (ref >39)
LDL Chol Calc (NIH): 131 mg/dL — ABNORMAL HIGH (ref 0–99)
Triglycerides: 64 mg/dL (ref 0–149)
VLDL Cholesterol Cal: 12 mg/dL (ref 5–40)

## 2023-05-25 NOTE — Telephone Encounter (Signed)
Called patient to clarify FMLA needs.  Desires intermittent FMLA for vision and back pain. Wants leave to go to doctor's appointments 2 full days per week for 8 weeks - Physical therapy for her back/hip pain - Neurologist, dermatoligst, PCP  For her vision, she uses a computer 10 hours a day and she is having blurred vision/missing things and struggling. The earliest appointment for an ophthalmologist is in December. She is using reader's glasses.  Darral Dash, DO

## 2023-05-26 ENCOUNTER — Telehealth: Payer: Self-pay

## 2023-05-26 NOTE — Telephone Encounter (Addendum)
Called patient regarding results. Left message for patient to call office.----- Message from Cannon Kettle sent at 05/26/2023  9:15 AM EDT ----- LDL (bad cholesterol) worse than 1 year ago.  -Ensure patient is taking Crestor 20mg  daily.  If taking Crestor 20mg  daily, recommend increasing dose to Crestor 40mg  daily and recheck cholesterol in 3 months.   -Recommend cholesterol lowering diets - Mediterranean diet, DASH diet, vegetarian diet, low-carbohydrate diet and avoidance of trans fats.  Discussed healthier choice substitutes.  Nuts, high-fiber foods, and fiber supplements may also improve lipids.

## 2023-05-30 ENCOUNTER — Encounter: Payer: Self-pay | Admitting: Family Medicine

## 2023-05-30 ENCOUNTER — Ambulatory Visit (INDEPENDENT_AMBULATORY_CARE_PROVIDER_SITE_OTHER): Payer: 59 | Admitting: Family Medicine

## 2023-05-30 ENCOUNTER — Other Ambulatory Visit: Payer: Self-pay

## 2023-05-30 VITALS — BP 109/60 | HR 78 | Ht 67.0 in | Wt 321.6 lb

## 2023-05-30 DIAGNOSIS — L03313 Cellulitis of chest wall: Secondary | ICD-10-CM | POA: Insufficient documentation

## 2023-05-30 MED ORDER — DOXYCYCLINE HYCLATE 100 MG PO TABS
100.0000 mg | ORAL_TABLET | Freq: Two times a day (BID) | ORAL | 0 refills | Status: DC
Start: 2023-05-30 — End: 2023-08-08

## 2023-05-30 NOTE — Patient Instructions (Signed)
Good to see you today - Thank you for coming in  Things we discussed today:  Infection of chest wall - take doxycycline twice a day for the next ten day - use warm compresses at least twice a day - if worsening with fever or increasing size or not better in 4 days then come back or call us - if high fever and feeling very bad go to the ER   See your dermatologist for further HS treatment

## 2023-05-30 NOTE — Assessment & Plan Note (Signed)
Given tenderness and acute onset is consistent with soft tissue infection. Does not feel like an abscess currently but exam is limited due to location and body habitus.  Will treat with MRSA antibiotic, doxycycline, and monitor.  If persists or worsens may need [place] or I&D.  Recommend routine mammogram once this resolves

## 2023-05-30 NOTE — Progress Notes (Signed)
    SUBJECTIVE:   CHIEF COMPLAINT / HPI:   Chest Tenderness Started suddenly awoke from sleep with pain just above and between her breasts.  Feels vaguely swollen.  She tried to squeeze it 2 days ago but did not help and no discharge   Feels different from her usual HS.  She takes rifampin and clindamycin three times a day - last dose yesterday.   No fever or other tender masses or breast discharge Overall feels well  PERTINENT  PMH / PSH: CVA, Chiari 1, Hypertension    OBJECTIVE:   BP 109/60   Pulse 78   Ht 5\' 7"  (1.702 m)   Wt (!) 321 lb 9.6 oz (145.9 kg)   LMP 10/25/2018   SpO2 100%   BMI 50.37 kg/m   Tender diffusely just above and between her upper breasts.  Mildly warm.  No definite redness.  Focally tender area in midline with vague induration measuring about 2 cm.  No fluctuance.  No other masses  ASSESSMENT/PLAN:   Cellulitis of chest wall Assessment & Plan: Given tenderness and acute onset is consistent with soft tissue infection. Does not feel like an abscess currently but exam is limited due to location and body habitus.  Will treat with MRSA antibiotic, doxycycline, and monitor.  If persists or worsens may need [place] or I&D.  Recommend routine mammogram once this resolves   Orders: -     Doxycycline Hyclate; Take 1 tablet (100 mg total) by mouth 2 (two) times daily.  Dispense: 20 tablet; Refill: 0     Patient Instructions  Good to see you today - Thank you for coming in  Things we discussed today:  Infection of chest wall - take doxycycline twice a day for the next ten day - use warm compresses at least twice a day - if worsening with fever or increasing size or not better in 4 days then come back or call us - if high fever and feeling very bad go to the ER   See your dermatologist for further HS treatment    Carney Living, MD [facility]

## 2023-05-31 ENCOUNTER — Telehealth: Payer: Self-pay

## 2023-05-31 NOTE — Telephone Encounter (Addendum)
Called patient regarding results. Left message for patient to call office.----- Message from Cannon Kettle sent at 05/26/2023  9:15 AM EDT ----- LDL (bad cholesterol) worse than 1 year ago.  -Ensure patient is taking Crestor 20mg  daily.  If taking Crestor 20mg  daily, recommend increasing dose to Crestor 40mg  daily and recheck cholesterol in 3 months.   -Recommend cholesterol lowering diets - Mediterranean diet, DASH diet, vegetarian diet, low-carbohydrate diet and avoidance of trans fats.  Discussed healthier choice substitutes.  Nuts, high-fiber foods, and fiber supplements may also improve lipids.

## 2023-06-01 ENCOUNTER — Telehealth: Payer: Self-pay | Admitting: Physician Assistant

## 2023-06-01 DIAGNOSIS — E782 Mixed hyperlipidemia: Secondary | ICD-10-CM

## 2023-06-01 MED ORDER — ROSUVASTATIN CALCIUM 40 MG PO TABS: 40.0000 mg | ORAL_TABLET | Freq: Every day | ORAL | 3 refills | Status: DC

## 2023-06-01 NOTE — Telephone Encounter (Signed)
Follow Up: ? ? ? ?Patient is returning call from yesterday, concerning her lab results. ?

## 2023-06-01 NOTE — Telephone Encounter (Signed)
Cannon Kettle, PA-C 05/26/2023  9:15 AM EDT     LDL (bad cholesterol) worse than 1 year ago.   -Ensure patient is taking Crestor 20mg  daily.  If taking Crestor 20mg  daily, recommend increasing dose to Crestor 40mg  daily and recheck cholesterol in 3 months.   -Recommend cholesterol lowering diets - Mediterranean diet, DASH diet, vegetarian diet, low-carbohydrate diet and avoidance of trans fats.  Discussed healthier choice substitutes.  Nuts, high-fiber foods, and fiber supplements may also improve lipids.     Pt notified and voiced understanding. Orders placed.

## 2023-06-06 NOTE — Telephone Encounter (Signed)
Patient is calling to see if her FMLA was completed. Patient would like for someone to call her and let her know the status as soon as possible.   The best call back is [phone]

## 2023-06-12 ENCOUNTER — Ambulatory Visit: Payer: 59 | Admitting: Physical Therapy

## 2023-06-12 ENCOUNTER — Encounter: Payer: Self-pay | Admitting: Student

## 2023-06-13 ENCOUNTER — Ambulatory Visit: Payer: 59

## 2023-06-19 ENCOUNTER — Ambulatory Visit: Payer: 59 | Attending: Family Medicine | Admitting: Physical Therapy
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
## 2023-06-29 ENCOUNTER — Telehealth: Payer: Self-pay

## 2023-06-29 DIAGNOSIS — E782 Mixed hyperlipidemia: Secondary | ICD-10-CM

## 2023-06-29 MED ORDER — ROSUVASTATIN CALCIUM 40 MG PO TABS: 40.0000 mg | ORAL_TABLET | Freq: Every day | ORAL | 3 refills | Status: AC

## 2023-06-29 NOTE — Telephone Encounter (Addendum)
Called patient regarding results. Left detailed message for patient . Advised sending new prescription for increasing Crestor to 40 mg daily. Patient advised to repeat cholesterol in 3 months. Letter and Lab order mailed to patient  on 06/29/23.----- Message from Cannon Kettle sent at 05/26/2023  9:15 AM EDT ----- LDL (bad cholesterol) worse than 1 year ago.  -Ensure patient is taking Crestor 20mg  daily.  If taking Crestor 20mg  daily, recommend increasing dose to Crestor 40mg  daily and recheck cholesterol in 3 months.   -Recommend cholesterol lowering diets - Mediterranean diet, DASH diet, vegetarian diet, low-carbohydrate diet and avoidance of trans fats.  Discussed healthier choice substitutes.  Nuts, high-fiber foods, and fiber supplements may also improve lipids.
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
## 2023-07-13 ENCOUNTER — Ambulatory Visit: Payer: 59 | Attending: Physician Assistant | Admitting: Physician Assistant

## 2023-07-14 ENCOUNTER — Other Ambulatory Visit: Payer: Self-pay | Admitting: Student

## 2023-07-14 DIAGNOSIS — M25551 Pain in right hip: Secondary | ICD-10-CM

## 2023-07-14 NOTE — Progress Notes (Signed)
New rx for pt.

## 2023-08-08 ENCOUNTER — Other Ambulatory Visit: Payer: Self-pay | Admitting: Family Medicine

## 2023-08-08 ENCOUNTER — Ambulatory Visit (INDEPENDENT_AMBULATORY_CARE_PROVIDER_SITE_OTHER): Payer: 59 | Admitting: Student

## 2023-08-08 VITALS — BP 152/103 | HR 103 | Temp 98.3°F | Ht 67.0 in | Wt 328.0 lb

## 2023-08-08 DIAGNOSIS — L0291 Cutaneous abscess, unspecified: Secondary | ICD-10-CM

## 2023-08-08 DIAGNOSIS — E782 Mixed hyperlipidemia: Secondary | ICD-10-CM | POA: Diagnosis not present

## 2023-08-08 DIAGNOSIS — Z1231 Encounter for screening mammogram for malignant neoplasm of breast: Secondary | ICD-10-CM | POA: Diagnosis not present

## 2023-08-08 DIAGNOSIS — E669 Obesity, unspecified: Secondary | ICD-10-CM | POA: Diagnosis not present

## 2023-08-08 DIAGNOSIS — L732 Hidradenitis suppurativa: Secondary | ICD-10-CM | POA: Diagnosis not present

## 2023-08-08 LAB — POCT GLYCOSYLATED HEMOGLOBIN (HGB A1C): Hemoglobin A1C: 6.1 % — AB (ref 4.0–5.6)

## 2023-08-08 MED ORDER — DOXYCYCLINE HYCLATE 100 MG PO TABS: 100.0000 mg | ORAL_TABLET | Freq: Two times a day (BID) | ORAL | 0 refills | Status: AC

## 2023-08-08 MED ORDER — HYDROCHLOROTHIAZIDE 25 MG PO TABS: 25.0000 mg | ORAL_TABLET | Freq: Every day | ORAL | 3 refills | Status: DC
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
## 2023-08-08 NOTE — Assessment & Plan Note (Signed)
 Palpable, firm 2 to 3 cm abscess on chest wall and between breast Given persistence since 3 months ago, ordered ultrasound chest soft tissue to evaluate for pocket/fistula tract -Prescribe doxycycline  100 mg twice daily for 7 days -May take Tylenol  or ibuprofen  as needed for pain -Warm compresses -Referral sent to dermatology to discuss further management of hidradenitis -Return precautions should she develop fever, chills, worsening of symptoms

## 2023-08-08 NOTE — Patient Instructions (Addendum)
 It was great meeting you today.  As we discussed, -I sent doxycycline  to your pharmacy for the abscess, take this for 7 days -I sent hydrochlorothiazide  25 mg daily to your pharmacy for blood pressure control. -Please go get your ultrasound completed at the scheduled time  Before you leave today, schedule an appointment for follow-up in 2 weeks so we can recheck your blood pressure, make sure that you are antibiotics are helping, and complete your Pap smear for cervical cancer screening  Will check blood work today.  I will call you if anything is abnormal or if we need to make any changes to your medications.  If you have any questions or concerns, please feel free to call the clinic.   Have a wonderful day,  Dr. Barabara Dama [facility] [phone]

## 2023-08-09 LAB — LDL CHOLESTEROL, DIRECT: LDL Direct: 128 mg/dL — ABNORMAL HIGH (ref 0–99)

## 2023-08-22 ENCOUNTER — Ambulatory Visit (HOSPITAL_COMMUNITY)
Admission: EM | Admit: 2023-08-22 | Discharge: 2023-08-22 | Disposition: A | Discharge status: Other Acute Inpatient Hospital | Payer: 59

## 2023-08-22 ENCOUNTER — Other Ambulatory Visit: Payer: Self-pay

## 2023-08-22 ENCOUNTER — Emergency Department (HOSPITAL_COMMUNITY)
Admission: EM | Admit: 2023-08-22 | Discharge: 2023-08-22 | Disposition: A | Discharge status: Home / Self Care | Payer: 59 | Attending: Emergency Medicine | Admitting: Emergency Medicine

## 2023-08-22 ENCOUNTER — Encounter (HOSPITAL_COMMUNITY): Payer: Self-pay | Admitting: Emergency Medicine

## 2023-08-22 ENCOUNTER — Emergency Department (HOSPITAL_COMMUNITY): Payer: 59

## 2023-08-22 ENCOUNTER — Encounter (HOSPITAL_COMMUNITY): Payer: Self-pay

## 2023-08-22 DIAGNOSIS — R0989 Other specified symptoms and signs involving the circulatory and respiratory systems: Secondary | ICD-10-CM | POA: Diagnosis not present

## 2023-08-22 DIAGNOSIS — I517 Cardiomegaly: Secondary | ICD-10-CM | POA: Diagnosis not present

## 2023-08-22 DIAGNOSIS — Z79899 Other long term (current) drug therapy: Secondary | ICD-10-CM | POA: Insufficient documentation

## 2023-08-22 DIAGNOSIS — R000 Tachycardia, unspecified: Secondary | ICD-10-CM

## 2023-08-22 DIAGNOSIS — R0602 Shortness of breath: Secondary | ICD-10-CM | POA: Diagnosis not present

## 2023-08-22 DIAGNOSIS — I10 Essential (primary) hypertension: Secondary | ICD-10-CM | POA: Insufficient documentation

## 2023-08-22 DIAGNOSIS — J101 Influenza due to other identified influenza virus with other respiratory manifestations: Secondary | ICD-10-CM | POA: Diagnosis not present

## 2023-08-22 DIAGNOSIS — R059 Cough, unspecified: Secondary | ICD-10-CM | POA: Diagnosis not present

## 2023-08-22 DIAGNOSIS — Z20822 Contact with and (suspected) exposure to covid-19: Secondary | ICD-10-CM | POA: Insufficient documentation

## 2023-08-22 DIAGNOSIS — R0902 Hypoxemia: Secondary | ICD-10-CM | POA: Diagnosis not present

## 2023-08-22 LAB — CBC WITH DIFFERENTIAL/PLATELET
Abs Immature Granulocytes: 0.01 10*3/uL (ref 0.00–0.07)
Basophils Absolute: 0 10*3/uL (ref 0.0–0.1)
Basophils Relative: 0 %
Eosinophils Absolute: 0 10*3/uL (ref 0.0–0.5)
Eosinophils Relative: 0 %
HCT: 38.3 % (ref 36.0–46.0)
Hemoglobin: 11.9 g/dL — ABNORMAL LOW (ref 12.0–15.0)
Immature Granulocytes: 0 %
Lymphocytes Relative: 53 %
Lymphs Abs: 2.7 10*3/uL (ref 0.7–4.0)
MCH: 26.6 pg (ref 26.0–34.0)
MCHC: 31.1 g/dL (ref 30.0–36.0)
MCV: 85.5 fL (ref 80.0–100.0)
Monocytes Absolute: 0.7 10*3/uL (ref 0.1–1.0)
Monocytes Relative: 14 %
Neutro Abs: 1.7 10*3/uL (ref 1.7–7.7)
Neutrophils Relative %: 33 %
Platelets: 229 10*3/uL (ref 150–400)
RBC: 4.48 MIL/uL (ref 3.87–5.11)
RDW: 13.9 % (ref 11.5–15.5)
WBC: 5.2 10*3/uL (ref 4.0–10.5)
nRBC: 0 % (ref 0.0–0.2)

## 2023-08-22 LAB — COMPREHENSIVE METABOLIC PANEL
ALT: 17 U/L (ref 0–44)
AST: 20 U/L (ref 15–41)
Albumin: 3.5 g/dL (ref 3.5–5.0)
Alkaline Phosphatase: 63 U/L (ref 38–126)
Anion gap: 12 (ref 5–15)
BUN: 8 mg/dL (ref 6–20)
CO2: 25 mmol/L (ref 22–32)
Calcium: 8.6 mg/dL — ABNORMAL LOW (ref 8.9–10.3)
Chloride: 98 mmol/L (ref 98–111)
Creatinine, Ser: 1.18 mg/dL — ABNORMAL HIGH (ref 0.44–1.00)
GFR, Estimated: 55 mL/min — ABNORMAL LOW (ref >60)
Glucose, Bld: 101 mg/dL — ABNORMAL HIGH (ref 70–99)
Potassium: 3.7 mmol/L (ref 3.5–5.1)
Sodium: 135 mmol/L (ref 135–145)
Total Bilirubin: 0.7 mg/dL (ref 0.0–1.2)
Total Protein: 7.8 g/dL (ref 6.5–8.1)

## 2023-08-22 LAB — RESP PANEL BY RT-PCR (RSV, FLU A&B, COVID)  RVPGX2
Influenza A by PCR: POSITIVE — AB
Influenza B by PCR: NEGATIVE
Resp Syncytial Virus by PCR: NEGATIVE
SARS Coronavirus 2 by RT PCR: NEGATIVE

## 2023-08-22 MED ORDER — BENZONATATE 100 MG PO CAPS: 100.0000 mg | ORAL_CAPSULE | Freq: Three times a day (TID) | ORAL | 0 refills | Status: DC

## 2023-08-22 MED ORDER — ONDANSETRON 4 MG PO TBDP: 4.0000 mg | ORAL_TABLET | Freq: Three times a day (TID) | ORAL | 0 refills | Status: AC | PRN

## 2023-08-22 MED ORDER — OSELTAMIVIR PHOSPHATE 75 MG PO CAPS: 75.0000 mg | ORAL_CAPSULE | Freq: Two times a day (BID) | ORAL | 0 refills | Status: DC

## 2023-08-22 NOTE — Discharge Instructions (Addendum)
Go to ER for further evaluation .

## 2023-08-22 NOTE — ED Triage Notes (Addendum)
Pt presents from UC for CP and cough x 2 days, UC stated 88% on RA. Family has RSV  UC reported confusion but none noted by EMS.  She was placed on 3L O2 which improved sats to 96%.  Has tried theraflu and tylenol at home.  EMS: 96% on 2L [place], 99.42F, 138/77, 98bpm,17RR  Later in triage at 1208 pt trialed on room air, maintained 95% RA at rest. A&Ox4

## 2023-08-22 NOTE — ED Triage Notes (Signed)
SoB, chest pain X2 days. Patient states she has been around someone positive for RSV. No history of respiratory issues. Tried Theraflu and tylenol with relief of her fever.

## 2023-08-22 NOTE — ED Provider Notes (Signed)
Patient presents here with shortness of breath, chest pain, and fever that began yesterday. Patient states that she was recently around someone who tested positive for RSV and is concerned for same. Hx of HTN. Patient reports not taking blood pressure medication today. Mildy hypertensive at 144/96.  During triage RN stated that patient was falling asleep while asking questions and her SpO2 dropped to 88% and her HR was 170. Patient placed on 2L O2.   Upon assessment patient is very drowsy and slow to respond to questions. Difficult to arouse, but answers questions appropriately with slow responses. Upon auscultation and request for patient to take deep breaths, patient takes quick, shallow breaths making it difficult to auscultate lung sounds. No obvious wheezing, rhonchi, or rales.  EKG revealed sinus tachycardia without obvious signs of MI or cardiac emergency.   Due to drowsiness and hypoxia I believe patient would benefit from further evaluation in the ER. Discussed this plan with patient and she is agreeable to plan at this time. [facility] called to transport patient to ER due to new need for oxygen supplementation.    Wynonia Lawman A, NP 08/22/23 1109

## 2023-08-22 NOTE — ED Notes (Signed)
[facility] CALLED AND IN ROUTE.

## 2023-08-22 NOTE — ED Provider Triage Note (Signed)
Emergency Medicine Provider Triage Evaluation Note  Stacy Campos , a 53 y.o. female  was evaluated in triage.  Pt complains of cough. Pt had positive RSV exposure. Some confusion noted by UC however EMS states this is normal.  Review of Systems  Positive: Cough, SOB Negative: CP, Abd pain  Physical Exam  BP (!) 158/100 (BP Location: Right Wrist)   Pulse 98   Temp 98.9 F (37.2 C) (Oral)   Resp (!) 24   Wt (!) 148 kg   LMP 10/25/2018   SpO2 95%   BMI 51.10 kg/m  Gen:   Awake, no distress, On O2 via [place] appears stable Resp:  Normal effort, On O3 via [place], in no distress MSK:   Moves extremities without difficulty  Medical Decision Making  Medically screening exam initiated at 12:32 PM.  Appropriate orders placed.  Cathleen Yagi was informed that the remainder of the evaluation will be completed by another provider, this initial triage assessment does not replace that evaluation, and the importance of remaining in the ED until their evaluation is complete.  Workup initiated to include Covid, Flu, RSV PCR, labs, CXR. Pt stable for the lobby.   Ernie Avena, MD 08/22/23 [phone]

## 2023-08-22 NOTE — Discharge Instructions (Signed)
You tested positive for influenza A today.  Other labs were reassuring. Oxygen testing was stable on re-checks, however if this worsens you will need to return to the ED. We are starting tamiflu, cough medication, and nausea medication you can use as needed.  Tylenol or motrin for fever. Make sure to rest and hydrate. Follow-up with your primary care doctor. Return here for new concerns-- uncontrolled fever, worsening breathing, etc.

## 2023-08-22 NOTE — ED Provider Notes (Signed)
[place] EMERGENCY DEPARTMENT AT [facility] Provider Note   CSN: [id number] Arrival date & time: 08/22/23  1121     History {Add pertinent medical, surgical, social history, OB history to HPI:1} Chief Complaint  Patient presents with   Cough    Stacy Campos is a 53 y.o. female.  The history is provided by the patient and medical records.   For 54-year-old female with history of hypertension, hyperlipidemia, obesity, Chiari malformation, presenting to the ED with cough and shortness of breath over the past 2 days.  Her mother is sick with RSV and admitted to the hospital.  She began feeling poorly on Sunday.  Initially presented to urgent care today and found to be hypoxic to 88% so was sent to the ER.  She was on supplemental oxygen for a brief period but then able to maintain on room air for the past several hours in the waiting room.  She did have some nausea and vomiting yesterday, none today.  Home Medications Prior to Admission medications   Medication Sig Start Date End Date Taking? Authorizing Provider  benzonatate (TESSALON) 100 MG capsule Take 1 capsule (100 mg total) by mouth every 8 (eight) hours. 08/22/23  Yes Garlon Hatchet, PA-C  ondansetron (ZOFRAN-ODT) 4 MG disintegrating tablet Take 1 tablet (4 mg total) by mouth every 8 (eight) hours as needed for nausea. 08/22/23  Yes Garlon Hatchet, PA-C  oseltamivir (TAMIFLU) 75 MG capsule Take 1 capsule (75 mg total) by mouth every 12 (twelve) hours. 08/22/23  Yes Garlon Hatchet, PA-C  cholecalciferol (VITAMIN D3) 25 MCG (1000 UNIT) tablet Take 1,000 Units by mouth daily.    [provider]  hydrochlorothiazide (HYDRODIURIL) 25 MG tablet Take 1 tablet (25 mg total) by mouth daily. 08/08/23   Dameron, Nolberto Hanlon, DO  metoprolol succinate (TOPROL-XL) 25 MG 24 hr tablet Take 3 tablets (75 mg total) by mouth daily. 05/24/23   Cannon Kettle, PA-C  Multiple Vitamin (MULTIVITAMIN WITH MINERALS) TABS tablet Take 1  tablet by mouth daily.    [provider]  rosuvastatin (CRESTOR) 40 MG tablet Take 1 tablet (40 mg total) by mouth daily. 06/29/23 09/27/23  Cannon Kettle, PA-C      Allergies    Valsartan, Onion, and Dimetapp [albertsons di bromm]    Review of Systems   Review of Systems  Respiratory:  Positive for cough and shortness of breath.   All other systems reviewed and are negative.   Physical Exam Updated Vital Signs BP (!) 165/95   Pulse (!) 108   Temp 99.6 F (37.6 C) (Oral)   Resp 16   Wt (!) 148 kg   LMP 10/25/2018   SpO2 96%   BMI 51.10 kg/m   Physical Exam Vitals and nursing note reviewed.  Constitutional:      Appearance: She is well-developed.  HENT:     Head: Normocephalic and atraumatic.  Eyes:     Conjunctiva/sclera: Conjunctivae normal.     Pupils: Pupils are equal, round, and reactive to light.  Cardiovascular:     Rate and Rhythm: Normal rate and regular rhythm.     Heart sounds: Normal heart sounds.  Pulmonary:     Effort: Pulmonary effort is normal.     Breath sounds: Normal breath sounds.     Comments: Dry cough on exam but lungs are clear without wheezes or rhonchi, O2 sats 95 to 99% on room air Abdominal:     General: Bowel sounds are  normal.     Palpations: Abdomen is soft.  Musculoskeletal:        General: Normal range of motion.     Cervical back: Normal range of motion.  Skin:    General: Skin is warm and dry.  Neurological:     Mental Status: She is alert and oriented to person, place, and time.     ED Results / Procedures / Treatments   Labs (all labs ordered are listed, but only abnormal results are displayed) Labs Reviewed  RESP PANEL BY RT-PCR (RSV, FLU A&B, COVID)  RVPGX2 - Abnormal; Notable for the following components:      Result Value   Influenza A by PCR POSITIVE (*)    All other components within normal limits  CBC WITH DIFFERENTIAL/PLATELET - Abnormal; Notable for the following components:   Hemoglobin 11.9 (*)     All other components within normal limits  COMPREHENSIVE METABOLIC PANEL - Abnormal; Notable for the following components:   Glucose, Bld 101 (*)    Creatinine, Ser 1.18 (*)    Calcium 8.6 (*)    GFR, Estimated 55 (*)    All other components within normal limits    EKG EKG Interpretation Date/Time:  Tuesday August 22 2023 11:31:15 EST Ventricular Rate:  107 PR Interval:  146 QRS Duration:  82 QT Interval:  350 QTC Calculation: 467 R Axis:   32  Text Interpretation: Sinus tachycardia Possible Left atrial enlargement Cannot rule out Anterior infarct , age undetermined Abnormal ECG When compared with ECG of 22-Aug-2023 11:01, No significant change was found Confirmed by Dione Booze ([id number]) on 08/22/2023 11:24:56 PM  Radiology DG Chest 2 View Result Date: 08/22/2023 CLINICAL DATA:  Shortness of breath EXAM: CHEST - 2 VIEW COMPARISON:  04/24/2022 FINDINGS: Cardiomegaly. Mild pulmonary vascular congestion. No focal airspace consolidation, pleural effusion, or pneumothorax. IMPRESSION: Cardiomegaly with mild pulmonary vascular congestion. Electronically Signed   By: Duanne Guess D.O.   On: 08/22/2023 13:43    Procedures Procedures  {Document cardiac monitor, telemetry assessment procedure when appropriate:1}  Medications Ordered in ED Medications - No data to display  ED Course/ Medical Decision Making/ A&P  {   Click here for ABCD2, HEART and other calculatorsREFRESH Note before signing :1}                              Medical Decision Making Amount and/or Complexity of Data Reviewed Labs: ordered. Radiology: ordered and independent interpretation performed. ECG/medicine tests: ordered and independent interpretation performed.  Risk Prescription drug management.   53 year old female here with cough, congestion, fever, and generally feeling unwell since Sunday.  Mother currently hospitalized with RSV.  Seen at urgent care he was briefly hypoxic so sent to the ER.   She was tapered back down to room air and has been maintaining since then.  She does have dry cough on exam but is in no acute respiratory distress.  Workup initiated from triage -- patient has tested positive for influenza A.  This is likely etiology of her symptoms.  Labs are grossly reassuring.  Chest x-ray without focal infiltrates, does have some cardiomegaly.  She did require supplemental oxygen for a brief period, however has been on room air for the past several hours.  She is strongly desiring to go home.  I personally ambulated her up and down hallway and she was able to maintain proper O2 saturations of 95 to 99%.  She did  not have any dizziness or feelings of syncope while doing this.  Feel it is reasonable to discharge.  I symptoms did began within the past 48 hours, she is within the window to initiate Tamiflu and she is agreeable to this.  She will need to rest and hydrate well at home.  Encouraged to follow-up with PCP.  She and daughter at bedside were given strict return precautions for uncontrolled fever, worsening respiratory status, etc. that would necessitate repeat ER visit.  Final Clinical Impression(s) / ED Diagnoses Final diagnoses:  Influenza A    Rx / DC Orders ED Discharge Orders          Ordered    oseltamivir (TAMIFLU) 75 MG capsule  Every 12 hours        08/22/23 2328    benzonatate (TESSALON) 100 MG capsule  Every 8 hours        08/22/23 2328    ondansetron (ZOFRAN-ODT) 4 MG disintegrating tablet  Every 8 hours PRN        08/22/23 2328

## 2023-08-22 NOTE — ED Notes (Signed)
Patient is being discharged from the Urgent Care and sent to the Emergency Department via [facility] . Per Wynonia Lawman NP, patient is in need of higher level of care due to Altered mental status, hypoxia. Patient is aware and verbalizes understanding of plan of care.  Vitals:   08/22/23 1036 08/22/23 1043  BP: (!) 144/96   Pulse: (!) 107 (!) 170  Resp: (!) 28 (!) 28  Temp: 99.3 F (37.4 C)   SpO2: 96% (!) 88%

## 2023-08-22 NOTE — ED Notes (Signed)
Attempted to call CN ,no answer

## 2023-08-23 ENCOUNTER — Telehealth: Payer: Self-pay

## 2023-08-23 NOTE — Telephone Encounter (Signed)
This RNCM left voicemail message for Amina to advise meds (tessalon 100mg  cap, po every 8 hours #21, zofran 4mg  disintegrating tab every 8 hours PRN #10, tamiflu 75mg  cap po every 12 hours #10) are at the [facility] [phone] [address] and ready for pick up.  No additional TOC needs.

## 2023-08-23 NOTE — Telephone Encounter (Signed)
Received inbound call from Amina who reports she has gone 2 times to pick meds up. This RNCM advised I spoke with [facility] and they advised the medications are there and ready for pick up. This RNCM confirmed the address and phone number of [facility] pharmacy on [address].

## 2023-08-23 NOTE — Telephone Encounter (Signed)
This RNCM spoke with [facility] pharmacy [phone], who reports three meds (tessalon, zofran, tamiflu) are available for pick up.

## 2023-08-23 NOTE — Telephone Encounter (Signed)
Received inbound call from patient's daughter Oneal Grout who reports patient has tried to get herr medications 2x and each time was told meds were not received by [facility].   TOC will follow up with the pharmacy

## 2023-08-29 ENCOUNTER — Telehealth: Payer: Self-pay | Admitting: Student

## 2023-08-29 ENCOUNTER — Ambulatory Visit: Payer: 59 | Admitting: Student

## 2023-08-29 ENCOUNTER — Encounter: Payer: Self-pay | Admitting: Family Medicine

## 2023-08-29 ENCOUNTER — Ambulatory Visit (INDEPENDENT_AMBULATORY_CARE_PROVIDER_SITE_OTHER): Payer: 59 | Admitting: Family Medicine

## 2023-08-29 VITALS — BP 148/92 | HR 95 | Wt 315.0 lb

## 2023-08-29 DIAGNOSIS — J029 Acute pharyngitis, unspecified: Secondary | ICD-10-CM

## 2023-08-29 DIAGNOSIS — I10 Essential (primary) hypertension: Secondary | ICD-10-CM | POA: Diagnosis not present

## 2023-08-29 DIAGNOSIS — L732 Hidradenitis suppurativa: Secondary | ICD-10-CM

## 2023-08-29 MED ORDER — DOXYCYCLINE HYCLATE 100 MG PO TABS: 100.0000 mg | ORAL_TABLET | Freq: Two times a day (BID) | ORAL | 0 refills | Status: AC

## 2023-08-29 MED ORDER — PREDNISONE 20 MG PO TABS: 20.0000 mg | ORAL_TABLET | Freq: Every day | ORAL | 0 refills | Status: AC

## 2023-08-29 NOTE — Telephone Encounter (Signed)
 Reviewed forms and placed in PCP's box for completion.  Glennie Hawk, CMA

## 2023-08-29 NOTE — Patient Instructions (Signed)
 It was wonderful to see you today! Thank you for choosing [facility].   Please bring ALL of your medications with you to every visit.   Today we talked about:  Please take the Doxycycline  twice per day for 7 days for the HS flare and see the derm information below. Please take the Prednisone  daily for 3 days for the shortness of breath and enlarged tonsils.  Please call the dermatologist at the information below:  [facility] [address] [place], [place] [address] [phone] Fax [phone]  Please follow up as needed for persistent symptoms  Call the clinic at [phone] if your symptoms worsen or you have any concerns.  Please be sure to schedule follow up at the front desk before you leave today.   Stacy Nap, DO Family Medicine

## 2023-08-29 NOTE — Assessment & Plan Note (Signed)
148/92 upon repeat, taking HCTZ and metoprolol as prescribed.  Likely elevated in the setting of pain, recommend follow-up in 3 to 4 weeks to assess BP further.

## 2023-08-29 NOTE — Telephone Encounter (Signed)
Patient dropped off FMLA paperwork to be completed.Last DOS was 08/29/23. Placed in [employer].

## 2023-08-29 NOTE — Progress Notes (Signed)
    SUBJECTIVE:   CHIEF COMPLAINT / HPI:   Hospital f/u - Influenza Seen in the ED at [facility] on 1/28 for influenza.  Briefly required supplemental oxygen but ambulated with O2 sat on room air and was discharged.  Received Tamiflu  and completed the course.  Now reports difficulty breathing particularly when laying flat.  States her fever has now broken but does have persistent cough.  Hidradenitis suppurativa flare Reports she developed blisters and worsening sores during her illness.  Reports she is currently having a flare all over her body including both axilla, under her breast bilaterally and in her inguinal folds.  Previously used topical clinda but now is resistant.  Has used doxycycline  previously with relief but is out of the medication.  PERTINENT  PMH / PSH: HTN, Chiari 1 malformation, HS  OBJECTIVE:   BP (!) 148/92   Pulse 95   Wt (!) 315 lb (142.9 kg)   LMP 10/25/2018   SpO2 96%   BMI 49.34 kg/m    General: NAD, pleasant, able to participate in exam HEENT: 3+ tonsillar hypertrophy bilaterally without exudates.  Mild pharyngeal erythema.  No prominent palpable cervical lymph nodes.  Moist mucous membranes Cardiac: RRR, no murmurs. Respiratory: CTAB, normal effort, No wheezes, rales or rhonchi Extremities: no edema or cyanosis. Skin: warm and dry. ~1cm areas of erythema on bilateral axilla without drainage noted.  Significantly tender to palpation. Neuro: alert, no obvious focal deficits Psych: Normal affect and mood  ASSESSMENT/PLAN:   Assessment & Plan Hidradenitis suppurativa Exam consistent with HS flare that is impacting ADLs and causing significant pain, will treat with oral antibiotics given prior resistance to topical clindamycin .  Previously referred to dermatology by PCP, provided information to schedule today as she may be a good candidate for biologics. -Doxycycline  100 mg twice daily x 7 days Pharyngitis, unspecified etiology Reassuringly  normal lung exam for shortness of breath likely secondary to significant tonsillar hypertrophy.  Suspicious of possible OSA component worsened by viral process.  Will treat with steroids to help with inflammation. -Prednisone  20 mg x 3 days -Consider sleep study when clinically improved Essential hypertension 148/92 upon repeat, taking HCTZ and metoprolol  as prescribed.  Likely elevated in the setting of pain, recommend follow-up in 3 to 4 weeks to assess BP further.    Dr. Izetta Nap, DO [facility]

## 2023-09-07 ENCOUNTER — Other Ambulatory Visit (HOSPITAL_COMMUNITY)
Admission: RE | Admit: 2023-09-07 | Discharge: 2023-09-07 | Disposition: A | Discharge status: Home / Self Care | Payer: 59 | Source: Ambulatory Visit | Attending: Family Medicine | Admitting: Family Medicine

## 2023-09-07 ENCOUNTER — Ambulatory Visit (INDEPENDENT_AMBULATORY_CARE_PROVIDER_SITE_OTHER): Payer: 59 | Admitting: Student

## 2023-09-07 VITALS — BP 137/75 | HR 98 | Wt 322.0 lb

## 2023-09-07 DIAGNOSIS — I10 Essential (primary) hypertension: Secondary | ICD-10-CM | POA: Diagnosis not present

## 2023-09-07 DIAGNOSIS — Z124 Encounter for screening for malignant neoplasm of cervix: Secondary | ICD-10-CM | POA: Insufficient documentation

## 2023-09-07 MED ORDER — HYDROCHLOROTHIAZIDE 50 MG PO TABS: 50.0000 mg | ORAL_TABLET | Freq: Every day | ORAL | 3 refills | Status: DC

## 2023-09-07 NOTE — Assessment & Plan Note (Signed)
Pap cytology collected today. Will f/u on results.

## 2023-09-07 NOTE — Progress Notes (Signed)
    SUBJECTIVE:   CHIEF COMPLAINT / HPI: Gyn exam, BP f/u  Due for Pap cytology Here to get Pap smear, not completed at annual exam physical 08/19/2023. Reports some prior bad experiences with pap smears in the past. Last cytology Pap April 2020 with ASCUS, HPV negative. LMP April 2020 Today, denies any genital itching, odor. Does not desire STI screening today  HTN Does not check her BP at home Takes hydrochlorothiazide 25 mg daily and Metoprolol 75 mg daily Has been intolerant to many antihypertensives in the past (reported angioedema w/ ARBs, edema with CCB) No chest pain, SOB, vision changes, headaches.  Social: Elderly mother is currently hospitalized, which has been tough on her.  PERTINENT  PMH / PSH:  HTN Obesity HLD   OBJECTIVE:   BP 137/75   Pulse 98   Wt (!) 322 lb (146.1 kg)   LMP 10/25/2018   SpO2 91%   BMI 50.43 kg/m   General: NAD, well appearing, obese Neuro: A&O Respiratory: normal WOB on RA.  Extremities: Moving all 4 extremities equall GU: Shelly, CMA present as chaperone.  External vulva and vagina nonerythematous, without any obvious rash. Clear discharge appreciated. Normal ruggae of vaginal walls.  Cervix is non erythematous and non-friable.    ASSESSMENT/PLAN:   Encounter for Papanicolaou smear of cervix Pap cytology collected today. Will f/u on results.  Essential hypertension BP 137/75 Continue metoprolol 75 mg daily Increase hydrochlorothiazide to 50 mg daily from 25 mg daily F/u RN visit in 2 weeks for BP check     Darral Dash, DO [facility]

## 2023-09-07 NOTE — Assessment & Plan Note (Addendum)
BP 137/75 Continue metoprolol 75 mg daily Increase hydrochlorothiazide to 50 mg daily from 25 mg daily F/u RN visit in 2 weeks for BP check

## 2023-09-07 NOTE — Patient Instructions (Signed)
It was great seeing you today.  As we discussed, - We completed your pap smear today, I will let you know if your results are abnormal via phone call. If everything is normal, I will send a MyChart message. - Increase your hydrochlorothiazide to 50 mg once daily. I sent a new prescription to your pharmacy for this. You may finish your current supply of 25 mg tablets by taking two tablets daily until you run out. Schedule a nurse visit for blood pressure check at the front desk before you leave today.   If you have any questions or concerns, please feel free to call the clinic.   Have a wonderful day,  Dr. Darral Dash [facility] [phone]

## 2023-09-12 LAB — CYTOLOGY - PAP
Chlamydia: NEGATIVE
Comment: NEGATIVE
Comment: NEGATIVE
Comment: NEGATIVE
Comment: NORMAL
Diagnosis: NEGATIVE
High risk HPV: NEGATIVE
Neisseria Gonorrhea: NEGATIVE
Trichomonas: NEGATIVE

## 2023-09-16 ENCOUNTER — Encounter: Payer: Self-pay | Admitting: Student

## 2023-11-01 ENCOUNTER — Ambulatory Visit
Admission: RE | Admit: 2023-11-01 | Discharge: 2023-11-01 | Disposition: A | Discharge status: Home / Self Care | Source: Ambulatory Visit | Attending: Family Medicine

## 2023-11-01 ENCOUNTER — Ambulatory Visit
Admission: RE | Admit: 2023-11-01 | Discharge: 2023-11-01 | Disposition: A | Discharge status: Home / Self Care | Source: Ambulatory Visit | Attending: Family Medicine | Admitting: Family Medicine

## 2023-11-01 DIAGNOSIS — L0291 Cutaneous abscess, unspecified: Secondary | ICD-10-CM

## 2023-11-01 DIAGNOSIS — N6323 Unspecified lump in the left breast, lower outer quadrant: Secondary | ICD-10-CM | POA: Diagnosis not present

## 2024-01-02 ENCOUNTER — Encounter: Payer: Self-pay | Admitting: *Deleted

## 2024-02-29 ENCOUNTER — Ambulatory Visit

## 2024-02-29 NOTE — Progress Notes (Deleted)
    SUBJECTIVE:   CHIEF COMPLAINT / HPI: HTN, anxiety  ***  PERTINENT  PMH / PSH: HTN, Anxiety, Chiari Malformation  OBJECTIVE:   LMP 10/25/2018   ***  ASSESSMENT/PLAN:   Assessment & Plan  No follow-ups on file.  Stacy Provencal, MD [facility]

## 2024-07-04 ENCOUNTER — Ambulatory Visit (INDEPENDENT_AMBULATORY_CARE_PROVIDER_SITE_OTHER): Admitting: Family Medicine

## 2024-07-04 ENCOUNTER — Encounter: Payer: Self-pay | Admitting: Family Medicine

## 2024-07-04 VITALS — BP 154/91 | HR 97 | Ht 67.0 in | Wt 320.0 lb

## 2024-07-04 DIAGNOSIS — I10 Essential (primary) hypertension: Secondary | ICD-10-CM | POA: Diagnosis not present

## 2024-07-04 DIAGNOSIS — M5410 Radiculopathy, site unspecified: Secondary | ICD-10-CM | POA: Diagnosis present

## 2024-07-04 MED ORDER — HYDROCHLOROTHIAZIDE 50 MG PO TABS: 50.0000 mg | ORAL_TABLET | Freq: Every day | ORAL | 3 refills | Status: DC

## 2024-07-04 MED ORDER — GABAPENTIN 300 MG PO CAPS: 300.0000 mg | ORAL_CAPSULE | Freq: Three times a day (TID) | ORAL | 1 refills | Status: DC

## 2024-07-04 MED ORDER — METOPROLOL SUCCINATE ER 25 MG PO TB24: 75.0000 mg | ORAL_TABLET | Freq: Every day | ORAL | 3 refills | Status: DC

## 2024-07-04 NOTE — Assessment & Plan Note (Signed)
 Uncontrolled. Question adherence after review of pharmacy dispensing records; do not favor changing treatment at this time. - provided refills and instructed pt to take medications daily as prescribed - follow up in 2 weeks for BP recheck

## 2024-07-04 NOTE — Patient Instructions (Addendum)
 It was so good to see you today! Thank you for allowing me to take care of you.  Today we discussed the following concerns and plans:  Pain - can take gabapentin  300 mg up to 3 times a day - continue tylenol  as needed - you can try Lidocaine  patches if this helps - call Dr. Caridad office to schedule an appointment as soon as possible  Blood pressure - continue taking your medicines every day - follow up in 2 weeks to recheck your blood pressure  Headaches - continue tylenol  - call Dr. Cookie office to schedule an appointment as soon as possible  If you have any concerns, please call the clinic or schedule an appointment.  It was a pleasure to take care of you today. Be well!  Lauraine Norse, DO [facility], PGY-2  Do you need your medications delivered to your home?   Well send your prescription to the [facility] for delivery.          Address: [address] [place], [place], [place] [address]          Phone: [phone]  Please call the [facility] to speak with a pharmacist and set up your home medication delivery. If you have any questions, feel free to contact us  -- were happy to help!  Other [facility] that offer affordable prices on both prescriptions and over-the-counter items, as well as convenient services like vaccinations, are  [facility], at [facility]         Address:  [address], [place], [place] [address]         Phone: [phone]  [facility], located in the [facility]        Address: [address] [place], [place] [address]        Phone: [phone]  [facility], at [facility]       Address: [address], [place], [place] [address]       Phone: [phone]  [facility], at [facility]       Address: [address], [place], [place] [address]        Phone: [phone]

## 2024-07-04 NOTE — Progress Notes (Signed)
° ° °  SUBJECTIVE:   CHIEF COMPLAINT / HPI:   Leg pain Left leg pain from low back and radiating down leg.  She has had these symptoms before several years ago and had lumbar radiofrequency nerve ablation with Dr. Constantino at [facility] which provided significant pain relief for some time. Now she tells me her heat went out this past weekend and the cold made pain worse; she fell on Monday (soft surface) due to pain but then had trouble getting up. She may have hit her left shin during the fall but is not sure; she does have a painful area there now. Could not put weight on left foot at all; felt like needles everywhere. Took some gabapentin  which helped. Tylenol  also helps. She would like to return to see Dr. Constantino.  Headache Going on for a while. Intermittent, throbbing over right temple and around eye. No vision changes other than slight blurring after many hours of looking at computer for work. In the last 2 years she feels like she cannot see colors as well. 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 Has previously seen [facility] Dr. Burma for migraines and would be happy to see her again in the future if needed.  Elevated BP Prescribed hydrochlorothiazide  50 mg daily, metoprolol  succinate 75 mg daily which she typically takes without issue, but she did not take her dose this morning.   Also of note, she is a caregiver for her parents. She has been out of work a lot since August. Has significant life stress.  PERTINENT  PMH / PSH:  HTN, Chiari I malformation, scoliosis, hidradenitis suppurativa, HLD  OBJECTIVE:   BP (!) 154/91   Pulse 97   Ht 5' 7 (1.702 m)   Wt (!) 320 lb (145.2 kg)   LMP 10/25/2018   SpO2 99%   BMI 50.12 kg/m   General: well-appearing, no acute distress. HEENT: normocephalic, PERRLA, EOM grossly intact, MMM Cardio: Regular rate, regular rhythm, no murmurs on exam. Pulm: Clear, no wheezing, no crackles. No increased work of  breathing. Abdominal: bowel sounds present, soft, non-tender, non-distended. Extremities: Moves all extremities equally. Left shin with 1.5cm contusion midway down leg, no associated swelling; very tender to palpation. Neuro: CN II: PERRL CN III, IV,VI: EOMI CN VIII: Normal hearing LE strength 5/5 Normal sensation in UE and LE bilaterally    ASSESSMENT/PLAN:   Assessment & Plan Radicular pain of left lower extremity Consistent with prior symptoms. She would likely benefit from seeing Pain Management again to consider further treatment options. Contusion to left shin likely from fall; does not appear to be clot or infection. - refilled gabapentin  300 mg TID PRN - may continue to use tylenol  PRN - recommend OTC lidocaine  patches for contusion to left shin Essential hypertension Uncontrolled. Question adherence after review of pharmacy dispensing records; do not favor changing treatment at this time. - provided refills and instructed pt to take medications daily as prescribed - follow up in 2 weeks for BP recheck   Lauraine Norse, DO [facility]

## 2024-07-11 ENCOUNTER — Other Ambulatory Visit: Payer: Self-pay | Admitting: Family Medicine

## 2024-07-11 ENCOUNTER — Telehealth: Payer: Self-pay

## 2024-07-11 MED ORDER — HYDROCHLOROTHIAZIDE 50 MG PO TABS: 50.0000 mg | ORAL_TABLET | Freq: Every day | ORAL | 3 refills | Status: AC

## 2024-07-11 MED ORDER — METOPROLOL SUCCINATE ER 25 MG PO TB24: 75.0000 mg | ORAL_TABLET | Freq: Every day | ORAL | 3 refills | Status: AC

## 2024-07-11 NOTE — Telephone Encounter (Signed)
 Patient calls nurse line in regards to medications sent to [facility] on 12/11.  She reports she has attempted to pick up prescriptions several times, however [facility] has been closed.  She is asking for prescriptions to [facility] resent [facility] on [address].   I attempted to call [facility], however was not able to get a response.   Will forward to PCP.

## 2024-07-24 ENCOUNTER — Ambulatory Visit: Payer: Self-pay | Admitting: Family Medicine

## 2024-07-24 ENCOUNTER — Telehealth: Payer: Self-pay | Admitting: Family Medicine

## 2024-07-24 ENCOUNTER — Encounter: Payer: Self-pay | Admitting: Family Medicine

## 2024-07-24 VITALS — BP 168/108 | HR 107 | Ht 67.0 in | Wt 325.5 lb

## 2024-07-24 DIAGNOSIS — I10 Essential (primary) hypertension: Secondary | ICD-10-CM

## 2024-07-24 DIAGNOSIS — R3915 Urgency of urination: Secondary | ICD-10-CM | POA: Diagnosis present

## 2024-07-24 DIAGNOSIS — R1012 Left upper quadrant pain: Secondary | ICD-10-CM | POA: Diagnosis not present

## 2024-07-24 LAB — POCT URINE DIPSTICK
Bilirubin, UA: NEGATIVE
Glucose, UA: NEGATIVE mg/dL
Ketones, POC UA: NEGATIVE mg/dL
Leukocytes, UA: NEGATIVE
Nitrite, UA: NEGATIVE
POC PROTEIN,UA: NEGATIVE
Spec Grav, UA: 1.02
Urobilinogen, UA: 0.2 U/dL
pH, UA: 6

## 2024-07-24 LAB — POCT UA - MICROSCOPIC ONLY: WBC, Ur, HPF, POC: NONE SEEN

## 2024-07-24 MED ORDER — OXYCODONE-ACETAMINOPHEN 2.5-325 MG PO TABS: 1.0000 | ORAL_TABLET | ORAL | 0 refills | Status: AC | PRN

## 2024-07-24 MED ORDER — TAMSULOSIN HCL 0.4 MG PO CAPS: 0.4000 mg | ORAL_CAPSULE | Freq: Every day | ORAL | 0 refills | Status: AC

## 2024-07-24 NOTE — Assessment & Plan Note (Signed)
 Poorly controlled, but strongly suspect life stress is contributing. - continue current medication regimen; return in 2 weeks for recheck

## 2024-07-24 NOTE — Progress Notes (Signed)
" ° ° °  SUBJECTIVE:   CHIEF COMPLAINT / HPI:   Left flank pain Non-stop last 3 weeks. She did not seek care sooner due to family/holiday stress.  Has not radiated or changed location in this time. Took some leftover meloxicam and it went away for a brief time. Urinary urgency and frequency, then only goes a trickle. No dysuria. No blood in urine. No fevers/chills. Drinking cranberry juice.  F/u blood pressure Prescribed hydrochlorothiazide  50 mg daily, metoprolol  succinate 75 mg daily. She has been taking medications as prescribed, no missed doses. Family stress, kids home for holidays Has started ashwaganda supplement also.  PERTINENT  PMH / PSH:  HTN, Chiari I malformation, scoliosis, hidradenitis suppurativa, HLD   OBJECTIVE:   BP (!) 168/108   Pulse (!) 107   Ht 5' 7 (1.702 m)   Wt (!) 325 lb 8 oz (147.6 kg)   LMP 10/25/2018   SpO2 97%   BMI 50.98 kg/m   General: well-appearing, no acute distress. HEENT: normocephalic, EOM grossly intact, MMM Cardio: Regular rate, regular rhythm, no murmurs on exam. Pulm: Clear, no wheezing, no crackles. No increased work of breathing. Abdominal: Significant left CVA tenderness. Otherwise bowel sounds present, soft, non-distended. No HSM. Extremities: Moves all extremities equally. Neuro: Alert and oriented x3, speech normal in content, no facial asymmetry.  ASSESSMENT/PLAN:   Assessment & Plan Urgency of urination Left upper quadrant abdominal pain Primarily left flank pain/CVA tenderness. Urine dipstick today without evidence of infection but does show trace blood. Concern for renal stone in this context. - CT stone ordered - flomax 0.4 mg daily - percocet q4h PRN for pain; advised not to take additional tylenol  with this, avoid NSAIDs - recommend BMP for kidney fxn, did not collect today Essential hypertension Poorly controlled, but strongly suspect life stress is contributing. - continue current medication regimen; return  in 2 weeks for recheck    Lauraine Norse, DO [facility] "

## 2024-07-24 NOTE — Patient Instructions (Signed)
 Flank pain - Your urine test does not show any infections but I am concerned you may have a kidney stone; I have ordered at CT scan to get more information - in the meantime start taking Flomax once a day. - I am also send some pain medicine which you can take up to every 4 hours if needed; please use caution as this medicine can make you sleepy. - if pain worsens, you have fever/chills, nausea/vomiting, or any other concerns please seek medical attention right away.  In the meantime continue taking your blood pressure medications as prescribed. Follow up in 2 weeks so we can make sure you are doing well.

## 2024-07-24 NOTE — Telephone Encounter (Signed)
 Reviewed form. Placed in PCP's box to be completed.  Christ Courier, CMA

## 2024-07-29 ENCOUNTER — Telehealth: Payer: Self-pay

## 2024-07-29 NOTE — Telephone Encounter (Signed)
 Patient calls nurse line in regards to Tamsulosin .   She reports she started the medication on 01/01. She reports she takes the medication at bedtime, however when she wakes up the next morning she has a headache.   She reports she has had a headache every day since starting the medication. She reports minimal relief with Tylenol . She reports the headaches last most of the day.   She denies any dizziness, vision changes, shortness of breath or chest pains. No fevers.   Precautions discussed with patient.   Will forward to PCP for advisement.

## 2024-07-30 ENCOUNTER — Other Ambulatory Visit: Payer: Self-pay

## 2024-07-30 NOTE — Telephone Encounter (Signed)
 Pharmacy [facility] [address] faxed stating they do not have oxycodone -acetaminophen  (percocet) in 2.5mg  they would need an alternative.  Harlene Reiter, CMA

## 2024-07-30 NOTE — Telephone Encounter (Signed)
 Attempted to call patient, however no answer.   Please schedule her an apt when she calls back.

## 2024-08-01 ENCOUNTER — Ambulatory Visit
Admission: RE | Admit: 2024-08-01 | Discharge: 2024-08-01 | Disposition: A | Discharge status: Home / Self Care | Source: Ambulatory Visit | Attending: Family Medicine | Admitting: Family Medicine

## 2024-08-01 DIAGNOSIS — R1012 Left upper quadrant pain: Secondary | ICD-10-CM

## 2024-08-04 NOTE — Progress Notes (Unsigned)
 "   SUBJECTIVE:   CHIEF COMPLAINT / HPI:   Follow-up of recent imaging, suspected kidney stone CT Renal Stone study completed 08/01/24: IMPRESSION: 1. No acute abnormality in the abdomen or pelvis. Specifically, no evidence of obstructive uropathy. 2. Clustered micro nodules in the right lung base are favored infectious/inflammatory.  Today she tells me she has still been having pain consistent with prior; in her low back and on the left flank. No urinary or respiratory symptoms. She did take Flomax  but it gave her a headache so she stopped after 3 days. She has seen Dr. Constantino recently and is scheduled for radiofrequency ablation in mid-February to help with her chronic back pain. Also continuing on gabapentin  600 mg TID.  HTN follow-up Poorly controlled at last visit, but significantly stress was likely contributing and patient preferred to keep her current regimen at that time and return for recheck. She has been taking hydrochlorothiazide  50 mg daily, metoprolol  succinate 75 mg daily as prescribed. Also taking ashwaganda.  Migraine Occasionally has issues with this - no symptoms currently. Has previously seen [facility] Dr. Burma for this but has been lost to follow up more recently. Has taken Ubrelvy 100 mg in the past for this with excellent relief and would like to continue this medicine.  Hidradenitis Suppurativa Was previously seeing Dermatology for this and taking PO clindamycin  and another oral medication she cannot recall the name of. She reports these did not work for her and she wants to try something else; would like to be referred to a new dermatologist.  She is also interested in a handicap placard for her car.   PERTINENT  PMH / PSH: reviewed.  OBJECTIVE:   BP 127/71   Pulse 88   Ht 5' 7 (1.702 m)   Wt (!) 316 lb 6.4 oz (143.5 kg)   LMP 10/25/2018   SpO2 99%   BMI 49.56 kg/m   General: well-appearing, no acute distress. HEENT: normocephalic, PERRLA,  EOM grossly intact, MMM Cardio: Regular rate, regular rhythm, no murmurs on exam. Pulm: Clear, no wheezing, no crackles. No increased work of breathing. Abdominal: bowel sounds present, soft, non-distended. No HSM. Extremities: no peripheral edema. Moves all extremities equally. Neuro: Alert and oriented x3, speech normal in content, no facial asymmetry. Psych:  Cognition and judgment appear intact.   ASSESSMENT/PLAN:   Assessment & Plan Chronic midline low back pain with left-sided sciatica Stable; follows with Dr. Constantino at [facility]. Scheduled for radiofrequency ablation in February with possible epidural steroid injection. No red flags on exam today. Reassuringly no stone seen on recent CT. - continue to follow plan per Dr. Constantino - continue gabapentin  600 mg TID Essential hypertension Very well controlled today, repeat BP 127/71. - continue hydrochlorothiazide  50 mg daily, metoprolol  succinate 75 mg daily Migraine without status migrainosus, not intractable, unspecified migraine type Given her history of relief with Holland I agree that she would benefit from restarting this medication, however will defer to Neurologist to rx. - advised patient to make appt to see Neurology as soon as she can; I can place new referral if needed. Hidradenitis suppurativa - referral placed to dermatology per pt request   Additionally, given her significant discomfort with ambulation 2/2 scoliosis and low back pain with sciatica, I think it is reasonable to give temporary handicapped parking placard. I hope that planned future interventions with Dr. Constantino will be helpful in improving her comfort and mobility such that she will not need this accomodation long term. Form  signed and provided to pt today. Copy to be scanned into chart.  Lauraine Norse, DO [facility] "

## 2024-08-05 ENCOUNTER — Encounter: Payer: Self-pay | Admitting: Family Medicine

## 2024-08-05 ENCOUNTER — Ambulatory Visit: Payer: Self-pay | Admitting: Family Medicine

## 2024-08-05 VITALS — BP 127/71 | HR 88 | Ht 67.0 in | Wt 316.4 lb

## 2024-08-05 DIAGNOSIS — G43909 Migraine, unspecified, not intractable, without status migrainosus: Secondary | ICD-10-CM | POA: Diagnosis not present

## 2024-08-05 DIAGNOSIS — I10 Essential (primary) hypertension: Secondary | ICD-10-CM

## 2024-08-05 DIAGNOSIS — G8929 Other chronic pain: Secondary | ICD-10-CM | POA: Diagnosis not present

## 2024-08-05 DIAGNOSIS — L732 Hidradenitis suppurativa: Secondary | ICD-10-CM | POA: Diagnosis not present

## 2024-08-05 DIAGNOSIS — M5442 Lumbago with sciatica, left side: Secondary | ICD-10-CM | POA: Diagnosis present

## 2024-08-05 NOTE — Patient Instructions (Addendum)
 Fortunately your CT scan does not show a kidney stone. Stop taking the Flomax , especially since this has been causing headache.  Continue your gabapentin  500 mg three times a day as prescribed by Dr. Constantino.  Follow up with your Neurologist Dr. Burma to have your Holland refilled for migraines. If you need a referral to a new Neurologist please let me know.  Your blood pressure is under good control today! Continue your current medications.

## 2024-08-05 NOTE — Assessment & Plan Note (Signed)
 Very well controlled today, repeat BP 127/71. - continue hydrochlorothiazide  50 mg daily, metoprolol  succinate 75 mg daily

## 2024-08-06 NOTE — Telephone Encounter (Signed)
 Form completed and given to patient during office visit yesterday.

## 2024-08-17 ENCOUNTER — Other Ambulatory Visit: Payer: Self-pay | Admitting: Family Medicine

## 2024-08-28 ENCOUNTER — Encounter: Payer: Self-pay | Admitting: Family Medicine

## 2024-09-04 ENCOUNTER — Encounter: Admitting: Family Medicine
